# Patient Record
Sex: Male | Born: 1950 | Race: White | Hispanic: No | Marital: Married | Smoking: Former smoker
Health system: Southern US, Community
[De-identification: ages and names within clinical notes are randomized; demographics above are authoritative.]

## PROBLEM LIST (undated history)

## (undated) DIAGNOSIS — IMO0002 Reserved for concepts with insufficient information to code with codable children: Secondary | ICD-10-CM

## (undated) DIAGNOSIS — I2510 Atherosclerotic heart disease of native coronary artery without angina pectoris: Secondary | ICD-10-CM

## (undated) DIAGNOSIS — E785 Hyperlipidemia, unspecified: Secondary | ICD-10-CM

## (undated) DIAGNOSIS — F32A Depression, unspecified: Secondary | ICD-10-CM

## (undated) DIAGNOSIS — G4730 Sleep apnea, unspecified: Secondary | ICD-10-CM

## (undated) DIAGNOSIS — E119 Type 2 diabetes mellitus without complications: Secondary | ICD-10-CM

## (undated) DIAGNOSIS — G2581 Restless legs syndrome: Secondary | ICD-10-CM

## (undated) DIAGNOSIS — M8580 Other specified disorders of bone density and structure, unspecified site: Secondary | ICD-10-CM

## (undated) DIAGNOSIS — F329 Major depressive disorder, single episode, unspecified: Secondary | ICD-10-CM

## (undated) DIAGNOSIS — I714 Abdominal aortic aneurysm, without rupture, unspecified: Secondary | ICD-10-CM

## (undated) DIAGNOSIS — C4491 Basal cell carcinoma of skin, unspecified: Secondary | ICD-10-CM

## (undated) DIAGNOSIS — R1904 Left lower quadrant abdominal swelling, mass and lump: Secondary | ICD-10-CM

## (undated) DIAGNOSIS — I739 Peripheral vascular disease, unspecified: Secondary | ICD-10-CM

## (undated) DIAGNOSIS — K219 Gastro-esophageal reflux disease without esophagitis: Secondary | ICD-10-CM

## (undated) DIAGNOSIS — G709 Myoneural disorder, unspecified: Secondary | ICD-10-CM

## (undated) DIAGNOSIS — K635 Polyp of colon: Secondary | ICD-10-CM

## (undated) HISTORY — DX: Other specified disorders of bone density and structure, unspecified site: M85.80

## (undated) HISTORY — DX: Gastro-esophageal reflux disease without esophagitis: K21.9

## (undated) HISTORY — DX: Atherosclerotic heart disease of native coronary artery without angina pectoris: I25.10

## (undated) HISTORY — DX: Major depressive disorder, single episode, unspecified: F32.9

## (undated) HISTORY — PX: HEMORRHOID SURGERY: SHX153

## (undated) HISTORY — DX: Myoneural disorder, unspecified: G70.9

## (undated) HISTORY — DX: Peripheral vascular disease, unspecified: I73.9

## (undated) HISTORY — DX: Hyperlipidemia, unspecified: E78.5

## (undated) HISTORY — DX: Restless legs syndrome: G25.81

## (undated) HISTORY — PX: NEPHRECTOMY: SHX65

## (undated) HISTORY — DX: Depression, unspecified: F32.A

## (undated) HISTORY — DX: Abdominal aortic aneurysm, without rupture: I71.4

## (undated) HISTORY — DX: Reserved for concepts with insufficient information to code with codable children: IMO0002

## (undated) HISTORY — DX: Type 2 diabetes mellitus without complications: E11.9

## (undated) HISTORY — DX: Sleep apnea, unspecified: G47.30

## (undated) HISTORY — DX: Basal cell carcinoma of skin, unspecified: C44.91

## (undated) HISTORY — DX: Left lower quadrant abdominal swelling, mass and lump: R19.04

## (undated) HISTORY — DX: Polyp of colon: K63.5

## (undated) HISTORY — DX: Abdominal aortic aneurysm, without rupture, unspecified: I71.40

## (undated) HISTORY — PX: ABDOMINAL AORTIC ANEURYSM REPAIR: SUR1152

---

## 2002-01-15 ENCOUNTER — Inpatient Hospital Stay (HOSPITAL_COMMUNITY): Admission: EM | Admit: 2002-01-15 | Discharge: 2002-01-17 | Payer: Self-pay | Admitting: Emergency Medicine

## 2002-01-15 ENCOUNTER — Encounter: Payer: Self-pay | Admitting: Emergency Medicine

## 2002-01-15 ENCOUNTER — Encounter: Payer: Self-pay | Admitting: Family Medicine

## 2002-01-21 ENCOUNTER — Ambulatory Visit (HOSPITAL_COMMUNITY): Admission: RE | Admit: 2002-01-21 | Discharge: 2002-01-21 | Payer: Self-pay | Admitting: Orthopedic Surgery

## 2002-09-08 ENCOUNTER — Ambulatory Visit (HOSPITAL_BASED_OUTPATIENT_CLINIC_OR_DEPARTMENT_OTHER): Admission: RE | Admit: 2002-09-08 | Discharge: 2002-09-08 | Payer: Self-pay | Admitting: Family Medicine

## 2002-10-16 ENCOUNTER — Ambulatory Visit (HOSPITAL_BASED_OUTPATIENT_CLINIC_OR_DEPARTMENT_OTHER): Admission: RE | Admit: 2002-10-16 | Discharge: 2002-10-16 | Payer: Self-pay | Admitting: Family Medicine

## 2009-10-03 DIAGNOSIS — I2510 Atherosclerotic heart disease of native coronary artery without angina pectoris: Secondary | ICD-10-CM

## 2009-10-03 HISTORY — DX: Atherosclerotic heart disease of native coronary artery without angina pectoris: I25.10

## 2009-11-13 ENCOUNTER — Encounter: Admission: RE | Admit: 2009-11-13 | Discharge: 2009-11-13 | Payer: Self-pay | Admitting: Family Medicine

## 2009-11-17 ENCOUNTER — Ambulatory Visit: Payer: Self-pay | Admitting: Vascular Surgery

## 2009-12-01 ENCOUNTER — Encounter: Admission: RE | Admit: 2009-12-01 | Discharge: 2009-12-01 | Payer: Self-pay | Admitting: Vascular Surgery

## 2009-12-01 ENCOUNTER — Ambulatory Visit: Payer: Self-pay | Admitting: Vascular Surgery

## 2009-12-11 ENCOUNTER — Ambulatory Visit (HOSPITAL_COMMUNITY): Admission: RE | Admit: 2009-12-11 | Discharge: 2009-12-11 | Payer: Self-pay | Admitting: Interventional Cardiology

## 2009-12-25 ENCOUNTER — Ambulatory Visit: Payer: Self-pay | Admitting: Vascular Surgery

## 2009-12-25 ENCOUNTER — Encounter: Payer: Self-pay | Admitting: Vascular Surgery

## 2009-12-25 ENCOUNTER — Inpatient Hospital Stay (HOSPITAL_COMMUNITY): Admission: RE | Admit: 2009-12-25 | Discharge: 2009-12-31 | Payer: Self-pay | Admitting: Vascular Surgery

## 2010-01-07 ENCOUNTER — Ambulatory Visit: Payer: Self-pay | Admitting: Vascular Surgery

## 2010-01-18 ENCOUNTER — Ambulatory Visit: Payer: Self-pay | Admitting: Vascular Surgery

## 2010-01-19 ENCOUNTER — Ambulatory Visit: Payer: Self-pay | Admitting: Vascular Surgery

## 2010-03-23 ENCOUNTER — Ambulatory Visit: Payer: Self-pay | Admitting: Vascular Surgery

## 2010-08-19 ENCOUNTER — Ambulatory Visit: Payer: Self-pay | Admitting: Vascular Surgery

## 2010-08-24 ENCOUNTER — Encounter: Admission: RE | Admit: 2010-08-24 | Discharge: 2010-08-24 | Payer: Self-pay | Admitting: Vascular Surgery

## 2010-08-24 ENCOUNTER — Ambulatory Visit: Payer: Self-pay | Admitting: Vascular Surgery

## 2010-09-17 ENCOUNTER — Ambulatory Visit (HOSPITAL_COMMUNITY)
Admission: RE | Admit: 2010-09-17 | Discharge: 2010-09-17 | Payer: Self-pay | Source: Home / Self Care | Attending: Urology | Admitting: Urology

## 2010-10-18 LAB — CBC
HCT: 43.3 % (ref 39.0–52.0)
Hemoglobin: 14 g/dL (ref 13.0–17.0)
MCH: 30.8 pg (ref 26.0–34.0)
MCHC: 32.3 g/dL (ref 30.0–36.0)
MCV: 95.4 fL (ref 78.0–100.0)
Platelets: 200 10*3/uL (ref 150–400)
RBC: 4.54 MIL/uL (ref 4.22–5.81)
RDW: 13.1 % (ref 11.5–15.5)
WBC: 6.5 10*3/uL (ref 4.0–10.5)

## 2010-10-18 LAB — SURGICAL PCR SCREEN
MRSA, PCR: NEGATIVE
Staphylococcus aureus: NEGATIVE

## 2010-10-18 LAB — BASIC METABOLIC PANEL
BUN: 29 mg/dL — ABNORMAL HIGH (ref 6–23)
CO2: 27 mEq/L (ref 19–32)
Calcium: 9.8 mg/dL (ref 8.4–10.5)
Chloride: 105 mEq/L (ref 96–112)
Creatinine, Ser: 1.5 mg/dL (ref 0.4–1.5)
GFR calc Af Amer: 58 mL/min — ABNORMAL LOW (ref >60)
GFR calc non Af Amer: 48 mL/min — ABNORMAL LOW (ref >60)
Glucose, Bld: 103 mg/dL — ABNORMAL HIGH (ref 70–99)
Potassium: 4.8 mEq/L (ref 3.5–5.1)
Sodium: 141 mEq/L (ref 135–145)

## 2010-10-21 ENCOUNTER — Encounter (INDEPENDENT_AMBULATORY_CARE_PROVIDER_SITE_OTHER): Payer: Self-pay | Admitting: Urology

## 2010-10-21 ENCOUNTER — Inpatient Hospital Stay (HOSPITAL_COMMUNITY)
Admission: RE | Admit: 2010-10-21 | Discharge: 2010-10-25 | Payer: Self-pay | Source: Home / Self Care | Attending: Urology | Admitting: Urology

## 2010-10-24 NOTE — Op Note (Signed)
NAME:  Allen Cortez, Allen Cortez NO.:  [id number]  MEDICAL RECORD NO.:  [id number]          PATIENT TYPE:  INP  LOCATION:  [id number]                         FACILITY:  [facility]  PHYSICIAN:  Raynelle Bring, MD      DATE OF BIRTH:  1950-10-11  DATE OF PROCEDURE:  10/21/2010 DATE OF DISCHARGE:                              OPERATIVE REPORT   PREOPERATIVE DIAGNOSIS:  Left renal neoplasm.  POSTOPERATIVE DIAGNOSIS:  Left renal neoplasm.  PROCEDURE:  Left open partial nephrectomy.  SURGEON:  Raynelle Bring, MD  ASSISTANT:  Franco Collet, [facility]  ANESTHESIA:  General.  COMPLICATIONS:  None.  ESTIMATED BLOOD LOSS:  200 cc.  INTRAVENOUS FLUIDS:  3800 cc of crystalloid and 500 cc of colloid.  COMPLICATIONS:  None.  INTRAOPERATIVE FINDINGS: 1. Intraoperative renal tumor margins were negative for malignancy. 2. Cold ischemia time was 30 minutes.  DRAINS: 1. A #15 Blake perinephric drain. 2. A 14-French Foley catheter.  INDICATIONS:  Mr. Bech is a 60 year old gentleman who was incidentally found to have an enhancing left renal neoplasm measuring 1.8 cm concerning for renal malignancy.  He did undergo metastatic evaluation and was found to have multiple small less than 1 cm nodules bilaterally within the lungs.  These findings were discussed with Interventional Radiology who felt that a biopsy was not able to be safely performed. He did not have any evidence of another primary malignancy and therefore was felt that we would proceed to treat his primary renal tumor with plans to follow up his pulmonary nodules.  The potential risks, complications, alternative treatment options were discussed in detail and informed consent was obtained.  DESCRIPTION OF PROCEDURE:  The patient was taken to the operating room and a general anesthetic was administered.  In addition, an epidural was placed for postoperative pain control.  He was placed in the left flank position and prepped and  draped in the usual sterile fashion.  Care was taken to pad all potential pressure points.  He was administered preoperative antibiotics and a preoperative time-out was performed.  An incision was then made from the lateral aspect of the left rectus muscle extending over the eleventh rib to the posterior axillary line and carried down through the subcutaneous tissues.  The incision was carried down over the eleventh rib and the overlying muscle was cleared off the periosteum with the [facility].  A Doyen was then able to be placed underneath the rib and the underlying neurovascular bundle and tissue was removed from the rib.  A rib cutter was then used to remove the distal tip of the rib and the edge of the rib was filed down to a rounded edge.  The overlying abdominal muscular layers were then taken down with electrocautery and the perinephric fat was identified and incised sharply with a knife allowing entry into the retroperitoneum.  The posterior plane was developed off the psoas muscle with combination of blunt and sharp dissection with care to cauterize any small vessels.  The superior pole of the kidney was freed away from the spleen and the peritoneum was carefully pushed away from the anterior aspect of  the kidney, thereby allowing the kidney to be freed. A self-retaining Bookwalter retractor was then placed.  The ureter was identified inferiorly and a vessel loop was placed around for identification purposes.  There were noted to be multiple renal vessels including the small very lower pole renal vessel extending just below the renal pelvis.  This was preserved.  In addition, there were 2 anteriorly located renal arteries and posteriorly located renal artery which were able to all be isolated.  There did appear to be 2 renal veins which were also identified.  Based on the number of renal vessels, it was decided to perform en bloc renal hilar clamping during  the partial nephrectomy.  Gerota's fascia was incised and the perinephric fat was removed from the renal capsule.  The patient's renal tumor was easily identified and was exophytic extending off the posterolateral aspect of the interpolar region of the kidney.  A 12.5 mg of intravenous mannitol was administered and preparations were made for renal hilar clamping.  Once the mannitol had been in appropriate amount of time, a Satinsky clamp was placed over the renal hilum and the kidney was separated from the surrounding tissues with a bowel bag.  Ice slush was then placed around the kidney and the kidney was cooled for 10 minutes. At this point, the ice slush was removed from around the renal tumor and using Metzenbaum scissors, the renal tumor was excised along with a surrounding margin.  Once the tumor was removed, the underlying surface was examined and grossly the margin appeared to be negative.  The additional representative renal parenchymal margins were then sent for pathologic analysis and subsequently returned negative for malignancy. Multiple 4-0 Vicryl figure-of-eight sutures were then placed to control any small renal vessels.  A 2-0 Vicryl running suture was then used to begin to provide renal compression in for additional hemostatic purposes, taking care to ligate any arcuate arteries.  Surgiflo was then placed into the renal tumor defect and a Surgicel bolster was placed into the defect.  This was then able to be secured with 3 separate 2-0 Monocryl renal capsular sutures providing renal compression and securing the Surgicel bolster.  At this point, hemostasis appeared excellent. The renal hilar clamp was removed and hemostasis remained excellent. Total cold ischemia time was 30 minutes.  The renal fossa and the retroperitoneum were then carefully examined and hemostasis was ensured. The retroperitoneum was then carefully irrigated with saline.  A #15 Blake drain was then  brought through a separate stab incision in the left abdominal wall and positioned in the perinephric space.  It was secured to the skin with a nylon suture.  The abdominal wall was then closed in 2 layers with a running #1 PDS suture used to close the lower 2 layers and a separate #1 PDS suture placed to close the external oblique layer.  The superficial wound was then copiously irrigated and 1% lidocaine with epinephrine was used as a local anesthetic in the wound.  The skin was then reapproximated with staples and sterile dressings were applied.  He tolerated the procedure well and without complications.  He was able to be extubated and transferred to recovery unit in satisfactory condition.    Raynelle Bring, MD    LB/MEDQ  D:  10/21/2010  T:  10/21/2010  Job:  [id number]  Electronically Signed by Raynelle Bring MD on 10/24/2010 05:27:42 PM

## 2010-10-25 LAB — BASIC METABOLIC PANEL
BUN: 21 mg/dL (ref 6–23)
BUN: 19 mg/dL (ref 6–23)
CO2: 27 mEq/L (ref 19–32)
CO2: 30 mEq/L (ref 19–32)
Calcium: 8.2 mg/dL — ABNORMAL LOW (ref 8.4–10.5)
Calcium: 8.4 mg/dL (ref 8.4–10.5)
Chloride: 103 mEq/L (ref 96–112)
Chloride: 101 mEq/L (ref 96–112)
Creatinine, Ser: 1.5 mg/dL (ref 0.4–1.5)
Creatinine, Ser: 1.54 mg/dL — ABNORMAL HIGH (ref 0.4–1.5)
GFR calc Af Amer: 58 mL/min — ABNORMAL LOW (ref >60)
GFR calc Af Amer: 56 mL/min — ABNORMAL LOW (ref >60)
GFR calc non Af Amer: 48 mL/min — ABNORMAL LOW (ref >60)
GFR calc non Af Amer: 46 mL/min — ABNORMAL LOW (ref >60)
Glucose, Bld: 147 mg/dL — ABNORMAL HIGH (ref 70–99)
Glucose, Bld: 117 mg/dL — ABNORMAL HIGH (ref 70–99)
Potassium: 4.7 mEq/L (ref 3.5–5.1)
Potassium: 4.6 mEq/L (ref 3.5–5.1)
Sodium: 135 mEq/L (ref 135–145)
Sodium: 138 mEq/L (ref 135–145)

## 2010-10-25 LAB — HEMOGLOBIN AND HEMATOCRIT, BLOOD
HCT: 34.8 % — ABNORMAL LOW (ref 39.0–52.0)
HCT: 36.9 % — ABNORMAL LOW (ref 39.0–52.0)
Hemoglobin: 11.2 g/dL — ABNORMAL LOW (ref 13.0–17.0)
Hemoglobin: 11.9 g/dL — ABNORMAL LOW (ref 13.0–17.0)

## 2010-10-25 LAB — GLUCOSE, CAPILLARY
Glucose-Capillary: 120 mg/dL — ABNORMAL HIGH (ref 70–99)
Glucose-Capillary: 137 mg/dL — ABNORMAL HIGH (ref 70–99)
Glucose-Capillary: 96 mg/dL (ref 70–99)
Glucose-Capillary: 115 mg/dL — ABNORMAL HIGH (ref 70–99)
Glucose-Capillary: 122 mg/dL — ABNORMAL HIGH (ref 70–99)
Glucose-Capillary: 124 mg/dL — ABNORMAL HIGH (ref 70–99)
Glucose-Capillary: 125 mg/dL — ABNORMAL HIGH (ref 70–99)
Glucose-Capillary: 130 mg/dL — ABNORMAL HIGH (ref 70–99)

## 2010-10-25 LAB — CARDIAC PANEL(CRET KIN+CKTOT+MB+TROPI)
CK, MB: 2.5 ng/mL (ref 0.3–4.0)
Relative Index: 0.3 (ref 0.0–2.5)
Total CK: 812 U/L — ABNORMAL HIGH (ref 7–232)
Total CK: 1038 U/L — ABNORMAL HIGH (ref 7–232)
Troponin I: 0.01 ng/mL (ref 0.00–0.06)
Troponin I: 0.02 ng/mL (ref 0.00–0.06)

## 2010-10-25 LAB — TYPE AND SCREEN
ABO/RH(D): O POS
Antibody Screen: NEGATIVE

## 2010-10-25 LAB — ABO/RH: ABO/RH(D): O POS

## 2010-10-26 LAB — GLUCOSE, CAPILLARY
Glucose-Capillary: 85 mg/dL (ref 70–99)
Glucose-Capillary: 95 mg/dL (ref 70–99)
Glucose-Capillary: 96 mg/dL (ref 70–99)
Glucose-Capillary: 101 mg/dL — ABNORMAL HIGH (ref 70–99)
Glucose-Capillary: 115 mg/dL — ABNORMAL HIGH (ref 70–99)

## 2010-10-26 LAB — CARDIAC PANEL(CRET KIN+CKTOT+MB+TROPI)
CK, MB: 1.5 ng/mL (ref 0.3–4.0)
Relative Index: 0.1 (ref 0.0–2.5)
Total CK: 1177 U/L — ABNORMAL HIGH (ref 7–232)

## 2010-10-26 LAB — BASIC METABOLIC PANEL
BUN: 16 mg/dL (ref 6–23)
CO2: 26 mEq/L (ref 19–32)
CO2: 28 mEq/L (ref 19–32)
Calcium: 8.3 mg/dL — ABNORMAL LOW (ref 8.4–10.5)
Calcium: 8.6 mg/dL (ref 8.4–10.5)
Chloride: 105 mEq/L (ref 96–112)
Chloride: 107 mEq/L (ref 96–112)
Creatinine, Ser: 1.45 mg/dL (ref 0.4–1.5)
Creatinine, Ser: 1.44 mg/dL (ref 0.4–1.5)
GFR calc non Af Amer: 49 mL/min — ABNORMAL LOW (ref >60)
Glucose, Bld: 89 mg/dL (ref 70–99)
Glucose, Bld: 115 mg/dL — ABNORMAL HIGH (ref 70–99)
Potassium: 4.2 mEq/L (ref 3.5–5.1)
Sodium: 141 mEq/L (ref 135–145)

## 2010-10-26 LAB — CREATININE, FLUID (PLEURAL, PERITONEAL, JP DRAINAGE)

## 2010-11-27 NOTE — Discharge Summary (Signed)
NAME:  Allen Cortez, Allen Cortez NO.:  [id number]  MEDICAL RECORD NO.:  [id number]          PATIENT TYPE:  INP  LOCATION:  [id number]                         FACILITY:  [facility]  PHYSICIAN:  Raynelle Bring, MD      DATE OF BIRTH:  [birth year]  DATE OF ADMISSION:  10/21/2010 DATE OF DISCHARGE:  10/25/2010                              DISCHARGE SUMMARY   ADMISSION DIAGNOSIS:  Left renal neoplasm.  DISCHARGE DIAGNOSIS:  Left renal cell carcinoma, clear-cell type, furhman grade II/IV, pT1, pNX.  PROCEDURE:  Left open partial nephrectomy.  HISTORY AND PHYSICAL:  For full details, please see admission history and physical.  Briefly, Mr. Wing is a 60 year old gentleman who was incidentally found to have an enhancing left renal neoplasm which measured 1.8 cm, concerning for renal malignancy.  He did undergo metastatic evaluation and was found to have multiple small lesions, less than 1 cm nodules bilaterally within the lungs.  These findings were discussed with interventional radiology who fell that a biopsy was not able to be safely performed.  He did not have any evidence of another primary malignancy and, therefore, it was felt that we would proceed to treat his primary renal tumor with plans to follow up with his pulmonary nodules.  After discussing management options for treatment, he elected to proceed with surgical therapy and an open left partial nephrectomy.  HOSPITAL COURSE:  On October 21, 2010, he was taken to the operating room where he underwent the above named procedure which he tolerated well and without complications.  Postoperatively, he was able to be transferred to a regular hospital room following recovery from anesthesia.  He was found to be hemodynamically stable as noted by postoperative hemoglobin of 11.2.  He was also found to be hemodynamically stable on postoperative day #1 as noted by hemoglobin of 11.9.  His serum creatinine was found to be stable  postoperatively at 1.50.  It did slightly increase to 1.54 on postoperative day #1 but subsequently decreased to 1.44 by postoperative day #2.  On postoperative day #1, he was found to have several episodes of nausea and vomiting with complaints of heartburn.  He did have a history of peripheral vascular disease and an abdominal aortic aneurysm.  He denied any complaints of chest pain or shortness of breath but a set of cardiac enzymes and troponin as well as a 12-lead EKG were obtained.  His 12- lead EKG was normal as well as his cardiac enzymes.  His CK was elevated slightly but this was felt to be secondary to his procedure.  He had no further episodes of nausea and vomiting.  On postoperative day #1, he was able to be given clear liquids.  He was able to be transitioned to regular diet over the course of the next 24-48 hours.  Throughout his hospitalization, he was found to have excellent urine output.  On postoperative day #3, he was also found to have low output from his perinephric drain, therefore, fluid was sent from his perinephric drain to check for creatinine and found to be consistent with serum at 1.4. Therefore his perinephric drain was removed.  He had an epidural catheter placed for pain control postoperatively.  By postoperative day #3, his pain was well managed,  therefore, this after epidural catheter was removed per anesthesia.  He was found to be urinating without difficulty afterwards, therefore, his Foley catheter was removed.  On postoperative day #4, he was ambulating without difficulty.  His pain was well managed and he was tolerating a regular diet.  Therefore. He was felt stable for discharge home as he had met all discharge criteria.  DISPOSITION:  Home.  DISCHARGE INSTRUCTIONS:  He was instructed to be ambulatory but specifically told to refrain from any heavy lifting, strenuous activity or driving.  He was instructed to resume a regular diet.  DISCHARGE  MEDICATIONS:  He was instructed to resume all home medications consisting of Prilosec, Celexa, simvastatin, lisinopril and Klonopin. He was instructed to hold his multivitamins and aspirin for 1 week.  In addition, he was provided a prescription for Vicodin to take as needed for pain and told to use Colace as a stool softener.  FOLLOWUP:  He will follow up in 10-14 days for further postoperative evaluation.  PATHOLOGY:  His pathology returned as a renal cell carcinoma, clear-cell type, firm and grade II/IV, pT1, pNX.  Dictated For:  Raynelle Bring, MD     Franco Collet, NP   ______________________________ Raynelle Bring, MD    MA/MEDQ  D:  11/01/2010  T:  11/01/2010  Job:  [id number]  Electronically Signed by Franco Collet NP on 11/21/2010 08:18:32 PM Electronically Signed by Raynelle Bring MD on 11/27/2010 01:16:31 PM

## 2010-12-26 LAB — GLUCOSE, CAPILLARY
Glucose-Capillary: 106 mg/dL — ABNORMAL HIGH (ref 70–99)
Glucose-Capillary: 106 mg/dL — ABNORMAL HIGH (ref 70–99)
Glucose-Capillary: 108 mg/dL — ABNORMAL HIGH (ref 70–99)
Glucose-Capillary: 108 mg/dL — ABNORMAL HIGH (ref 70–99)
Glucose-Capillary: 109 mg/dL — ABNORMAL HIGH (ref 70–99)
Glucose-Capillary: 118 mg/dL — ABNORMAL HIGH (ref 70–99)
Glucose-Capillary: 127 mg/dL — ABNORMAL HIGH (ref 70–99)
Glucose-Capillary: 138 mg/dL — ABNORMAL HIGH (ref 70–99)
Glucose-Capillary: 142 mg/dL — ABNORMAL HIGH (ref 70–99)
Glucose-Capillary: 154 mg/dL — ABNORMAL HIGH (ref 70–99)
Glucose-Capillary: 158 mg/dL — ABNORMAL HIGH (ref 70–99)

## 2010-12-26 LAB — CBC
HCT: 30.3 % — ABNORMAL LOW (ref 39.0–52.0)
HCT: 32.7 % — ABNORMAL LOW (ref 39.0–52.0)
HCT: 33.9 % — ABNORMAL LOW (ref 39.0–52.0)
HCT: 41.8 % (ref 39.0–52.0)
Hemoglobin: 10.2 g/dL — ABNORMAL LOW (ref 13.0–17.0)
Hemoglobin: 11 g/dL — ABNORMAL LOW (ref 13.0–17.0)
MCHC: 33.2 g/dL (ref 30.0–36.0)
MCHC: 33.5 g/dL (ref 30.0–36.0)
MCHC: 34 g/dL (ref 30.0–36.0)
MCHC: 34 g/dL (ref 30.0–36.0)
MCV: 91.9 fL (ref 78.0–100.0)
MCV: 91.9 fL (ref 78.0–100.0)
MCV: 92.5 fL (ref 78.0–100.0)
MCV: 92.5 fL (ref 78.0–100.0)
Platelets: 133 10*3/uL — ABNORMAL LOW (ref 150–400)
Platelets: 145 10*3/uL — ABNORMAL LOW (ref 150–400)
RBC: 3.27 MIL/uL — ABNORMAL LOW (ref 4.22–5.81)
RBC: 3.69 MIL/uL — ABNORMAL LOW (ref 4.22–5.81)
RBC: 4.55 MIL/uL (ref 4.22–5.81)
RDW: 13.6 % (ref 11.5–15.5)
RDW: 13.6 % (ref 11.5–15.5)
WBC: 11.4 10*3/uL — ABNORMAL HIGH (ref 4.0–10.5)
WBC: 6.9 10*3/uL (ref 4.0–10.5)

## 2010-12-26 LAB — URINALYSIS, ROUTINE W REFLEX MICROSCOPIC
Bilirubin Urine: NEGATIVE
Glucose, UA: NEGATIVE mg/dL
Hgb urine dipstick: NEGATIVE
Specific Gravity, Urine: 1.013 (ref 1.005–1.030)
Urobilinogen, UA: 0.2 mg/dL (ref 0.0–1.0)

## 2010-12-26 LAB — BLOOD GAS, ARTERIAL
Acid-Base Excess: 1 mmol/L (ref 0.0–2.0)
Bicarbonate: 24.5 mEq/L — ABNORMAL HIGH (ref 20.0–24.0)
Bicarbonate: 27.1 mEq/L — ABNORMAL HIGH (ref 20.0–24.0)
Patient temperature: 98.6
Patient temperature: 98.6
TCO2: 25.8 mmol/L (ref 0–100)
TCO2: 28.9 mmol/L (ref 0–100)
pH, Arterial: 7.403 (ref 7.350–7.450)
pO2, Arterial: 86.5 mmHg (ref 80.0–100.0)

## 2010-12-26 LAB — AMYLASE: Amylase: 103 U/L (ref 0–105)

## 2010-12-26 LAB — COMPREHENSIVE METABOLIC PANEL
ALT: 10 U/L (ref 0–53)
AST: 19 U/L (ref 0–37)
AST: 19 U/L (ref 0–37)
Albumin: 2.5 g/dL — ABNORMAL LOW (ref 3.5–5.2)
BUN: 18 mg/dL (ref 6–23)
CO2: 23 mEq/L (ref 19–32)
Calcium: 7.8 mg/dL — ABNORMAL LOW (ref 8.4–10.5)
Calcium: 9.6 mg/dL (ref 8.4–10.5)
Chloride: 110 mEq/L (ref 96–112)
Creatinine, Ser: 1.28 mg/dL (ref 0.4–1.5)
Creatinine, Ser: 1.3 mg/dL (ref 0.4–1.5)
GFR calc Af Amer: >60 mL/min (ref >60)
GFR calc Af Amer: >60 mL/min (ref >60)
GFR calc non Af Amer: 56 mL/min — ABNORMAL LOW (ref >60)
GFR calc non Af Amer: 57 mL/min — ABNORMAL LOW (ref >60)
Glucose, Bld: 114 mg/dL — ABNORMAL HIGH (ref 70–99)
Sodium: 136 mEq/L (ref 135–145)
Total Bilirubin: 0.4 mg/dL (ref 0.3–1.2)
Total Protein: 4.5 g/dL — ABNORMAL LOW (ref 6.0–8.3)

## 2010-12-26 LAB — BASIC METABOLIC PANEL
BUN: 13 mg/dL (ref 6–23)
BUN: 16 mg/dL (ref 6–23)
CO2: 26 mEq/L (ref 19–32)
CO2: 28 mEq/L (ref 19–32)
Calcium: 8 mg/dL — ABNORMAL LOW (ref 8.4–10.5)
Chloride: 107 mEq/L (ref 96–112)
Chloride: 109 mEq/L (ref 96–112)
Creatinine, Ser: 1.13 mg/dL (ref 0.4–1.5)
GFR calc Af Amer: >60 mL/min (ref >60)
GFR calc Af Amer: >60 mL/min (ref >60)
Glucose, Bld: 145 mg/dL — ABNORMAL HIGH (ref 70–99)
Potassium: 3.9 mEq/L (ref 3.5–5.1)
Potassium: 4.2 mEq/L (ref 3.5–5.1)
Sodium: 137 mEq/L (ref 135–145)

## 2010-12-26 LAB — ABO/RH: ABO/RH(D): O POS

## 2010-12-26 LAB — POCT I-STAT 7, (LYTES, BLD GAS, ICA,H+H)
Calcium, Ion: 1.12 mmol/L (ref 1.12–1.32)
HCT: 30 % — ABNORMAL LOW (ref 39.0–52.0)
Patient temperature: 36.2
Potassium: 4.2 mEq/L (ref 3.5–5.1)
pCO2 arterial: 37.6 mmHg (ref 35.0–45.0)
pH, Arterial: 7.448 (ref 7.350–7.450)
pO2, Arterial: 359 mmHg — ABNORMAL HIGH (ref 80.0–100.0)

## 2010-12-26 LAB — PROTIME-INR
INR: 0.97 (ref 0.00–1.49)
Prothrombin Time: 15.3 seconds — ABNORMAL HIGH (ref 11.6–15.2)

## 2010-12-26 LAB — CROSSMATCH
ABO/RH(D): O POS
Antibody Screen: NEGATIVE

## 2010-12-26 LAB — MRSA PCR SCREENING: MRSA by PCR: NEGATIVE

## 2011-01-19 ENCOUNTER — Other Ambulatory Visit: Payer: Self-pay | Admitting: Family Medicine

## 2011-01-19 DIAGNOSIS — R51 Headache: Secondary | ICD-10-CM

## 2011-01-19 DIAGNOSIS — H538 Other visual disturbances: Secondary | ICD-10-CM

## 2011-01-20 ENCOUNTER — Ambulatory Visit
Admission: RE | Admit: 2011-01-20 | Discharge: 2011-01-20 | Disposition: A | Discharge status: Home / Self Care | Payer: PRIVATE HEALTH INSURANCE | Source: Ambulatory Visit | Attending: Family Medicine | Admitting: Family Medicine

## 2011-01-20 DIAGNOSIS — H538 Other visual disturbances: Secondary | ICD-10-CM

## 2011-01-20 DIAGNOSIS — R51 Headache: Secondary | ICD-10-CM

## 2011-02-15 NOTE — Assessment & Plan Note (Signed)
OFFICE VISIT   [name], [name]  DOB:  Sep 12, 1951                                       08/24/2010  [id number]   The patient returns today having had a CT angiogram performed today to  rule out a ventral hernia because of some lower abdominal discomfort.  I  performed an aortobi-iliac graft on him in March 2011 for an abdominal  aortic aneurysm.  He has also been having some lower back discomfort.  He states that since wearing the abdominal binder, his abdominal  symptoms have eased up while at work.   On exam today blood pressure 146/91, heart rate is 83, respirations 16.  Chest s clear to auscultation.  Cardiovascular:  Regular rhythm, no  murmurs.  Abdomen is soft, nontender.  There is no evidence of ventral  hernia on physical exam.  He does have some tenderness around the  periumbilical region.  He has 3+ femoral pulses bilaterally.   Today I reviewed the CT scan and discussed it with Dr. Chauncey Cruel by  phone.  The patient has a mass in his left kidney approximately 1.5 cm  in diameter, suspicious for malignancy.  He also has a low-attenuated  area in the right lobe of the liver measuring 1.2 cm in diameter, the  etiology of which is unknown.  He will need an MR scan with attenuation  most likely, and we are referring him to [facility] for further  evaluation of this left renal mass and possible liver mass.  There is no  evidence of ventral hernia and I discussed this with him, and he will  return to see [place] on a p.r.n. basis.     Nelda Severe Kellie Simmering, M.D.  Electronically Signed   JDL/MEDQ  D:  08/24/2010  T:  08/25/2010  Job:  [id number]

## 2011-02-15 NOTE — Assessment & Plan Note (Signed)
OFFICE VISIT   OCTAVE, WOODROME  DOB:  1951/05/03                                       12/01/2009  [id number]   The patient returns today to further discuss his infrarenal abdominal  aortic aneurysm which I an in the process of evaluating having seen him  on February 15.  The aneurysm is 5.5 x 5.6.  I ordered a CT angiogram  which I reviewed today.  This reveals the infrarenal aneurysm to extend  up to just below the left and right renal arteries.  There is a 90  degree angulation to the patient's left side and he is not a good  candidate for stent grafting.  The patient also does not request stent  graft and would like an open repair.  He has also had a Cardiolite  procedure performed.  This revealed some mild ischemic changes.  I  discussed this with Dr. Leonia Reeves today and the patient has an appointment  with Dr. Irish Lack on this coming Monday to discuss cardiac  catheterization preoperatively to rule out any significant lesions prior  to his open aneurysm resection.   Today on exam blood pressure is 153/93, heart rate is 85, respirations  14.  His abdomen is soft, nontender with a pulsatile mass approximating  5.5 cm.  He has 3+ femoral and posterior tibial pulses bilaterally.   The patient will be back in touch with me as soon as the cardiac cath  has been performed so that we can then make decision regarding open  resection and grafting of this aneurysm with aortoiliac bypass grafting.     Nelda Severe Kellie Simmering, M.D.  Electronically Signed   JDL/MEDQ  D:  12/01/2009  T:  12/02/2009  Job:  [id number]

## 2011-02-15 NOTE — Assessment & Plan Note (Signed)
OFFICE VISIT   Allen Cortez, Allen Cortez  DOB:  Dec 14, 1950                                       03/23/2010  [id number]   The patient returns for continued follow-up regarding abdominal aortic  aneurysm resection with insertion of aortobifemoral bypass graft  performed by me on March 25.  He has done very well over the last 2  months with improvement in his appetite and he is beginning to gain his  preoperative weight back.  He is ambulating without claudication having  good bowel movements and has no specific complaints.   On exam his blood pressure today is 166/89, heart rate 67, temperature  98.  Abdominal incision is well-healed.  No evidence of ventral hernia.  Inguinal incisions are healed with 3+ femoral, popliteal and posterior  tibial pulses bilaterally.  Both feet are well-perfused.   I think he is doing very well.  It is okay to return to work as of  Monday the 27th of June.  He will return to see [place] on a p.r.n. basis.     Nelda Severe Kellie Simmering, M.D.  Electronically Signed   JDL/MEDQ  D:  03/23/2010  T:  03/23/2010  Job:  [id number]

## 2011-02-15 NOTE — Assessment & Plan Note (Signed)
OFFICE VISIT   RAIMUNDO, SCHOMBERG  DOB:  04-23-1951                                       08/19/2010  [id number]   HISTORY OF PRESENT ILLNESS:  Patient is a 60 year old gentleman who had  an open repair of infrarenal abdominal aortic aneurysm in March 2011.  Since that time, he has noted some pain and burning around the umbilical  area near the incision of the abdominal wound.  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  He has been lifting at least 50-75 pounds several  times a day.  He notes sharp pain with turning.  He notes burning in the  area.  He did not note any increasing fullness to the area or constant  pain.  He states over the last several weeks with increasing his work  load, he has been having more constant pain but denies any enlargement  of the area of the wound.  He states he has most of his pain with  movement and coughing.  Otherwise he has no new medical issues and is on  no new medications.   PAST MEDICAL HISTORY:  Significant for GERD, coronary artery disease,  hypercholesterolemia, and depression.   PHYSICAL EXAMINATION:  This is a well-developed, well-nourished  gentleman in no acute distress.  His heart rate is 86.  His O2 sats were  99%.  His respiratory is 10.  Abdomen is soft.  He is tender around the  umbilical area to palpation.  No masses are felt.  There is nothing  reducible in that area.  At the base of the umbilicus, there is an area  the size of a nickel, which may be dehiscence of the fascial layer, but  it is small.  There is no nothing protruding through this area.  The  abdominal wound is well-healed.  There is no redness or other issues  with the wound.   ASSESSMENT:  1. Possible incisional hernia in the midline abdominal incision near      the umbilicus.  2. Muscle pain secondary to heavy lifting in the area around a midline      abdominal incision.   PLAN:  1. This was discussed with Dr. Oneida Alar, and  it was felt that a CT scan      to rule out hernia and then follow up with Dr. Kellie Simmering.  2. The patient is to wear an abdominal binder to assist with support      of the abdominal wall while he is working.   Wray Kearns, PA-C   Gianno E. Fields, MD  Electronically Signed   RR/MEDQ  D:  08/19/2010  T:  08/19/2010  Job:  [id number]

## 2011-02-15 NOTE — Consult Note (Signed)
NEW PATIENT CONSULTATION   Allen Cortez, Allen Cortez  DOB:  1951/02/06                                       11/17/2009  [id number]   The patient is a 60 year old male patient referred by Dr. Azalia Bilis  for an infrarenal abdominal aortic aneurysm which was discovered last  week on a lifeline screening exam.  He had a repeat formal ultrasound  study performed which revealed a 5.5 x 5.6 cm infrarenal aortic  aneurysm.  He has no history of abdominal or back symptoms other than  chronic mechanical back pain and had no previous knowledge of the  aneurysm.   CHRONIC STABLE MEDICAL PROBLEMS:  1. Hypertension.  2. Hyperlipidemia.  3. Pre diabetes.  4. Negative for coronary artery disease, CVA or COPD.   PAST SURGICAL HISTORY:  1. Hemorrhoidectomy.  2. Vasectomy.   FAMILY HISTORY:  Positive for diabetes and cerebral aneurysm in his  mother.  Negative for coronary artery disease.   SOCIAL HISTORY:  He is married, has one child, works as a Games developer.  Has smoked 1-2 packs cigarettes per day for 40+ years and currently  smokes 1/2 pack cigarettes per  day and is trying to quit.  Does not use  alcohol.   REVIEW OF SYSTEMS:  Negative for chest pain, dyspnea on exertion, has  reflux esophagitis on occasion.  No severe claudication.  Has had some  left frontal headaches with no neurologic symptoms.  Has arthritis,  muscle pain, depression.  All other systems in the 14 point review of  systems are negative.   PHYSICAL EXAMINATION:  Vital signs:  Blood pressure 130/88, heart rate  70, temperature 98.  General:  He is a well-developed, well-nourished  male who is in no apparent distress, alert and oriented x3.  HEENT:  EOMs intact, conjunctivae normal.  Chest:  Clear to auscultation.  No  wheezing.  Cardiovascular:  Regular rhythm.  No murmurs.  Cardiovascular  exam also includes 3+ carotid pulses with no audible bruits in the neck.  No palpable adenopathy in the  neck.  Abdomen:  Soft, nontender with a  pulsatile mass in mid epigastric approximating 5-6 cm.  This is  nontender.  He has 3+ femoral, popliteal and dorsalis pedis pulses  bilaterally.  Musculoskeletal:  No major deformities.  Skin:  Free of  rashes.   I reviewed the reports of the ultrasound study and also the clinical  information supplied by Dr. Kenton Kingfisher.  I also discussed with the patient  options including stent grafting and open repair.  He has researched  this and states that he would definitely like to have open repair.   I have ordered a Cardiolite exam to rule out silent coronary artery  disease and also a CT angiogram and he will return in 2 weeks to discuss  this.  I will then give him the specific options of stent grafting and  open repair if stent grafting is an option and he can make final  decision.     Nelda Severe Kellie Simmering, M.D.  Electronically Signed   JDL/MEDQ  D:  11/17/2009  T:  11/18/2009  Job:  [id number]   cc:   Azalia Bilis, M.D.

## 2011-02-15 NOTE — Assessment & Plan Note (Signed)
OFFICE VISIT   Allen Cortez, Allen Cortez  DOB:  01-15-1951                                       01/19/2010  [id number]   The patient had abdominal aortic aneurysm resection performed on March  25.  He did have some problems postoperatively with paranoia and  confusion which was clearing at the time of discharge.  Since his  discharge he has gradually increased his activity level but is not  ambulating much outside.  His appetite is improving and his weight seems  to have stabilized.  He is having no nausea and vomiting.  His abdominal  soreness is also improving.  His mental status has completely cleared.   On exam today blood pressure 153/92, heart rate 69, temperature 97.8.  Abdominal incision is well-healed.  No evidence of ventral hernia.  He  has 3+ femoral, popliteal and posterior tibial pulses, well-perfused  lower extremities with no edema.   I have reassured him regarding these findings.  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     Nelda Severe Kellie Simmering, M.D.  Electronically Signed   JDL/MEDQ  D:  01/19/2010  T:  01/20/2010  Job:  [id number]

## 2011-03-01 IMAGING — CT CT ANGIO PELVIS
2 of 4 series · 12 of 36 positions shown, 17 images · IV contrast ([ID] [id number])
Comparison: Ultrasound of the abdomen of 11/13/2009

CTA ABDOMEN

CLINICAL DATA: Abdominal aortic aneurysm, for pre stent graft
measurements

CT ANGIOGRAPHY OF ABDOMEN AND PELVIS - PRESTENT PROTOCOL
TECHNIQUE: Multidetector CT imaging of the abdomen and pelvis was
performed during bolus injection of intravenous contrast.
Multiplanar CT angiographic image reconstructions including MIPs
were also generated to evaluate the vascular anatomy.
Contrast:  100 ml 5mnipaque-RII

[Series 5: angio · axial · 0.70mm/px · z∈[-430,-40]mm · 11 of 180 slices shown, 15 images]
[im 12/180  soft-tissue]
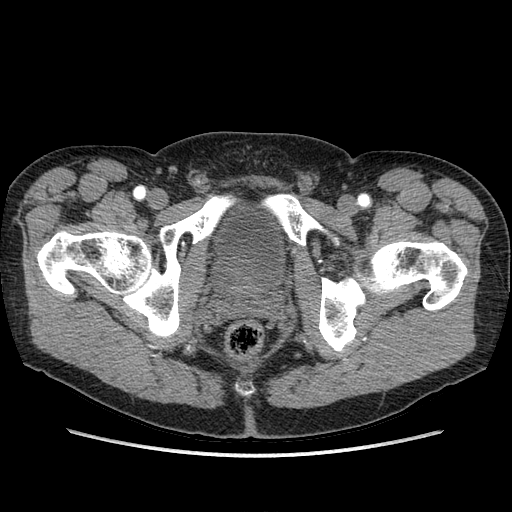
[im 12/180  bone]
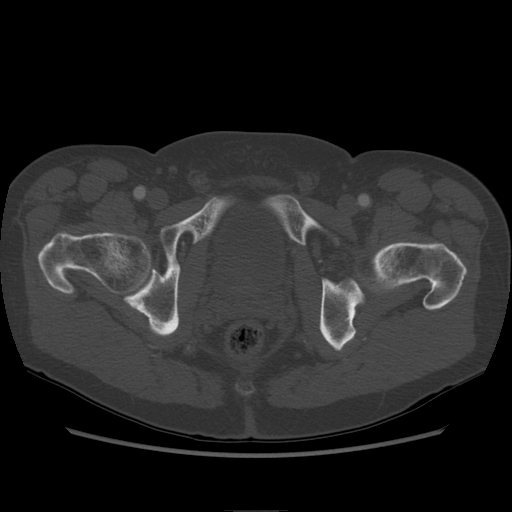
[im 34/180  soft-tissue]
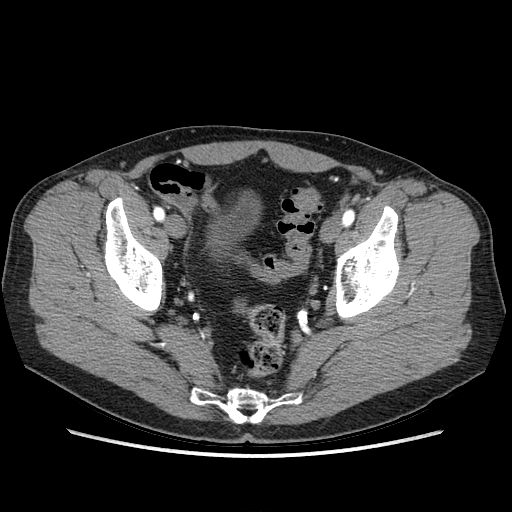
[im 56/180  soft-tissue]
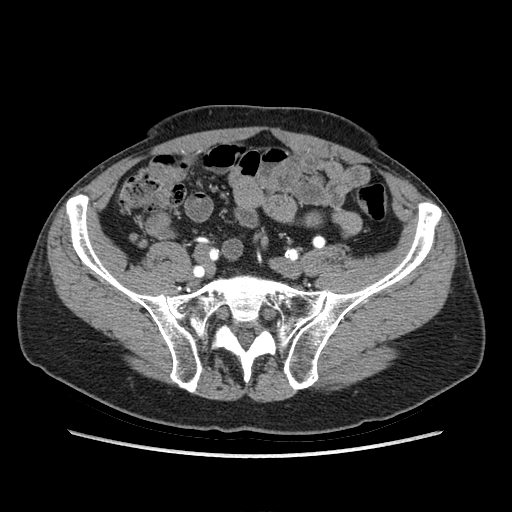
[im 68/180  soft-tissue]
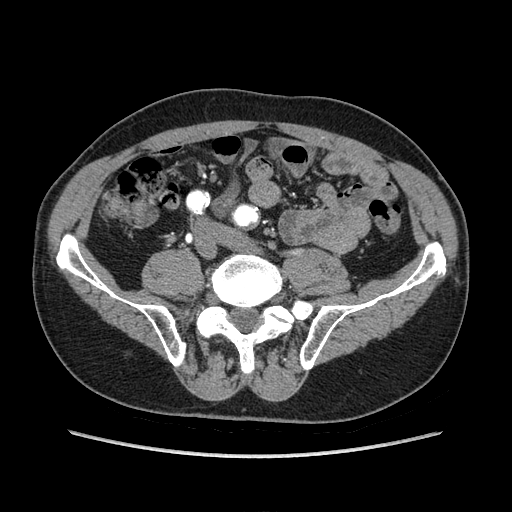
[im 90/180  soft-tissue]
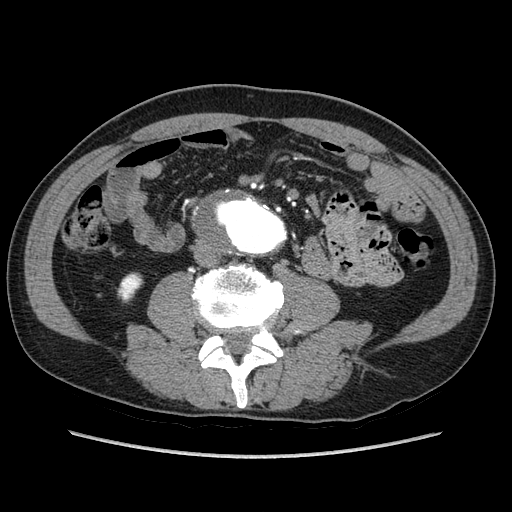
[im 112/180  soft-tissue]
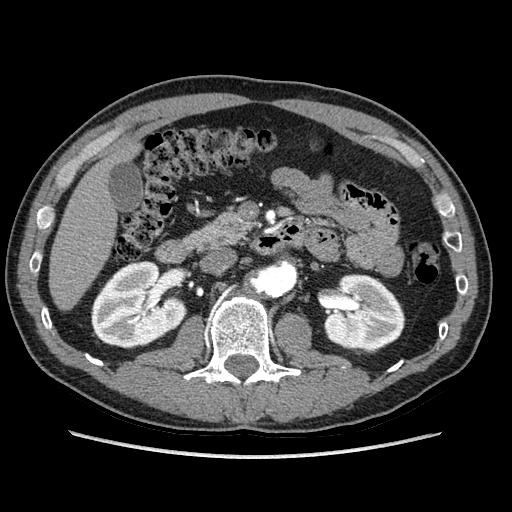
[im 124/180  soft-tissue]
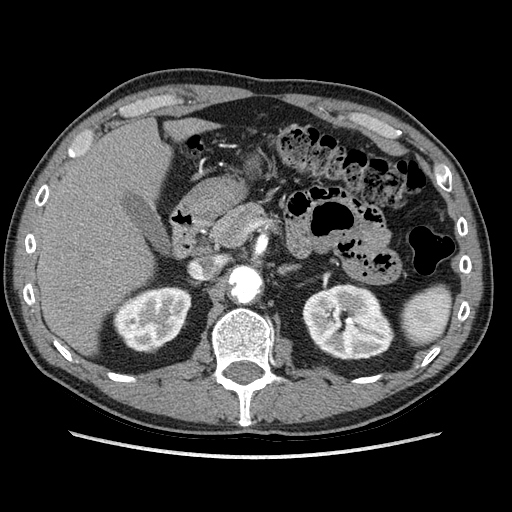
[im 135/180  lung]
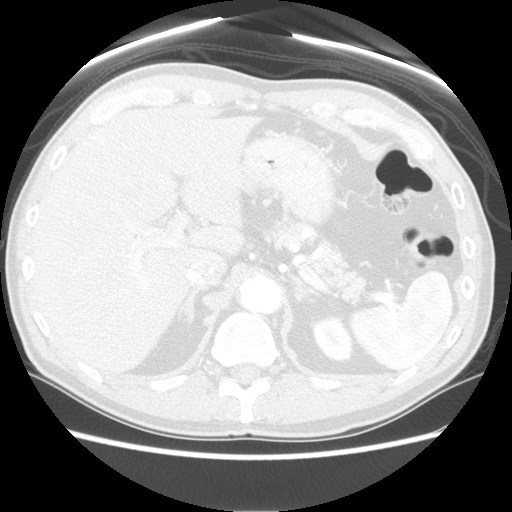
[im 146/180  soft-tissue]
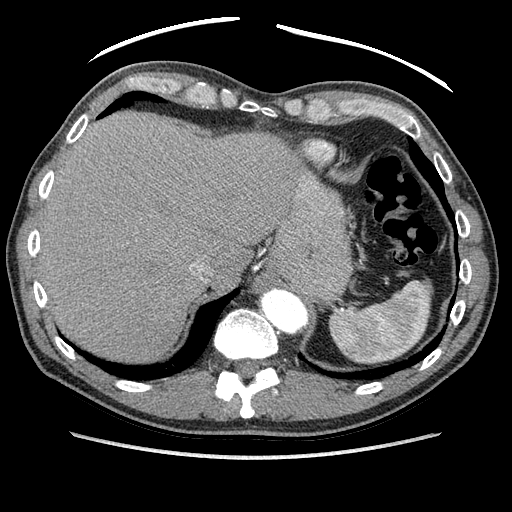
[im 146/180  lung]
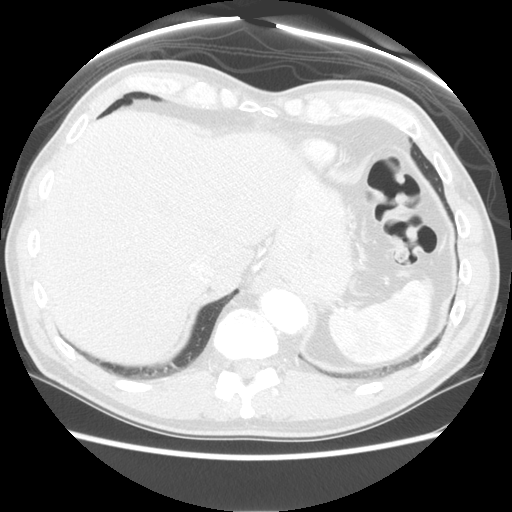
[im 157/180  lung]
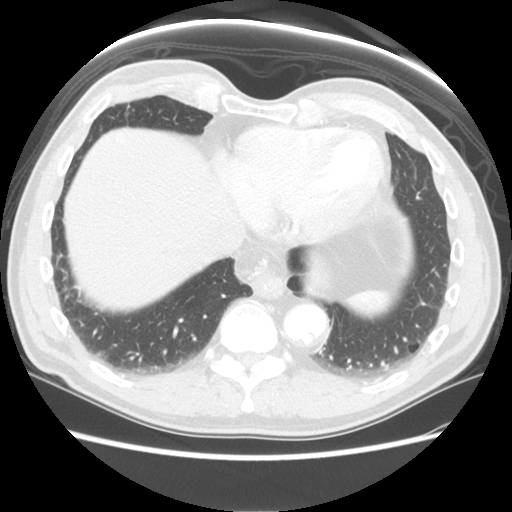
[im 168/180  soft-tissue]
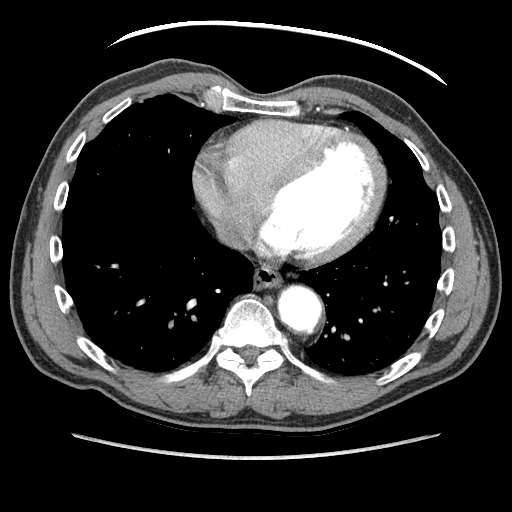
[im 168/180  lung]
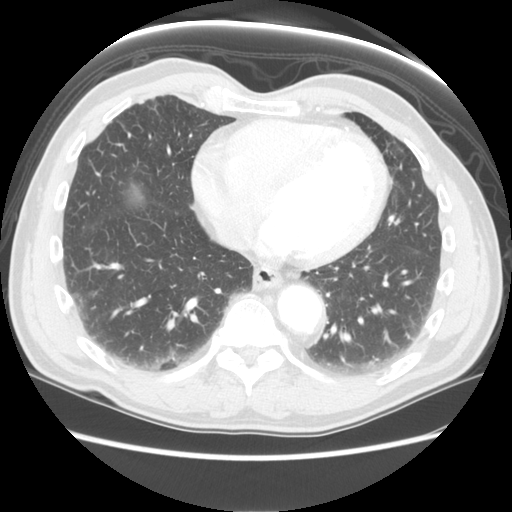
[im 168/180  bone]
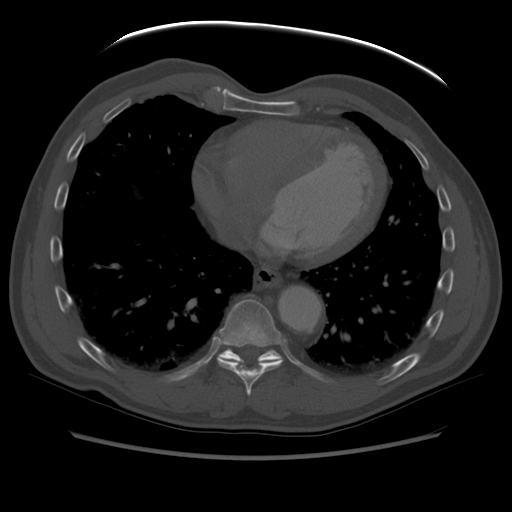

[Series 601: coronal body · coronal · 0.96mm/px · 1 of 111 slices shown, 2 images]
[im 37/111  soft-tissue]
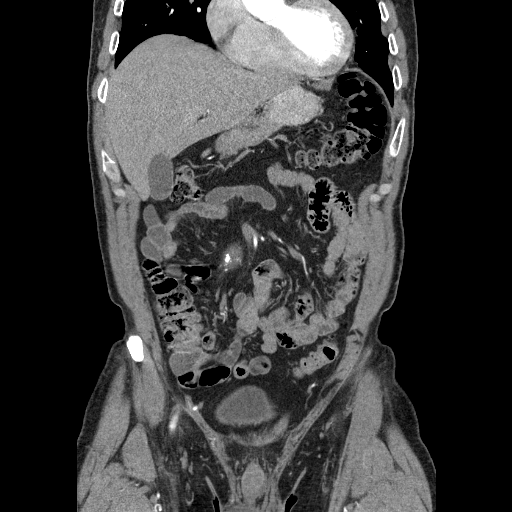
[im 37/111  bone]
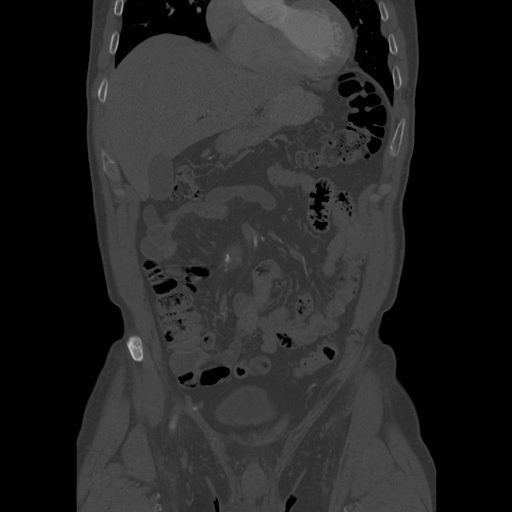

[12 of 36 positions shown; findings below may reference images not displayed]

FINDINGS: Chronic changes are noted at the lung bases  There is a
poorly defined low attenuation in the posterior right lobe of liver
which may represent hemangioma but is difficult to evaluate on this
arterial phase study.  Tiny enhancing focus in the periphery of the
left lobe may represent a small hemangioma as well.  No ductal
dilatation is seen.  No calcified gallstones are noted.  The
pancreas is normal in size and the pancreatic duct is not dilated.
The adrenal glands and spleen are unremarkable.  The gastric wall
is somewhat prominent and appears to enhance, but the stomach is
not distended and this finding is of questionable significance.  If
there are symptoms referable to the the stomach then further
assessment may be warranted.  The kidneys enhance with no
significant abnormality noted.  Significant atheromatous change is
noted throughout the entire abdominal aorta.  The origins of the
celiac axis and SMA are patent and there are single renal arteries
bilaterally.  The immediately infrarenal abdominal aorta is noted
to be very ectatic, with an angulation of at least 50-60 degrees.
The following pre stent graft measurements were obtained.

Length of infrarenal neck (from lowest renal artery): 37 mm
Number of renal arteries:  Right = 1; Left = 1
Diameter of infrarenal neck:  21 mm
Total length of aneurysm:  111 mm
Aneurysm ends at aortic bifurcation: Yes  If no, Distance from
aneurysm to bifurcation:
Greatest aneurysm diameter:  59 mmGreatest common iliac artery
diameters:  Right =16 mm; Left = 18 mm
Diameter of common iliac arteries just above iliac bifurcation:
Right = 13 mm; Left = 14 mm
Length of common iliac arteries:  Right = 21 mm; Left = 33 mm

 Review of the MIP images confirms the above findings.
IMPRESSION: 1.  Infrarenal abdominal aortic aneurysm with maximal diameter of
59 mm, terminating at the bifurcation.

2.  Very ectatic infrarenal abdominal aorta with angulation of at
least 50-60 degrees.  Atheromatous change also involves the
suprarenal and immediately infrarenal abdominal aorta above the
aneurysm.
3.  Somewhat prominent gastric wall which is of questionable
significance in view of the lack of distention.  Correlate
clinically.

CTA PELVIS
FINDINGS: There is fusiform dilatation of the common iliac
arteries, left slightly more prominent than right.  No focal
aneurysm is seen.  Both internal and external iliac arteries are
widely patent.  The urinary bladder is unremarkable.  There does
appear to be a posterior left urinary bladder diverticulum which
contains several small calculi.  The ureters are normal in caliber.
The prostate is normal in size.  No bony abnormality is seen.

 Review of the MIP images confirms the above findings.
IMPRESSION: 1.  Slight fusiform dilatation of the common iliac arteries.  No
focal aneurysm.
2.  Left posterior urinary bladder diverticulum which appears to
contain small calculi.

## 2011-03-25 IMAGING — CR DG CHEST 1V PORT
1 series · 1 of 1 positions shown · non-contrast
Comparison: None

CLINICAL DATA: Abdominal aortic aneurysm repair

PORTABLE CHEST - 1 VIEW

[view not recorded]
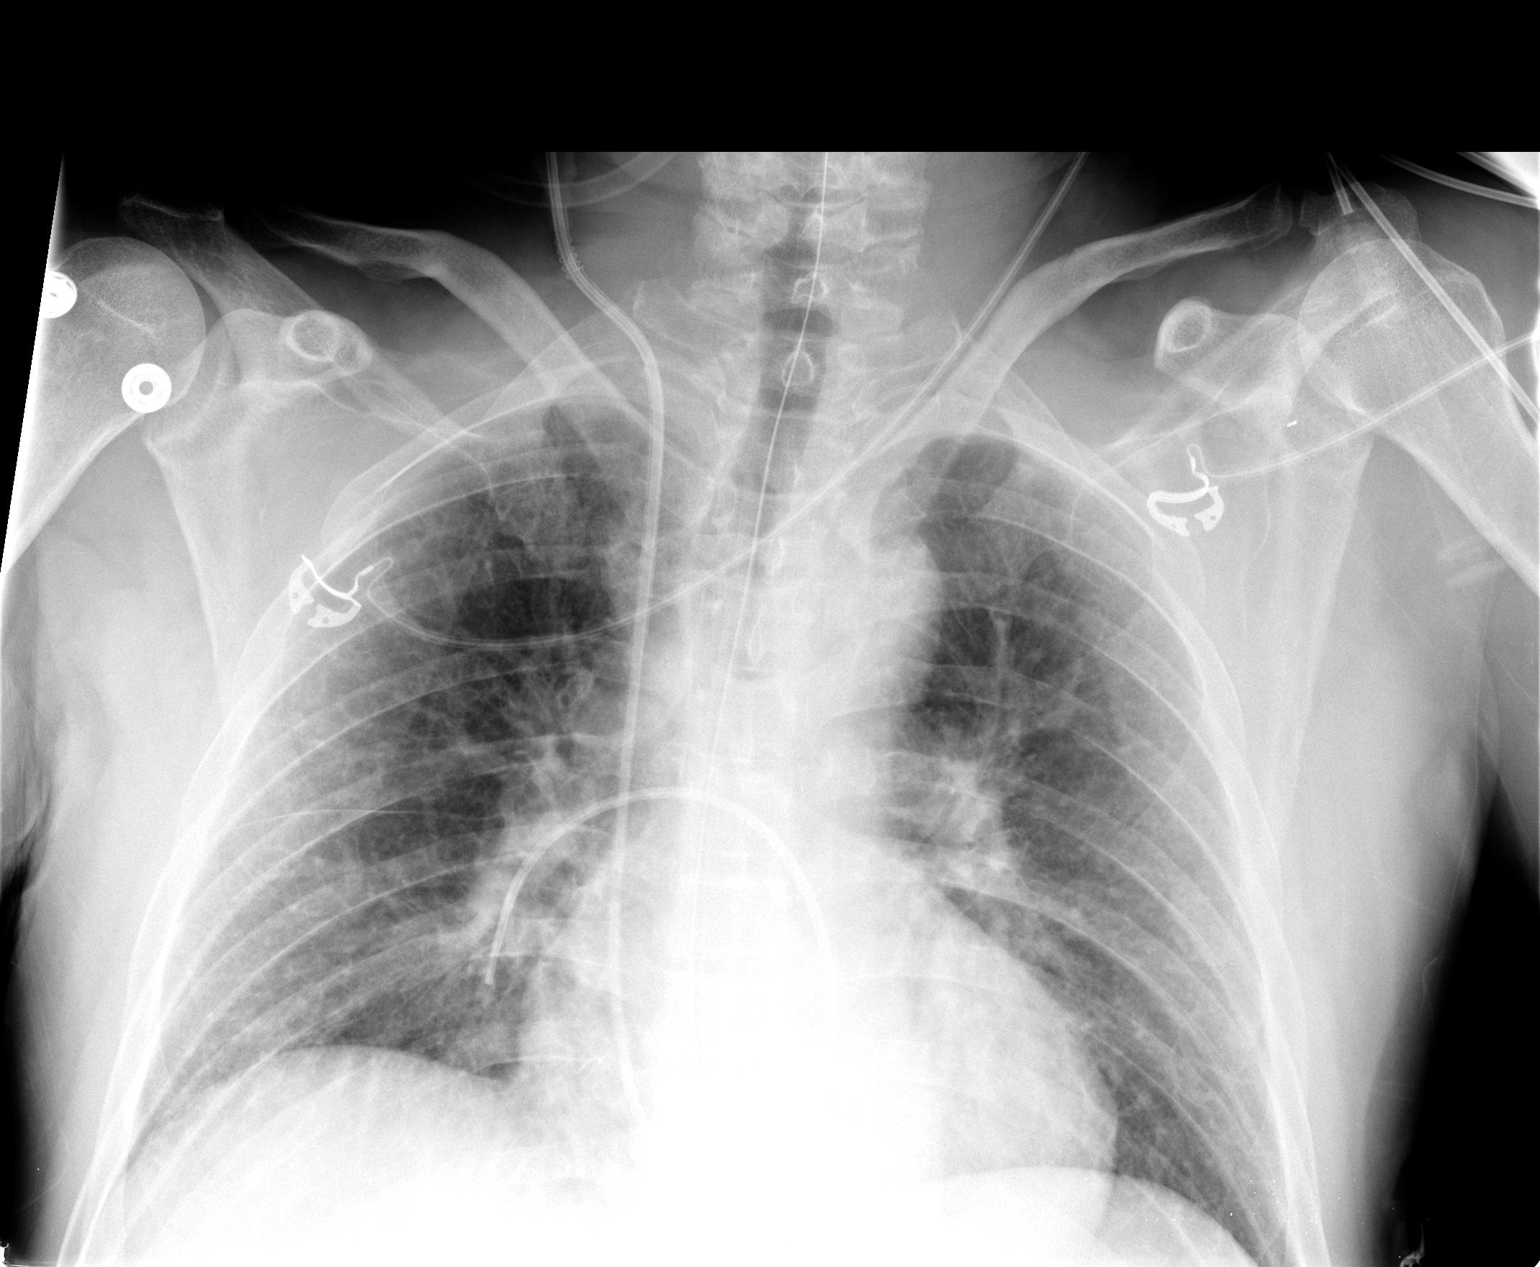

[1 of 1 positions shown; findings below may reference images not displayed]

FINDINGS: Nasogastric tube enters the abdomen.  Swan-Ganz catheter
inserted from a right jugular approach shows its tip in the right
middle or lower lobe pulmonary artery.  Heart size is normal.
There may be mild venous hypertension but there is no frank edema.
No collapse or effusions.
IMPRESSION: Nasogastric tube and Swan-Ganz catheter in place.  Possible venous
hypertension but no frank edema, collapse or effusion.

## 2011-03-26 IMAGING — CR DG CHEST 1V PORT
1 series · 1 of 1 positions shown · non-contrast
Comparison: 12/25/2009

CLINICAL DATA: Status post AAA repair

PORTABLE CHEST - 1 VIEW

[view not recorded]
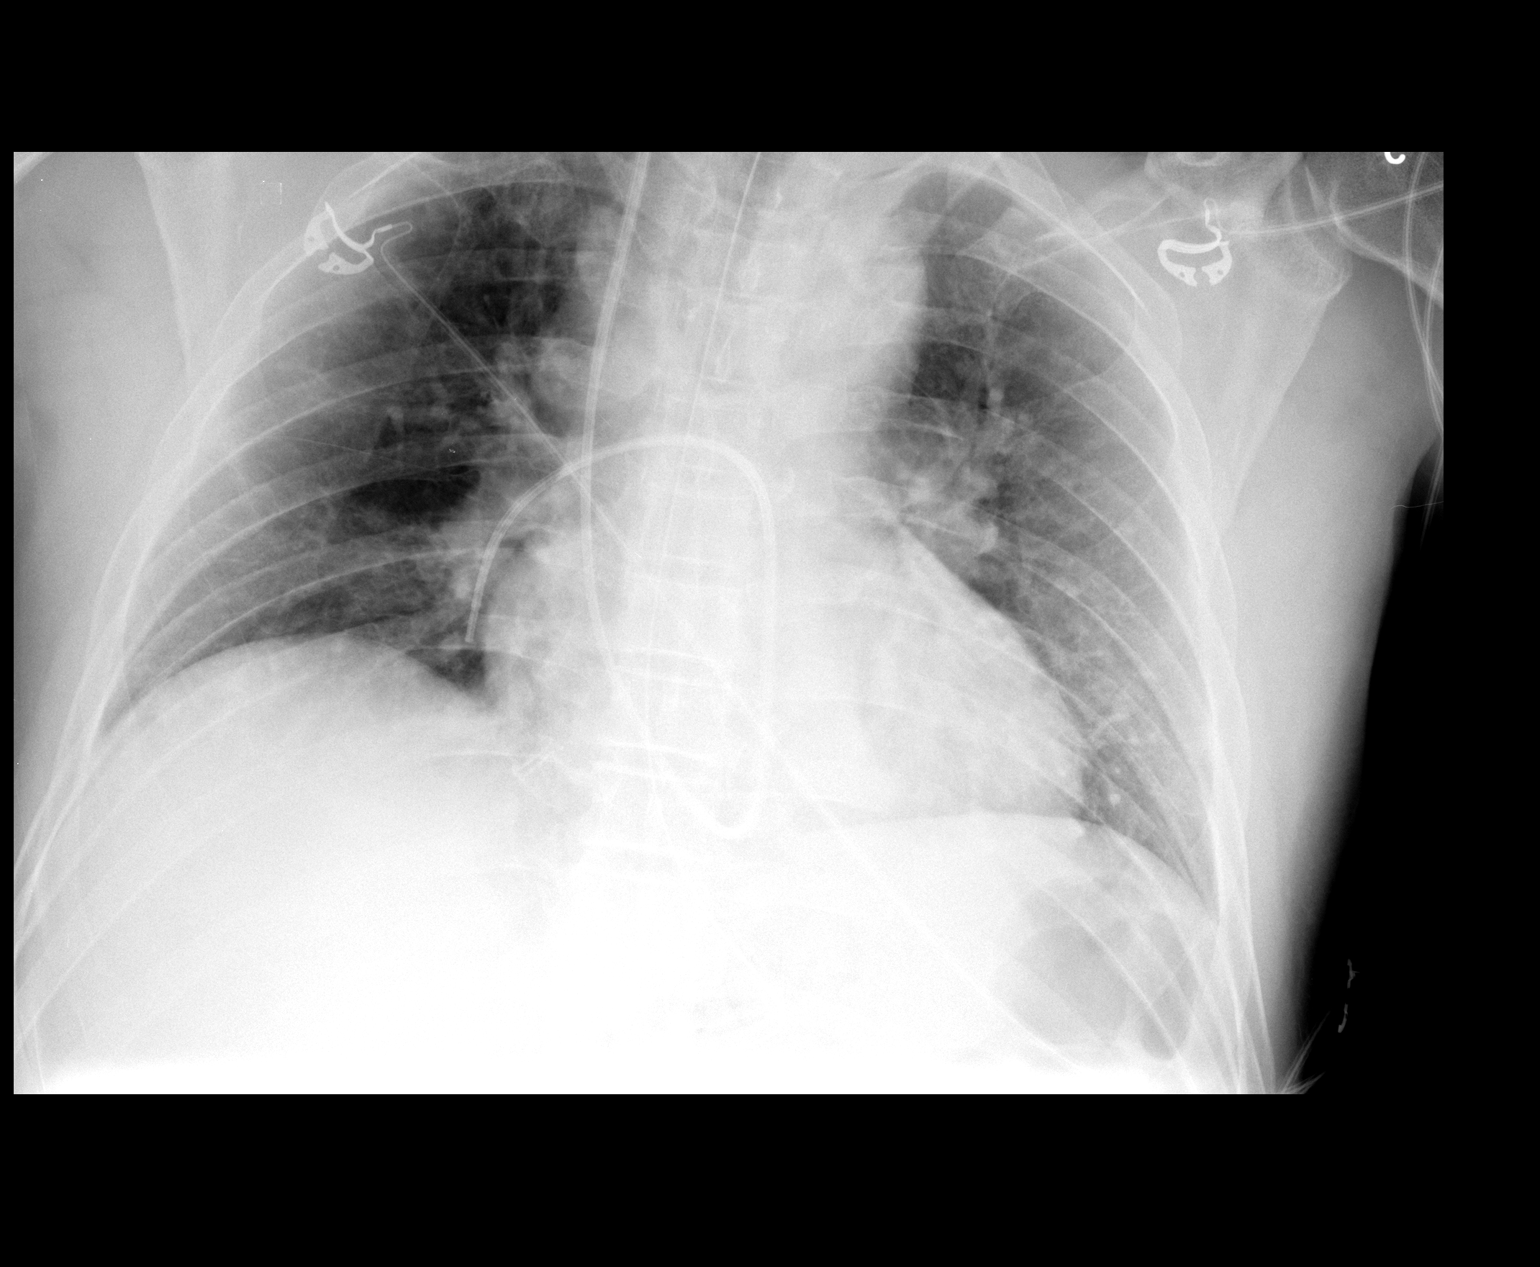

[1 of 1 positions shown; findings below may reference images not displayed]

FINDINGS: The Swan-Ganz catheter tip is in the right lower lobe
pulmonary artery.

There is a nasogastric tube with tip below the GE junction.

The heart size is normal.

No pleural effusions or pulmonary edema.
IMPRESSION: 1.  Stable aeration to the lungs.

2.  No change in position of support apparatus.

## 2012-01-06 ENCOUNTER — Encounter (INDEPENDENT_AMBULATORY_CARE_PROVIDER_SITE_OTHER): Payer: Self-pay

## 2012-01-19 IMAGING — CR DG CHEST 1V PORT
1 series · 1 of 1 positions shown · non-contrast
Comparison: 12/26/2009

CLINICAL DATA: Status post left nephrectomy and rib removal.  Left
renal neoplasm.

PORTABLE CHEST - 1 VIEW

[view not recorded]
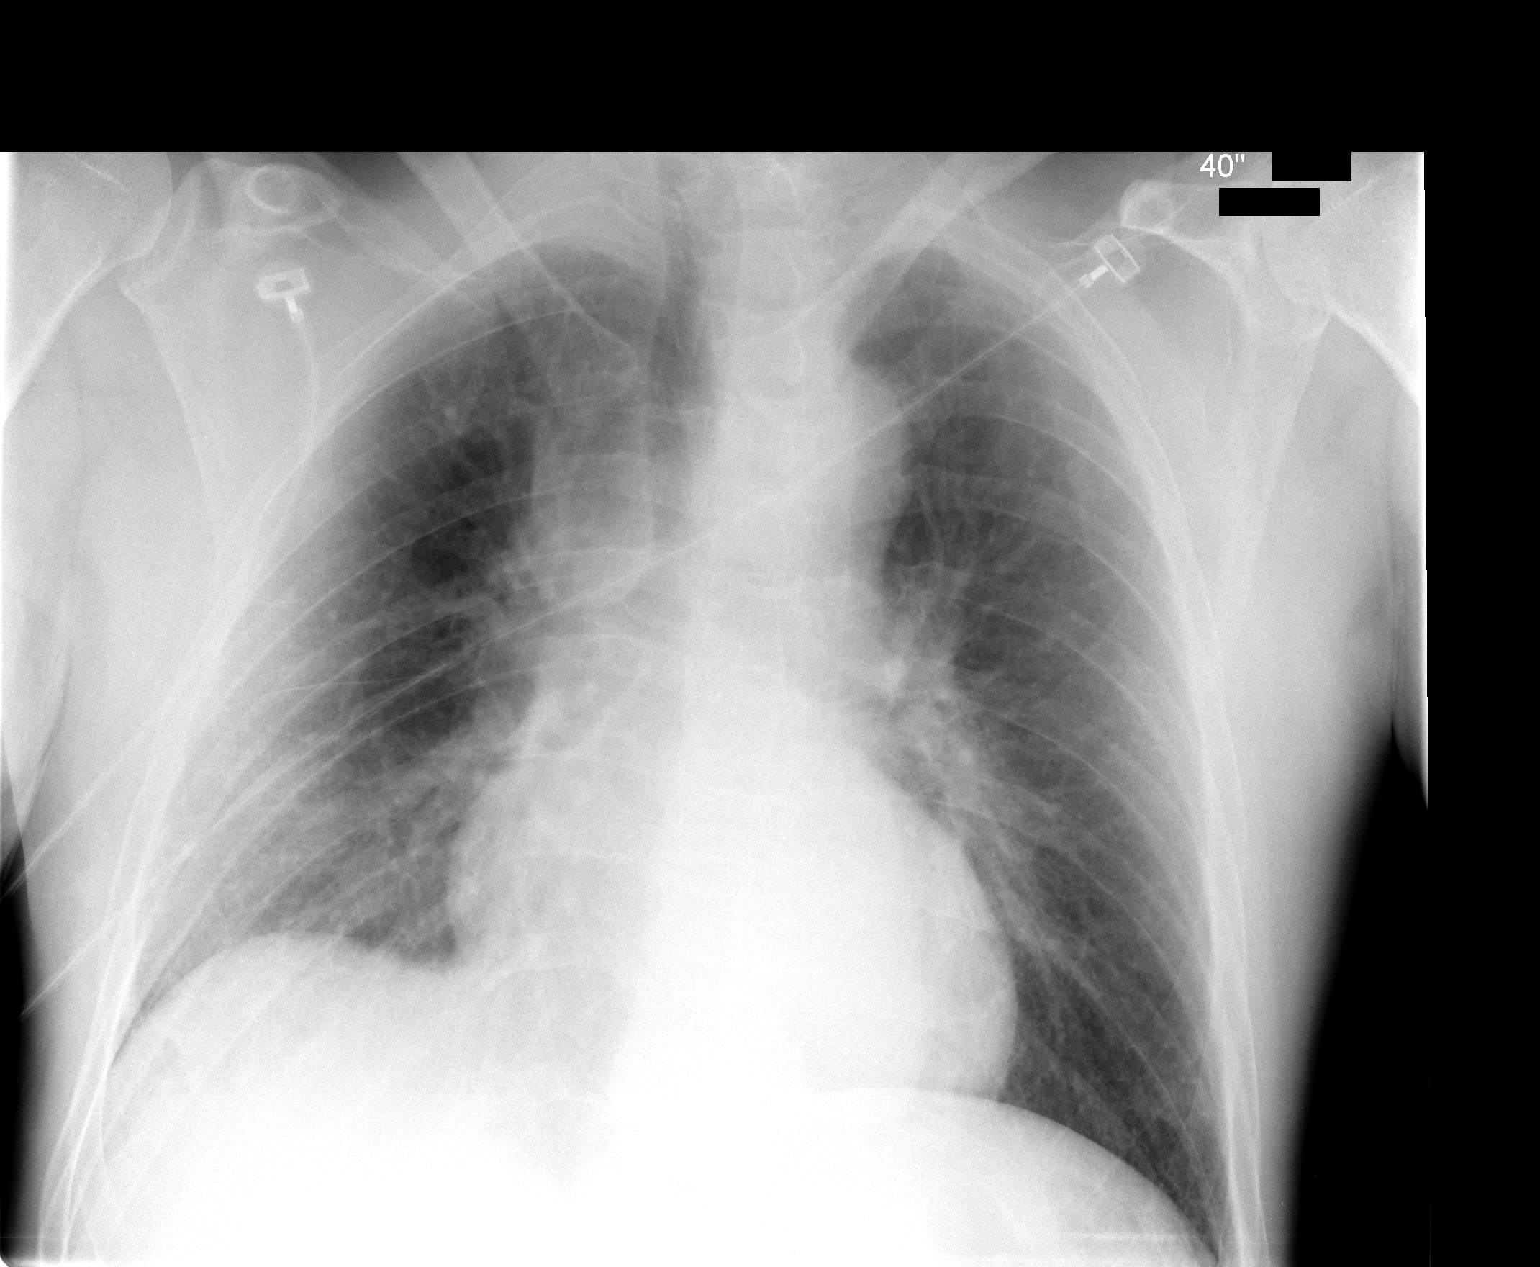

[1 of 1 positions shown; findings below may reference images not displayed]

FINDINGS: There is no pneumothorax.  Heart size and vascularity are
normal.  There is tortuosity of the thoracic aorta.  The lungs are
clear.
IMPRESSION: No acute disease in the chest.  Specifically, no pneumothorax.

## 2012-01-24 ENCOUNTER — Ambulatory Visit (INDEPENDENT_AMBULATORY_CARE_PROVIDER_SITE_OTHER): Payer: PRIVATE HEALTH INSURANCE | Admitting: Surgery

## 2012-01-24 ENCOUNTER — Encounter (INDEPENDENT_AMBULATORY_CARE_PROVIDER_SITE_OTHER): Payer: Self-pay | Admitting: Surgery

## 2012-01-24 VITALS — BP 160/100 | HR 80 | Temp 96.9°F | Resp 18 | Ht 70.0 in | Wt 152.2 lb

## 2012-01-24 DIAGNOSIS — R1904 Left lower quadrant abdominal swelling, mass and lump: Secondary | ICD-10-CM

## 2012-01-24 NOTE — Patient Instructions (Signed)
Return after the CT is done next week.

## 2012-01-24 NOTE — Progress Notes (Signed)
Patient ID: Allen Cortez, male   DOB: 1951/08/23, 61 y.o.   MRN: [id number]  Chief Complaint  Patient presents with  . Hernia    HPI Allen Cortez is a 61 y.o. male.   HPIPatient sent at the request of Dr. Kenton Kingfisher due to a mass in the patient's abdominal wall just left of the previous scar in the lower abdomen. And present for a number of months. Causing mild discomfort recently but was causing more pain earlier in the month. It is tender to touch. It is not red or draining. Currently, he is working and having no problems with the area. He does complain of some left flank pain along his previous nephrectomy incision from one year ago. Denies any nausea or vomiting. His bowel function is normal.  Past Medical History  Diagnosis Date  . GERD (gastroesophageal reflux disease)   . Depression   . Basal cell carcinoma   . DDD (degenerative disc disease)   . Hyperlipidemia   . Sleep apnea   . Colon polyp   . Restless leg syndrome   . Osteopenia   . PAD (peripheral artery disease)   . Diabetes mellitus type II   . Renal cell carcinoma   . Neuromuscular disorder     Past Surgical History  Procedure Date  . Hemorrhoid surgery   . Nephrectomy     left due to renal cell carcenoma  . Abdominal aortic aneurysm repair     Family History  Problem Relation Age of Onset  . Diabetes Mother   . Aneurysm Mother     in brain  . Cancer Father     lung  . Depression Father   . Cancer Sister     lung, breast  . Pancreatitis Brother   . Heart disease Sister     MI  . Depression Sister     Social History History  Substance Use Topics  . Smoking status: Current Everyday Smoker -- 1.0 packs/day    Types: Cigarettes  . Smokeless tobacco: Never Used  . Alcohol Use: Yes     glass of wine at night    Allergies  Allergen Reactions  . Dilaudid (Hydromorphone Hcl) Other (See Comments)    hallucinations  . Mirapex (Pramipexole Dihydrochloride) Other (See Comments)    Leg pain  .  Prednisone Other (See Comments)    irritability    Current Outpatient Prescriptions  Medication Sig Dispense Refill  . AMLODIPINE BESYLATE PO Take 5 mg by mouth daily.      Marland Kitchen aspirin 325 MG EC tablet Take 325 mg by mouth daily.      Marland Kitchen CITALOPRAM HYDROBROMIDE PO Take 40 mg by mouth.      . clonazePAM (KLONOPIN) 2 MG tablet Take 2 mg by mouth 2 (two) times daily as needed.      Marland Kitchen lisinopril (PRINIVIL,ZESTRIL) 20 MG tablet Take 20 mg by mouth daily.      . Multiple Vitamin (MULTI-VITAMIN PO) Take by mouth.      Marland Kitchen omeprazole (PRILOSEC) 20 MG capsule Take 20 mg by mouth daily.      . pregabalin (LYRICA) 75 MG capsule Take 75 mg by mouth 2 (two) times daily.      . simvastatin (ZOCOR) 20 MG tablet Take 20 mg by mouth every evening.        Review of Systems Review of Systems  Constitutional: Negative.   HENT: Negative.   Eyes: Negative.   Respiratory: Negative.   Cardiovascular: Negative.  Gastrointestinal: Negative.   Genitourinary: Negative.   Musculoskeletal: Negative.   Neurological: Negative.   Hematological: Negative.   Psychiatric/Behavioral: Negative.     Blood pressure 160/100, pulse 80, temperature 96.9 F (36.1 C), temperature source Temporal, resp. rate 18, height 5\' 10"  (1.778 m), weight 152 lb 4 oz (69.06 kg).  Physical Exam Physical Exam  Constitutional: He is oriented to person, place, and time. He appears well-developed and well-nourished.  HENT:  Head: Normocephalic and atraumatic.  Eyes: EOM are normal. Pupils are equal, round, and reactive to light.  Neck: Normal range of motion.  Cardiovascular: Normal rate and regular rhythm.   Pulmonary/Chest: Effort normal and breath sounds normal.  Abdominal:    Musculoskeletal: Normal range of motion.  Neurological: He is alert and oriented to person, place, and time.  Skin: Skin is warm and dry.  Psychiatric: He has a normal mood and affect. His behavior is normal. Judgment and thought content normal.    Data  Reviewed Dr Kenton Kingfisher NOTES  Assessment    Left lower quadrant abdominal wall mass    Plan    Await CT later this week.  This could be an small incarcerated hernia vs mass.  Return next week.       Ji Feldner A. 01/24/2012, 11:25 AM

## 2012-01-31 ENCOUNTER — Encounter (INDEPENDENT_AMBULATORY_CARE_PROVIDER_SITE_OTHER): Payer: Self-pay | Admitting: Surgery

## 2012-01-31 ENCOUNTER — Ambulatory Visit (INDEPENDENT_AMBULATORY_CARE_PROVIDER_SITE_OTHER): Payer: PRIVATE HEALTH INSURANCE | Admitting: Surgery

## 2012-01-31 VITALS — BP 130/80 | HR 80 | Temp 98.8°F | Resp 16 | Ht 70.0 in | Wt 156.0 lb

## 2012-01-31 DIAGNOSIS — K469 Unspecified abdominal hernia without obstruction or gangrene: Secondary | ICD-10-CM

## 2012-01-31 NOTE — Progress Notes (Signed)
Patient ID: Allen Cortez, male   DOB: Feb 26, 1951, 61 y.o.   MRN: [id number]  Chief Complaint  Patient presents with  . Follow-up    Abdominal wall mass - LLQ    HPI Allen Cortez is a 61 y.o. male.   HPIPatient sent at the request of Dr. Kenton Kingfisher due to a mass in the patient's abdominal wall just left of the previous scar in the lower abdomen. And present for a number of months. Causing mild discomfort recently but was causing more pain earlier in the month. It is tender to touch. It is not red or draining. Currently, he is working and having no problems with the area. He does complain of some left flank pain along his previous nephrectomy incision from one year ago. Denies any nausea or vomiting. His bowel function is normal.  Past Medical History  Diagnosis Date  . GERD (gastroesophageal reflux disease)   . Depression   . Basal cell carcinoma   . DDD (degenerative disc disease)   . Hyperlipidemia   . Sleep apnea   . Colon polyp   . Restless leg syndrome   . Osteopenia   . PAD (peripheral artery disease)   . Diabetes mellitus type II   . Renal cell carcinoma   . Neuromuscular disorder   . Abdominal wall mass of left lower quadrant     Past Surgical History  Procedure Date  . Hemorrhoid surgery   . Nephrectomy     left due to renal cell carcenoma  . Abdominal aortic aneurysm repair     Family History  Problem Relation Age of Onset  . Diabetes Mother   . Aneurysm Mother     in brain  . Cancer Father     lung  . Depression Father   . Cancer Sister     lung, breast  . Pancreatitis Brother   . Heart disease Sister     MI  . Depression Sister     Social History History  Substance Use Topics  . Smoking status: Current Everyday Smoker -- 1.0 packs/day    Types: Cigarettes  . Smokeless tobacco: Never Used  . Alcohol Use: Yes     glass of wine at night    Allergies  Allergen Reactions  . Dilaudid (Hydromorphone Hcl) Other (See Comments)    hallucinations    . Mirapex (Pramipexole Dihydrochloride) Other (See Comments)    Leg pain  . Prednisone Other (See Comments)    irritability    Current Outpatient Prescriptions  Medication Sig Dispense Refill  . AMLODIPINE BESYLATE PO Take 5 mg by mouth daily.      Marland Kitchen aspirin 325 MG EC tablet Take 325 mg by mouth daily.      Marland Kitchen CITALOPRAM HYDROBROMIDE PO Take 40 mg by mouth.      . clonazePAM (KLONOPIN) 2 MG tablet Take 2 mg by mouth 2 (two) times daily as needed.      Marland Kitchen lisinopril (PRINIVIL,ZESTRIL) 20 MG tablet Take 20 mg by mouth daily.      . Multiple Vitamin (MULTI-VITAMIN PO) Take by mouth.      Marland Kitchen omeprazole (PRILOSEC) 20 MG capsule Take 20 mg by mouth daily.      . pregabalin (LYRICA) 75 MG capsule Take 75 mg by mouth 2 (two) times daily.      . simvastatin (ZOCOR) 20 MG tablet Take 20 mg by mouth every evening.        Review of Systems Review of Systems  Constitutional: Negative.   HENT: Negative.   Eyes: Negative.   Respiratory: Negative.   Cardiovascular: Negative.   Gastrointestinal: Negative.   Genitourinary: Negative.   Musculoskeletal: Negative.   Neurological: Negative.   Hematological: Negative.   Psychiatric/Behavioral: Negative.     Blood pressure 130/80, pulse 80, temperature 98.8 F (37.1 C), temperature source Temporal, resp. rate 16, height 5\' 10"  (1.778 m), weight 156 lb (70.761 kg).  Physical Exam Physical Exam  Constitutional: He is oriented to person, place, and time. He appears well-developed and well-nourished.  HENT:  Head: Normocephalic and atraumatic.  Eyes: EOM are normal. Pupils are equal, round, and reactive to light.  Neck: Normal range of motion.  Cardiovascular: Normal rate and regular rhythm.   Pulmonary/Chest: Effort normal and breath sounds normal.  Abdominal:    Musculoskeletal: Normal range of motion.  Neurological: He is alert and oriented to person, place, and time.  Skin: Skin is warm and dry.  Psychiatric: He has a normal mood and  affect. His behavior is normal. Judgment and thought content normal.    Data Reviewed Dr Kenton Kingfisher NOTES  Assessment    Ventral hernia 2 cm     Plan    CT did not go low enough to evaluate.  I feel that exploration and repair of hernia or excision of mass necessary.  The patient would like to proceed.The risk of hernia repair include bleeding,  Infection,   Recurrence of the hernia,  Mesh use, chronic pain,  Organ injury,  Bowel injury,  Bladder injury,   nerve injury with numbness around the incision,  Death,  and worsening of preexisting  medical problems.  The alternatives to surgery have been discussed as well..  Long term expectations of both operative and non operative treatments have been discussed.   The patient agrees to proceed.   Shamari Trostel A. 01/31/2012, 3:31 PM

## 2012-01-31 NOTE — Patient Instructions (Signed)
Hernia  A hernia occurs when an internal organ pushes out through a weak spot in the abdominal wall. Hernias most commonly occur in the groin and around the navel. Hernias often can be pushed back into place (reduced). Most hernias tend to get worse over time. Some abdominal hernias can get stuck in the opening (irreducible or incarcerated hernia) and cannot be reduced. An irreducible abdominal hernia which is tightly squeezed into the opening is at risk for impaired blood supply (strangulated hernia). A strangulated hernia is a medical emergency. Because of the risk for an irreducible or strangulated hernia, surgery may be recommended to repair a hernia.  CAUSES    Heavy lifting.   Prolonged coughing.   Straining to have a bowel movement.   A cut (incision) made during an abdominal surgery.  HOME CARE INSTRUCTIONS    Bed rest is not required. You may continue your normal activities.   Avoid lifting more than 10 pounds (4.5 kg) or straining.   Cough gently. If you are a smoker it is best to stop. Even the best hernia repair can break down with the continual strain of coughing. Even if you do not have your hernia repaired, a cough will continue to aggravate the problem.   Do not wear anything tight over your hernia. Do not try to keep it in with an outside bandage or truss. These can damage abdominal contents if they are trapped within the hernia sac.   Eat a normal diet.   Avoid constipation. Straining over long periods of time will increase hernia size and encourage breakdown of repairs. If you cannot do this with diet alone, stool softeners may be used.  SEEK IMMEDIATE MEDICAL CARE IF:    You have a fever.   You develop increasing abdominal pain.   You feel nauseous or vomit.   Your hernia is stuck outside the abdomen, looks discolored, feels hard, or is tender.   You have any changes in your bowel habits or in the hernia that are unusual for you.   You have increased pain or swelling around the  hernia.   You cannot push the hernia back in place by applying gentle pressure while lying down.  MAKE SURE YOU:    Understand these instructions.   Will watch your condition.   Will get help right away if you are not doing well or get worse.  Document Released: 09/19/2005 Document Revised: 09/08/2011 Document Reviewed: 05/08/2008  [employer] Patient Information 2012 [employer]

## 2012-02-29 ENCOUNTER — Encounter (HOSPITAL_BASED_OUTPATIENT_CLINIC_OR_DEPARTMENT_OTHER): Payer: Self-pay | Admitting: *Deleted

## 2012-02-29 NOTE — Progress Notes (Signed)
Reviewed with dr crews-ok for surgery regarding sleep apnea-had study 03-cannot find-pt thinks mild-mod-denies snoring now-last 2 surgeries did well post op-

## 2012-02-29 NOTE — Progress Notes (Signed)
To come in for labs,[id number]-ekg Says he used cpap yrs ago-not now-will try to get study-had AAA-2011-and partial nephrectomy 1/12-no problems

## 2012-03-02 ENCOUNTER — Other Ambulatory Visit: Payer: Self-pay

## 2012-03-02 ENCOUNTER — Ambulatory Visit
Admission: RE | Admit: 2012-03-02 | Discharge: 2012-03-02 | Disposition: A | Discharge status: Home / Self Care | Payer: PRIVATE HEALTH INSURANCE | Source: Ambulatory Visit | Attending: Surgery | Admitting: Surgery

## 2012-03-02 ENCOUNTER — Encounter (HOSPITAL_BASED_OUTPATIENT_CLINIC_OR_DEPARTMENT_OTHER)
Admission: RE | Admit: 2012-03-02 | Discharge: 2012-03-02 | Disposition: A | Discharge status: Home / Self Care | Payer: PRIVATE HEALTH INSURANCE | Source: Ambulatory Visit | Attending: Surgery | Admitting: Surgery

## 2012-03-02 ENCOUNTER — Encounter (HOSPITAL_BASED_OUTPATIENT_CLINIC_OR_DEPARTMENT_OTHER): Payer: Self-pay | Admitting: *Deleted

## 2012-03-02 LAB — DIFFERENTIAL
Eosinophils Absolute: 0.2 10*3/uL (ref 0.0–0.7)
Eosinophils Relative: 2 % (ref 0–5)
Lymphocytes Relative: 25 % (ref 12–46)
Lymphs Abs: 1.8 10*3/uL (ref 0.7–4.0)
Monocytes Absolute: 0.5 10*3/uL (ref 0.1–1.0)
Monocytes Relative: 8 % (ref 3–12)

## 2012-03-02 LAB — COMPREHENSIVE METABOLIC PANEL
ALT: 8 U/L (ref 0–53)
AST: 12 U/L (ref 0–37)
Alkaline Phosphatase: 63 U/L (ref 39–117)
CO2: 28 mEq/L (ref 19–32)
Calcium: 9.7 mg/dL (ref 8.4–10.5)
Glucose, Bld: 115 mg/dL — ABNORMAL HIGH (ref 70–99)
Potassium: 4.5 mEq/L (ref 3.5–5.1)
Sodium: 137 mEq/L (ref 135–145)
Total Protein: 7.2 g/dL (ref 6.0–8.3)

## 2012-03-02 LAB — CBC
Hemoglobin: 14.6 g/dL (ref 13.0–17.0)
MCH: 31.2 pg (ref 26.0–34.0)
MCHC: 34 g/dL (ref 30.0–36.0)
Platelets: 195 10*3/uL (ref 150–400)
RBC: 4.68 MIL/uL (ref 4.22–5.81)

## 2012-03-02 NOTE — Progress Notes (Signed)
Dr Al Corpus shown CXR results-OK for surgery.

## 2012-03-06 ENCOUNTER — Encounter (HOSPITAL_BASED_OUTPATIENT_CLINIC_OR_DEPARTMENT_OTHER): Payer: Self-pay | Admitting: Anesthesiology

## 2012-03-06 ENCOUNTER — Encounter (HOSPITAL_BASED_OUTPATIENT_CLINIC_OR_DEPARTMENT_OTHER)
Admission: RE | Disposition: A | Discharge status: Home / Self Care | Payer: Self-pay | Source: Ambulatory Visit | Attending: Surgery

## 2012-03-06 ENCOUNTER — Ambulatory Visit (HOSPITAL_BASED_OUTPATIENT_CLINIC_OR_DEPARTMENT_OTHER): Payer: PRIVATE HEALTH INSURANCE | Admitting: Anesthesiology

## 2012-03-06 ENCOUNTER — Encounter (HOSPITAL_BASED_OUTPATIENT_CLINIC_OR_DEPARTMENT_OTHER): Payer: Self-pay | Admitting: *Deleted

## 2012-03-06 ENCOUNTER — Ambulatory Visit (HOSPITAL_BASED_OUTPATIENT_CLINIC_OR_DEPARTMENT_OTHER)
Admission: RE | Admit: 2012-03-06 | Discharge: 2012-03-06 | Disposition: A | Discharge status: Home / Self Care | Payer: PRIVATE HEALTH INSURANCE | Source: Ambulatory Visit | Attending: Surgery | Admitting: Surgery

## 2012-03-06 DIAGNOSIS — T81509A Unspecified complication of foreign body accidentally left in body following unspecified procedure, initial encounter: Secondary | ICD-10-CM | POA: Insufficient documentation

## 2012-03-06 DIAGNOSIS — Y838 Other surgical procedures as the cause of abnormal reaction of the patient, or of later complication, without mention of misadventure at the time of the procedure: Secondary | ICD-10-CM | POA: Insufficient documentation

## 2012-03-06 DIAGNOSIS — R7309 Other abnormal glucose: Secondary | ICD-10-CM | POA: Insufficient documentation

## 2012-03-06 DIAGNOSIS — R1904 Left lower quadrant abdominal swelling, mass and lump: Secondary | ICD-10-CM

## 2012-03-06 DIAGNOSIS — F17200 Nicotine dependence, unspecified, uncomplicated: Secondary | ICD-10-CM | POA: Insufficient documentation

## 2012-03-06 DIAGNOSIS — K219 Gastro-esophageal reflux disease without esophagitis: Secondary | ICD-10-CM | POA: Insufficient documentation

## 2012-03-06 DIAGNOSIS — Z01812 Encounter for preprocedural laboratory examination: Secondary | ICD-10-CM | POA: Insufficient documentation

## 2012-03-06 DIAGNOSIS — I739 Peripheral vascular disease, unspecified: Secondary | ICD-10-CM | POA: Insufficient documentation

## 2012-03-06 DIAGNOSIS — C001 Malignant neoplasm of external lower lip: Secondary | ICD-10-CM | POA: Insufficient documentation

## 2012-03-06 DIAGNOSIS — I10 Essential (primary) hypertension: Secondary | ICD-10-CM | POA: Insufficient documentation

## 2012-03-06 DIAGNOSIS — IMO0002 Reserved for concepts with insufficient information to code with codable children: Secondary | ICD-10-CM

## 2012-03-06 HISTORY — PX: VENTRAL HERNIA REPAIR: SHX424

## 2012-03-06 HISTORY — PX: HERNIA REPAIR: SHX51

## 2012-03-06 SURGERY — REPAIR, HERNIA, VENTRAL
Anesthesia: General | Site: Abdomen | Wound class: Clean

## 2012-03-06 MED ORDER — CEFAZOLIN SODIUM-DEXTROSE 2-3 GM-% IV SOLR
2.0000 g | INTRAVENOUS | Status: AC
  Administered 2012-03-06: 2 g via INTRAVENOUS

## 2012-03-06 MED ORDER — LACTATED RINGERS IV SOLN
INTRAVENOUS | Status: DC
  Administered 2012-03-06 (×2): via INTRAVENOUS

## 2012-03-06 MED ORDER — HYDROCODONE-ACETAMINOPHEN 5-325 MG PO TABS: 1.0000 | ORAL_TABLET | Freq: Four times a day (QID) | ORAL | Status: AC | PRN

## 2012-03-06 MED ORDER — MIDAZOLAM HCL 5 MG/5ML IJ SOLN
INTRAMUSCULAR | Status: DC | PRN
  Administered 2012-03-06: 2 mg via INTRAVENOUS

## 2012-03-06 MED ORDER — LIDOCAINE HCL (CARDIAC) 20 MG/ML IV SOLN
INTRAVENOUS | Status: DC | PRN
  Administered 2012-03-06: 50 mg via INTRAVENOUS

## 2012-03-06 MED ORDER — FENTANYL CITRATE 0.05 MG/ML IJ SOLN
INTRAMUSCULAR | Status: DC | PRN
  Administered 2012-03-06 (×2): 50 ug via INTRAVENOUS

## 2012-03-06 MED ORDER — PROPOFOL 10 MG/ML IV EMUL
INTRAVENOUS | Status: DC | PRN
  Administered 2012-03-06: 150 mg via INTRAVENOUS

## 2012-03-06 MED ORDER — EPHEDRINE SULFATE 50 MG/ML IJ SOLN
INTRAMUSCULAR | Status: DC | PRN
  Administered 2012-03-06 (×2): 10 mg via INTRAVENOUS

## 2012-03-06 MED ORDER — CHLORHEXIDINE GLUCONATE 4 % EX LIQD: 1.0000 "application " | Freq: Once | CUTANEOUS | Status: DC

## 2012-03-06 MED ORDER — ONDANSETRON HCL 4 MG/2ML IJ SOLN
INTRAMUSCULAR | Status: DC | PRN
  Administered 2012-03-06: 4 mg via INTRAVENOUS

## 2012-03-06 MED ORDER — BUPIVACAINE-EPINEPHRINE PF 0.25-1:200000 % IJ SOLN
INTRAMUSCULAR | Status: DC | PRN
  Administered 2012-03-06: 10 mL

## 2012-03-06 MED ORDER — OXYCODONE HCL 5 MG PO TABS
5.0000 mg | ORAL_TABLET | Freq: Once | ORAL | Status: AC
  Administered 2012-03-06: 5 mg via ORAL

## 2012-03-06 SURGICAL SUPPLY — 48 items
BENZOIN TINCTURE PRP APPL 2/3 (GAUZE/BANDAGES/DRESSINGS) IMPLANT
BLADE SURG 10 STRL SS (BLADE) ×3 IMPLANT
BLADE SURG 15 STRL LF DISP TIS (BLADE) ×2 IMPLANT
BLADE SURG 15 STRL SS (BLADE) ×1
BLADE SURG ROTATE 9660 (MISCELLANEOUS) IMPLANT
CANISTER SUCTION 1200CC (MISCELLANEOUS) ×3 IMPLANT
CHLORAPREP W/TINT 26ML (MISCELLANEOUS) ×3 IMPLANT
CLEANER CAUTERY TIP 5X5 PAD (MISCELLANEOUS) ×2 IMPLANT
CLOTH BEACON ORANGE TIMEOUT ST (SAFETY) ×3 IMPLANT
COVER MAYO STAND STRL (DRAPES) ×3 IMPLANT
COVER TABLE BACK 60X90 (DRAPES) ×3 IMPLANT
DECANTER SPIKE VIAL GLASS SM (MISCELLANEOUS) IMPLANT
DERMABOND ADVANCED (GAUZE/BANDAGES/DRESSINGS) ×1
DERMABOND ADVANCED .7 DNX12 (GAUZE/BANDAGES/DRESSINGS) ×2 IMPLANT
DRAPE LAPAROTOMY TRNSV 102X78 (DRAPE) ×3 IMPLANT
DRAPE UTILITY XL STRL (DRAPES) ×3 IMPLANT
DRSG TEGADERM 4X4.75 (GAUZE/BANDAGES/DRESSINGS) IMPLANT
ELECT REM PT RETURN 9FT ADLT (ELECTROSURGICAL) ×3
ELECTRODE REM PT RTRN 9FT ADLT (ELECTROSURGICAL) ×2 IMPLANT
GLOVE BIO SURGEON STRL SZ 6.5 (GLOVE) ×3 IMPLANT
GLOVE BIOGEL PI IND STRL 6.5 (GLOVE) ×2 IMPLANT
GLOVE BIOGEL PI IND STRL 8 (GLOVE) ×2 IMPLANT
GLOVE BIOGEL PI INDICATOR 6.5 (GLOVE) ×1
GLOVE BIOGEL PI INDICATOR 8 (GLOVE) ×1
GLOVE ECLIPSE 8.0 STRL XLNG CF (GLOVE) ×3 IMPLANT
GOWN PREVENTION PLUS XLARGE (GOWN DISPOSABLE) ×6 IMPLANT
NEEDLE HYPO 25X1 1.5 SAFETY (NEEDLE) ×3 IMPLANT
NS IRRIG 1000ML POUR BTL (IV SOLUTION) ×3 IMPLANT
PACK BASIN DAY SURGERY FS (CUSTOM PROCEDURE TRAY) ×3 IMPLANT
PAD CLEANER CAUTERY TIP 5X5 (MISCELLANEOUS) ×1
PENCIL BUTTON HOLSTER BLD 10FT (ELECTRODE) ×3 IMPLANT
SLEEVE SCD COMPRESS KNEE MED (MISCELLANEOUS) ×3 IMPLANT
STAPLER VISISTAT 35W (STAPLE) IMPLANT
STRIP CLOSURE SKIN 1/2X4 (GAUZE/BANDAGES/DRESSINGS) IMPLANT
SUT MON AB 4-0 PC3 18 (SUTURE) ×3 IMPLANT
SUT NOVA NAB DX-16 0-1 5-0 T12 (SUTURE) IMPLANT
SUT NOVA NAB GS-22 2 0 T19 (SUTURE) IMPLANT
SUT PROLENE 0 CT 1 30 (SUTURE) IMPLANT
SUT SILK 3 0 SH 30 (SUTURE) IMPLANT
SUT VIC AB 2-0 SH 27 (SUTURE) ×1
SUT VIC AB 2-0 SH 27XBRD (SUTURE) ×2 IMPLANT
SUT VIC AB 3-0 SH 27 (SUTURE)
SUT VIC AB 3-0 SH 27X BRD (SUTURE) IMPLANT
SYR CONTROL 10ML LL (SYRINGE) ×3 IMPLANT
TOWEL OR 17X24 6PK STRL BLUE (TOWEL DISPOSABLE) ×3 IMPLANT
TOWEL OR NON WOVEN STRL DISP B (DISPOSABLE) ×3 IMPLANT
TUBE CONNECTING 20X1/4 (TUBING) ×3 IMPLANT
YANKAUER SUCT BULB TIP NO VENT (SUCTIONS) ×3 IMPLANT

## 2012-03-06 NOTE — Discharge Instructions (Signed)

## 2012-03-06 NOTE — H&P (Signed)
Allen Cortez    MRN: [id number]   Description: 61 year old male  Provider: Turner Daniels., MD  Department: [facility]        Diagnoses     Abdominal wall mass of left lower quadrant   - Primary    789.34      Reason for Visit     Hernia        Vitals - Last Recorded       BP Pulse Temp(Src) Resp Ht Wt    160/100  80  96.9 F (36.1 C) (Temporal)  18  5\' 10"  (1.778 m)  152 lb 4 oz (69.06 kg)          BMI              21.85 kg/m2                 Progress Notes     Saleemah Mollenhauer A., MD  Patient ID: Allen Cortez, male   DOB: 08/20/1951, 61 y.o.   MRN: [id number]    Chief Complaint   Patient presents with   .  Hernia      HPI Allen Cortez is a 61 y.o. male.   HPIPatient sent at the request of Dr. Kenton Kingfisher due to a mass in the patient's abdominal wall just left of the previous scar in the lower abdomen. And present for a number of months. Causing mild discomfort recently but was causing more pain earlier in the month. It is tender to touch. It is not red or draining. Currently, he is working and having no problems with the area. He does complain of some left flank pain along his previous nephrectomy incision from one year ago. Denies any nausea or vomiting. His bowel function is normal.    Past Medical History   Diagnosis  Date   .  GERD (gastroesophageal reflux disease)     .  Depression     .  Basal cell carcinoma     .  DDD (degenerative disc disease)     .  Hyperlipidemia     .  Sleep apnea     .  Colon polyp     .  Restless leg syndrome     .  Osteopenia     .  PAD (peripheral artery disease)     .  Diabetes mellitus type II     .  Renal cell carcinoma     .  Neuromuscular disorder         Past Surgical History   Procedure  Date   .  Hemorrhoid surgery     .  Nephrectomy         left due to renal cell carcenoma   .  Abdominal aortic aneurysm repair         Family History   Problem  Relation  Age of Onset   .  Diabetes   Mother     .  Aneurysm  Mother         in brain   .  Cancer  Father         lung   .  Depression  Father     .  Cancer  Sister         lung, breast   .  Pancreatitis  Brother     .  Heart disease  Sister         MI   .  Depression  Sister        Social History History   Substance Use Topics   .  Smoking status:  Current Everyday Smoker -- 1.0 packs/day       Types:  Cigarettes   .  Smokeless tobacco:  Never Used   .  Alcohol Use:  Yes         glass of wine at night       Allergies   Allergen  Reactions   .  Dilaudid (Hydromorphone Hcl)  Other (See Comments)       hallucinations   .  Mirapex (Pramipexole Dihydrochloride)  Other (See Comments)       Leg pain   .  Prednisone  Other (See Comments)       irritability       Current Outpatient Prescriptions   Medication  Sig  Dispense  Refill   .  AMLODIPINE BESYLATE PO  Take 5 mg by mouth daily.         Marland Kitchen  aspirin 325 MG EC tablet  Take 325 mg by mouth daily.         Marland Kitchen  CITALOPRAM HYDROBROMIDE PO  Take 40 mg by mouth.         .  clonazePAM (KLONOPIN) 2 MG tablet  Take 2 mg by mouth 2 (two) times daily as needed.         Marland Kitchen  lisinopril (PRINIVIL,ZESTRIL) 20 MG tablet  Take 20 mg by mouth daily.         .  Multiple Vitamin (MULTI-VITAMIN PO)  Take by mouth.         Marland Kitchen  omeprazole (PRILOSEC) 20 MG capsule  Take 20 mg by mouth daily.         .  pregabalin (LYRICA) 75 MG capsule  Take 75 mg by mouth 2 (two) times daily.         .  simvastatin (ZOCOR) 20 MG tablet  Take 20 mg by mouth every evening.            Review of Systems Review of Systems  Constitutional: Negative.   HENT: Negative.   Eyes: Negative.   Respiratory: Negative.   Cardiovascular: Negative.   Gastrointestinal: Negative.   Genitourinary: Negative.   Musculoskeletal: Negative.   Neurological: Negative.   Hematological: Negative.   Psychiatric/Behavioral: Negative.     Blood pressure 160/100, pulse 80, temperature 96.9 F (36.1 C), temperature source  Temporal, resp. rate 18, height 5\' 10"  (1.778 m), weight 152 lb 4 oz (69.06 kg).   Physical Exam Physical Exam  Constitutional: He is oriented to person, place, and time. He appears well-developed and well-nourished.  HENT:   Head: Normocephalic and atraumatic.  Eyes: EOM are normal. Pupils are equal, round, and reactive to light.  Neck: Normal range of motion.  Cardiovascular: Normal rate and regular rhythm.   Pulmonary/Chest: Effort normal and breath sounds normal.  Abdominal:    Musculoskeletal: Normal range of motion.  Neurological: He is alert and oriented to person, place, and time.  Skin: Skin is warm and dry.  Psychiatric: He has a normal mood and affect. His behavior is normal. Judgment and thought content normal.    Data Reviewed Dr Kenton Kingfisher NOTES   Assessment Left lower quadrant abdominal wall mass vs hernia  Plan Pt would like to proceed with surgery to explore area to the left of the incision to exclude small hernia since scans not helpful.  Risks discussed with the patient.The risk of  hernia repair include bleeding,  Infection,   Recurrence of the hernia,  Mesh use, chronic pain,  Organ injury,  Bowel injury,  Bladder injury,   nerve injury with numbness around the incision,  Death,  and worsening of preexisting  medical problems.  The alternatives to surgery have been discussed as well..  Long term expectations of both operative and non operative treatments have been discussed.   The patient agrees to proceed.       Dawood Spitler A. 03/06/2012

## 2012-03-06 NOTE — Op Note (Signed)
Preoperative diagnosis: Left lower quadrant abdominal mass possible hernia  Postoperative diagnosis: Stitch granuloma left lower quadrant  Procedure: Wound exploration with excision of stitch granuloma left lower quadrant abdominal wall  Surgeon: Erroll Luna M.D.  Anesthesia: LMA with 0.25 % bupivicaine with epi  EBL: Minimal  Indications for procedure: The patient presents to to a bulge or mass in his left lower quadrant adjacent to a previous laparotomy incision. On exam his may have Been a hernia versus a lipoma. It was difficult to tell on examination and x-ray evaluation was not helpful. He wished at the area DL with and I recommended expiration the operating room with repair if this turned out to be a hernia or excision if this was a mass. Risks, benefits and alternative therapies were discussed with the patient he agreed to proceed.  Description of procedure: The patient was met in the holding area and the area was marked on his abdominal wall. He is taken back to the operating room and placed supine on the operating room table. After induction of general anesthesia, the abdomen was prepped and draped in a sterile fashion. Timeout was done he received appropriate preoperative antibiotics. I opened the lower portion of laparotomy scar. I dissected toward the patient's left lower quadrant encountered a stitch from his previous laparotomy and knot. There was no evidence of hernia in this area but the skin was redundant over the stitch. This corresponded to what was felt on physical examination. I excised the old stitch . The wound was irrigated and was hemostatic. There was no evidence of hernia. The fascia was thin and intact. The balloon was closed with 3-0 Vicryl and 4-0 Monocryl. All final counts are found to be correct. Dermabond was applied. The patient was awoke taken to recovery in satisfactory condition.

## 2012-03-06 NOTE — Anesthesia Postprocedure Evaluation (Signed)
  Anesthesia Post-op Note  Patient: Allen Cortez  Procedure(s) Performed: Procedure(s) (LRB): HERNIA REPAIR VENTRAL ADULT (N/A) INSERTION OF MESH (N/A)  Patient Location: PACU  Anesthesia Type: General  Level of Consciousness: awake, alert  and oriented  Airway and Oxygen Therapy: Patient Spontanous Breathing  Post-op Pain: none  Post-op Assessment: Post-op Vital signs reviewed, Patient's Cardiovascular Status Stable, Respiratory Function Stable, Patent Airway, No signs of Nausea or vomiting and Pain level controlled  Post-op Vital Signs: Reviewed and stable  Complications: No apparent anesthesia complications

## 2012-03-06 NOTE — Transfer of Care (Signed)
Immediate Anesthesia Transfer of Care Note  Patient: Allen Cortez  Procedure(s) Performed: Procedure(s) (LRB): HERNIA REPAIR VENTRAL ADULT (N/A) INSERTION OF MESH (N/A)  Patient Location: PACU  Anesthesia Type: General  Level of Consciousness: sedated  Airway & Oxygen Therapy: Patient Spontanous Breathing and Patient connected to face mask oxygen  Post-op Assessment: Report given to PACU RN and Post -op Vital signs reviewed and stable  Post vital signs: Reviewed and stable  Complications: No apparent anesthesia complications

## 2012-03-06 NOTE — Anesthesia Preprocedure Evaluation (Signed)
Anesthesia Evaluation  Patient identified by MRN, date of birth, ID band Patient awake    Reviewed: Allergy & Precautions, H&P , NPO status , Patient's Chart, lab work & pertinent test results  History of Anesthesia Complications Negative for: history of anesthetic complications  Airway Mallampati: II TM Distance: >3 FB Neck ROM: Full    Dental No notable dental hx. (+) Edentulous Upper and Dental Advisory Given   Pulmonary Current Smoker,  breath sounds clear to auscultation  Pulmonary exam normal       Cardiovascular hypertension, + CAD (cath '11: mild-mod ASCADz, normal LVF) and + Peripheral Vascular Disease (s/p AAA repair) Rhythm:Regular Rate:Normal     Neuro/Psych negative neurological ROS     GI/Hepatic Neg liver ROS, GERD-  Medicated and Controlled,  Endo/Other  Diabetes mellitus- (borderline, diet management)  Renal/GU negative Renal ROS     Musculoskeletal   Abdominal   Peds  Hematology negative hematology ROS (+)   Anesthesia Other Findings   Reproductive/Obstetrics                           Anesthesia Physical Anesthesia Plan  ASA: III  Anesthesia Plan: General   Post-op Pain Management:    Induction: Intravenous  Airway Management Planned: LMA  Additional Equipment:   Intra-op Plan:   Post-operative Plan:   Informed Consent: I have reviewed the patients History and Physical, chart, labs and discussed the procedure including the risks, benefits and alternatives for the proposed anesthesia with the patient or authorized representative who has indicated his/her understanding and acceptance.   Dental advisory given  Plan Discussed with: CRNA and Surgeon  Anesthesia Plan Comments: (Plan routine monitors, GA- LMA OK)        Anesthesia Quick Evaluation

## 2012-03-06 NOTE — Interval H&P Note (Signed)
History and Physical Interval Note:  03/06/2012 8:40 AM  Allen Cortez  has presented today for surgery, with the diagnosis of ventral hernia  The various methods of treatment have been discussed with the patient and family. After consideration of risks, benefits and other options for treatment, the patient has consented to  Procedure(s) (LRB): HERNIA REPAIR VENTRAL ADULT (N/A) INSERTION OF MESH (N/A) as a surgical intervention .  The patients' history has been reviewed, patient examined, no change in status, stable for surgery.  I have reviewed the patients' chart and labs.  Questions were answered to the patient's satisfaction.     [name]

## 2012-03-08 ENCOUNTER — Encounter (HOSPITAL_BASED_OUTPATIENT_CLINIC_OR_DEPARTMENT_OTHER): Payer: Self-pay | Admitting: Surgery

## 2012-03-12 ENCOUNTER — Encounter (HOSPITAL_BASED_OUTPATIENT_CLINIC_OR_DEPARTMENT_OTHER): Payer: Self-pay

## 2012-03-16 ENCOUNTER — Telehealth (INDEPENDENT_AMBULATORY_CARE_PROVIDER_SITE_OTHER): Payer: Self-pay

## 2012-03-16 NOTE — Telephone Encounter (Signed)
Left message for patient to call back so I can let him know I had to change his appointment time on 6/25 from 9:00 to 2:30pm.

## 2012-03-27 ENCOUNTER — Encounter (INDEPENDENT_AMBULATORY_CARE_PROVIDER_SITE_OTHER): Payer: PRIVATE HEALTH INSURANCE | Admitting: Surgery

## 2012-04-03 ENCOUNTER — Encounter (INDEPENDENT_AMBULATORY_CARE_PROVIDER_SITE_OTHER): Payer: Self-pay

## 2012-04-06 ENCOUNTER — Ambulatory Visit (INDEPENDENT_AMBULATORY_CARE_PROVIDER_SITE_OTHER): Payer: PRIVATE HEALTH INSURANCE | Admitting: Surgery

## 2012-04-06 ENCOUNTER — Encounter (INDEPENDENT_AMBULATORY_CARE_PROVIDER_SITE_OTHER): Payer: Self-pay | Admitting: Surgery

## 2012-04-06 VITALS — BP 131/83 | HR 80 | Temp 98.5°F | Ht 70.0 in | Wt 152.2 lb

## 2012-04-06 DIAGNOSIS — Z9889 Other specified postprocedural states: Secondary | ICD-10-CM

## 2012-04-06 NOTE — Progress Notes (Signed)
Pt returns after excision of stitch granuloma from left lower quadrant.  He is doing well.  No complaints.  Exam:  Lower abdominal incision clean ,  Dry,  Intact.  Impression:   S/p excision of lower abdominal wall stitch granuloma  Plan:   RTC  PRN

## 2012-04-06 NOTE — Patient Instructions (Signed)
Return as needed

## 2012-09-21 ENCOUNTER — Other Ambulatory Visit: Payer: Self-pay | Admitting: Gastroenterology

## 2013-07-07 ENCOUNTER — Encounter: Payer: Self-pay | Admitting: *Deleted

## 2013-07-07 DIAGNOSIS — G4730 Sleep apnea, unspecified: Secondary | ICD-10-CM | POA: Insufficient documentation

## 2013-07-07 DIAGNOSIS — IMO0002 Reserved for concepts with insufficient information to code with codable children: Secondary | ICD-10-CM | POA: Insufficient documentation

## 2013-07-07 DIAGNOSIS — F329 Major depressive disorder, single episode, unspecified: Secondary | ICD-10-CM | POA: Insufficient documentation

## 2013-07-07 DIAGNOSIS — F32A Depression, unspecified: Secondary | ICD-10-CM | POA: Insufficient documentation

## 2013-07-07 DIAGNOSIS — I714 Abdominal aortic aneurysm, without rupture, unspecified: Secondary | ICD-10-CM | POA: Insufficient documentation

## 2013-07-07 DIAGNOSIS — G709 Myoneural disorder, unspecified: Secondary | ICD-10-CM | POA: Insufficient documentation

## 2013-07-07 DIAGNOSIS — K635 Polyp of colon: Secondary | ICD-10-CM | POA: Insufficient documentation

## 2013-07-07 DIAGNOSIS — E785 Hyperlipidemia, unspecified: Secondary | ICD-10-CM | POA: Insufficient documentation

## 2013-07-07 DIAGNOSIS — M8580 Other specified disorders of bone density and structure, unspecified site: Secondary | ICD-10-CM | POA: Insufficient documentation

## 2013-07-07 DIAGNOSIS — I739 Peripheral vascular disease, unspecified: Secondary | ICD-10-CM | POA: Insufficient documentation

## 2013-07-07 DIAGNOSIS — K219 Gastro-esophageal reflux disease without esophagitis: Secondary | ICD-10-CM | POA: Insufficient documentation

## 2013-07-07 DIAGNOSIS — C4491 Basal cell carcinoma of skin, unspecified: Secondary | ICD-10-CM | POA: Insufficient documentation

## 2013-07-07 DIAGNOSIS — G2581 Restless legs syndrome: Secondary | ICD-10-CM | POA: Insufficient documentation

## 2013-07-15 ENCOUNTER — Ambulatory Visit: Payer: PRIVATE HEALTH INSURANCE | Admitting: Interventional Cardiology

## 2013-07-15 ENCOUNTER — Encounter: Payer: Self-pay | Admitting: Interventional Cardiology

## 2013-07-15 DIAGNOSIS — F17200 Nicotine dependence, unspecified, uncomplicated: Secondary | ICD-10-CM | POA: Insufficient documentation

## 2013-07-15 DIAGNOSIS — I2510 Atherosclerotic heart disease of native coronary artery without angina pectoris: Secondary | ICD-10-CM | POA: Insufficient documentation

## 2014-01-21 ENCOUNTER — Encounter: Payer: Self-pay | Admitting: Interventional Cardiology

## 2014-04-09 ENCOUNTER — Observation Stay (HOSPITAL_COMMUNITY)
Admission: EM | Admit: 2014-04-09 | Discharge: 2014-04-09 | Disposition: A | Discharge status: Home / Self Care | Payer: Non-veteran care | Attending: Psychiatry | Admitting: Psychiatry

## 2014-04-09 ENCOUNTER — Encounter (HOSPITAL_COMMUNITY): Payer: Self-pay | Admitting: *Deleted

## 2014-04-09 ENCOUNTER — Encounter (HOSPITAL_COMMUNITY): Payer: Self-pay | Admitting: Emergency Medicine

## 2014-04-09 ENCOUNTER — Observation Stay (HOSPITAL_COMMUNITY)
Admission: AD | Admit: 2014-04-09 | Discharge: 2014-04-10 | Disposition: A | Discharge status: Home / Self Care | Payer: Non-veteran care | Source: Intra-hospital | Attending: Psychiatry | Admitting: Psychiatry

## 2014-04-09 DIAGNOSIS — R45851 Suicidal ideations: Secondary | ICD-10-CM | POA: Diagnosis not present

## 2014-04-09 DIAGNOSIS — I739 Peripheral vascular disease, unspecified: Secondary | ICD-10-CM | POA: Insufficient documentation

## 2014-04-09 DIAGNOSIS — F332 Major depressive disorder, recurrent severe without psychotic features: Secondary | ICD-10-CM | POA: Diagnosis not present

## 2014-04-09 DIAGNOSIS — F17200 Nicotine dependence, unspecified, uncomplicated: Secondary | ICD-10-CM | POA: Diagnosis not present

## 2014-04-09 DIAGNOSIS — E119 Type 2 diabetes mellitus without complications: Secondary | ICD-10-CM | POA: Diagnosis not present

## 2014-04-09 DIAGNOSIS — Z79899 Other long term (current) drug therapy: Secondary | ICD-10-CM | POA: Insufficient documentation

## 2014-04-09 DIAGNOSIS — E785 Hyperlipidemia, unspecified: Secondary | ICD-10-CM | POA: Diagnosis not present

## 2014-04-09 DIAGNOSIS — F329 Major depressive disorder, single episode, unspecified: Secondary | ICD-10-CM | POA: Diagnosis present

## 2014-04-09 DIAGNOSIS — Z85528 Personal history of other malignant neoplasm of kidney: Secondary | ICD-10-CM | POA: Diagnosis not present

## 2014-04-09 DIAGNOSIS — G2581 Restless legs syndrome: Secondary | ICD-10-CM | POA: Diagnosis not present

## 2014-04-09 DIAGNOSIS — K219 Gastro-esophageal reflux disease without esophagitis: Secondary | ICD-10-CM | POA: Insufficient documentation

## 2014-04-09 DIAGNOSIS — I2510 Atherosclerotic heart disease of native coronary artery without angina pectoris: Secondary | ICD-10-CM | POA: Insufficient documentation

## 2014-04-09 DIAGNOSIS — G4730 Sleep apnea, unspecified: Secondary | ICD-10-CM | POA: Insufficient documentation

## 2014-04-09 DIAGNOSIS — F333 Major depressive disorder, recurrent, severe with psychotic symptoms: Principal | ICD-10-CM | POA: Insufficient documentation

## 2014-04-09 LAB — COMPREHENSIVE METABOLIC PANEL
ALK PHOS: 80 U/L (ref 39–117)
ALT: 10 U/L (ref 0–53)
ANION GAP: 16 — AB (ref 5–15)
AST: 16 U/L (ref 0–37)
Albumin: 3.9 g/dL (ref 3.5–5.2)
BILIRUBIN TOTAL: 0.3 mg/dL (ref 0.3–1.2)
BUN: 13 mg/dL (ref 6–23)
CHLORIDE: 95 meq/L — AB (ref 96–112)
CO2: 21 meq/L (ref 19–32)
Calcium: 9 mg/dL (ref 8.4–10.5)
Creatinine, Ser: 1.15 mg/dL (ref 0.50–1.35)
GFR, EST AFRICAN AMERICAN: 76 mL/min — AB (ref >90)
GFR, EST NON AFRICAN AMERICAN: 65 mL/min — AB (ref >90)
GLUCOSE: 93 mg/dL (ref 70–99)
POTASSIUM: 4 meq/L (ref 3.7–5.3)
Sodium: 132 mEq/L — ABNORMAL LOW (ref 137–147)
TOTAL PROTEIN: 7.3 g/dL (ref 6.0–8.3)

## 2014-04-09 LAB — CBC
HEMATOCRIT: 43.8 % (ref 39.0–52.0)
HEMOGLOBIN: 15.1 g/dL (ref 13.0–17.0)
MCH: 31.2 pg (ref 26.0–34.0)
MCHC: 34.5 g/dL (ref 30.0–36.0)
MCV: 90.5 fL (ref 78.0–100.0)
Platelets: 206 10*3/uL (ref 150–400)
RBC: 4.84 MIL/uL (ref 4.22–5.81)
RDW: 12.8 % (ref 11.5–15.5)
WBC: 6.9 10*3/uL (ref 4.0–10.5)

## 2014-04-09 LAB — RAPID URINE DRUG SCREEN, HOSP PERFORMED
Amphetamines: NOT DETECTED
BARBITURATES: NOT DETECTED
Benzodiazepines: NOT DETECTED
Cocaine: NOT DETECTED
Opiates: NOT DETECTED
TETRAHYDROCANNABINOL: NOT DETECTED

## 2014-04-09 LAB — ETHANOL: Alcohol, Ethyl (B): 134 mg/dL — ABNORMAL HIGH (ref 0–11)

## 2014-04-09 MED ORDER — AMLODIPINE BESYLATE 10 MG PO TABS
10.0000 mg | ORAL_TABLET | Freq: Every evening | ORAL | Status: DC
  Administered 2014-04-10: 10 mg via ORAL
  Filled 2014-04-09: qty 2
  Filled 2014-04-09: qty 1

## 2014-04-09 MED ORDER — ALUM & MAG HYDROXIDE-SIMETH 200-200-20 MG/5ML PO SUSP: 30.0000 mL | ORAL | Status: DC | PRN

## 2014-04-09 MED ORDER — ADULT MULTIVITAMIN W/MINERALS CH
1.0000 | ORAL_TABLET | Freq: Every evening | ORAL | Status: DC
  Administered 2014-04-10: 1 via ORAL
  Filled 2014-04-09 (×2): qty 1

## 2014-04-09 MED ORDER — CLONAZEPAM 0.5 MG PO TABS
2.0000 mg | ORAL_TABLET | Freq: Every day | ORAL | Status: DC
  Administered 2014-04-09: 2 mg via ORAL
  Filled 2014-04-09: qty 4

## 2014-04-09 MED ORDER — TRAMADOL HCL 50 MG PO TABS: 50.0000 mg | ORAL_TABLET | Freq: Four times a day (QID) | ORAL | Status: DC | PRN

## 2014-04-09 MED ORDER — SIMVASTATIN 20 MG PO TABS
20.0000 mg | ORAL_TABLET | Freq: Every day | ORAL | Status: DC
  Administered 2014-04-09: 20 mg via ORAL
  Filled 2014-04-09 (×3): qty 1

## 2014-04-09 MED ORDER — AMLODIPINE BESYLATE 10 MG PO TABS
10.0000 mg | ORAL_TABLET | Freq: Every evening | ORAL | Status: DC
  Administered 2014-04-09: 10 mg via ORAL
  Filled 2014-04-09: qty 1

## 2014-04-09 MED ORDER — ACETAMINOPHEN 325 MG PO TABS: 650.0000 mg | ORAL_TABLET | Freq: Four times a day (QID) | ORAL | Status: DC | PRN

## 2014-04-09 MED ORDER — SIMVASTATIN 20 MG PO TABS
20.0000 mg | ORAL_TABLET | Freq: Every day | ORAL | Status: DC
  Filled 2014-04-09: qty 1

## 2014-04-09 MED ORDER — ADULT MULTIVITAMIN W/MINERALS CH
1.0000 | ORAL_TABLET | Freq: Every evening | ORAL | Status: DC
  Administered 2014-04-09: 1 via ORAL
  Filled 2014-04-09: qty 1

## 2014-04-09 MED ORDER — PANTOPRAZOLE SODIUM 40 MG PO TBEC
40.0000 mg | DELAYED_RELEASE_TABLET | Freq: Every day | ORAL | Status: DC
  Administered 2014-04-10: 40 mg via ORAL
  Filled 2014-04-09 (×3): qty 1

## 2014-04-09 MED ORDER — CLONAZEPAM 0.5 MG PO TABS: 2.0000 mg | ORAL_TABLET | Freq: Every day | ORAL | Status: DC

## 2014-04-09 MED ORDER — CITALOPRAM HYDROBROMIDE 40 MG PO TABS
40.0000 mg | ORAL_TABLET | Freq: Every evening | ORAL | Status: DC
  Administered 2014-04-10: 40 mg via ORAL
  Filled 2014-04-09: qty 2
  Filled 2014-04-09: qty 1

## 2014-04-09 MED ORDER — PANTOPRAZOLE SODIUM 40 MG PO TBEC
40.0000 mg | DELAYED_RELEASE_TABLET | Freq: Every day | ORAL | Status: DC
  Administered 2014-04-09: 40 mg via ORAL
  Filled 2014-04-09: qty 1

## 2014-04-09 MED ORDER — CITALOPRAM HYDROBROMIDE 40 MG PO TABS
40.0000 mg | ORAL_TABLET | Freq: Every evening | ORAL | Status: DC
  Administered 2014-04-09: 40 mg via ORAL
  Filled 2014-04-09: qty 1

## 2014-04-09 MED ORDER — MAGNESIUM HYDROXIDE 400 MG/5ML PO SUSP: 30.0000 mL | Freq: Every day | ORAL | Status: DC | PRN
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
## 2014-04-09 NOTE — Progress Notes (Signed)
Allen Cortez INPATIENT:  Family/Significant Other Suicide Prevention Education  Suicide Prevention Education:  Education Completed; Allen Cortez (son) and Allen Cortez (wife) has been identified by the patient as the family member/significant other with whom the patient will be residing, and identified as the person(s) who will aid the patient in the event of a mental health crisis (suicidal ideations/suicide attempt).  With written consent from the patient, the family member/significant other has been provided the following suicide prevention education, prior to the and/or following the discharge of the patient.  The suicide prevention education provided includes the following:  Suicide risk factors  Suicide prevention and interventions  National Suicide Hotline telephone number  [facility] assessment telephone number  [facility] Emergency Assistance [place] and/or Residential Mobile Crisis Unit telephone number  Request made of family/significant other to:  Remove weapons (e.g., guns, rifles, knives), all items previously/currently identified as safety concern.    Remove drugs/medications (over-the-counter, prescriptions, illicit drugs), all items previously/currently identified as a safety concern.  The family member/significant other verbalizes understanding of the suicide prevention education information provided.  The family member/significant other agrees to remove the items of safety concern listed above.  Apolinar Junes 04/09/2014, 11:18 PM

## 2014-04-09 NOTE — ED Notes (Addendum)
Pt belongings: black hat, brown flip flops, green shorts,cigarettes, blue shirt, medications. Pt wife is taking the belongings home.

## 2014-04-09 NOTE — ED Notes (Signed)
Allen Cortez [phone]  Wife: [name] [phone]
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
## 2014-04-09 NOTE — ED Notes (Signed)
[facility] unable to take patient at this time due to other admissions coming to there unit.

## 2014-04-09 NOTE — Plan of Care (Signed)
[place] Observation Crisis Plan  Reason for Crisis Plan:  Crisis Stabilization   Plan of Care:  Referral for Inpatient Hospitalization  Family Support: yes wife and son     Current Living Environment:  Living Arrangements: Spouse/significant other  Insurance:   Hospital Account   Name Acct ID Class Status Primary Coverage   Pietro, Moffet [id number] [place]        Guarantor Account (for Hospital Account [id number])   Name Relation to Pt Service Area Active? Acct Type   [name] Self [facility] Yes Behavioral Health   Address Phone       [address] [place], [place] [address] [phone]          Coverage Information (for Hospital Account [id number])   F/O Payor/Plan Precert #   [id number]    Subscriber Subscriber #   Rajkumar, Duca [id number]   Address [place]   [address] [place] [address] [phone]      Legal Guardian:   none  Primary Care Provider:  Shirline Frees, MD  Current Out[facility] Providers:  [facility]  Psychiatrist:   none  Counselor/Therapist:   none  Compliant with Medications:  Yes  Additional Information:   Apolinar Junes 7/8/20158:46 PM

## 2014-04-09 NOTE — Progress Notes (Signed)
Patient ID: Allen Cortez, male   DOB: 08-Oct-1950, 63 y.o.   MRN: [id number] Pt admitted to Observation Unit. Pt Denies SI/HI -A/V hall at present to Probation officer and verbally contracts for safety. Pt wife/ son present and supportive. Pt states been off klonopin for 2 or 3days and started feeling  "nervous and mind racing". Will monitor closely and evaluate for stabilization.

## 2014-04-10 DIAGNOSIS — F332 Major depressive disorder, recurrent severe without psychotic features: Secondary | ICD-10-CM

## 2014-04-10 NOTE — Consult Note (Signed)
Face to face evaluation and I agree with this note 

## 2014-04-10 NOTE — BH Assessment (Signed)
[place] Assessment Progress Note  Pt has signed Consent to Release Information to his with, Gould Snide, his son, Asah Ramsier, and to the [facility].  I then spoke to [employer] on the phone.  She reports that Patience Musca at the [facility]  [phone]) has been involved with the pt during his current episode.  Ms Kerney provided Angie's phone number.  At 14:25 I called Angie, who is the facility's suicide Merchandiser, retail.  I informed her of the pt's current status, and she took my name, contact information and office hours.  She reports that an intake coordinator from their facility will be in touch with me.  I also provided Therapeutic Triage contact information for after hours needs.  As of this writing I am awaiting a call from them.  Jalene Mullet, MA Triage Specialist 04/10/2014 @ 14:34

## 2014-04-10 NOTE — Plan of Care (Addendum)
[facility] Observation Crisis Plan  Reason for Crisis Plan:  Crisis Stabilization   Plan of Care:  Refer to [facility] providers in [place] and/or [place] Support:    Dashaun Daskal (wife) and Dijon Mcmeans (son)  Current Living Environment:  Living Arrangements: Spouse/significant other; pt may return to the household  Insurance:  Pt reports that he has VA benefits and no other third party payor at this time. Hospital Account   Name Acct ID Class Status Primary Coverage   Jarryd, Cieslinski [id number] [facility]        Guarantor Account (for Hospital Account [id number])   Name Relation to Pt Service Area Active? Acct Type   [name] Self [facility] Yes Behavioral Health   Address Phone       [address] [place], [place] [address] [phone]          Coverage Information (for Hospital Account [id number])   F/O Payor/Plan Precert #   [id number]    Subscriber Subscriber #   Zahier, Lojewski [id number]   Address Phone   [place] [address] [phone]      Legal Guardian:   Self  Primary Care Provider:  Dr Greer Pickerel at the [facility]  Current Outpatient Providers:  None  Psychiatrist:   None  Counselor/Therapist:   None  Compliant with Medications:  Yes  Additional Information: After consulting with Llana Aliment, NP it has been determined that pt does not pose a life threatening danger to himself or others, and that psychiatric hospitalization is not indicated for him at this time.  She spoke to the wife by telephone; the wife agrees to remove firearms from the household.  I then called Patience Musca, suicide prevention coordinator at the [facility] [phone]), with whom the family has been in contact.  She reports that she will call the family and contact an appropriate [facility] outpatient clinic to establish outpatient follow-up, possibly as  soon as tomorrow.  This phone number will be included in pt's discharge instructions for follow up.  Pt signed a Theatre manager.  Jalene Mullet, [place] Triage Specialist  Ysidro Evert E[place] 7/9/20154:34 PM
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
## 2014-04-10 NOTE — Discharge Instructions (Addendum)
The Observation Unit staff has been in contact with Patience Musca, the Suicide Prevention Coordinator for the [facility] system.  She will arrange for you to receive outpatient behavioral health treatment at a nearby [place] clinic as soon as possible.  She will be in touch with you and your family in the very near future.  The telephone number below will provide access to the [facility], available 24 hours a day, 7 days a week, as well as to Patience Musca:  Drew [phone]  If you have a behavioral health crisis you have several other options, all of which are available 24 hours a day, 7 days a week:  *Call the phone numbers on your No-Harm Contract: [phone] or toll free [phone] *Call Mobile Crisis and a clinician will come to you: [phone] *Call 911 *Go to the [facility] [place]. [place], [place] *Go to your local hospital emergency department

## 2014-04-12 NOTE — Discharge Summary (Signed)
Case discussed, firearms removed from home and patient's wife plans to monitor patient. Patient can be discharged in wife's care.

## 2014-05-01 ENCOUNTER — Telehealth: Payer: Self-pay | Admitting: Interventional Cardiology

## 2014-05-01 NOTE — Telephone Encounter (Signed)
Walk in pt Form " Dept Of vet affairs" Form Dropped Off gave to amy V 7.30.15/km

## 2014-05-15 ENCOUNTER — Telehealth: Payer: Self-pay | Admitting: Interventional Cardiology

## 2014-05-15 NOTE — Telephone Encounter (Signed)
Pt aware Dept Of Vet papers ready For Pick up 8.13.15/km
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
## 2022-04-20 ENCOUNTER — Other Ambulatory Visit: Payer: Self-pay | Admitting: *Deleted

## 2022-04-21 ENCOUNTER — Ambulatory Visit (INDEPENDENT_AMBULATORY_CARE_PROVIDER_SITE_OTHER): Payer: No Typology Code available for payment source | Admitting: Pulmonary Disease

## 2022-04-21 ENCOUNTER — Encounter: Payer: Self-pay | Admitting: Pulmonary Disease

## 2022-04-21 VITALS — BP 132/76 | HR 65 | Temp 97.5°F | Ht 70.0 in | Wt 167.2 lb

## 2022-04-21 DIAGNOSIS — J849 Interstitial pulmonary disease, unspecified: Secondary | ICD-10-CM

## 2022-04-21 DIAGNOSIS — F17210 Nicotine dependence, cigarettes, uncomplicated: Secondary | ICD-10-CM | POA: Diagnosis not present

## 2022-04-21 LAB — CBC WITH DIFFERENTIAL/PLATELET
Basophils Absolute: 0.1 10*3/uL (ref 0.0–0.1)
Basophils Relative: 1.4 % (ref 0.0–3.0)
Eosinophils Absolute: 0.3 10*3/uL (ref 0.0–0.7)
Eosinophils Relative: 5.2 % — ABNORMAL HIGH (ref 0.0–5.0)
HCT: 40.3 % (ref 39.0–52.0)
Hemoglobin: 13.2 g/dL (ref 13.0–17.0)
Lymphocytes Relative: 24.9 % (ref 12.0–46.0)
Lymphs Abs: 1.4 10*3/uL (ref 0.7–4.0)
MCHC: 32.8 g/dL (ref 30.0–36.0)
MCV: 88.3 fl (ref 78.0–100.0)
Monocytes Absolute: 0.6 10*3/uL (ref 0.1–1.0)
Monocytes Relative: 11.2 % (ref 3.0–12.0)
Neutro Abs: 3.1 10*3/uL (ref 1.4–7.7)
Neutrophils Relative %: 57.3 % (ref 43.0–77.0)
Platelets: 172 10*3/uL (ref 150.0–400.0)
RBC: 4.57 Mil/uL (ref 4.22–5.81)
RDW: 17.3 % — ABNORMAL HIGH (ref 11.5–15.5)
WBC: 5.5 10*3/uL (ref 4.0–10.5)

## 2022-04-21 NOTE — Progress Notes (Signed)
Allen Cortez    [id number]    1950-10-05  Primary Care Physician:Harris, Gwyndolyn Saxon, MD  Referring Physician: Gerome Sam, MD [address] [place],  [place] [address]  Chief complaint: Consult for abnormal CT, evaluation for interstitial lung  HPI: 71 y.o. who  has a past medical history of AAA (abdominal aortic aneurysm) ([place]), Abdominal wall mass of left lower quadrant, Basal cell carcinoma, CAD (coronary artery disease) (2011), Colon polyp, DDD (degenerative disc disease), Depression, Diabetes mellitus type II, GERD (gastroesophageal reflux disease), Hyperlipidemia, Neuromuscular disorder ([place]), Osteopenia, PAD (peripheral artery disease) ([place]), Renal cell carcinoma, Restless leg syndrome, and Sleep apnea.   He is a patient at the [facility].  He has been referred here for abnormal CT.  As per the patient he had been getting screening CTs of the chest and was told that he may have interstitial lung disease, emphysema and has been sent here for evaluation.  He is an active smoker, not on any inhalers.  Has mild dyspnea on exertion and occasional cough.  Pets: Dog Occupation: Retired.  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 Exposures: Exposure to agent orange while in [place].  No mold, hot tub, Jacuzzi.  No feather pillows or comforters Smoking history: 90-pack-year smoker.  Continues to smoke half pack per day Travel history: No significant travel history Relevant family history: No family history of lung disease   Outpatient Encounter Medications as of 04/21/2022  Medication Sig   amLODipine (NORVASC) 10 MG tablet Take 10 mg by mouth every evening.   aspirin EC (ASPIR-LOW) 81 MG tablet    citalopram (CELEXA) 40 MG tablet Take 40 mg by mouth every evening.   clonazePAM (KLONOPIN) 0.5 MG tablet    gabapentin (NEURONTIN) 300 MG capsule    lansoprazole (PREVACID) 15 MG capsule     pravastatin (PRAVACHOL) 20 MG tablet    QUEtiapine (SEROQUEL) 300 MG tablet    tretinoin (RETIN-A) 0.1 % cream    [DISCONTINUED] clonazePAM (KLONOPIN) 2 MG tablet Take 2 mg by mouth at bedtime.    [DISCONTINUED] Multiple Vitamin (MULTIVITAMIN WITH MINERALS) TABS tablet Take 1 tablet by mouth every evening.   [DISCONTINUED] omeprazole (PRILOSEC) 20 MG capsule Take 20 mg by mouth daily as needed (reflux, heartburn).    [DISCONTINUED] simvastatin (ZOCOR) 40 MG tablet Take 20 mg by mouth at bedtime.   [DISCONTINUED] traMADol (ULTRAM) 50 MG tablet Take 50-100 mg by mouth every 6 (six) hours as needed for moderate pain.   No facility-administered encounter medications on file as of 04/21/2022.    Allergies as of 04/21/2022 - Review Complete 04/21/2022  Allergen Reaction Noted   Dilaudid [hydromorphone hcl] Other (See Comments) 01/06/2012   Mirapex [pramipexole dihydrochloride] Other (See Comments) 01/06/2012   Prednisone Other (See Comments) 01/06/2012    Past Medical History:  Diagnosis Date   AAA (abdominal aortic aneurysm) ([facility])    Abdominal wall mass of left lower quadrant    Basal cell carcinoma    CAD (coronary artery disease) 2011   moderate   Colon polyp    DDD (degenerative disc disease)    Depression    Diabetes mellitus type II    no meds   GERD (gastroesophageal reflux disease)    Hyperlipidemia    Neuromuscular disorder ([facility])    Osteopenia    PAD (peripheral artery disease) ([facility])    Renal cell carcinoma    Restless leg syndrome  Sleep apnea    used a cpap yr ago-does not snore or use it now.    Past Surgical History:  Procedure Laterality Date   ABDOMINAL AORTIC ANEURYSM REPAIR     HEMORRHOID SURGERY     HERNIA REPAIR  03/06/12   ventral hernia repair   NEPHRECTOMY     left due to renal cell carcenoma   VENTRAL HERNIA REPAIR  03/06/2012   Procedure: HERNIA REPAIR VENTRAL ADULT;  Surgeon: Joyice Faster. Cornett, MD;  Location: [facility];  Service:  General;  Laterality: N/A;  excision of stitch granuloma    Family History  Problem Relation Age of Onset   Diabetes Mother    Aneurysm Mother        in brain   Cancer Father        lung   Depression Father    Cancer Sister        lung, breast   Pancreatitis Brother    Heart disease Sister        MI   Depression Sister     Social History   Socioeconomic History   Marital status: Married    Spouse name: Not on file   Number of children: Not on file   Years of education: Not on file   Highest education level: Not on file  Occupational History   Not on file  Tobacco Use   Smoking status: Every Day    Packs/day: 1.00    Types: Cigarettes   Smokeless tobacco: Never  Substance and Sexual Activity   Alcohol use: Yes    Comment: glass of wine at night   Drug use: No   Sexual activity: Not on file  Other Topics Concern   Not on file  Social History Narrative   Not on file   Social Determinants of Health   Financial Resource Strain: Not on file  Food Insecurity: Not on file  Transportation Needs: Not on file  Physical Activity: Not on file  Stress: Not on file  Social Connections: Not on file  Intimate Partner Violence: Not on file    Review of systems: Review of Systems  Constitutional: Negative for fever and chills.  HENT: Negative.   Eyes: Negative for blurred vision.  Respiratory: as per HPI  Cardiovascular: Negative for chest pain and palpitations.  Gastrointestinal: Negative for vomiting, diarrhea, blood per rectum. Genitourinary: Negative for dysuria, urgency, frequency and hematuria.  Musculoskeletal: Negative for myalgias, back pain and joint pain.  Skin: Negative for itching and rash.  Neurological: Negative for dizziness, tremors, focal weakness, seizures and loss of consciousness.  Endo/Heme/Allergies: Negative for environmental allergies.  Psychiatric/Behavioral: Negative for depression, suicidal ideas and hallucinations.  All other systems  reviewed and are negative.  Physical Exam: Blood pressure 132/76, pulse 65, temperature (!) 97.5 F (36.4 C), temperature source Oral, height '5\' 10"'$  (1.778 m), weight 167 lb 3.2 oz (75.8 kg), SpO2 97 %. Gen:      No acute distress HEENT:  EOMI, sclera anicteric Neck:     No masses; no thyromegaly Lungs:    Clear to auscultation bilaterally; normal respiratory effort CV:         Regular rate and rhythm; no murmurs Abd:      + bowel sounds; soft, non-tender; no palpable masses, no distension Ext:    No edema; adequate peripheral perfusion Skin:      Warm and dry; no rash Neuro: alert and oriented x 3 Psych: normal mood and affect  Data Reviewed: Imaging: CT abdomen pelvis 03/22/2022- visualized lung bases show emphysema, groundglass opacities with reticulation.  I have reviewed the images personally.  PFTs:  Labs:  Assessment:  Assessment for abnormal CT, interstitial lung disease He tells me that he is getting screening CTs of the chest but I have access to only the recent CT abdomen pelvis from June 2023 which does show some emphysematous changes and interstitial reticulation, scarring at the base.  We will get a dedicated high-resolution CT for further evaluation of the lung, schedule PFTs Check baseline labs including CBC differential, IgE, alpha-1 antitrypsin, ANA, rheumatoid factor and CCP  Active smoker Discussed smoking cessation.  He is not ready to quit yet.  Reassess at return visit.  Time spent counseling-5 minutes  OSA Intolerant of CPAP.  Plan/Recommendations: High-res CT, PFTs Labs Smoking cessation  Marshell Garfinkel MD [facility] 04/21/2022, 9:55 AM  CC: Gerome Sam*

## 2022-04-21 NOTE — Patient Instructions (Signed)
We will get some labs today including CBC differential, IgE, alpha-1 antitrypsin levels and phenotype We will check basic panel for autoimmune process Schedule high-res CT and PFTs and follow-up in clinic in 1 to 2 months for review and plan for next steps

## 2022-04-26 LAB — CYCLIC CITRUL PEPTIDE ANTIBODY, IGG: Cyclic Citrullin Peptide Ab: <16 UNITS

## 2022-04-26 LAB — ANTI-NUCLEAR AB-TITER (ANA TITER)
ANA TITER: 1:40 {titer} — ABNORMAL HIGH
ANA Titer 1: 1:40 {titer} — ABNORMAL HIGH

## 2022-04-26 LAB — IGE: IgE (Immunoglobulin E), Serum: 167 kU/L — ABNORMAL HIGH (ref <=114)

## 2022-04-26 LAB — ANA: Anti Nuclear Antibody (ANA): POSITIVE — AB

## 2022-04-26 LAB — ANTI-DNA ANTIBODY, DOUBLE-STRANDED: ds DNA Ab: <1 IU/mL

## 2022-04-26 LAB — RHEUMATOID FACTOR: Rheumatoid fact SerPl-aCnc: <14 IU/mL (ref <14)

## 2022-04-29 ENCOUNTER — Ambulatory Visit
Admission: RE | Admit: 2022-04-29 | Discharge: 2022-04-29 | Disposition: A | Discharge status: Home / Self Care | Payer: Medicare Other | Source: Ambulatory Visit | Attending: Pulmonary Disease | Admitting: Pulmonary Disease

## 2022-04-29 DIAGNOSIS — J849 Interstitial pulmonary disease, unspecified: Secondary | ICD-10-CM

## 2022-04-29 LAB — ALPHA-1 ANTITRYPSIN PHENOTYPE: A-1 Antitrypsin, Ser: 185 mg/dL (ref 83–199)

## 2022-05-31 ENCOUNTER — Encounter: Payer: Self-pay | Admitting: Pulmonary Disease

## 2022-05-31 DIAGNOSIS — I25119 Atherosclerotic heart disease of native coronary artery with unspecified angina pectoris: Secondary | ICD-10-CM | POA: Insufficient documentation

## 2022-05-31 NOTE — Progress Notes (Signed)
done

## 2022-06-07 ENCOUNTER — Emergency Department (HOSPITAL_COMMUNITY)
Admission: EM | Admit: 2022-06-07 | Discharge: 2022-06-07 | Disposition: A | Discharge status: Home / Self Care | Payer: No Typology Code available for payment source | Attending: Emergency Medicine | Admitting: Emergency Medicine

## 2022-06-07 ENCOUNTER — Other Ambulatory Visit: Payer: Self-pay

## 2022-06-07 ENCOUNTER — Encounter (HOSPITAL_COMMUNITY): Payer: Self-pay

## 2022-06-07 DIAGNOSIS — M79606 Pain in leg, unspecified: Secondary | ICD-10-CM | POA: Diagnosis not present

## 2022-06-07 DIAGNOSIS — R079 Chest pain, unspecified: Secondary | ICD-10-CM | POA: Diagnosis present

## 2022-06-07 DIAGNOSIS — Z5321 Procedure and treatment not carried out due to patient leaving prior to being seen by health care provider: Secondary | ICD-10-CM | POA: Insufficient documentation

## 2022-06-07 LAB — BASIC METABOLIC PANEL
Anion gap: 14 (ref 5–15)
BUN: 40 mg/dL — ABNORMAL HIGH (ref 8–23)
CO2: 22 mmol/L (ref 22–32)
Calcium: 9 mg/dL (ref 8.9–10.3)
Chloride: 101 mmol/L (ref 98–111)
Creatinine, Ser: 3.34 mg/dL — ABNORMAL HIGH (ref 0.61–1.24)
GFR, Estimated: 19 mL/min — ABNORMAL LOW (ref >60)
Glucose, Bld: 139 mg/dL — ABNORMAL HIGH (ref 70–99)
Potassium: 3.7 mmol/L (ref 3.5–5.1)
Sodium: 137 mmol/L (ref 135–145)

## 2022-06-07 LAB — MAGNESIUM: Magnesium: 1.9 mg/dL (ref 1.7–2.4)

## 2022-06-07 LAB — CBC WITH DIFFERENTIAL/PLATELET
Abs Immature Granulocytes: 0.03 10*3/uL (ref 0.00–0.07)
Basophils Absolute: 0.1 10*3/uL (ref 0.0–0.1)
Basophils Relative: 1 %
Eosinophils Absolute: 1.1 10*3/uL — ABNORMAL HIGH (ref 0.0–0.5)
Eosinophils Relative: 12 %
HCT: 35.3 % — ABNORMAL LOW (ref 39.0–52.0)
Hemoglobin: 11.9 g/dL — ABNORMAL LOW (ref 13.0–17.0)
Immature Granulocytes: 0 %
Lymphocytes Relative: 19 %
Lymphs Abs: 1.8 10*3/uL (ref 0.7–4.0)
MCH: 29.6 pg (ref 26.0–34.0)
MCHC: 33.7 g/dL (ref 30.0–36.0)
MCV: 87.8 fL (ref 80.0–100.0)
Monocytes Absolute: 0.6 10*3/uL (ref 0.1–1.0)
Monocytes Relative: 6 %
Neutro Abs: 6 10*3/uL (ref 1.7–7.7)
Neutrophils Relative %: 62 %
Platelets: 279 10*3/uL (ref 150–400)
RBC: 4.02 MIL/uL — ABNORMAL LOW (ref 4.22–5.81)
RDW: 14.7 % (ref 11.5–15.5)
WBC: 9.7 10*3/uL (ref 4.0–10.5)
nRBC: 0 % (ref 0.0–0.2)

## 2022-06-07 LAB — CK: Total CK: 155 U/L (ref 49–397)

## 2022-06-07 MED ORDER — ACETAMINOPHEN 325 MG PO TABS
650.0000 mg | ORAL_TABLET | Freq: Once | ORAL | Status: AC
  Administered 2022-06-07: 650 mg via ORAL
  Filled 2022-06-07: qty 2

## 2022-06-07 NOTE — ED Notes (Signed)
The BP taken at this time was completed in the right arm

## 2022-06-07 NOTE — ED Notes (Signed)
Registration handed this writer this patients labels and states they left

## 2022-06-07 NOTE — ED Triage Notes (Signed)
Patient reports he was seen at the [facility] and had a heart cath that showed 100% blockage in the front an 80% in the back but they dont do stent placement.  Patient complains of chest pain and leg pain. Patient has 4cm dilation of aortic arcch.

## 2022-06-07 NOTE — ED Provider Triage Note (Signed)
Emergency Medicine Provider Triage Evaluation Note  Allen Cortez , a 71 y.o. male  was evaluated in triage.  Pt complains of bilateral leg pain.  Progressively worsening over the past week.  Patient with history of neuropathy.  Has been on tramadol in the past.  Patient with recent heart cath about 2 weeks ago at the [place] that showed several blockages in his heart that is requiring likely open heart surgery.  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  Patient denies chest pain or shortness of breath.  Review of Systems  Positive: Bilateral leg pain Negative: Chest pain, shortness of breath, fevers, cough, paresthesias  Physical Exam  BP (!) 154/94 (BP Location: Left Arm)   Pulse 93   Temp 98 F (36.7 C) (Oral)   Resp 20   Ht '5\' 10"'$  (1.778 m)   Wt 75.8 kg   SpO2 98%   BMI 23.96 kg/m  Gen:   Awake, no distress   Resp:  Normal effort  MSK:   Moves extremities without difficulty  Other:  Patient with 2+ PT pulses bilaterally.  Does have brawny appearance to the bilateral feet.  Cap refill is normal.  Compartments are soft.  No palpable cord.  Full range of motion of all joints.  I do not feel any palpable mass to the right groin.  Medical Decision Making  Medically screening exam initiated at 5:36 PM.  Appropriate orders placed.  [name] was informed that the remainder of the evaluation will be completed by another provider, this initial triage assessment does not replace that evaluation, and the importance of remaining in the ED until their evaluation is complete.  Patient presents for bilateral leg pain.  History of neuropathy.  Patient is neurovascularly intact.  Recent heart catheterization 2 weeks ago that showed several severe blockages of his heart requiring open heart surgery where they went into his right groin.  There is no palpable pulsating mass at this time.  Could be electrolyte derangement could also  consider peripheral vascular disease or neuropathy.  Patient has no abdominal pain or chest pain to suspect dissection at this time.   Doristine Devoid, PA-C 06/07/22 1750

## 2022-06-12 ENCOUNTER — Inpatient Hospital Stay (HOSPITAL_COMMUNITY)
Admission: EM | Admit: 2022-06-12 | Discharge: 2022-06-27 | DRG: 673 | Disposition: A | Discharge status: Home Health | Payer: No Typology Code available for payment source | Attending: Internal Medicine | Admitting: Internal Medicine

## 2022-06-12 ENCOUNTER — Other Ambulatory Visit: Payer: Self-pay

## 2022-06-12 ENCOUNTER — Inpatient Hospital Stay (HOSPITAL_COMMUNITY): Payer: No Typology Code available for payment source

## 2022-06-12 ENCOUNTER — Emergency Department (HOSPITAL_COMMUNITY): Payer: No Typology Code available for payment source

## 2022-06-12 ENCOUNTER — Encounter (HOSPITAL_COMMUNITY): Payer: Self-pay | Admitting: Emergency Medicine

## 2022-06-12 DIAGNOSIS — I10 Essential (primary) hypertension: Secondary | ICD-10-CM

## 2022-06-12 DIAGNOSIS — J9621 Acute and chronic respiratory failure with hypoxia: Secondary | ICD-10-CM | POA: Diagnosis present

## 2022-06-12 DIAGNOSIS — G2581 Restless legs syndrome: Secondary | ICD-10-CM | POA: Diagnosis present

## 2022-06-12 DIAGNOSIS — I5043 Acute on chronic combined systolic (congestive) and diastolic (congestive) heart failure: Secondary | ICD-10-CM | POA: Diagnosis present

## 2022-06-12 DIAGNOSIS — E785 Hyperlipidemia, unspecified: Secondary | ICD-10-CM | POA: Diagnosis present

## 2022-06-12 DIAGNOSIS — R0609 Other forms of dyspnea: Secondary | ICD-10-CM

## 2022-06-12 DIAGNOSIS — Z905 Acquired absence of kidney: Secondary | ICD-10-CM

## 2022-06-12 DIAGNOSIS — E118 Type 2 diabetes mellitus with unspecified complications: Secondary | ICD-10-CM | POA: Diagnosis not present

## 2022-06-12 DIAGNOSIS — D631 Anemia in chronic kidney disease: Secondary | ICD-10-CM | POA: Diagnosis present

## 2022-06-12 DIAGNOSIS — G253 Myoclonus: Secondary | ICD-10-CM | POA: Diagnosis present

## 2022-06-12 DIAGNOSIS — G4733 Obstructive sleep apnea (adult) (pediatric): Secondary | ICD-10-CM | POA: Diagnosis present

## 2022-06-12 DIAGNOSIS — I25118 Atherosclerotic heart disease of native coronary artery with other forms of angina pectoris: Secondary | ICD-10-CM | POA: Diagnosis not present

## 2022-06-12 DIAGNOSIS — F32A Depression, unspecified: Secondary | ICD-10-CM | POA: Diagnosis present

## 2022-06-12 DIAGNOSIS — I513 Intracardiac thrombosis, not elsewhere classified: Secondary | ICD-10-CM | POA: Diagnosis present

## 2022-06-12 DIAGNOSIS — Z85528 Personal history of other malignant neoplasm of kidney: Secondary | ICD-10-CM

## 2022-06-12 DIAGNOSIS — N170 Acute kidney failure with tubular necrosis: Principal | ICD-10-CM | POA: Diagnosis present

## 2022-06-12 DIAGNOSIS — G4730 Sleep apnea, unspecified: Secondary | ICD-10-CM | POA: Diagnosis present

## 2022-06-12 DIAGNOSIS — K219 Gastro-esophageal reflux disease without esophagitis: Secondary | ICD-10-CM | POA: Diagnosis present

## 2022-06-12 DIAGNOSIS — Z0189 Encounter for other specified special examinations: Secondary | ICD-10-CM

## 2022-06-12 DIAGNOSIS — I132 Hypertensive heart and chronic kidney disease with heart failure and with stage 5 chronic kidney disease, or end stage renal disease: Secondary | ICD-10-CM | POA: Diagnosis present

## 2022-06-12 DIAGNOSIS — N186 End stage renal disease: Secondary | ICD-10-CM | POA: Diagnosis present

## 2022-06-12 DIAGNOSIS — I2510 Atherosclerotic heart disease of native coronary artery without angina pectoris: Secondary | ICD-10-CM | POA: Diagnosis not present

## 2022-06-12 DIAGNOSIS — F419 Anxiety disorder, unspecified: Secondary | ICD-10-CM | POA: Diagnosis present

## 2022-06-12 DIAGNOSIS — T508X5A Adverse effect of diagnostic agents, initial encounter: Secondary | ICD-10-CM | POA: Diagnosis present

## 2022-06-12 DIAGNOSIS — R471 Dysarthria and anarthria: Secondary | ICD-10-CM | POA: Diagnosis not present

## 2022-06-12 DIAGNOSIS — I255 Ischemic cardiomyopathy: Secondary | ICD-10-CM | POA: Diagnosis present

## 2022-06-12 DIAGNOSIS — Z801 Family history of malignant neoplasm of trachea, bronchus and lung: Secondary | ICD-10-CM

## 2022-06-12 DIAGNOSIS — I214 Non-ST elevation (NSTEMI) myocardial infarction: Secondary | ICD-10-CM | POA: Diagnosis not present

## 2022-06-12 DIAGNOSIS — Z803 Family history of malignant neoplasm of breast: Secondary | ICD-10-CM

## 2022-06-12 DIAGNOSIS — I444 Left anterior fascicular block: Secondary | ICD-10-CM | POA: Diagnosis present

## 2022-06-12 DIAGNOSIS — J439 Emphysema, unspecified: Secondary | ICD-10-CM | POA: Diagnosis present

## 2022-06-12 DIAGNOSIS — E440 Moderate protein-calorie malnutrition: Secondary | ICD-10-CM | POA: Diagnosis present

## 2022-06-12 DIAGNOSIS — Z85828 Personal history of other malignant neoplasm of skin: Secondary | ICD-10-CM | POA: Diagnosis not present

## 2022-06-12 DIAGNOSIS — I714 Abdominal aortic aneurysm, without rupture, unspecified: Secondary | ICD-10-CM

## 2022-06-12 DIAGNOSIS — Z8249 Family history of ischemic heart disease and other diseases of the circulatory system: Secondary | ICD-10-CM

## 2022-06-12 DIAGNOSIS — Z79899 Other long term (current) drug therapy: Secondary | ICD-10-CM | POA: Diagnosis not present

## 2022-06-12 DIAGNOSIS — F17210 Nicotine dependence, cigarettes, uncomplicated: Secondary | ICD-10-CM | POA: Diagnosis present

## 2022-06-12 DIAGNOSIS — R4701 Aphasia: Secondary | ICD-10-CM | POA: Diagnosis not present

## 2022-06-12 DIAGNOSIS — J9601 Acute respiratory failure with hypoxia: Secondary | ICD-10-CM | POA: Diagnosis not present

## 2022-06-12 DIAGNOSIS — I5021 Acute systolic (congestive) heart failure: Secondary | ICD-10-CM

## 2022-06-12 DIAGNOSIS — I5023 Acute on chronic systolic (congestive) heart failure: Secondary | ICD-10-CM | POA: Diagnosis not present

## 2022-06-12 DIAGNOSIS — I5031 Acute diastolic (congestive) heart failure: Secondary | ICD-10-CM | POA: Diagnosis present

## 2022-06-12 DIAGNOSIS — E1122 Type 2 diabetes mellitus with diabetic chronic kidney disease: Secondary | ICD-10-CM | POA: Diagnosis present

## 2022-06-12 DIAGNOSIS — Z20822 Contact with and (suspected) exposure to covid-19: Secondary | ICD-10-CM | POA: Diagnosis present

## 2022-06-12 DIAGNOSIS — N179 Acute kidney failure, unspecified: Secondary | ICD-10-CM | POA: Diagnosis not present

## 2022-06-12 DIAGNOSIS — E1169 Type 2 diabetes mellitus with other specified complication: Secondary | ICD-10-CM | POA: Diagnosis present

## 2022-06-12 DIAGNOSIS — Z833 Family history of diabetes mellitus: Secondary | ICD-10-CM

## 2022-06-12 DIAGNOSIS — E119 Type 2 diabetes mellitus without complications: Secondary | ICD-10-CM

## 2022-06-12 DIAGNOSIS — E7800 Pure hypercholesterolemia, unspecified: Secondary | ICD-10-CM

## 2022-06-12 DIAGNOSIS — N1411 Contrast-induced nephropathy: Secondary | ICD-10-CM | POA: Diagnosis present

## 2022-06-12 DIAGNOSIS — E1151 Type 2 diabetes mellitus with diabetic peripheral angiopathy without gangrene: Secondary | ICD-10-CM | POA: Diagnosis present

## 2022-06-12 DIAGNOSIS — Z7982 Long term (current) use of aspirin: Secondary | ICD-10-CM

## 2022-06-12 DIAGNOSIS — I25119 Atherosclerotic heart disease of native coronary artery with unspecified angina pectoris: Secondary | ICD-10-CM | POA: Diagnosis present

## 2022-06-12 DIAGNOSIS — Z818 Family history of other mental and behavioral disorders: Secondary | ICD-10-CM

## 2022-06-12 DIAGNOSIS — I5022 Chronic systolic (congestive) heart failure: Secondary | ICD-10-CM | POA: Diagnosis not present

## 2022-06-12 LAB — TROPONIN I (HIGH SENSITIVITY)
Troponin I (High Sensitivity): 232 ng/L (ref <18)
Troponin I (High Sensitivity): 219 ng/L (ref <18)

## 2022-06-12 LAB — PROTIME-INR
INR: 1.3 — ABNORMAL HIGH (ref 0.8–1.2)
Prothrombin Time: 15.6 seconds — ABNORMAL HIGH (ref 11.4–15.2)

## 2022-06-12 LAB — URINALYSIS, ROUTINE W REFLEX MICROSCOPIC
Bacteria, UA: NONE SEEN
Bilirubin Urine: NEGATIVE
Glucose, UA: NEGATIVE mg/dL
Ketones, ur: NEGATIVE mg/dL
Leukocytes,Ua: NEGATIVE
Nitrite: NEGATIVE
Protein, ur: 30 mg/dL — AB
Specific Gravity, Urine: 1.01 (ref 1.005–1.030)
pH: 5 (ref 5.0–8.0)

## 2022-06-12 LAB — CBC
HCT: 32.3 % — ABNORMAL LOW (ref 39.0–52.0)
Hemoglobin: 10.7 g/dL — ABNORMAL LOW (ref 13.0–17.0)
MCH: 29.7 pg (ref 26.0–34.0)
MCHC: 33.1 g/dL (ref 30.0–36.0)
MCV: 89.7 fL (ref 80.0–100.0)
Platelets: 255 10*3/uL (ref 150–400)
RBC: 3.6 MIL/uL — ABNORMAL LOW (ref 4.22–5.81)
RDW: 15.5 % (ref 11.5–15.5)
WBC: 10.7 10*3/uL — ABNORMAL HIGH (ref 4.0–10.5)
nRBC: 0 % (ref 0.0–0.2)

## 2022-06-12 LAB — BASIC METABOLIC PANEL
Anion gap: 16 — ABNORMAL HIGH (ref 5–15)
BUN: 56 mg/dL — ABNORMAL HIGH (ref 8–23)
CO2: 22 mmol/L (ref 22–32)
Calcium: 8.7 mg/dL — ABNORMAL LOW (ref 8.9–10.3)
Chloride: 101 mmol/L (ref 98–111)
Creatinine, Ser: 5.07 mg/dL — ABNORMAL HIGH (ref 0.61–1.24)
GFR, Estimated: 11 mL/min — ABNORMAL LOW (ref >60)
Glucose, Bld: 106 mg/dL — ABNORMAL HIGH (ref 70–99)
Potassium: 4.1 mmol/L (ref 3.5–5.1)
Sodium: 139 mmol/L (ref 135–145)

## 2022-06-12 LAB — I-STAT ARTERIAL BLOOD GAS, ED
Acid-base deficit: 3 mmol/L — ABNORMAL HIGH (ref 0.0–2.0)
Bicarbonate: 22 mmol/L (ref 20.0–28.0)
Calcium, Ion: 1.16 mmol/L (ref 1.15–1.40)
HCT: 31 % — ABNORMAL LOW (ref 39.0–52.0)
Hemoglobin: 10.5 g/dL — ABNORMAL LOW (ref 13.0–17.0)
O2 Saturation: 93 %
Patient temperature: 97.6
Potassium: 4.1 mmol/L (ref 3.5–5.1)
Sodium: 138 mmol/L (ref 135–145)
TCO2: 23 mmol/L (ref 22–32)
pCO2 arterial: 35.8 mmHg (ref 32–48)
pH, Arterial: 7.394 (ref 7.35–7.45)
pO2, Arterial: 65 mmHg — ABNORMAL LOW (ref 83–108)

## 2022-06-12 LAB — CBG MONITORING, ED
Glucose-Capillary: 194 mg/dL — ABNORMAL HIGH (ref 70–99)
Glucose-Capillary: 120 mg/dL — ABNORMAL HIGH (ref 70–99)

## 2022-06-12 LAB — SARS CORONAVIRUS 2 BY RT PCR: SARS Coronavirus 2 by RT PCR: NEGATIVE

## 2022-06-12 LAB — HEPARIN LEVEL (UNFRACTIONATED): Heparin Unfractionated: >1.1 IU/mL — ABNORMAL HIGH (ref 0.30–0.70)

## 2022-06-12 LAB — APTT: aPTT: 44 seconds — ABNORMAL HIGH (ref 24–36)

## 2022-06-12 LAB — SODIUM, URINE, RANDOM: Sodium, Ur: 43 mmol/L

## 2022-06-12 LAB — CREATININE, URINE, RANDOM: Creatinine, Urine: 67 mg/dL

## 2022-06-12 LAB — ECHOCARDIOGRAM COMPLETE
Area-P 1/2: 5.88 cm2
S' Lateral: 4.8 cm

## 2022-06-12 MED ORDER — SODIUM CHLORIDE 0.9 % IV SOLN: 250.0000 mL | INTRAVENOUS | Status: DC | PRN

## 2022-06-12 MED ORDER — QUETIAPINE FUMARATE 100 MG PO TABS
300.0000 mg | ORAL_TABLET | Freq: Every day | ORAL | Status: DC
  Administered 2022-06-12 – 2022-06-26 (×15): 300 mg via ORAL
  Filled 2022-06-12 (×15): qty 3

## 2022-06-12 MED ORDER — SODIUM CHLORIDE 0.9% FLUSH: 3.0000 mL | INTRAVENOUS | Status: DC | PRN

## 2022-06-12 MED ORDER — INSULIN ASPART 100 UNIT/ML IJ SOLN
0.0000 [IU] | Freq: Three times a day (TID) | INTRAMUSCULAR | Status: DC
  Administered 2022-06-12: 3 [IU] via SUBCUTANEOUS
  Administered 2022-06-15 – 2022-06-18 (×4): 2 [IU] via SUBCUTANEOUS
  Administered 2022-06-19 – 2022-06-20 (×2): 3 [IU] via SUBCUTANEOUS

## 2022-06-12 MED ORDER — GABAPENTIN 300 MG PO CAPS
300.0000 mg | ORAL_CAPSULE | Freq: Every day | ORAL | Status: DC
  Administered 2022-06-12: 300 mg via ORAL
  Filled 2022-06-12: qty 1

## 2022-06-12 MED ORDER — HEPARIN BOLUS VIA INFUSION
2000.0000 [IU] | Freq: Once | INTRAVENOUS | Status: AC
  Administered 2022-06-12: 2000 [IU] via INTRAVENOUS
  Filled 2022-06-12: qty 2000

## 2022-06-12 MED ORDER — PANTOPRAZOLE SODIUM 40 MG PO TBEC
40.0000 mg | DELAYED_RELEASE_TABLET | Freq: Every day | ORAL | Status: DC
  Administered 2022-06-12 – 2022-06-27 (×16): 40 mg via ORAL
  Filled 2022-06-12 (×16): qty 1

## 2022-06-12 MED ORDER — ONDANSETRON HCL 4 MG/2ML IJ SOLN: 4.0000 mg | Freq: Four times a day (QID) | INTRAMUSCULAR | Status: DC | PRN

## 2022-06-12 MED ORDER — ASPIRIN 81 MG PO TBEC
81.0000 mg | DELAYED_RELEASE_TABLET | Freq: Every day | ORAL | Status: DC
  Administered 2022-06-12 – 2022-06-27 (×15): 81 mg via ORAL
  Filled 2022-06-12 (×16): qty 1

## 2022-06-12 MED ORDER — PERFLUTREN LIPID MICROSPHERE
1.0000 mL | INTRAVENOUS | Status: AC | PRN
  Administered 2022-06-12: 4 mL via INTRAVENOUS

## 2022-06-12 MED ORDER — PRAVASTATIN SODIUM 40 MG PO TABS
40.0000 mg | ORAL_TABLET | Freq: Every day | ORAL | Status: DC
  Administered 2022-06-12 – 2022-06-13 (×2): 40 mg via ORAL
  Filled 2022-06-12 (×2): qty 1

## 2022-06-12 MED ORDER — HEPARIN (PORCINE) 25000 UT/250ML-% IV SOLN: 12.0000 [IU]/kg/h | INTRAVENOUS | Status: DC

## 2022-06-12 MED ORDER — DEXTROSE 5 % IV SOLN
100.0000 mg | Freq: Three times a day (TID) | INTRAVENOUS | Status: AC
Start: 2022-06-12 — End: 2022-06-12
  Administered 2022-06-12 (×2): 100 mg via INTRAVENOUS
  Filled 2022-06-12 (×2): qty 10

## 2022-06-12 MED ORDER — CITALOPRAM HYDROBROMIDE 20 MG PO TABS
40.0000 mg | ORAL_TABLET | Freq: Every evening | ORAL | Status: DC
  Administered 2022-06-12 – 2022-06-13 (×2): 40 mg via ORAL
  Filled 2022-06-12: qty 2
  Filled 2022-06-12: qty 4

## 2022-06-12 MED ORDER — HYDRALAZINE HCL 10 MG PO TABS
10.0000 mg | ORAL_TABLET | Freq: Three times a day (TID) | ORAL | Status: DC
  Administered 2022-06-12 – 2022-06-14 (×6): 10 mg via ORAL
  Filled 2022-06-12 (×6): qty 1

## 2022-06-12 MED ORDER — HEPARIN (PORCINE) 25000 UT/250ML-% IV SOLN
1600.0000 [IU]/h | INTRAVENOUS | Status: DC
  Administered 2022-06-12: 900 [IU]/h via INTRAVENOUS
  Administered 2022-06-13: 1150 [IU]/h via INTRAVENOUS
  Administered 2022-06-13 – 2022-06-21 (×9): 1350 [IU]/h via INTRAVENOUS
  Administered 2022-06-21: 1600 [IU]/h via INTRAVENOUS
  Administered 2022-06-22: 1400 [IU]/h via INTRAVENOUS
  Administered 2022-06-23 (×2): 1500 [IU]/h via INTRAVENOUS
  Administered 2022-06-24 – 2022-06-27 (×4): 1600 [IU]/h via INTRAVENOUS
  Filled 2022-06-12 (×21): qty 250

## 2022-06-12 MED ORDER — ACETAMINOPHEN 325 MG PO TABS
650.0000 mg | ORAL_TABLET | ORAL | Status: DC | PRN
  Administered 2022-06-15 – 2022-06-26 (×7): 650 mg via ORAL
  Filled 2022-06-12 (×7): qty 2

## 2022-06-12 MED ORDER — AMLODIPINE BESYLATE 10 MG PO TABS
10.0000 mg | ORAL_TABLET | Freq: Every day | ORAL | Status: DC
  Administered 2022-06-12 – 2022-06-24 (×13): 10 mg via ORAL
  Filled 2022-06-12: qty 2
  Filled 2022-06-12 (×5): qty 1
  Filled 2022-06-12: qty 2
  Filled 2022-06-12 (×6): qty 1

## 2022-06-12 MED ORDER — ISOSORBIDE MONONITRATE ER 30 MG PO TB24
15.0000 mg | ORAL_TABLET | Freq: Every day | ORAL | Status: DC
  Administered 2022-06-12 – 2022-06-27 (×15): 15 mg via ORAL
  Filled 2022-06-12 (×16): qty 1

## 2022-06-12 MED ORDER — CLONAZEPAM 0.5 MG PO TABS
0.5000 mg | ORAL_TABLET | Freq: Every day | ORAL | Status: DC
  Administered 2022-06-12 – 2022-06-27 (×16): 0.5 mg via ORAL
  Filled 2022-06-12 (×16): qty 1

## 2022-06-12 MED ORDER — SODIUM CHLORIDE 0.9% FLUSH
3.0000 mL | Freq: Two times a day (BID) | INTRAVENOUS | Status: DC
  Administered 2022-06-12 – 2022-06-27 (×24): 3 mL via INTRAVENOUS

## 2022-06-12 NOTE — Assessment & Plan Note (Deleted)
Patient had cardiac cath at [facility] 05/31/22 which revealed multi-vessel disease: 100% front of heart (LAD?) and 80% back of heart (Circ?). He was to see CV surgeon for possible CABG. Patient with mild elevation in Troponins likely related to strain from HFrEF. Cardiology has seen patient - started Imdur and hydralazine.  Plan Continue IV heparin  Continue meds as ordered by cardiology  Based on nephrology input may benefit from additional medication: BB, ARB, SGLT2

## 2022-06-12 NOTE — Assessment & Plan Note (Addendum)
Continue with once daily pantoprazole.

## 2022-06-12 NOTE — Progress Notes (Signed)
  Echocardiogram 2D Echocardiogram has been performed.  Allen Cortez 06/12/2022, 5:24 PM

## 2022-06-12 NOTE — ED Notes (Signed)
ED TO INPATIENT HANDOFF REPORT    S Name/Age/Gender Jolene Schimke Hodges 71 y.o. male Room/Bed: 028C/028C  Code Status : Full Code   Came from home: Alert and oriented x4   Chief Complaint Acute kidney injury (AKI) with acute tubular necrosis (ATN) ([place]) [F75.1] Acute diastolic heart failure ([place]) [I50.31]  Triage Note Patient arrived with EMS from home reports SOB with chest tightness , rales/crackles this evening . Denies fever or chills .    Allergies Allergies  Allergen Reactions   Dilaudid [Hydromorphone Hcl] Other (See Comments)    hallucinations   Mirapex [Pramipexole Dihydrochloride] Other (See Comments)    Leg pain   Prednisone Other (See Comments)    irritability    Level of Care/Admitting Diagnosis ED Disposition     ED Disposition  Admit   Condition  --   Lares: [place] [[id number]]  Level of Care: Telemetry Cardiac [[id number]]  May admit patient to [facility] or [facility] if equivalent level of care is available:: No  Covid Evaluation: Confirmed COVID Negative  Diagnosis: Acute diastolic heart failure ([place]) [428.31.ICD-9-CM]  Admitting Physician: Neena Rhymes [[id number]]  Attending Physician: Neena Rhymes [[id number]]  Certification:: I certify this patient will need inpatient services for at least 2 midnights          B Medical/Surgery History Past Medical History:  Diagnosis Date   AAA (abdominal aortic aneurysm) ([place])    Abdominal wall mass of left lower quadrant    Basal cell carcinoma    CAD (coronary artery disease) 2011   moderate   Colon polyp    DDD (degenerative disc disease)    Depression    Diabetes mellitus type II    no meds   GERD (gastroesophageal reflux disease)    Hyperlipidemia    Neuromuscular disorder (HCC)    Osteopenia    PAD (peripheral artery disease) (H[facility])    Renal cell carcinoma    Restless leg syndrome    Sleep apnea    used a cpap yr ago-does not snore or use it now.    Past Surgical History:  Procedure Laterality Date   ABDOMINAL AORTIC ANEURYSM REPAIR     HEMORRHOID SURGERY     HERNIA REPAIR  03/06/12   ventral hernia repair   NEPHRECTOMY     left due to renal cell carcenoma   VENTRAL HERNIA REPAIR  03/06/2012   Procedure: HERNIA REPAIR VENTRAL ADULT;  Surgeon: Joyice Faster. Cornett, MD;  Location: [facility];  Service: General;  Laterality: N/A;  excision of stitch granuloma     A IV Location/Drains/Wounds Patient Lines/Drains/Airways Status     Active Line/Drains/Airways     Name Placement date Placement time Site Days   Peripheral IV 06/12/22 18 G 1" Left Antecubital 06/12/22  0531  Antecubital  less than 1   Peripheral IV 06/12/22 18 G Right Antecubital 06/12/22  --  Antecubital  less than 1   Incision 03/06/12 Abdomen Other (Comment) 03/06/12  0941  -- 3750            Intake/Output Last 24 hours  Intake/Output Summary (Last 24 hours) at 06/12/2022 1950 Last data filed at 06/12/2022 1746 Gross per 24 hour  Intake 94.86 ml  Output --  Net 94.86 ml    Labs/Imaging Results for orders placed or performed during the hospital encounter of 06/12/22 (from the past 48 hour(s))  Basic metabolic panel     Status: Abnormal  Collection Time: 06/12/22  3:26 AM  Result Value Ref Range   Sodium 139 135 - 145 mmol/L   Potassium 4.1 3.5 - 5.1 mmol/L   Chloride 101 98 - 111 mmol/L   CO2 22 22 - 32 mmol/L   Glucose, Bld 106 (H) 70 - 99 mg/dL    Comment: Glucose reference range applies only to samples taken after fasting for at least 8 hours.   BUN 56 (H) 8 - 23 mg/dL   Creatinine, Ser 5.07 (H) 0.61 - 1.24 mg/dL   Calcium 8.7 (L) 8.9 - 10.3 mg/dL   GFR, Estimated 11 (L) >60 mL/min    Comment: (NOTE) Calculated using the CKD-EPI Creatinine Equation (2021)    Anion gap 16 (H) 5 - 15    Comment: Performed at [facility] [address] [place], [place] [address]  CBC     Status: Abnormal   Collection Time: 06/12/22  3:26  AM  Result Value Ref Range   WBC 10.7 (H) 4.0 - 10.5 K/uL   RBC 3.60 (L) 4.22 - 5.81 MIL/uL   Hemoglobin 10.7 (L) 13.0 - 17.0 g/dL   HCT 32.3 (L) 39.0 - 52.0 %   MCV 89.7 80.0 - 100.0 fL   MCH 29.7 26.0 - 34.0 pg   MCHC 33.1 30.0 - 36.0 g/dL   RDW 15.5 11.5 - 15.5 %   Platelets 255 150 - 400 K/uL   nRBC 0.0 0.0 - 0.2 %    Comment: Performed at [facility], [place] [address], [place], [place] [address]  Troponin I (High Sensitivity)     Status: Abnormal   Collection Time: 06/12/22  3:26 AM  Result Value Ref Range   Troponin I (High Sensitivity) 232 (HH) <18 ng/L    Comment: CRITICAL RESULT CALLED TO, READ BACK BY AND VERIFIED WITH R. Makailyn Mccormick, RN, [phone] 06/12/22, A. RAMSEY (NOTE) Elevated high sensitivity troponin I (hsTnI) values and significant  changes across serial measurements may suggest ACS but many other  chronic and acute conditions are known to elevate hsTnI results.  Refer to the "Links" section for chest pain algorithms and additional  guidance. Performed at [facility], [place] [address] [place], [place] [address]   Protime-INR (order if Patient is taking Coumadin / Warfarin)     Status: Abnormal   Collection Time: 06/12/22  3:26 AM  Result Value Ref Range   Prothrombin Time 15.6 (H) 11.4 - 15.2 seconds   INR 1.3 (H) 0.8 - 1.2    Comment: (NOTE) INR goal varies based on device and disease states. Performed at [facility], [place] [address], [place], [place] [address]   Troponin I (High Sensitivity)     Status: Abnormal   Collection Time: 06/12/22  5:30 AM  Result Value Ref Range   Troponin I (High Sensitivity) 219 (HH) <18 ng/L    Comment: CRITICAL VALUE NOTED.  VALUE IS CONSISTENT WITH PREVIOUSLY REPORTED AND CALLED VALUE. (NOTE) Elevated high sensitivity troponin I (hsTnI) values and significant  changes across serial measurements may suggest ACS but many other  chronic and acute conditions are known to elevate hsTnI results.  Refer to the  "Links" section for chest pain algorithms and additional  guidance. Performed at [facility], [place] [address], [place], [place] [address]   SARS Coronavirus 2 by RT PCR (hospital order, performed in [facility] hospital lab) *cepheid single result test* Anterior Nasal Swab     Status: None  Collection Time: 06/12/22  5:58 AM   Specimen: Anterior Nasal Swab  Result Value Ref Range   SARS Coronavirus 2 by RT PCR NEGATIVE NEGATIVE    Comment: (NOTE) SARS-CoV-2 target nucleic acids are NOT DETECTED.  The SARS-CoV-2 RNA is generally detectable in upper and lower respiratory specimens during the acute phase of infection. The lowest concentration of SARS-CoV-2 viral copies this assay can detect is 250 copies / mL. A negative result does not preclude SARS-CoV-2 infection and should not be used as the sole basis for treatment or other patient management decisions.  A negative result may occur with improper specimen collection / handling, submission of specimen other than nasopharyngeal swab, presence of viral mutation(s) within the areas targeted by this assay, and inadequate number of viral copies (<250 copies / mL). A negative result must be combined with clinical observations, patient history, and epidemiological information.  Fact Sheet for Patients:   https://www.patel.info/  Fact Sheet for Healthcare Providers: https://hall.com/  This test is not yet approved or  cleared by the [place] FDA and has been authorized for detection and/or diagnosis of SARS-CoV-2 by FDA under an Emergency Use Authorization (EUA).  This EUA will remain in effect (meaning this test can be used) for the duration of the COVID-19 declaration under Section 564(b)(1) of the Act, 21 U.S.C. section 360bbb-3(b)(1), unless the authorization is terminated or revoked sooner.  Performed at [facility], [place] [address], [place], [place] [address]   I-Stat  arterial blood gas, ED [facility] ED only)     Status: Abnormal   Collection Time: 06/12/22  6:24 AM  Result Value Ref Range   pH, Arterial 7.394 7.35 - 7.45   pCO2 arterial 35.8 32 - 48 mmHg   pO2, Arterial 65 (L) 83 - 108 mmHg   Bicarbonate 22.0 20.0 - 28.0 mmol/L   TCO2 23 22 - 32 mmol/L   O2 Saturation 93 %   Acid-base deficit 3.0 (H) 0.0 - 2.0 mmol/L   Sodium 138 135 - 145 mmol/L   Potassium 4.1 3.5 - 5.1 mmol/L   Calcium, Ion 1.16 1.15 - 1.40 mmol/L   HCT 31.0 (L) 39.0 - 52.0 %   Hemoglobin 10.5 (L) 13.0 - 17.0 g/dL   Patient temperature 97.6 F    Collection site RADIAL, ALLEN'S TEST ACCEPTABLE    Drawn by RT    Sample type ARTERIAL   CBG monitoring, ED     Status: Abnormal   Collection Time: 06/12/22  4:23 PM  Result Value Ref Range   Glucose-Capillary 194 (H) 70 - 99 mg/dL    Comment: Glucose reference range applies only to samples taken after fasting for at least 8 hours.  APTT     Status: Abnormal   Collection Time: 06/12/22  4:30 PM  Result Value Ref Range   aPTT 44 (H) 24 - 36 seconds    Comment:        IF BASELINE aPTT IS ELEVATED, SUGGEST PATIENT RISK ASSESSMENT BE USED TO DETERMINE APPROPRIATE ANTICOAGULANT THERAPY. Performed at [facility], [place] [address], [place], [place] [address]   Heparin level (unfractionated)     Status: Abnormal   Collection Time: 06/12/22  4:30 PM  Result Value Ref Range   Heparin Unfractionated >1.10 (H) 0.30 - 0.70 IU/mL    Comment: (NOTE) The clinical reportable range upper limit is being lowered to >1.10 to align with the FDA approved guidance for the current laboratory assay.  If heparin results are  below expected values, and patient dosage has  been confirmed, suggest follow up testing of antithrombin III levels. Performed at [facility], [place] [address], [place], [place] [address]   Urinalysis, Routine w reflex microscopic     Status: Abnormal   Collection Time: 06/12/22  5:20 PM  Result Value Ref Range   Color,  Urine YELLOW YELLOW   APPearance CLEAR CLEAR   Specific Gravity, Urine 1.010 1.005 - 1.030   pH 5.0 5.0 - 8.0   Glucose, UA NEGATIVE NEGATIVE mg/dL   Hgb urine dipstick SMALL (A) NEGATIVE   Bilirubin Urine NEGATIVE NEGATIVE   Ketones, ur NEGATIVE NEGATIVE mg/dL   Protein, ur 30 (A) NEGATIVE mg/dL   Nitrite NEGATIVE NEGATIVE   Leukocytes,Ua NEGATIVE NEGATIVE   RBC / HPF 0-5 0 - 5 RBC/hpf   WBC, UA 0-5 0 - 5 WBC/hpf   Bacteria, UA NONE SEEN NONE SEEN    Comment: Performed at [facility], [place] [address] [place], [place] [address]  Creatinine, urine, random     Status: None   Collection Time: 06/12/22  5:20 PM  Result Value Ref Range   Creatinine, Urine 67 mg/dL    Comment: Performed at [facility] [address] [place], [place] [address]  Sodium, urine, random     Status: None   Collection Time: 06/12/22  5:20 PM  Result Value Ref Range   Sodium, Ur 43 mmol/L    Comment: Performed at [facility] [address] [place], [place] [address]   ECHOCARDIOGRAM COMPLETE  Result Date: 06/12/2022    ECHOCARDIOGRAM REPORT   Patient Name:   JOSEARMANDO KUHNERT Date of Exam: 06/12/2022 Medical Rec #:  [id number]        Height:       70.0 in Accession #:    [id number]       Weight:       167.0 lb Date of Birth:  [birth year]       BSA:          1.933 m Patient Age:    13 years         BP:           163/104 mmHg Patient Gender: M                HR:           96 bpm. Exam Location:  Inpatient Procedure: 2D Echo Indications:    dyspnea. Left ventricular clot.  History:        Patient has no prior history of Echocardiogram examinations.                 Risk Factors:Dyslipidemia, Hypertension, Diabetes, Current                 Smoker and Sleep Apnea.  Sonographer:    Johny Chess RDCS Referring Phys: [id number] [place]  1. 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 Left  ventricular ejection fraction, by estimation, is 30-35%. The left ventricle is abnormal. The left ventricle has focal regional wall motion abnormalities. The left ventricular internal cavity size was mildly dilated. There is akinesis of the left ventricular, apical septal wall, inferior wall, anterior wall and lateral wall. There is akinesis of the left ventricular, entire apical segment. There is severe hypokinesis of the left ventricular, basal-mid anterior  wall. A falst tendon is present in the mid LV cavity.  2. Right ventricular systolic function is normal. The right ventricular size is normal. There is moderately elevated pulmonary artery systolic pressure. The estimated right ventricular systolic pressure is 16.6 mmHg.  3. The mitral valve is normal in structure. Mild mitral valve regurgitation. No evidence of mitral stenosis.  4. The aortic valve is tricuspid. There is moderate calcification of the aortic valve. Aortic valve regurgitation is not visualized. Aortic valve sclerosis/calcification is present, without any evidence of aortic stenosis.  5. The inferior vena cava is dilated in size with >50% respiratory variability, suggesting right atrial pressure of 8 mmHg. 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 Left ventricular ejection fraction, by estimation, is 30 to 35%. The left ventricle has moderately decreased function. The left ventricle demonstrates regional wall motion abnormalities. Definity contrast agent was given IV to delineate the left ventricular endocardial borders. The left ventricular internal cavity size was mildly dilated. There is no left ventricular hypertrophy. Left ventricular diastolic parameters are indeterminate. Elevated left ventricular end-diastolic pressure. Right Ventricle: The right ventricular size is normal. No increase in right ventricular wall  thickness. Right ventricular systolic function is normal. There is moderately elevated pulmonary artery systolic pressure. The tricuspid regurgitant velocity is 3.06 m/s, and with an assumed right atrial pressure of 8 mmHg, the estimated right ventricular systolic pressure is 06.3 mmHg. Left Atrium: Left atrial size was normal in size. Right Atrium: Right atrial size was normal in size. Pericardium: There is no evidence of pericardial effusion. Mitral Valve: The mitral valve is normal in structure. Mild mitral annular calcification. Mild mitral valve regurgitation. No evidence of mitral valve stenosis. Tricuspid Valve: The tricuspid valve is normal in structure. Tricuspid valve regurgitation is mild . No evidence of tricuspid stenosis. Aortic Valve: The aortic valve is tricuspid. There is moderate calcification of the aortic valve. Aortic valve regurgitation is not visualized. Aortic valve sclerosis/calcification is present, without any evidence of aortic stenosis. Pulmonic Valve: The pulmonic valve was normal in structure. Pulmonic valve regurgitation is not visualized. No evidence of pulmonic stenosis. Aorta: The aortic root is normal in size and structure. Venous: The inferior vena cava is dilated in size with greater than 50% respiratory variability, suggesting right atrial pressure of 8 mmHg. IAS/Shunts: No atrial level shunt detected by color flow Doppler.  LEFT VENTRICLE PLAX 2D LVIDd:         5.90 cm   Diastology LVIDs:         4.80 cm   LV e' medial:    4.35 cm/s LV PW:         0.90 cm   LV E/e' medial:  21.0 LV IVS:        0.90 cm   LV e' lateral:   6.20 cm/s LVOT diam:     2.30 cm   LV E/e' lateral: 14.8 LV SV:         59 LV SV Index:   31 LVOT Area:     4.15 cm  RIGHT VENTRICLE             IVC RV S prime:     16.10 cm/s  IVC diam: 2.20 cm TAPSE (M-mode): 1.8 cm LEFT ATRIUM             Index        RIGHT ATRIUM  Index LA diam:        4.20 cm 2.17 cm/m   RA Area:     11.40 cm LA Vol (A2C):    40.5 ml 20.95 ml/m  RA Volume:   24.30 ml  12.57 ml/m LA Vol (A4C):   80.0 ml 41.38 ml/m LA Biplane Vol: 62.6 ml 32.38 ml/m  AORTIC VALVE LVOT Vmax:   84.80 cm/s LVOT Vmean:  56.100 cm/s LVOT VTI:    0.142 m  AORTA Ao Root diam: 3.20 cm Ao Asc diam:  3.20 cm MITRAL VALVE               TRICUSPID VALVE MV Area (PHT): 5.88 cm    TR Peak grad:   37.5 mmHg MV Decel Time: 129 msec    TR Vmax:        306.00 cm/s MV E velocity: 91.50 cm/s MV A velocity: 96.40 cm/s  SHUNTS MV E/A ratio:  0.95        Systemic VTI:  0.14 m                            Systemic Diam: 2.30 cm Fransico Him MD Electronically signed by Fransico Him MD Signature Date/Time: 06/12/2022/5:48:41 PM    Final    US RENAL  Result Date: 06/12/2022 CLINICAL DATA:  Acute renal failure. Chronic kidney disease, stage IV. Partial left nephrectomy. EXAM: RENAL / URINARY TRACT ULTRASOUND COMPLETE COMPARISON:  MRI of the abdomen without and with contrast 09/12/2010. FINDINGS: Right Kidney: Renal measurements: 11.0 x 5.0 x 5.6 cm = volume: 162 mL. Renal parenchyma is isoechoic to the index organ, the liver. 2 simple cysts are present near the upper pole measuring 2.4 and 2.0 cm respectively. Left Kidney: Renal measurements: 9.5 x 5.5 x 4.7 cm = volume: 129 mL. Renal parenchyma is isoechoic to the index organ, the spleen. A simple cyst is present at the upper pole measuring up 2.2 cm and at the lower pole measuring up 1.8 cm. No stone or solid lesion is present. No obstruction is present. Bladder: Appears normal for degree of bladder distention. Other: None. IMPRESSION: 1. No acute abnormality. 2. Increased echogenicity of the renal parenchyma bilaterally. This is nonspecific, but commonly seen in the setting of medical renal disease. 3. Simple cysts of both kidneys.  Recommend no follow-up. Electronically Signed   By: San Morelle M.D.   On: 06/12/2022 14:32   DG Chest 2 View  Result Date: 06/12/2022 CLINICAL DATA:  Shortness of breath EXAM: CHEST  - 2 VIEW COMPARISON:  CT chest dated 04/29/2022 FINDINGS: Subpleural reticulation/fibrosis in the lungs bilaterally, similar to prior CT chest, favoring chronic interstitial lung disease. No superimposed opacity suspicious for pneumonia. No pleural effusion or pneumothorax. The heart top-normal in size. Visualized osseous structures are within normal limits. IMPRESSION: Stable chronic interstitial lung disease. No evidence of acute cardiopulmonary disease. Electronically Signed   By: Julian Hy M.D.   On: 06/12/2022 04:00    Pending Labs Unresulted Labs (From admission, onward)     Start     Ordered   06/13/22 0500  Heparin level (unfractionated)  Daily at 5am,   R      06/12/22 0606   06/13/22 0500  APTT  Daily at 5am,   R      06/12/22 0606   06/13/22 0500  CBC  Daily at 5am,   R      06/12/22 0606   06/13/22  4920  Basic metabolic panel  Daily at 5am,   R     Comments: As Scheduled for 5 days    06/12/22 1200   06/13/22 0200  Heparin level (unfractionated)  Once-Timed,   TIMED        06/12/22 1730   06/12/22 1328  Hemoglobin A1c  Add-on,   AD       Comments: To assess prior glycemic control    06/12/22 1327   06/12/22 0555  Brain natriuretic peptide  Once,   URGENT        06/12/22 0554   06/12/22 0549  CK  Once,   URGENT        06/12/22 0548            Vitals/Pain Today's Vitals   06/12/22 1625 06/12/22 1800 06/12/22 1824 06/12/22 1913  BP:   (!) 148/100   Pulse:   100   Resp:  20 18   Temp: 98.2 F (36.8 C)  98.2 F (36.8 C)   TempSrc: Oral  Oral   SpO2:   100%   PainSc:    0-No pain    Isolation Precautions Airborne and Contact precautions  Medications Medications  heparin ADULT infusion 100 units/mL (25000 units/238m) (1,150 Units/hr Intravenous Infusion Verify 06/12/22 1746)  hydrALAZINE (APRESOLINE) tablet 10 mg (10 mg Oral Given 06/12/22 1624)  isosorbide mononitrate (IMDUR) 24 hr tablet 15 mg (15 mg Oral Given 06/12/22 1150)  amLODipine (NORVASC)  tablet 10 mg (10 mg Oral Given 06/12/22 1150)  aspirin EC tablet 81 mg (81 mg Oral Given 06/12/22 1150)  sodium chloride flush (NS) 0.9 % injection 3 mL (3 mLs Intravenous Given 06/12/22 1203)  sodium chloride flush (NS) 0.9 % injection 3 mL (has no administration in time range)  0.9 %  sodium chloride infusion (has no administration in time range)  acetaminophen (TYLENOL) tablet 650 mg (has no administration in time range)  ondansetron (ZOFRAN) injection 4 mg (has no administration in time range)  pravastatin (PRAVACHOL) tablet 40 mg (40 mg Oral Given 06/12/22 1930)  citalopram (CELEXA) tablet 40 mg (40 mg Oral Given 06/12/22 1929)  QUEtiapine (SEROQUEL) tablet 300 mg (has no administration in time range)  pantoprazole (PROTONIX) EC tablet 40 mg (40 mg Oral Given 06/12/22 1930)  clonazePAM (KLONOPIN) tablet 0.5 mg (0.5 mg Oral Given 06/12/22 1930)  gabapentin (NEURONTIN) capsule 300 mg (has no administration in time range)  insulin aspart (novoLOG) injection 0-15 Units (3 Units Subcutaneous Given 06/12/22 1629)  furosemide (LASIX) 100 mg in dextrose 5 % 50 mL IVPB (0 mg Intravenous Stopped 06/12/22 1903)  perflutren lipid microspheres (DEFINITY) IV suspension (4 mLs Intravenous Given 06/12/22 1705)  heparin bolus via infusion 2,000 Units (2,000 Units Intravenous Bolus from Bag 06/12/22 1804)    Mobility : Ambulatory        R Recommendations: See Admitting Provider Note

## 2022-06-12 NOTE — H&P (Signed)
History and Physical    JOVON WINTERHALTER UVO:[id number] DOB: 09-11-1951 DOA: 06/12/2022  DOS: the patient was seen and examined on 06/12/2022  PCP: Gerome Sam, MD   Patient coming from: Home  I have personally briefly reviewed patient's old medical records in [place] is a 71 y.o. male with a history of mild to moderate nonobstructive CAD noted on cardiac catheterization in 2011, AAA s/p repair around 2014-2015, hypertension, hyperlipidemia, type 2 diabetes, obstructive sleep apnea, GERD renal cell carcinoma s/p partial left nephrectomy around 2015-2016 now with CKD stage IIIa, abdominal wall mass of left lower quadrant, depression, and restless leg syndrome.  He recently ECHO revealing cardiomegaly and LV thrombus for which he was started on Eliquis. ON August 29th he had cardiac cath at the [facility] which revealed 100% blockage "front of heart," and 80% blockage "back of heart" and was referred to CVTS for consideration of CABG. Since the cath he has had leg pain.  Patient has had episodes of chest pain that he thought were related to GERD and relieved by PPI. He has noted DOE and now increasing SOB. Due to progressive SOB he presented to [facility] for evaluation. He had labs revealing AKI with Cr 3.34 up from baseline 1.6. He left w/o being seen. His SOB continues and he returns to [facility].   ED Course: Afebrile. Patient was neither SOB or hypoxemic on 5 L [place] oxygen. EDP -exam revealed decreased breath sounds. Lab: glucose 106, BUN 56, Cr 5.07, Troponins 232 to 219. CBC with Hgb 10.7 WBC 10.7. CXR with chronic interstial disease. Cardiology consult requested- see notes. Dx of HFrEF made with CAD but with Non-ACS troponin levels, AKI progressive. IN the ED, per Cardiology, patient started on hydralazine TID and imdur 15 mg. Cardiology recommended nephrology consult to assist with diuresis. TRH called to admit for continued management.   Review of Systems:  Review of  Systems  Constitutional:  Negative for chills, fever and weight loss.  HENT: Negative.    Eyes: Negative.   Respiratory:  Positive for shortness of breath.   Cardiovascular:  Positive for chest pain. Negative for palpitations, claudication and leg swelling.  Gastrointestinal:  Positive for heartburn.  Genitourinary:  Negative for dysuria and urgency.  Musculoskeletal:        Left leg pain, proximally  Skin: Negative.   Neurological: Negative.   Endo/Heme/Allergies: Negative.   Psychiatric/Behavioral: Negative.      Past Medical History:  Diagnosis Date   AAA (abdominal aortic aneurysm) (HCC)    Abdominal wall mass of left lower quadrant    Basal cell carcinoma    CAD (coronary artery disease) 2011   moderate   Colon polyp    DDD (degenerative disc disease)    Depression    Diabetes mellitus type II    no meds   GERD (gastroesophageal reflux disease)    Hyperlipidemia    Neuromuscular disorder (HCC)    Osteopenia    PAD (peripheral artery disease) (HCC)    Renal cell carcinoma    Restless leg syndrome    Sleep apnea    used a cpap yr ago-does not snore or use it now.    Past Surgical History:  Procedure Laterality Date   ABDOMINAL AORTIC ANEURYSM REPAIR     HEMORRHOID SURGERY     HERNIA REPAIR  03/06/12   ventral hernia repair   NEPHRECTOMY     left due to renal cell carcenoma   VENTRAL HERNIA REPAIR  03/06/2012   Procedure: HERNIA REPAIR VENTRAL ADULT;  Surgeon: Joyice Faster. Cornett, MD;  Location: [facility];  Service: General;  Laterality: N/A;  excision of stitch granuloma   Soc hx - married 62 years. He has a recently found daughter approx 41, product of a brief affair - who has to sons. He served [employer] 3 years seeing one [place]. He has worked for [employer] and other places. He has had a very tough several years but is holding up well.    reports that he has been smoking cigarettes. He has been smoking an average of 1 pack per day. He has never used  smokeless tobacco. He reports current alcohol use. He reports that he does not use drugs.  Allergies  Allergen Reactions   Dilaudid [Hydromorphone Hcl] Other (See Comments)    hallucinations   Mirapex [Pramipexole Dihydrochloride] Other (See Comments)    Leg pain   Prednisone Other (See Comments)    irritability    Family History  Problem Relation Age of Onset   Diabetes Mother    Aneurysm Mother        in brain   Cancer Father        lung   Depression Father    Cancer Sister        lung, breast   Pancreatitis Brother    Heart disease Sister        MI   Depression Sister     Prior to Admission medications   Medication Sig Start Date End Date Taking? Authorizing Provider  amLODipine (NORVASC) 10 MG tablet Take 10 mg by mouth every evening.    [provider]  aspirin EC (ASPIR-LOW) 81 MG tablet  01/24/22   [provider]  citalopram (CELEXA) 40 MG tablet Take 40 mg by mouth every evening.    [provider]  clonazePAM Bobbye [place]) 0.5 MG tablet  10/03/21   [provider]  gabapentin (NEURONTIN) 300 MG capsule  10/04/19   [provider]  lansoprazole (PREVACID) 15 MG capsule  01/24/22   [provider]  pravastatin (PRAVACHOL) 20 MG tablet  01/24/22   [provider]  QUEtiapine (SEROQUEL) 300 MG tablet  10/03/17   [provider]  tretinoin (RETIN-A) 0.1 % cream  02/11/22   [provider]    Physical Exam: Vitals:   06/12/22 1052 06/12/22 1100 06/12/22 1113 06/12/22 1205  BP:  (!) 164/104    Pulse: 100 94 94   Resp: 17 (!) 35 (!) 22   Temp:    97.8 F (36.6 C)  TempSrc:    Oral  SpO2: 95% 95% 96%     Physical Exam Vitals and nursing note reviewed.  Constitutional:      Appearance: He is well-developed and normal weight.  HENT:     Head: Normocephalic and atraumatic.     Mouth/Throat:     Mouth: Mucous membranes are moist.     Comments: Native dentition but missing several teeth Eyes:      Extraocular Movements: Extraocular movements intact.     Pupils: Pupils are equal, round, and reactive to light.  Neck:     Vascular: JVD present. No hepatojugular reflux.     Comments: 4-5 cm JVD at 30 degrees Cardiovascular:     Rate and Rhythm: Normal rate and regular rhythm.     Pulses: Normal pulses.     Heart sounds: Normal heart sounds.  Pulmonary:     Effort:  Pulmonary effort is normal.     Breath sounds: No wheezing or rhonchi.  Abdominal:     General: Bowel sounds are normal.     Palpations: Abdomen is soft.  Musculoskeletal:        General: Normal range of motion.     Cervical back: Normal range of motion and neck supple.     Left lower leg: Tenderness present.  Skin:    General: Skin is warm and dry.  Neurological:     General: No focal deficit present.     Mental Status: He is alert and oriented to person, place, and time.  Psychiatric:        Mood and Affect: Mood normal.        Behavior: Behavior normal.      Labs on Admission: I have personally reviewed following labs and imaging studies  CBC: Recent Labs  Lab 06/07/22 1733 06/12/22 0326 06/12/22 0624  WBC 9.7 10.7*  --   NEUTROABS 6.0  --   --   HGB 11.9* 10.7* 10.5*  HCT 35.3* 32.3* 31.0*  MCV 87.8 89.7  --   PLT 279 255  --    Basic Metabolic Panel: Recent Labs  Lab 06/07/22 1733 06/12/22 0326 06/12/22 0624  NA 137 139 138  K 3.7 4.1 4.1  CL 101 101  --   CO2 22 22  --   GLUCOSE 139* 106*  --   BUN 40* 56*  --   CREATININE 3.34* 5.07*  --   CALCIUM 9.0 8.7*  --   MG 1.9  --   --    GFR: Estimated Creatinine Clearance: 13.6 mL/min (A) (by C-G formula based on SCr of 5.07 mg/dL (H)). Liver Function Tests: No results for input(s): "AST", "ALT", "ALKPHOS", "BILITOT", "PROT", "ALBUMIN" in the last 168 hours. No results for input(s): "LIPASE", "AMYLASE" in the last 168 hours. No results for input(s): "AMMONIA" in the last 168 hours. Coagulation Profile: Recent Labs  Lab  06/12/22 0326  INR 1.3*   Cardiac Enzymes: Recent Labs  Lab 06/07/22 1733  CKTOTAL 155   BNP (last 3 results) No results for input(s): "PROBNP" in the last 8760 hours. HbA1C: No results for input(s): "HGBA1C" in the last 72 hours. CBG: No results for input(s): "GLUCAP" in the last 168 hours. Lipid Profile: No results for input(s): "CHOL", "HDL", "LDLCALC", "TRIG", "CHOLHDL", "LDLDIRECT" in the last 72 hours. Thyroid Function Tests: No results for input(s): "TSH", "T4TOTAL", "FREET4", "T3FREE", "THYROIDAB" in the last 72 hours. Anemia Panel: No results for input(s): "VITAMINB12", "FOLATE", "FERRITIN", "TIBC", "IRON", "RETICCTPCT" in the last 72 hours. Urine analysis:    Component Value Date/Time   COLORURINE YELLOW 12/23/2009 [place] 12/23/2009 0857   LABSPEC 1.013 12/23/2009 0857   PHURINE 6.0 12/23/2009 0857   GLUCOSEU NEGATIVE 12/23/2009 0857   HGBUR NEGATIVE 12/23/2009 0857   BILIRUBINUR NEGATIVE 12/23/2009 0857   KETONESUR NEGATIVE 12/23/2009 0857   PROTEINUR NEGATIVE 12/23/2009 0857   UROBILINOGEN 0.2 12/23/2009 0857   NITRITE NEGATIVE 12/23/2009 0857   LEUKOCYTESUR  12/23/2009 0857    NEGATIVE MICROSCOPIC NOT DONE ON URINES WITH NEGATIVE PROTEIN, BLOOD, LEUKOCYTES, NITRITE, OR GLUCOSE <1000 mg/dL.    Radiological Exams on Admission: I have personally reviewed images DG Chest 2 View  Result Date: 06/12/2022 CLINICAL DATA:  Shortness of breath EXAM: CHEST - 2 VIEW COMPARISON:  CT chest dated 04/29/2022 FINDINGS: Subpleural reticulation/fibrosis in the lungs bilaterally, similar to prior CT chest, favoring chronic interstitial lung  disease. No superimposed opacity suspicious for pneumonia. No pleural effusion or pneumothorax. The heart top-normal in size. Visualized osseous structures are within normal limits. IMPRESSION: Stable chronic interstitial lung disease. No evidence of acute cardiopulmonary disease. Electronically Signed   By: Julian Hy  M.D.   On: 06/12/2022 04:00    EKG: I have personally reviewed EKG: LAE, LVH, left anterior fasicular block, old anteroseptal injury.  Assessment/Plan Principal Problem:   Acute kidney injury (AKI) with acute tubular necrosis (ATN) (HCC) Active Problems:   Coronary atherosclerosis of native coronary artery   Acute on chronic systolic CHF (congestive heart failure) (HCC)   DM2 (diabetes mellitus, type 2) (HCC)   GERD (gastroesophageal reflux disease)   Hyperlipidemia   Sleep apnea    Assessment and Plan: * Acute kidney injury (AKI) with acute tubular necrosis (ATN) ([place]) Patient receives care at [facility]. Baseline Cr 1.6. Since cardiac cath Cr has risen to 5.07, suggestive of contrast related nephropathy. AKI a barrier to aggressive diuresis, CABG. Patient with solitary kidney after nephrectomy for Renal cell carcinoma  Plan Nephrology consult to help guide diuresis  Hold nephrotoxic drugs.  Acute on chronic systolic CHF (congestive heart failure) ([place]) Patient followed at [facility] - had ECHO 8/9 23 with EF 25-30%, hypokinesis inferior wall and a LV mural thrombus.  Since his cardiac cath he has develop contrast nephropathy and increased SOB. Suspect worsening of HFrEF and fluid overload.  Patient presenting fluid overloaded with heart strain. . Cardiology has seen patient - BNP ordered (pending at this writing).   Plan Awaiting nephrology guidance on diuresis.  Coronary atherosclerosis of native coronary artery Patient had cardiac cath at [facility] 05/31/22 which revealed multi-vessel disease: 100% front of heart (LAD?) and 80% back of heart (Circ?). He was to see CV surgeon for possible CABG. Patient with mild elevation in Troponins likely related to strain from HFrEF. Cardiology has seen patient - started Imdur and hydralazine.  Plan Continue IV heparin  Continue meds as ordered by cardiology  Based on nephrology input may benefit from additional medication: BB, ARB, SGLT2  DM2 (diabetes mellitus,  type 2) ([place]) Diagnosis per history. On no anti-glycemic agents. No A1C available  Plan A1C  Carb modified diet  SS AC/HS    Sleep apnea Patient reports he was unable to tolerate CPAP previously   Hyperlipidemia Continue treatment with statins - increased dose  GERD (gastroesophageal reflux disease) Problem by history. Has had discomfort relieved with PPI.  Plan Continue PPI at higher dose       DVT prophylaxis:  on Heparin infusion Code Status: Full Code Family Communication: Spoke with wife Derell Bruun  Disposition Plan: TBD  Consults called: cardiology - Dr. Radford Pax; nephrology - Dr. Jonnie Finner  Admission status: Inpatient, Telemetry bed   Adella Hare, MD [facility] 06/12/2022, 1:13 PM

## 2022-06-12 NOTE — Progress Notes (Signed)
ANTICOAGULATION CONSULT NOTE - Initial Consult  Pharmacy Consult for heparin infusion Indication: chest pain/ACS  Allergies  Allergen Reactions   Dilaudid [Hydromorphone Hcl] Other (See Comments)    hallucinations   Mirapex [Pramipexole Dihydrochloride] Other (See Comments)    Leg pain   Prednisone Other (See Comments)    irritability    Patient Measurements:   Heparin Dosing Weight: 75.8 kg  Vital Signs: Temp: 97.6 F (36.4 C) (09/10 0334) Temp Source: Oral (09/10 0334) BP: 162/101 (09/10 0545) Pulse Rate: 97 (09/10 0545)  Labs: Recent Labs    06/12/22 0326  HGB 10.7*  HCT 32.3*  PLT 255  LABPROT 15.6*  INR 1.3*  CREATININE 5.07*  TROPONINIHS 232*    Estimated Creatinine Clearance: 13.6 mL/min (A) (by C-G formula based on SCr of 5.07 mg/dL (H)).   Medical History: Past Medical History:  Diagnosis Date   AAA (abdominal aortic aneurysm) (HCC)    Abdominal wall mass of left lower quadrant    Basal cell carcinoma    CAD (coronary artery disease) 2011   moderate   Colon polyp    DDD (degenerative disc disease)    Depression    Diabetes mellitus type II    no meds   GERD (gastroesophageal reflux disease)    Hyperlipidemia    Neuromuscular disorder (HCC)    Osteopenia    PAD (peripheral artery disease) (HCC)    Renal cell carcinoma    Restless leg syndrome    Sleep apnea    used a cpap yr ago-does not snore or use it now.    Medications:  (Not in a hospital admission)    Assessment: 60 YOM presented to the ED with a cc of sob. After work-up, impression from the ED provider is NSTEMI requiring heparin infusion. This patient is on apixaban 5 mg po bid at home with his last dose being consumed on 06/11/2022 at 20:00 per spouse and patient.   Goal of Therapy:  Heparin level 0.3-0.7 units/ml aPTT 66-102 seconds Monitor platelets by anticoagulation protocol: Yes   Plan:  Start heparin infusion at 900 units/hr Check heparin and aPTT in 8 hours and  daily while on heparin Continue aPTT checks and dose by aPTT until such time as the heparin level and aPTT correlate Continue to monitor H&H and platelets  Mia Winthrop BS, PharmD, BCPS Clinical Pharmacist 06/12/2022 6:13 AM  Contact: [phone] after 3 PM  "Be curious, not judgmental..." -Jamal Maes

## 2022-06-12 NOTE — Consult Note (Addendum)
Cardiology Consultation   Patient ID: Allen Cortez MRN: [id number]; DOB: [birth year]  Admit date: 06/12/2022 Date of Consult: 06/12/2022  PCP:  Gerome Sam, MD   [place] Providers Cardiologist:  None   Patient Profile:   Allen Cortez is a 71 y.o. male with a history of mild to moderate nonobstructive CAD noted on cardiac catheterization in 2011, AAA s/p repair around 2014-2015, hypertension, hyperlipidemia, type 2 diabetes, obstructive sleep apnea, GERD renal cell carcinoma s/p partial left nephrectomy around 2015-2016 now with CKD stage IIIa, abdominal wall mass of left lower quadrant, depression, and restless leg syndrome who is being seen 06/12/2022 for the evaluation of chest pain and shortness of breath with elevated troponin at the request of Dr. Randal Buba.  History of Present Illness:   Allen Cortez is a 71 year old male with the above history. He previously had a cardiac catheterization in with Dr. Irish Lack in 2011 which showed mild to moderate nonobstructive disease but it does not look like he has been seen by our group since that time.  He also has not followed with cardiology since then.  However, his PCP recently ordered an echo as part of work-up for AAA and this reportedly showed cardiomegaly and an LV thrombus. He was started on Eliquis. He was subsequently referred for cardiac catheterization at the [place].  He had this done on 05/31/2022 and it reportedly showed a 100% occlusion in the "front of his heart" and an 80% occlusion in the "back of his heart."  No stents were placed.  He was referred to Dr. Kipp Brood for consideration of CABG has not been seen yet.  Following his cath, he has had significant neural lower extremity pain and has barely been able to walk.  Seen in the ED for this on 06/07/2022 but left before being seen.  Of note, labs from that visit are significant for AKI with a creatinine of 3.34.  Baseline is around 1.5-1.6.  Family states they  were told that a lot of contrast dye was used during recent cardiac catheterization. He returned to the ED earlier this morning via EMS for further evaluation of shortness of breath.  Upon arrival to the ED, patient hypertensive and O2 sat 95% on 5 L of nasal cannula. EKG showed normal sinus rhythm, rate 90 bpm, with LVH and poor R wave progression across precordial leads and no acute ST/T changes.  High-sensitivity troponin mildly elevated at 232 >> 219. BNP pending. Chest x-ray showed stable chronic interstitial lung disease with no acute findings. WBC 10.7, Hgb 10.7, Plts 255. Na 139, K 4.1, Glucose 106, BUN 56, Cr 5.07.  Cardiology consulted for further evaluation.  At the time of this evaluation, patient is resting comfortably on 5 L of O2 via nasal cannula.  Patient reports he has had some dyspnea on exertion for the past several months but shortness of breath worsened last night which is why EMS was called.  Upon their arrival, O2 sats reportedly in the 63s.  He denied any orthopnea or PND prior to this event.  His wife does report some mild lower extremity edema recently but no evidence of this on exam.  Patient denied any chest pain; however, wife does state that he has intermittent chest discomfort which patient thinks is due to his reflux.  This occurs randomly and not necessarily with exertion.  Patient was recently started on Protonix and this has helped.  He denies any palpitations.  He has some mild dizziness with standing/ambulating  but no syncope.  He denies any recent fevers or illnesses.  He has had significant bilateral leg pain since his recent cardiac catheterization as described above.  Lower extremity venous dopplers on 06/08/2022 at the [facility] were negative for DVT.  He has good bilateral posterior tibial pulses.  He has a significant history of tobacco abuse.  He smoked 2 packs a day for about 50 years.  However, since his cardiac catheterization he has been trying to quit and is down to 1  cigarette a day.  Has a significant family history of vascular disease.  His mother had a ischemic heart disease and his sister had a heart attack in her 2s to 25s.  Past Medical History:  Diagnosis Date   AAA (abdominal aortic aneurysm) (HCC)    Abdominal wall mass of left lower quadrant    Basal cell carcinoma    CAD (coronary artery disease) 2011   moderate   Colon polyp    DDD (degenerative disc disease)    Depression    Diabetes mellitus type II    no meds   GERD (gastroesophageal reflux disease)    Hyperlipidemia    Neuromuscular disorder (HCC)    Osteopenia    PAD (peripheral artery disease) (HCC)    Renal cell carcinoma    Restless leg syndrome    Sleep apnea    used a cpap yr ago-does not snore or use it now.    Past Surgical History:  Procedure Laterality Date   ABDOMINAL AORTIC ANEURYSM REPAIR     HEMORRHOID SURGERY     HERNIA REPAIR  03/06/12   ventral hernia repair   NEPHRECTOMY     left due to renal cell carcenoma   VENTRAL HERNIA REPAIR  03/06/2012   Procedure: HERNIA REPAIR VENTRAL ADULT;  Surgeon: Joyice Faster. Cornett, MD;  Location: [place];  Service: General;  Laterality: N/A;  excision of stitch granuloma     Home Medications:  Prior to Admission medications   Medication Sig Start Date End Date Taking? Authorizing Provider  amLODipine (NORVASC) 10 MG tablet Take 10 mg by mouth every evening.    [provider]  aspirin EC (ASPIR-LOW) 81 MG tablet  01/24/22   [provider]  citalopram (CELEXA) 40 MG tablet Take 40 mg by mouth every evening.    [provider]  clonazePAM Bobbye Charleston) 0.5 MG tablet  10/03/21   [provider]  gabapentin (NEURONTIN) 300 MG capsule  10/04/19   [provider]  lansoprazole (PREVACID) 15 MG capsule  01/24/22   [provider]  pravastatin (PRAVACHOL) 20 MG tablet  01/24/22   [provider]  QUEtiapine (SEROQUEL) 300 MG tablet  10/03/17   [provider]  tretinoin (RETIN-A) 0.1 % cream  02/11/22   [provider]    Inpatient Medications: Scheduled Meds:  Continuous Infusions:  heparin 900 Units/hr (06/12/22 0706)   PRN Meds:   Allergies:    Allergies  Allergen Reactions   Dilaudid [Hydromorphone Hcl] Other (See Comments)    hallucinations   Mirapex [Pramipexole Dihydrochloride] Other (See Comments)    Leg pain   Prednisone Other (See Comments)    irritability    Social History:   Social History   Socioeconomic History   Marital status: Married    Spouse name: Not on file   Number of children: Not on file   Years of education: Not on file   Highest education level: Not on file  Occupational History   Not on file  Tobacco Use   Smoking status: Every Day    Packs/day: 1.00    Types: Cigarettes   Smokeless tobacco: Never  Substance and Sexual Activity   Alcohol use: Yes    Comment: glass of wine at night   Drug use: No   Sexual activity: Not on file  Other Topics Concern   Not on file  Social History Narrative   Not on file   Social Determinants of Health   Financial Resource Strain: Not on file  Food Insecurity: Not on file  Transportation Needs: Not on file  Physical Activity: Not on file  Stress: Not on file  Social Connections: Not on file  Intimate Partner Violence: Not on file    Family History:   Family History  Problem Relation Age of Onset   Diabetes Mother    Aneurysm Mother        in brain   Cancer Father        lung   Depression Father    Cancer Sister        lung, breast   Pancreatitis Brother    Heart disease Sister        MI   Depression Sister      ROS:  Please see the history of present illness.  All other ROS reviewed and negative.     Physical Exam/Data:   Vitals:   06/12/22 0334 06/12/22 0335 06/12/22 0545  BP: (!) 152/96  (!) 162/101  Pulse: 92  97  Resp: 20  (!) 21  Temp: 97.6 F (36.4 C)    TempSrc: Oral    SpO2: 95% 95% 97%   No  intake or output data in the 24 hours ending 06/12/22 0734    06/07/2022    5:21 PM 04/21/2022    9:36 AM 04/09/2014    8:44 PM  Last 3 Weights  Weight (lbs) 167 lb 167 lb 3.2 oz   Weight (kg) 75.751 kg 75.841 kg      Information is confidential and restricted. Go to Review Flowsheets to unlock data.     There is no height or weight on file to calculate BMI.   General: 71 y.o. Caucasian male resting comfortably in no acute distress. HEENT: Normocephalic and atraumatic. Sclera clear.  Neck: Supple. No JVD. Heart: RRR. Distinct S1 and S2. No murmurs, gallops, or rubs. Posterior tibial pulses 2+ and equal bilaterally. Lungs: No increased work of breathing. Crackles noted in bilateral bases. Abdomen: Soft, non-distended, and non-tender to palpation. Bowel sounds present.  Extremities: No lower extremity edema.    Skin: Warm and dry. Neuro: Alert and oriented x3. No focal deficits. Psych: Normal affect. Responds appropriately.   EKG:  The EKG was personally reviewed and demonstrates:  Normal sinus rhythm, rate 90 bpm, with LVH and poor R wave progression across precordial leads and no acute ST/T changes.  Telemetry:  Telemetry was personally reviewed and demonstrates: Sinus rhythm with a rate in the 90s.  PVCs/vented particular couplets noted.  One 3 beat run of NSVT noted.  Relevant CV Studies:  Cardiac Catheterization 12/11/2009: Impression: Mild to moderate CAD. No revascularization needed. Normal LV function. Normal hemodynamics. Large infrarenal abdominal aortic aneurysm.  Laboratory Data:  High Sensitivity Troponin:   Recent Labs  Lab 06/12/22 0326 06/12/22 0530  TROPONINIHS 232* 219*     Chemistry Recent Labs  Lab 06/07/22 1733 06/12/22 0326 06/12/22 0624  NA 137 139 138  K 3.7  4.1 4.1  CL 101 101  --   CO2 22 22  --   GLUCOSE 139* 106*  --   BUN 40* 56*  --   CREATININE 3.34* 5.07*  --   CALCIUM 9.0 8.7*  --   MG 1.9  --   --   GFRNONAA 19* 11*  --    ANIONGAP 14 16*  --     No results for input(s): "PROT", "ALBUMIN", "AST", "ALT", "ALKPHOS", "BILITOT" in the last 168 hours. Lipids No results for input(s): "CHOL", "TRIG", "HDL", "LABVLDL", "LDLCALC", "CHOLHDL" in the last 168 hours.  Hematology Recent Labs  Lab 06/07/22 1733 06/12/22 0326 06/12/22 0624  WBC 9.7 10.7*  --   RBC 4.02* 3.60*  --   HGB 11.9* 10.7* 10.5*  HCT 35.3* 32.3* 31.0*  MCV 87.8 89.7  --   MCH 29.6 29.7  --   MCHC 33.7 33.1  --   RDW 14.7 15.5  --   PLT 279 255  --    Thyroid No results for input(s): "TSH", "FREET4" in the last 168 hours.  BNPNo results for input(s): "BNP", "PROBNP" in the last 168 hours.  DDimer No results for input(s): "DDIMER" in the last 168 hours.   Radiology/Studies:  DG Chest 2 View  Result Date: 06/12/2022 CLINICAL DATA:  Shortness of breath EXAM: CHEST - 2 VIEW COMPARISON:  CT chest dated 04/29/2022 FINDINGS: Subpleural reticulation/fibrosis in the lungs bilaterally, similar to prior CT chest, favoring chronic interstitial lung disease. No superimposed opacity suspicious for pneumonia. No pleural effusion or pneumothorax. The heart top-normal in size. Visualized osseous structures are within normal limits. IMPRESSION: Stable chronic interstitial lung disease. No evidence of acute cardiopulmonary disease. Electronically Signed   By: Julian Hy M.D.   On: 06/12/2022 04:00     Assessment and Plan:   Shortness of Breath Acute Hypoxic Respiratory Failure Suspect CHF Patient reports dyspnea on exertion for the past several months but presented due to acute shortness of breath last night with O2 sats in the 80s.  Chest x-ray shows stable chronic interstitial lung disease with no acute findings. He recently had an echo at the [place] which showed cardiomegaly and an LV thrombus (so suspect EF was down as well). - He has crackles noted in bilateral bases but otherwise does not appear significantly volume overloaded. - BNP pending -  Will check Echo here so we can reassess LV function and LV thrombus. - Will discuss adding Coreg 3.'125mg'$  twice daily with MD. - No ACEi/ARB/ARNI, MRA, or SGLT2 inhibitor given renal failure. - I suspect his shortness of breath is due to pulm use and overload in the setting of acute renal failure and what sounds like CHF.  Likely benefit from diuresis but given his current renal function, I would defer this to Nephrology.  Recommend consulting them.  Of note, patient brought CD of angiography. Will see if we can get it scanned into our system.  Chest Pain  Demand Ischemia CAD History of mild to moderate CAD noted on remote cardiac catheterization in 2011.  He did recently have a diagnostic cath at the [facility] on 05/31/2022 which reportedly showed 100% occlusion in "the front of his heart" and a 80% occlusion in the "back of his heart." No records available but patient did bring CD with coronary angiography images.  He was referred to Dr. Kipp Brood to discuss CABG.  Presents with shortness of breath but does report some intermittent chest discomfort that he attributes to a  GERD and improved with Protonix.  No clear exertional symptoms.  EKG shows poor R wave progression in precordial leads but no acute ST/T changes.  High-sensitivity troponin mildly elevated and flat at 232 >> 219 not consistent with ACS.   - Currently chest pain-free - No longer on aspirin due to need for DOAC for LV thrombus. - Will discussing adding Coreg with MD in setting of acute CHF. - Continue home Crestor 40 mg daily. -Troponin elevation is not consistent with ACS.  More consistent with demand in setting of acute renal failure and underlying CAD.  He has been referred to Dr. Kipp Brood for consideration of CABG but suspect this will not be able to be done for at least a few months to allow for treatment of LV thrombus and improvement of renal function.  LV Thrombus Diagnosed with LV thrombus at the [place] in 05/2022 and was  started on Eliquis. - Will repeat Echo here. - He has already been started on IV Heparin given concern for ACS. This is not ACS but will continue IV Heparin for LV thrombus. We may actually need to switch him to Coumadin.  Hypertension BP elevated. - Continue Amlodipine '10mg'$  daily for now. - Will discuss adding Coreg with MD. - If Echo shows reduced EF as suspected, would stop Amlodipine and transition to Hydralazine/Imdur in favor of GDMT.  Hyperlipidemia - Continue home Crestor '40mg'$  daily.  Type 2 Diabetes Mellitus History of diabetes mellitus listed in chart but not on any medications at home. - Management per primary team.  AAA s/p Repair around 2014 to 2015 Recently found to have a new AAA proximal to previous repair. - Follow[facility] by [facility] as an outpatient.   AKI History of Renal Cell Carcinoma s/p Left Partial Nephrectomy around 2015 to 2016 Creatinine 5.34 on admission, up from 3.34 on 06/07/2022. Per son, baseline around 1.5 to 1.6. Suspect due to contrast-induced nephropathy given recent cardiac catheterization. - Avoid Nephrotoxic agents. - Recommend Nephrology consult.  Leg Pain Patient has had significant bilateral lower extremity pain since recent cath on 05/31/2022. Venous dopplers on 06/08/2022 at the [facility] were reportedly negative for PE. - He has good distal pedal pulses so doubt this is significant PAD. - Management per primary team.  Risk Assessment/Risk Scores:    New York Heart Association (NYHA) Functional Class NYHA Class III-IV   For questions or updates, please contact [facility] Please consult [email or web address] for contact info under    Signed, Darreld Mclean, PA-C  06/12/2022 7:34 AM

## 2022-06-12 NOTE — Assessment & Plan Note (Addendum)
Patient reports he was unable to tolerate CPAP previously

## 2022-06-12 NOTE — Progress Notes (Signed)
ANTICOAGULATION CONSULT NOTE - Follow-up Note  Pharmacy Consult for Heparin Indication: chest pain/ACS & LV thrombus  Allergies  Allergen Reactions   Dilaudid [Hydromorphone Hcl] Other (See Comments)    hallucinations   Mirapex [Pramipexole Dihydrochloride] Other (See Comments)    Leg pain   Prednisone Other (See Comments)    irritability    Patient Measurements:   Heparin Dosing Weight: 75.8kg  Vital Signs: Temp: 98.2 F (36.8 C) (09/10 1625) Temp Source: Oral (09/10 1625) BP: 163/104 (09/10 1515) Pulse Rate: 91 (09/10 1515)  Labs: Recent Labs    06/12/22 0326 06/12/22 0530 06/12/22 0624 06/12/22 1630  HGB 10.7*  --  10.5*  --   HCT 32.3*  --  31.0*  --   PLT 255  --   --   --   APTT  --   --   --  44*  LABPROT 15.6*  --   --   --   INR 1.3*  --   --   --   HEPARINUNFRC  --   --   --  >1.10*  CREATININE 5.07*  --   --   --   TROPONINIHS 232* 219*  --   --     Estimated Creatinine Clearance: 13.6 mL/min (A) (by C-G formula based on SCr of 5.07 mg/dL (H)).   Medical History: Past Medical History:  Diagnosis Date   AAA (abdominal aortic aneurysm) (HCC)    Abdominal wall mass of left lower quadrant    Basal cell carcinoma    CAD (coronary artery disease) 2011   moderate   Colon polyp    DDD (degenerative disc disease)    Depression    Diabetes mellitus type II    no meds   GERD (gastroesophageal reflux disease)    Hyperlipidemia    Neuromuscular disorder (HCC)    Osteopenia    PAD (peripheral artery disease) (HCC)    Renal cell carcinoma    Restless leg syndrome    Sleep apnea    used a cpap yr ago-does not snore or use it now.    Medications:  (Not in a hospital admission)  Scheduled:   amLODipine  10 mg Oral Daily   aspirin EC  81 mg Oral Daily   citalopram  40 mg Oral QPM   clonazePAM  0.5 mg Oral Daily   gabapentin  300 mg Oral QHS   hydrALAZINE  10 mg Oral Q8H   insulin aspart  0-15 Units Subcutaneous TID WC   isosorbide mononitrate   15 mg Oral Daily   pantoprazole  40 mg Oral Daily   pravastatin  40 mg Oral q1800   QUEtiapine  300 mg Oral QHS   sodium chloride flush  3 mL Intravenous Q12H   Infusions:   sodium chloride     furosemide     heparin 900 Units/hr (06/12/22 0706)   PRN: sodium chloride, acetaminophen, ondansetron (ZOFRAN) IV, perflutren lipid microspheres (DEFINITY) IV suspension, sodium chloride flush  Assessment: 24 yom with a 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 Patient is presenting with SOB. Heparin per pharmacy consult placed for chest pain/ACS and LV thrombus .  Patient was on apixaban prior to arrival. Last dose 9/9 at 2000. Utilizing aPTT monitoring due to likely falsely high anti-Xa level secondary to DOAC use.  BL aPTT/HL collected but never ran.  Started on 900  units/hr IV heparin. Resulting aPTT level is 44 which is sub-therapeutic. HL this afternoon is > 1.1 which is likely secondary to apixaban.  No issues with infusion or with bleeding per RN.  Hgb 10.5; plt 255  Goal of Therapy:  Heparin level 0.3-0.7 units/ml aPTT 66-102 seconds Monitor platelets by anticoagulation protocol: Yes   Plan:  Bolus 2000 units IV heparin Increase heparin infusion to 1150 units/hr Check aPTT & anti-Xa level at 0200 and daily while on heparin Continue to monitor via aPTT until levels are correlated Continue to monitor H&H and platelets  Lorelei Pont, PharmD, BCPS 06/12/2022 5:24 PM ED Clinical Pharmacist -  [phone]

## 2022-06-12 NOTE — ED Triage Notes (Signed)
Patient arrived with EMS from home reports SOB with chest tightness , rales/crackles this evening . Denies fever or chills .

## 2022-06-12 NOTE — Assessment & Plan Note (Addendum)
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 Patient has been placed on renal replacement therapy.   Plan to continue with outpatient renal replacement therapy.  Continue with furosemide po.   Acute on chronic respiratory failure due to acute pulmonary edema. Continue oxymetry monitoring and supplemental 02 per [place] Oxymetry today on room air is 96%  Anemia of chronic renal disease, continue with EPO and Iron supplementation. (may need to hold further doses, patient pending for cardiac MRI).

## 2022-06-12 NOTE — ED Provider Notes (Addendum)
[facility] EMERGENCY DEPARTMENT Provider Note   CSN: [id number] Arrival date & time: 06/12/22  6295     History  Chief Complaint  Patient presents with   Shortness of Breath    Emphysema / CAD    Allen Cortez is a 71 y.o. male.  The history is provided by the patient.  Shortness of Breath Severity:  Severe Onset quality:  Gradual Duration:  1 day Timing:  Constant Progression:  Worsening Chronicity:  New (has had DOE x 4 weeks) Context: not weather changes   Relieved by:  Nothing Worsened by:  Nothing Ineffective treatments:  None tried Associated symptoms: no abdominal pain, no fever and no wheezing   Risk factors: no family hx of DVT and no obesity   Patient with hypertension and cholesterol and a h/o AAA who presents with 1 month of DOE.  Markedly worse SOB at rest x 24 hours.  Had a creatinine of 1.6 at the [place] at the beginning of August.  He had a diagnostic only catheterization at the [place] on 8/29 that showed a "blockage in the front of the heart and narrowing in the back".   Came to the ED for BLE pain on 9/5 but did not stay to be seen.  Is reportedly urinating a normal volume and numbner of times per day.  He does not drink much urine      Home Medications Prior to Admission medications   Medication Sig Start Date End Date Taking? Authorizing Provider  amLODipine (NORVASC) 10 MG tablet Take 10 mg by mouth every evening.    [provider]  aspirin EC (ASPIR-LOW) 81 MG tablet  01/24/22   [provider]  citalopram (CELEXA) 40 MG tablet Take 40 mg by mouth every evening.    [provider]  clonazePAM Bobbye Charleston) 0.5 MG tablet  10/03/21   [provider]  gabapentin (NEURONTIN) 300 MG capsule  10/04/19   [provider]  lansoprazole (PREVACID) 15 MG capsule  01/24/22   [provider]  pravastatin (PRAVACHOL) 20 MG tablet  01/24/22   [provider]  QUEtiapine (SEROQUEL) 300 MG tablet   10/03/17   [provider]  tretinoin (RETIN-A) 0.1 % cream  02/11/22   [provider]      Allergies    Dilaudid [hydromorphone hcl], Mirapex [pramipexole dihydrochloride], and Prednisone    Review of Systems   Review of Systems  Constitutional:  Negative for fever.  HENT:  Negative for facial swelling.   Eyes:  Negative for redness.  Respiratory:  Positive for shortness of breath. Negative for wheezing and stridor.   Cardiovascular:  Negative for palpitations.  Gastrointestinal:  Negative for abdominal pain.  Musculoskeletal:  Positive for arthralgias.  Neurological:  Negative for facial asymmetry.    Physical Exam Updated Vital Signs BP (!) 152/96   Pulse 92   Temp 97.6 F (36.4 C) (Oral)   Resp 20   SpO2 95%  Physical Exam Constitutional:      General: He is not in acute distress.    Appearance: He is well-developed. He is not diaphoretic.  HENT:     Head: Normocephalic and atraumatic.     Nose: Nose normal.  Eyes:     Conjunctiva/sclera: Conjunctivae normal.     Pupils: Pupils are equal, round, and reactive to light.  Cardiovascular:     Rate and Rhythm: Normal rate and regular rhythm.     Pulses: Normal pulses.  Heart sounds: Normal heart sounds.  Pulmonary:     Effort: Pulmonary effort is normal.     Breath sounds: Decreased breath sounds present. No wheezing or rales.  Abdominal:     General: Bowel sounds are normal.     Palpations: Abdomen is soft.     Tenderness: There is no abdominal tenderness. There is no guarding or rebound.  Musculoskeletal:        General: Normal range of motion.     Cervical back: Normal range of motion and neck supple.     Comments: Pedal edema B  Skin:    General: Skin is warm and dry.     Capillary Refill: Capillary refill takes less than 2 seconds.  Neurological:     General: No focal deficit present.     Mental Status: He is alert and oriented to person, place, and time.  Psychiatric:        Mood and  Affect: Mood normal.        Behavior: Behavior normal.     ED Results / Procedures / Treatments   Labs (all labs ordered are listed, but only abnormal results are displayed) Results for orders placed or performed during the hospital encounter of 39/03/00  Basic metabolic panel  Result Value Ref Range   Sodium 139 135 - 145 mmol/L   Potassium 4.1 3.5 - 5.1 mmol/L   Chloride 101 98 - 111 mmol/L   CO2 22 22 - 32 mmol/L   Glucose, Bld 106 (H) 70 - 99 mg/dL   BUN 56 (H) 8 - 23 mg/dL   Creatinine, Ser 5.07 (H) 0.61 - 1.24 mg/dL   Calcium 8.7 (L) 8.9 - 10.3 mg/dL   GFR, Estimated 11 (L) >60 mL/min   Anion gap 16 (H) 5 - 15  CBC  Result Value Ref Range   WBC 10.7 (H) 4.0 - 10.5 K/uL   RBC 3.60 (L) 4.22 - 5.81 MIL/uL   Hemoglobin 10.7 (L) 13.0 - 17.0 g/dL   HCT 32.3 (L) 39.0 - 52.0 %   MCV 89.7 80.0 - 100.0 fL   MCH 29.7 26.0 - 34.0 pg   MCHC 33.1 30.0 - 36.0 g/dL   RDW 15.5 11.5 - 15.5 %   Platelets 255 150 - 400 K/uL   nRBC 0.0 0.0 - 0.2 %  Protime-INR (order if Patient is taking Coumadin / Warfarin)  Result Value Ref Range   Prothrombin Time 15.6 (H) 11.4 - 15.2 seconds   INR 1.3 (H) 0.8 - 1.2  Troponin I (High Sensitivity)  Result Value Ref Range   Troponin I (High Sensitivity) 232 (HH) <18 ng/L   DG Chest 2 View  Result Date: 06/12/2022 CLINICAL DATA:  Shortness of breath EXAM: CHEST - 2 VIEW COMPARISON:  CT chest dated 04/29/2022 FINDINGS: Subpleural reticulation/fibrosis in the lungs bilaterally, similar to prior CT chest, favoring chronic interstitial lung disease. No superimposed opacity suspicious for pneumonia. No pleural effusion or pneumothorax. The heart top-normal in size. Visualized osseous structures are within normal limits. IMPRESSION: Stable chronic interstitial lung disease. No evidence of acute cardiopulmonary disease. Electronically Signed   By: Julian Hy M.D.   On: 06/12/2022 04:00      EKG EKG Interpretation  Date/Time:  Sunday June 12 2022 03:34:23 EDT Ventricular Rate:  90 PR Interval:  146 QRS Duration: 94 QT Interval:  406 QTC Calculation: 496 R Axis:   -54 Text Interpretation: Normal sinus rhythm Possible Left atrial enlargement Left anterior fascicular block Left  ventricular hypertrophy ( R in aVL , Cornell product ) Anteroseptal infarct , age undetermined PREVIOUS ECG IS PRESENT Confirmed by Dory Horn) on 06/12/2022 5:11:51 AM  Radiology DG Chest 2 View  Result Date: 06/12/2022 CLINICAL DATA:  Shortness of breath EXAM: CHEST - 2 VIEW COMPARISON:  CT chest dated 04/29/2022 FINDINGS: Subpleural reticulation/fibrosis in the lungs bilaterally, similar to prior CT chest, favoring chronic interstitial lung disease. No superimposed opacity suspicious for pneumonia. No pleural effusion or pneumothorax. The heart top-normal in size. Visualized osseous structures are within normal limits. IMPRESSION: Stable chronic interstitial lung disease. No evidence of acute cardiopulmonary disease. Electronically Signed   By: Julian Hy M.D.   On: 06/12/2022 04:00    Procedures Procedures    Medications Ordered in ED Medications  heparin ADULT infusion 100 units/mL (25000 units/247m) (has no administration in time range)     ED Course/ Medical Decision Making/ A&P                           Medical Decision Making Patient with one day of SOB.  Had a diagnostic catheterization on 8/29 showing blockages referred to thoracic surgery. Presented for BLE pain on 9/5, seen for MSE but did not stay to be seen   Amount and/or Complexity of Data Reviewed Independent Historian: spouse    Details: See above  External Data Reviewed: notes.    Details: Previous MSE notes and outpatient psychiatry notes  Labs: ordered.    Details: All labs reviewed: elevated troponin 232.  Normal sodium and potassium elevated BUN 56 and creatinine 5.07. white count mildly elevated 10.7, hemoglobin low 10.7 normal platelets.  Mildly elevated  INR 1/3.  BNP and covid ordered by me  Radiology: ordered and independent interpretation performed.    Details: No CHF by me on cxr  Discussion of management or test interpretation with external provider(s): Case d/w Dr. KHumphrey Rolls cardiology to consult Case d/w Dr. SCyd Silencewill admit   Risk Prescription drug management. Drug therapy requiring intensive monitoring for toxicity. Decision regarding hospitalization. Risk Details: The patient appears reasonably stabilized for admission considering the current resources, flow, and capabilities available in the ED at this time, and I doubt any other [facility] further screening and/or treatment in the ED prior to admission.   Critical Care Total time providing critical care: 30 minutes (Consults heparin therapy )    Final Clinical Impression(s) / ED Diagnoses Final diagnoses:  NSTEMI (non-ST elevated myocardial infarction) ([facility]  Acute respiratory failure with hypoxia ([facility]  AKI (acute kidney injury) ([facility]    The patient appears reasonably stabilized for admission considering the current resources, flow, and capabilities available in the ED at this time, and I doubt any other [facility] further screening and/or treatment in the ED prior to admission.    Ayoub Arey, MD 06/12/22 [phone]

## 2022-06-12 NOTE — Subjective & Objective (Addendum)
AZAEL RAGAIN is a 71 y.o. male with a history of mild to moderate nonobstructive CAD noted on cardiac catheterization in 2011, AAA s/p repair around 2014-2015, hypertension, hyperlipidemia, type 2 diabetes, obstructive sleep apnea, GERD renal cell carcinoma s/p partial left nephrectomy around 2015-2016 now with CKD stage IIIa, abdominal wall mass of left lower quadrant, depression, and restless leg syndrome.  He recently ECHO revealing cardiomegaly and LV thrombus for which he was started on Eliquis. ON August 29th he had cardiac cath at the [facility] which revealed 100% blockage "front of heart," and 80% blockage "back of heart" and was referred to CVTS for consideration of CABG. Since the cath he has had leg pain.  Patient has had episodes of chest pain that he thought were related to GERD and relieved by PPI. He has noted DOE and now increasing SOB. Due to progressive SOB he presented to [facility] for evaluation. He had labs revealing AKI with Cr 3.34 up from baseline 1.6. He left w/o being seen. His SOB continues and he returns to [facility].

## 2022-06-12 NOTE — Assessment & Plan Note (Deleted)
Patient presenting fluid overloaded with heart strain. Had Echo at [facility] results not available but note indicates cardiomyopathy and LV thrombus for which eliquis was started. Cardiology has seen patient - BNP ordered (pending at this writing).   Plan Awaiting nephrology guidance on diuresis.

## 2022-06-12 NOTE — Consult Note (Addendum)
Renal Service Consult Note Allen Cortez  Allen Cortez 06/12/2022 Sol Blazing, MD Requesting Physician: Dr. Linda Hedges  Reason for Consult: Renal failure HPI: The patient is a 71 y.o. year-old w/ hx of AAA sp repair (2014), HTN, HL, OSA, DM2, RCC sp partial L nephrectomy (2015), depression who presented to ED w/ c/o chest pain and SOB. In ED pt was afeb, creat 5.9, trop 232, Hb 10.7, CXR showed IS changes. Cardiology consulted  w/ dx of HFrEF. They started po hydralazine and imdur and requested nephrology input for help w/ renal failure/ diuresis.   Per cardiology notes pt recently was dx'd w/ recurrent AAA and preop clearance echo showed LV thrombus and pt was started on eliquis and referred for heart cath due to new wall motion changes. Heart cath was done at the [facility] on 05/24/22. He had some CAD and was referred to heart surgeon but hasn't seen them yet. Since the heart cath he has been having SOB and some LE pain so bad he can hardly walk. Seen in ED on 9/5 w/ creat 3.3 (b/l 1.5- 1.6).    Per pt's son he showed me that in [place] from the [place] on 05/11/22 (pre heart cath) was 1.6, and on 8/24, 2 days after cath, the creat was up to 2.2.   Pt is not on home O2. Getting 5 L [place] here, SOB better.   ROS - denies CP, no joint pain, no HA, no blurry vision, no rash, no diarrhea   Past Medical History  Past Medical History:  Diagnosis Date   AAA (abdominal aortic aneurysm) (HCC)    Abdominal wall mass of left lower quadrant    Basal cell carcinoma    CAD (coronary artery disease) 2011   moderate   Colon polyp    DDD (degenerative disc disease)    Depression    Diabetes mellitus type II    no meds   GERD (gastroesophageal reflux disease)    Hyperlipidemia    Neuromuscular disorder (HCC)    Osteopenia    PAD (peripheral artery disease) (HCC)    Renal cell carcinoma    Restless leg syndrome    Sleep apnea    used a cpap yr ago-does not snore or use it now.    Past Surgical History  Past Surgical History:  Procedure Laterality Date   ABDOMINAL AORTIC ANEURYSM REPAIR     HEMORRHOID SURGERY     HERNIA REPAIR  03/06/12   ventral hernia repair   NEPHRECTOMY     left due to renal cell carcenoma   VENTRAL HERNIA REPAIR  03/06/2012   Procedure: HERNIA REPAIR VENTRAL ADULT;  Surgeon: Joyice Faster. Cornett, MD;  Location: [place];  Service: General;  Laterality: N/A;  excision of stitch granuloma   Family History  Family History  Problem Relation Age of Onset   Diabetes Mother    Aneurysm Mother        in brain   Cancer Father        lung   Depression Father    Cancer Sister        lung, breast   Pancreatitis Brother    Heart disease Sister        MI   Depression Sister    Social History  reports that he has been smoking cigarettes. He has been smoking an average of 1 pack per day. He has never used smokeless tobacco. He reports current alcohol use. He reports  that he does not use drugs. Allergies  Allergies  Allergen Reactions   Dilaudid [Hydromorphone Hcl] Other (See Comments)    hallucinations   Mirapex [Pramipexole Dihydrochloride] Other (See Comments)    Leg pain   Prednisone Other (See Comments)    irritability   Home medications Prior to Admission medications   Medication Sig Start Date End Date Taking? Authorizing Provider  amLODipine (NORVASC) 10 MG tablet Take 10 mg by mouth every evening.    [provider]  aspirin EC (ASPIR-LOW) 81 MG tablet  01/24/22   [provider]  citalopram (CELEXA) 40 MG tablet Take 40 mg by mouth every evening.    [provider]  clonazePAM Bobbye Charleston) 0.5 MG tablet  10/03/21   [provider]  gabapentin (NEURONTIN) 300 MG capsule  10/04/19   [provider]  lansoprazole (PREVACID) 15 MG capsule  01/24/22   [provider]  pravastatin (PRAVACHOL) 20 MG tablet  01/24/22   [provider]  QUEtiapine (SEROQUEL) 300 MG tablet   10/03/17   [provider]  tretinoin (RETIN-A) 0.1 % cream  02/11/22   [provider]     Vitals:   06/12/22 1052 06/12/22 1100 06/12/22 1113 06/12/22 1205  BP:  (!) 164/104    Pulse: 100 94 94   Resp: 17 (!) 35 (!) 22   Temp:    97.8 F (36.6 C)  TempSrc:    Oral  SpO2: 95% 95% 96%    Exam Gen alert, no distress, 5 L [place] O2 No rash, cyanosis or gangrene Sclera anicteric, throat clear  No jvd or bruits Chest bilat basilar rales 1/3 up RRR no MRG Abd soft ntnd no mass or ascites +bs GU normal male MS no joint effusions or deformity Ext 1+ bilat pedal edema, no pretib edema Neuro is alert, Ox 3 , nf   Home meds include - aspirin, citalopram, clonazepam, gabapentin, lansoprazole, pravastatin, quetiapine, prns/ vits/ supps     Date   Creat  eGFR   2011- 2012  1.12- 1.19 AKI 1.54 > 1.44   2013   1.19   2015   1.15   05/11/22  1.62  eGFR 42 ml/min, 3b, [facility]    05/24/22    [facility] heart [facility]   05/26/22  2.21   [facility] labs   06/07/22  3.34  19 ml/min   06/12/22  5.07  11 ml/min    BP's here high 160/ 100 range, HR 94, RR 18-25, temp 97.8, [place] O2 5 L   Na 139 K 4.1 CO2 22 BUN 56  Cr 5.07 (last creat 3.3 on 06/07/22 last week)  Ca 8.7   Hb 10  WBC  10K   Plt 255    Trop 232, 219    CXR 9/10 - IMPRESSION: + interstitial lung disease. No evidence of acute cardiopulmonary disease.      CR chest 05/31/22 - showed emphysema and some interstitial fibrosis, done in OP setting I believe        UA, lytes, renal US, bladder scan - ordered    Assessment/ Plan: AKI on CKD 3b - b/l creatinine 1.6 prior to recent heart cath. Creat here 5.0 now 3 wks after heart cath. The labs from 2 days post cath (creat 2.2) and from 9/5 in ED here (creat 3.3) are consistent w/ a typical post-contrast ATN / AKI pattern. Pt here is SOB but not in distress. CXR w/ mild IS edema, +rales  and mild LE edema on exam. Will plan lasix IV 100 mg bid today. Get UA , urine lytes, renal US and bladder  scan. No signs of uremia, pt is stable clinically. It's possible will need HD in the next few days, also just as likely that he will recover function w/o needing dialysis. Will follow.  Uncont HTN - getting amlodipine, low dose hydralazine and imdur ordered.  Cont for now.  CAD - sp LHC on 8/22 at [facility] as above A/C systolic CHF - echo pending, IV lasix as above has been ordered DM2 OSA Acute hypoxic resp failure - as above      Rob Darryle Dennie  MD 06/12/2022, 1:11 PM Recent Labs  Lab 06/07/22 1733 06/12/22 0326 06/12/22 0624  HGB 11.9* 10.7* 10.5*  CALCIUM 9.0 8.7*  --   CREATININE 3.34* 5.07*  --   K 3.7 4.1 4.1   Inpatient medications:  amLODipine  10 mg Oral Daily   aspirin EC  81 mg Oral Daily   citalopram  40 mg Oral QPM   clonazePAM  0.5 mg Oral Daily   gabapentin  300 mg Oral QHS   hydrALAZINE  10 mg Oral Q8H   insulin aspart  0-15 Units Subcutaneous TID WC   isosorbide mononitrate  15 mg Oral Daily   pantoprazole  40 mg Oral Daily   pravastatin  40 mg Oral q1800   QUEtiapine  300 mg Oral QHS   sodium chloride flush  3 mL Intravenous Q12H    sodium chloride     furosemide     heparin 900 Units/hr (06/12/22 0706)   sodium chloride, acetaminophen, ondansetron (ZOFRAN) IV, sodium chloride flush

## 2022-06-12 NOTE — Assessment & Plan Note (Addendum)
Echocardiogram with reduced LV systolic function with EF 30 to 35%, akinesis of the left ventricular, apical septal wall, inferior wall, anterior wall and lateral wall, akinesis of the left ventricular, entire apical segment. Severe hypokinesis of the left ventricular basal mid anterior wall. RV systolic function preserved. RVSP 45.5   Patient will need to continue with renal replacement therapy for fluid management.  Systolic blood pressure 081 to 140 mmHg. Patient is 3 Kg down since admission.    Continue carvedilol, isosorbide and blood pressure control with amlodipine and hydralazine   LV thrombus, start transition to warfarin, continue with heparin IV bridging.  INR today is 1,1 target 2 to 3.

## 2022-06-12 NOTE — Assessment & Plan Note (Addendum)
Continue glucose cover and monitoring with insulin sliding scale.  Patient with poor appetite Fasting glucose 99 this am.  Continue with nutritional supplements.   Continue with statin therapy.

## 2022-06-12 NOTE — Assessment & Plan Note (Signed)
Continue treatment with statins - increased dose

## 2022-06-13 ENCOUNTER — Inpatient Hospital Stay: Payer: Self-pay

## 2022-06-13 DIAGNOSIS — I255 Ischemic cardiomyopathy: Secondary | ICD-10-CM | POA: Diagnosis not present

## 2022-06-13 DIAGNOSIS — I25118 Atherosclerotic heart disease of native coronary artery with other forms of angina pectoris: Secondary | ICD-10-CM | POA: Diagnosis not present

## 2022-06-13 DIAGNOSIS — N170 Acute kidney failure with tubular necrosis: Secondary | ICD-10-CM | POA: Diagnosis not present

## 2022-06-13 DIAGNOSIS — I513 Intracardiac thrombosis, not elsewhere classified: Secondary | ICD-10-CM | POA: Insufficient documentation

## 2022-06-13 DIAGNOSIS — I5023 Acute on chronic systolic (congestive) heart failure: Secondary | ICD-10-CM | POA: Diagnosis not present

## 2022-06-13 LAB — HEMOGLOBIN A1C
Hgb A1c MFr Bld: 6.1 % — ABNORMAL HIGH (ref 4.8–5.6)
Mean Plasma Glucose: 128.37 mg/dL

## 2022-06-13 LAB — BASIC METABOLIC PANEL
Anion gap: 12 (ref 5–15)
BUN: 65 mg/dL — ABNORMAL HIGH (ref 8–23)
CO2: 22 mmol/L (ref 22–32)
Calcium: 8.6 mg/dL — ABNORMAL LOW (ref 8.9–10.3)
Chloride: 105 mmol/L (ref 98–111)
Creatinine, Ser: 5.6 mg/dL — ABNORMAL HIGH (ref 0.61–1.24)
GFR, Estimated: 10 mL/min — ABNORMAL LOW (ref >60)
Glucose, Bld: 104 mg/dL — ABNORMAL HIGH (ref 70–99)
Potassium: 3.8 mmol/L (ref 3.5–5.1)
Sodium: 139 mmol/L (ref 135–145)

## 2022-06-13 LAB — APTT
aPTT: 57 seconds — ABNORMAL HIGH (ref 24–36)
aPTT: 74 seconds — ABNORMAL HIGH (ref 24–36)
aPTT: 71 seconds — ABNORMAL HIGH (ref 24–36)

## 2022-06-13 LAB — CBC
HCT: 30.3 % — ABNORMAL LOW (ref 39.0–52.0)
Hemoglobin: 10.1 g/dL — ABNORMAL LOW (ref 13.0–17.0)
MCH: 29.5 pg (ref 26.0–34.0)
MCHC: 33.3 g/dL (ref 30.0–36.0)
MCV: 88.6 fL (ref 80.0–100.0)
Platelets: 249 10*3/uL (ref 150–400)
RBC: 3.42 MIL/uL — ABNORMAL LOW (ref 4.22–5.81)
RDW: 15.5 % (ref 11.5–15.5)
WBC: 9.6 10*3/uL (ref 4.0–10.5)
nRBC: 0 % (ref 0.0–0.2)

## 2022-06-13 LAB — GLUCOSE, CAPILLARY
Glucose-Capillary: 114 mg/dL — ABNORMAL HIGH (ref 70–99)
Glucose-Capillary: 114 mg/dL — ABNORMAL HIGH (ref 70–99)

## 2022-06-13 LAB — CBG MONITORING, ED
Glucose-Capillary: 109 mg/dL — ABNORMAL HIGH (ref 70–99)
Glucose-Capillary: 101 mg/dL — ABNORMAL HIGH (ref 70–99)

## 2022-06-13 LAB — HEPARIN LEVEL (UNFRACTIONATED)
Heparin Unfractionated: >1.1 IU/mL — ABNORMAL HIGH (ref 0.30–0.70)
Heparin Unfractionated: >1.1 IU/mL — ABNORMAL HIGH (ref 0.30–0.70)

## 2022-06-13 LAB — URIC ACID: Uric Acid, Serum: 9.6 mg/dL — ABNORMAL HIGH (ref 3.7–8.6)

## 2022-06-13 MED ORDER — CARVEDILOL 3.125 MG PO TABS
3.1250 mg | ORAL_TABLET | Freq: Two times a day (BID) | ORAL | Status: DC
  Administered 2022-06-13 – 2022-06-14 (×3): 3.125 mg via ORAL
  Filled 2022-06-13 (×3): qty 1

## 2022-06-13 MED ORDER — FUROSEMIDE 10 MG/ML IJ SOLN
80.0000 mg | Freq: Once | INTRAMUSCULAR | Status: AC
Start: 2022-06-13 — End: 2022-06-13
  Administered 2022-06-13: 80 mg via INTRAVENOUS
  Filled 2022-06-13: qty 8

## 2022-06-13 MED ORDER — ALLOPURINOL 100 MG PO TABS
100.0000 mg | ORAL_TABLET | Freq: Every day | ORAL | Status: DC
  Administered 2022-06-13 – 2022-06-15 (×3): 100 mg via ORAL
  Filled 2022-06-13 (×3): qty 1

## 2022-06-13 NOTE — Progress Notes (Addendum)
[facility] Progress Note   Subjective:   Seen in ED room, family bedside and son on face time.  He is feeling ok.  Denies dysgeusia.  Having some myoclonic jerking and tremor.  Hallucinated overnight someone was squeezing his arm.  UOP yesterday 1.38L.  Dyspnea improving.    Objective Vitals:   06/13/22 0645 06/13/22 0800 06/13/22 1000 06/13/22 1009  BP:  (!) 143/86 (!) 157/97 (!) 157/97  Pulse:  (!) 105 100   Resp:  (!) 26 (!) 25   Temp: 98.4 F (36.9 C)     TempSrc: Oral     SpO2:  91% 100%    Physical Exam General: chronically ill man on stretcher who looks comfortable Heart: Reg S1S2, no rub Lungs: clear Abdomen: soft Extremities: no edema Neuro:  tremor while holding cup, no frank myoclonic jerks, conversant and oriented  Additional Objective Labs: Basic Metabolic Panel: Recent Labs  Lab 06/07/22 1733 06/12/22 0326 06/12/22 0624 06/13/22 0200  NA 137 139 138 139  K 3.7 4.1 4.1 3.8  CL 101 101  --  105  CO2 22 22  --  22  GLUCOSE 139* 106*  --  104*  BUN 40* 56*  --  65*  CREATININE 3.34* 5.07*  --  5.60*  CALCIUM 9.0 8.7*  --  8.6*   Liver Function Tests: No results for input(s): "AST", "ALT", "ALKPHOS", "BILITOT", "PROT", "ALBUMIN" in the last 168 hours. No results for input(s): "LIPASE", "AMYLASE" in the last 168 hours. CBC: Recent Labs  Lab 06/07/22 1733 06/12/22 0326 06/12/22 0624 06/13/22 0200  WBC 9.7 10.7*  --  9.6  NEUTROABS 6.0  --   --   --   HGB 11.9* 10.7* 10.5* 10.1*  HCT 35.3* 32.3* 31.0* 30.3*  MCV 87.8 89.7  --  88.6  PLT 279 255  --  249   Blood Culture No results found for: "SDES", "SPECREQUEST", "CULT", "REPTSTATUS"  Cardiac Enzymes: Recent Labs  Lab 06/07/22 1733  CKTOTAL 155   CBG: Recent Labs  Lab 06/12/22 1623 06/12/22 2147 06/13/22 0755  GLUCAP 194* 120* 109*   Iron Studies: No results for input(s): "IRON", "TIBC", "TRANSFERRIN", "FERRITIN" in the last 72  hours. '@lablastinr3'$ @ Studies/Results: ECHOCARDIOGRAM COMPLETE  Result Date: 06/12/2022    ECHOCARDIOGRAM REPORT   Patient Name:   Allen Cortez Date of Exam: 06/12/2022 Medical Rec #:  [id number]        Height:       70.0 in Accession #:    [id number]       Weight:       167.0 lb Date of Birth:  Nov 14, 1950       BSA:          1.933 m Patient Age:    71 years         BP:           163/104 mmHg Patient Gender: M                HR:           96 bpm. Exam Location:  Inpatient Procedure: 2D Echo Indications:    dyspnea. Left ventricular clot.  History:        Patient has no prior history of Echocardiogram examinations.                 Risk Factors:Dyslipidemia, Hypertension, Diabetes, Current                 Smoker  and Sleep Apnea.  Sonographer:    Johny Chess RDCS Referring Phys: [id number] [place]  1. 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 Left ventricular ejection fraction, by estimation, is 30-35%. The left ventricle is abnormal. The left ventricle has focal regional wall motion abnormalities. The left ventricular internal cavity size was mildly dilated. There is akinesis of the left ventricular, apical septal wall, inferior wall, anterior wall and lateral wall. There is akinesis of the left ventricular, entire apical segment. There is severe hypokinesis of the left ventricular, basal-mid anterior wall. A falst tendon is present in the mid LV cavity.  2. Right ventricular systolic function is normal. The right ventricular size is normal. There is moderately elevated pulmonary artery systolic pressure. The estimated right ventricular systolic pressure is 16.0 mmHg.  3. The mitral valve is normal in structure. Mild mitral valve regurgitation. No evidence of mitral stenosis.  4. The aortic valve is tricuspid. There is moderate calcification of the aortic valve. Aortic valve regurgitation  is not visualized. Aortic valve sclerosis/calcification is present, without any evidence of aortic stenosis.  5. The inferior vena cava is dilated in size with >50% respiratory variability, suggesting right atrial pressure of 8 mmHg. 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 Left ventricular ejection fraction, by estimation, is 30 to 35%. The left ventricle has moderately decreased function. The left ventricle demonstrates regional wall motion abnormalities. Definity contrast agent was given IV to delineate the left ventricular endocardial borders. The left ventricular internal cavity size was mildly dilated. There is no left ventricular hypertrophy. Left ventricular diastolic parameters are indeterminate. Elevated left ventricular end-diastolic pressure. Right Ventricle: The right ventricular size is normal. No increase in right ventricular wall thickness. Right ventricular systolic function is normal. There is moderately elevated pulmonary artery systolic pressure. The tricuspid regurgitant velocity is 3.06 m/s, and with an assumed right atrial pressure of 8 mmHg, the estimated right ventricular systolic pressure is 10.9 mmHg. Left Atrium: Left atrial size was normal in size. Right Atrium: Right atrial size was normal in size. Pericardium: There is no evidence of pericardial effusion. Mitral Valve: The mitral valve is normal in structure. Mild mitral annular calcification. Mild mitral valve regurgitation. No evidence of mitral valve stenosis. Tricuspid Valve: The tricuspid valve is normal in structure. Tricuspid valve regurgitation is mild . No evidence of tricuspid stenosis. Aortic Valve: The aortic valve is tricuspid. There is moderate calcification of the aortic valve. Aortic valve regurgitation is not visualized. Aortic valve sclerosis/calcification is present, without any evidence of  aortic stenosis. Pulmonic Valve: The pulmonic valve was normal in structure. Pulmonic valve regurgitation is not visualized. No evidence of pulmonic stenosis. Aorta: The aortic root is normal in size and structure. Venous: The inferior vena cava is dilated in size with greater than 50% respiratory variability, suggesting right atrial pressure of 8 mmHg. IAS/Shunts: No atrial level shunt detected by color flow Doppler.  LEFT VENTRICLE PLAX 2D LVIDd:         5.90 cm   Diastology LVIDs:         4.80 cm   LV e' medial:    4.35 cm/s LV PW:         0.90 cm   LV E/e' medial:  21.0 LV IVS:  0.90 cm   LV e' lateral:   6.20 cm/s LVOT diam:     2.30 cm   LV E/e' lateral: 14.8 LV SV:         59 LV SV Index:   31 LVOT Area:     4.15 cm  RIGHT VENTRICLE             IVC RV S prime:     16.10 cm/s  IVC diam: 2.20 cm TAPSE (M-mode): 1.8 cm LEFT ATRIUM             Index        RIGHT ATRIUM           Index LA diam:        4.20 cm 2.17 cm/m   RA Area:     11.40 cm LA Vol (A2C):   40.5 ml 20.95 ml/m  RA Volume:   24.30 ml  12.57 ml/m LA Vol (A4C):   80.0 ml 41.38 ml/m LA Biplane Vol: 62.6 ml 32.38 ml/m  AORTIC VALVE LVOT Vmax:   84.80 cm/s LVOT Vmean:  56.100 cm/s LVOT VTI:    0.142 m  AORTA Ao Root diam: 3.20 cm Ao Asc diam:  3.20 cm MITRAL VALVE               TRICUSPID VALVE MV Area (PHT): 5.88 cm    TR Peak grad:   37.5 mmHg MV Decel Time: 129 msec    TR Vmax:        306.00 cm/s MV E velocity: 91.50 cm/s MV A velocity: 96.40 cm/s  SHUNTS MV E/A ratio:  0.95        Systemic VTI:  0.14 m                            Systemic Diam: 2.30 cm Fransico Him MD Electronically signed by Fransico Him MD Signature Date/Time: 06/12/2022/5:48:41 PM    Final    US RENAL  Result Date: 06/12/2022 CLINICAL DATA:  Acute renal failure. Chronic kidney disease, stage IV. Partial left nephrectomy. EXAM: RENAL / URINARY TRACT ULTRASOUND COMPLETE COMPARISON:  MRI of the abdomen without and with contrast 09/12/2010. FINDINGS: Right Kidney:  Renal measurements: 11.0 x 5.0 x 5.6 cm = volume: 162 mL. Renal parenchyma is isoechoic to the index organ, the liver. 2 simple cysts are present near the upper pole measuring 2.4 and 2.0 cm respectively. Left Kidney: Renal measurements: 9.5 x 5.5 x 4.7 cm = volume: 129 mL. Renal parenchyma is isoechoic to the index organ, the spleen. A simple cyst is present at the upper pole measuring up 2.2 cm and at the lower pole measuring up 1.8 cm. No stone or solid lesion is present. No obstruction is present. Bladder: Appears normal for degree of bladder distention. Other: None. IMPRESSION: 1. No acute abnormality. 2. Increased echogenicity of the renal parenchyma bilaterally. This is nonspecific, but commonly seen in the setting of medical renal disease. 3. Simple cysts of both kidneys.  Recommend no follow-up. Electronically Signed   By: San Morelle M.D.   On: 06/12/2022 14:32   DG Chest 2 View  Result Date: 06/12/2022 CLINICAL DATA:  Shortness of breath EXAM: CHEST - 2 VIEW COMPARISON:  CT chest dated 04/29/2022 FINDINGS: Subpleural reticulation/fibrosis in the lungs bilaterally, similar to prior CT chest, favoring chronic interstitial lung disease. No superimposed opacity suspicious for pneumonia. No pleural effusion or pneumothorax. The heart top-normal in size. Visualized  osseous structures are within normal limits. IMPRESSION: Stable chronic interstitial lung disease. No evidence of acute cardiopulmonary disease. Electronically Signed   By: Julian Hy M.D.   On: 06/12/2022 04:00   Medications:  sodium chloride     heparin 1,350 Units/hr (06/13/22 0342)    amLODipine  10 mg Oral Daily   aspirin EC  81 mg Oral Daily   citalopram  40 mg Oral QPM   clonazePAM  0.5 mg Oral Daily   gabapentin  300 mg Oral QHS   hydrALAZINE  10 mg Oral Q8H   insulin aspart  0-15 Units Subcutaneous TID WC   isosorbide mononitrate  15 mg Oral Daily   pantoprazole  40 mg Oral Daily   pravastatin  40 mg Oral  q1800   QUEtiapine  300 mg Oral QHS   sodium chloride flush  3 mL Intravenous Q12H   Assessment/ Plan: AKI on CKD 3b - b/l creatinine 1.6 prior to recent heart cath. Creat here 5.0 now 3 wks after heart cath. The labs from 2 days post cath (creat 2.2) and from 9/5 in ED here (creat 3.3) are consistent w/ a typical post-contrast ATN / AKI pattern. Pt here is SOB but improving with diuresis - will give 1 additional dose lasix 80 IV now. Get UA +small blood (none on microscopy) and 30 protein; FeNa c/w ATN.  Do not suspect GN. Renal US c/w CKD, simple cysts. Having some myoclonic jerking but no clear uremia, pt is stable clinically. It's possible will need HD in the next few days, also just as likely that he will recover function w/o needing dialysis. Will follow.  Uncont HTN - getting amlodipine, low dose hydralazine and imdur ordered.  Cont for now.  CAD - sp LHC on 8/22 at [facility] as above; Cardiology following - think he'll need CABG ultimately.  Pre op testing hindered by AKI for now.   A/C systolic CHF - echo pending, IV lasix as above has been ordered.   DM2 OSA Acute hypoxic resp failure - as above, improving Tremor/myoclonic jerks - probably multifactorial with AKI + gabapentin 300 qhs.  D/c gabapentin - says neuropathy not bad and to just hold med instead of reduce.  Jannifer Hick MD 06/13/2022, 11:26 AM  [facility] Pager: [phone]

## 2022-06-13 NOTE — ED Notes (Signed)
Cardiology at bedside.

## 2022-06-13 NOTE — Progress Notes (Signed)
Rounding Note    Patient Name: Allen Cortez Date of Encounter: 06/13/2022  [facility] Cardiologist: None Turner (new to [facility]. Pt followed at [facility])  Subjective   Breathing improved. No pain.   Inpatient Medications    Scheduled Meds:  amLODipine  10 mg Oral Daily   aspirin EC  81 mg Oral Daily   citalopram  40 mg Oral QPM   clonazePAM  0.5 mg Oral Daily   gabapentin  300 mg Oral QHS   hydrALAZINE  10 mg Oral Q8H   insulin aspart  0-15 Units Subcutaneous TID WC   isosorbide mononitrate  15 mg Oral Daily   pantoprazole  40 mg Oral Daily   pravastatin  40 mg Oral q1800   QUEtiapine  300 mg Oral QHS   sodium chloride flush  3 mL Intravenous Q12H   Continuous Infusions:  sodium chloride     heparin 1,350 Units/hr (06/13/22 0342)   PRN Meds: sodium chloride, acetaminophen, ondansetron (ZOFRAN) IV, sodium chloride flush   Vital Signs    Vitals:   06/13/22 0645 06/13/22 0800 06/13/22 1000 06/13/22 1009  BP:  (!) 143/86 (!) 157/97 (!) 157/97  Pulse:  (!) 105 100   Resp:  (!) 26 (!) 25   Temp: 98.4 F (36.9 C)     TempSrc: Oral     SpO2:  91% 100%     Intake/Output Summary (Last 24 hours) at 06/13/2022 1109 Last data filed at 06/13/2022 1017 Gross per 24 hour  Intake 94.86 ml  Output 1375 ml  Net -1280.14 ml      06/07/2022    5:21 PM 04/21/2022    9:36 AM 04/09/2014    8:44 PM  Last 3 Weights  Weight (lbs) 167 lb 167 lb 3.2 oz   Weight (kg) 75.751 kg 75.841 kg      Information is confidential and restricted. Go to Review Flowsheets to unlock data.      Telemetry    Sinus with PACs - Personally Reviewed  ECG    No am tracing - Personally Reviewed  Physical Exam   GEN: No acute distress.   Neck: No JVD Cardiac: RRR, no murmurs, rubs, or gallops.  Respiratory: Clear to auscultation bilaterally. GI: Soft, nontender, non-distended  MS: No edema; No deformity. Neuro:  Nonfocal  Psych: Normal affect   Labs    High Sensitivity Troponin:    Recent Labs  Lab 06/12/22 0326 06/12/22 0530  TROPONINIHS 232* 219*     Chemistry Recent Labs  Lab 06/07/22 1733 06/12/22 0326 06/12/22 0624 06/13/22 0200  NA 137 139 138 139  K 3.7 4.1 4.1 3.8  CL 101 101  --  105  CO2 22 22  --  22  GLUCOSE 139* 106*  --  104*  BUN 40* 56*  --  65*  CREATININE 3.34* 5.07*  --  5.60*  CALCIUM 9.0 8.7*  --  8.6*  MG 1.9  --   --   --   GFRNONAA 19* 11*  --  10*  ANIONGAP 14 16*  --  12    Lipids No results for input(s): "CHOL", "TRIG", "HDL", "LABVLDL", "LDLCALC", "CHOLHDL" in the last 168 hours.  Hematology Recent Labs  Lab 06/07/22 1733 06/12/22 0326 06/12/22 0624 06/13/22 0200  WBC 9.7 10.7*  --  9.6  RBC 4.02* 3.60*  --  3.42*  HGB 11.9* 10.7* 10.5* 10.1*  HCT 35.3* 32.3* 31.0* 30.3*  MCV 87.8 89.7  --  88.6  MCH 29.6 29.7  --  29.5  MCHC 33.7 33.1  --  33.3  RDW 14.7 15.5  --  15.5  PLT 279 255  --  249   Thyroid No results for input(s): "TSH", "FREET4" in the last 168 hours.  BNPNo results for input(s): "BNP", "PROBNP" in the last 168 hours.  DDimer No results for input(s): "DDIMER" in the last 168 hours.   Radiology    ECHOCARDIOGRAM COMPLETE  Result Date: 06/12/2022    ECHOCARDIOGRAM REPORT   Patient Name:   Allen Cortez Date of Exam: 06/12/2022 Medical Rec #:  [id number]        Height:       70.0 in Accession #:    [id number]       Weight:       167.0 lb Date of Birth:  04-14-1951       BSA:          1.933 m Patient Age:    14 years         BP:           163/104 mmHg Patient Gender: M                HR:           96 bpm. Exam Location:  Inpatient Procedure: 2D Echo Indications:    dyspnea. Left ventricular clot.  History:        Patient has no prior history of Echocardiogram examinations.                 Risk Factors:Dyslipidemia, Hypertension, Diabetes, Current                 Smoker and Sleep Apnea.  Sonographer:    Johny Chess RDCS Referring Phys: [id number] Ruby  1. 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 Left ventricular ejection fraction, by estimation, is 30-35%. The left ventricle is abnormal. The left ventricle has focal regional wall motion abnormalities. The left ventricular internal cavity size was mildly dilated. There is akinesis of the left ventricular, apical septal wall, inferior wall, anterior wall and lateral wall. There is akinesis of the left ventricular, entire apical segment. There is severe hypokinesis of the left ventricular, basal-mid anterior wall. A falst tendon is present in the mid LV cavity.  2. Right ventricular systolic function is normal. The right ventricular size is normal. There is moderately elevated pulmonary artery systolic pressure. The estimated right ventricular systolic pressure is 76.5 mmHg.  3. The mitral valve is normal in structure. Mild mitral valve regurgitation. No evidence of mitral stenosis.  4. The aortic valve is tricuspid. There is moderate calcification of the aortic valve. Aortic valve regurgitation is not visualized. Aortic valve sclerosis/calcification is present, without any evidence of aortic stenosis.  5. The inferior vena cava is dilated in size with >50% respiratory variability, suggesting right atrial pressure of 8 mmHg. 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 Left ventricular ejection fraction, by estimation, is 30 to 35%. The left ventricle has moderately decreased function. The left ventricle demonstrates regional wall motion abnormalities. Definity contrast agent was given IV to delineate the left ventricular endocardial borders. The left ventricular internal cavity size was mildly dilated. There is no left  ventricular hypertrophy. Left ventricular diastolic parameters are indeterminate. Elevated left  ventricular end-diastolic pressure. Right Ventricle: The right ventricular size is normal. No increase in right ventricular wall thickness. Right ventricular systolic function is normal. There is moderately elevated pulmonary artery systolic pressure. The tricuspid regurgitant velocity is 3.06 m/s, and with an assumed right atrial pressure of 8 mmHg, the estimated right ventricular systolic pressure is 65.6 mmHg. Left Atrium: Left atrial size was normal in size. Right Atrium: Right atrial size was normal in size. Pericardium: There is no evidence of pericardial effusion. Mitral Valve: The mitral valve is normal in structure. Mild mitral annular calcification. Mild mitral valve regurgitation. No evidence of mitral valve stenosis. Tricuspid Valve: The tricuspid valve is normal in structure. Tricuspid valve regurgitation is mild . No evidence of tricuspid stenosis. Aortic Valve: The aortic valve is tricuspid. There is moderate calcification of the aortic valve. Aortic valve regurgitation is not visualized. Aortic valve sclerosis/calcification is present, without any evidence of aortic stenosis. Pulmonic Valve: The pulmonic valve was normal in structure. Pulmonic valve regurgitation is not visualized. No evidence of pulmonic stenosis. Aorta: The aortic root is normal in size and structure. Venous: The inferior vena cava is dilated in size with greater than 50% respiratory variability, suggesting right atrial pressure of 8 mmHg. IAS/Shunts: No atrial level shunt detected by color flow Doppler.  LEFT VENTRICLE PLAX 2D LVIDd:         5.90 cm   Diastology LVIDs:         4.80 cm   LV e' medial:    4.35 cm/s LV PW:         0.90 cm   LV E/e' medial:  21.0 LV IVS:        0.90 cm   LV e' lateral:   6.20 cm/s LVOT diam:     2.30 cm   LV E/e' lateral: 14.8 LV SV:         59 LV SV Index:   31 LVOT Area:     4.15 cm  RIGHT VENTRICLE             IVC RV S prime:     16.10 cm/s  IVC diam: 2.20 cm TAPSE (M-mode): 1.8 cm LEFT ATRIUM              Index        RIGHT ATRIUM           Index LA diam:        4.20 cm 2.17 cm/m   RA Area:     11.40 cm LA Vol (A2C):   40.5 ml 20.95 ml/m  RA Volume:   24.30 ml  12.57 ml/m LA Vol (A4C):   80.0 ml 41.38 ml/m LA Biplane Vol: 62.6 ml 32.38 ml/m  AORTIC VALVE LVOT Vmax:   84.80 cm/s LVOT Vmean:  56.100 cm/s LVOT VTI:    0.142 m  AORTA Ao Root diam: 3.20 cm Ao Asc diam:  3.20 cm MITRAL VALVE               TRICUSPID VALVE MV Area (PHT): 5.88 cm    TR Peak grad:   37.5 mmHg MV Decel Time: 129 msec    TR Vmax:        306.00 cm/s MV E velocity: 91.50 cm/s MV A velocity: 96.40 cm/s  SHUNTS MV E/A ratio:  0.95        Systemic VTI:  0.14 m  Systemic Diam: 2.30 cm Fransico Him MD Electronically signed by Fransico Him MD Signature Date/Time: 06/12/2022/5:48:41 PM    Final    US RENAL  Result Date: 06/12/2022 CLINICAL DATA:  Acute renal failure. Chronic kidney disease, stage IV. Partial left nephrectomy. EXAM: RENAL / URINARY TRACT ULTRASOUND COMPLETE COMPARISON:  MRI of the abdomen without and with contrast 09/12/2010. FINDINGS: Right Kidney: Renal measurements: 11.0 x 5.0 x 5.6 cm = volume: 162 mL. Renal parenchyma is isoechoic to the index organ, the liver. 2 simple cysts are present near the upper pole measuring 2.4 and 2.0 cm respectively. Left Kidney: Renal measurements: 9.5 x 5.5 x 4.7 cm = volume: 129 mL. Renal parenchyma is isoechoic to the index organ, the spleen. A simple cyst is present at the upper pole measuring up 2.2 cm and at the lower pole measuring up 1.8 cm. No stone or solid lesion is present. No obstruction is present. Bladder: Appears normal for degree of bladder distention. Other: None. IMPRESSION: 1. No acute abnormality. 2. Increased echogenicity of the renal parenchyma bilaterally. This is nonspecific, but commonly seen in the setting of medical renal disease. 3. Simple cysts of both kidneys.  Recommend no follow-up. Electronically Signed   By: San Morelle M.D.   On: 06/12/2022 14:32   DG Chest 2 View  Result Date: 06/12/2022 CLINICAL DATA:  Shortness of breath EXAM: CHEST - 2 VIEW COMPARISON:  CT chest dated 04/29/2022 FINDINGS: Subpleural reticulation/fibrosis in the lungs bilaterally, similar to prior CT chest, favoring chronic interstitial lung disease. No superimposed opacity suspicious for pneumonia. No pleural effusion or pneumothorax. The heart top-normal in size. Visualized osseous structures are within normal limits. IMPRESSION: Stable chronic interstitial lung disease. No evidence of acute cardiopulmonary disease. Electronically Signed   By: Julian Hy M.D.   On: 06/12/2022 04:00    Cardiac Studies   Echo 06/12/22:  1. 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  Left ventricular ejection fraction, by estimation, is 30-35%. The left  ventricle is abnormal. The left  ventricle has focal regional wall motion abnormalities. The left  ventricular internal cavity size was mildly dilated. There is akinesis of  the left ventricular, apical septal wall, inferior wall, anterior wall and  lateral wall. There is akinesis of the  left ventricular, entire apical segment. There is severe hypokinesis of  the left ventricular, basal-mid anterior wall. A falst tendon is present  in the mid LV cavity.   2. Right ventricular systolic function is normal. The right ventricular  size is normal. There is moderately elevated pulmonary artery systolic  pressure. The estimated right ventricular systolic pressure is 50.9 mmHg.   3. The mitral valve is normal in structure. Mild mitral valve  regurgitation. No evidence of mitral stenosis.   4. The aortic valve is tricuspid. There is moderate calcification of the  aortic valve. Aortic valve regurgitation is not visualized. Aortic valve  sclerosis/calcification is present, without any evidence of  aortic  stenosis.   5. The inferior vena cava is dilated in size with >50% respiratory  variability, suggesting right atrial pressure of 8 mmHg.    Patient Profile     71 y.o. male with a history of CAD, AAA status post repair 7 years ago, hypertension, hyperlipidemia, type 2 diabetes mellitus, OSA, GERD, renal cell CA status post partial left nephrectomy in 2016 with CKD stage IIIa, ischemic cardiomyopathy who  is admitted to [facility] with CHF. Pt had undergone workup for AAA and during that workup his echo showed severe LV dysfunction with LV apical thrombus. Cardiac cath at the [facility] on 05/31/22 with CTO of the LAD and severe mid Circumflex stenosis. He had plans to see CT surgery as an outpatient for CABG. Since his heart cath 3 weeks ago he has been having increasing shortness of breath along with lower extremity pain to the point he can barely walk.  He was seen in the ER on 9 5 and left AMA.  At that point he was in AKA with a creatinine of 3.34 with baseline of 1.5-1.6. He has continued severe renal insufficiency.   Assessment & Plan    CAD with angina: He has severe two vessel CAD (CTO of the mid LAD, severe mid Circumflex stenosis) by cath at the [facility] on 05/31/22. Cath films on CD in the back of our cath lab-attempting to load into our system.  -No chest pain today. Minimal troponin elevation.  -Continue ASA, statin, Imdur -Add Coreg 3.125 mg po BID -I have reviewed his cath films from the [place]. His best option for revascularization will be CABG but this will have to be planned once his renal function improves. Will ask CT surgery to see him. Will review need for cardiac MRI to assess viability of the anterior wall but this would have to also be planned when his renal insufficiency resolves.   Ischemic cardiomyopathy/Acute systolic CHF: Echo here with LVEF=30-35% with clear evidence of LV thrombus. Akinesis of the anterior wall. No significant valve disease. He is not volume overloaded on exam today.  Lasix yesterday IV per Nephrology team. Renal insufficiency felt by Nephrology team to be contrast related post cath.Unable to push GDMT at this time with renal insufficiency. Continue hydralazine and Imdur.  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 Continue IV heparin while undergoing inpatient evaluation. Consider change to coumadin when discharged.   HTN: BP slightly elevated. Will add Coreg.   CKD/Acute renal failure: Nephrology following. Suspect contrast induced nephropathy post cath.   AAA: Workup at [facility]. Will need repeat u/s here  For questions or updates, please contact [place] Please consult [email or web address] for contact info under        Signed, Lauree Chandler, MD  06/13/2022, 11:09 AM

## 2022-06-13 NOTE — ED Notes (Signed)
ED TO INPATIENT HANDOFF REPORT  ED Nurse Name and Phone #: [phone]  S Name/Age/Gender Allen Cortez 71 y.o. male Room/Bed: 043C/043C  Code Status   Code Status: Full Code  Home/SNF/Other Home Patient oriented to: self, place, time, and situation Is this baseline? Yes   Triage Complete: Triage complete  Chief Complaint Acute kidney injury (AKI) with acute tubular necrosis (ATN) ([place]) [G86.7] Acute diastolic heart failure ([place]) [I50.31]  Triage Note Patient arrived with EMS from home reports SOB with chest tightness , rales/crackles this evening . Denies fever or chills .    Allergies Allergies  Allergen Reactions   Dilaudid [Hydromorphone Hcl] Other (See Comments)    hallucinations   Mirapex [Pramipexole Dihydrochloride] Other (See Comments)    Leg pain   Prednisone Other (See Comments)    irritability    Level of Care/Admitting Diagnosis ED Disposition     ED Disposition  Admit   Condition  --   [place]: [place] [[id number]]  Level of Care: Telemetry Cardiac [[id number]]  May admit patient to [facility] or [facility] if equivalent level of care is available:: No  Covid Evaluation: Confirmed COVID Negative  Diagnosis: Acute diastolic heart failure ([place]) [428.31.ICD-9-CM]  Admitting Physician: Neena Rhymes [[id number]]  Attending Physician: Neena Rhymes [[id number]]  Certification:: I certify this patient will need inpatient services for at least 2 midnights          B Medical/Surgery History Past Medical History:  Diagnosis Date   AAA (abdominal aortic aneurysm) ([place])    Abdominal wall mass of left lower quadrant    Basal cell carcinoma    CAD (coronary artery disease) 2011   moderate   Colon polyp    DDD (degenerative disc disease)    Depression    Diabetes mellitus type II    no meds   GERD (gastroesophageal reflux disease)    Hyperlipidemia    Neuromuscular disorder (HCC)    Osteopenia    PAD (peripheral artery  disease) (HCC)    Renal cell carcinoma    Restless leg syndrome    Sleep apnea    used a cpap yr ago-does not snore or use it now.   Past Surgical History:  Procedure Laterality Date   ABDOMINAL AORTIC ANEURYSM REPAIR     HEMORRHOID SURGERY     HERNIA REPAIR  03/06/12   ventral hernia repair   NEPHRECTOMY     left due to renal cell carcenoma   VENTRAL HERNIA REPAIR  03/06/2012   Procedure: HERNIA REPAIR VENTRAL ADULT;  Surgeon: Joyice Faster. Cornett, MD;  Location: [facility];  Service: General;  Laterality: N/A;  excision of stitch granuloma     A IV Location/Drains/Wounds Patient Lines/Drains/Airways Status     Active Line/Drains/Airways     Name Placement date Placement time Site Days   Peripheral IV 06/12/22 18 G 1" Left Antecubital 06/12/22  0531  Antecubital  1   Peripheral IV 06/12/22 18 G Right Antecubital 06/12/22  --  Antecubital  1   Incision 03/06/12 Abdomen Other (Comment) 03/06/12  0941  -- 3751            Intake/Output Last 24 hours  Intake/Output Summary (Last 24 hours) at 06/13/2022 1246 Last data filed at 06/13/2022 0212 Gross per 24 hour  Intake 94.86 ml  Output 1375 ml  Net -1280.14 ml    Labs/Imaging Results for orders placed or performed during the hospital encounter of 06/12/22 (  from the past 48 hour(s))  Basic metabolic panel     Status: Abnormal   Collection Time: 06/12/22  3:26 AM  Result Value Ref Range   Sodium 139 135 - 145 mmol/L   Potassium 4.1 3.5 - 5.1 mmol/L   Chloride 101 98 - 111 mmol/L   CO2 22 22 - 32 mmol/L   Glucose, Bld 106 (H) 70 - 99 mg/dL    Comment: Glucose reference range applies only to samples taken after fasting for at least 8 hours.   BUN 56 (H) 8 - 23 mg/dL   Creatinine, Ser 5.07 (H) 0.61 - 1.24 mg/dL   Calcium 8.7 (L) 8.9 - 10.3 mg/dL   GFR, Estimated 11 (L) >60 mL/min    Comment: (NOTE) Calculated using the CKD-EPI Creatinine Equation (2021)    Anion gap 16 (H) 5 - 15    Comment: Performed at  [facility] [address], [place], [place] [address]  CBC     Status: Abnormal   Collection Time: 06/12/22  3:26 AM  Result Value Ref Range   WBC 10.7 (H) 4.0 - 10.5 K/uL   RBC 3.60 (L) 4.22 - 5.81 MIL/uL   Hemoglobin 10.7 (L) 13.0 - 17.0 g/dL   HCT 32.3 (L) 39.0 - 52.0 %   MCV 89.7 80.0 - 100.0 fL   MCH 29.7 26.0 - 34.0 pg   MCHC 33.1 30.0 - 36.0 g/dL   RDW 15.5 11.5 - 15.5 %   Platelets 255 150 - 400 K/uL   nRBC 0.0 0.0 - 0.2 %    Comment: Performed at [facility], [place] [address] [place], [place] [address]  Troponin I (High Sensitivity)     Status: Abnormal   Collection Time: 06/12/22  3:26 AM  Result Value Ref Range   Troponin I (High Sensitivity) 232 (HH) <18 ng/L    Comment: CRITICAL RESULT CALLED TO, READ BACK BY AND VERIFIED WITH R. SANGALANG, RN, [phone] 06/12/22, A. RAMSEY (NOTE) Elevated high sensitivity troponin I (hsTnI) values and significant  changes across serial measurements may suggest ACS but many other  chronic and acute conditions are known to elevate hsTnI results.  Refer to the "Links" section for chest pain algorithms and additional  guidance. Performed at [facility], [place] [address], [place], [place] [address]   Protime-INR (order if Patient is taking Coumadin / Warfarin)     Status: Abnormal   Collection Time: 06/12/22  3:26 AM  Result Value Ref Range   Prothrombin Time 15.6 (H) 11.4 - 15.2 seconds   INR 1.3 (H) 0.8 - 1.2    Comment: (NOTE) INR goal varies based on device and disease states. Performed at [facility], [place] [address], [place], [place] [address]   Troponin I (High Sensitivity)     Status: Abnormal   Collection Time: 06/12/22  5:30 AM  Result Value Ref Range   Troponin I (High Sensitivity) 219 (HH) <18 ng/L    Comment: CRITICAL VALUE NOTED.  VALUE IS CONSISTENT WITH PREVIOUSLY REPORTED AND CALLED VALUE. (NOTE) Elevated high sensitivity troponin I (hsTnI) values and significant  changes across serial  measurements may suggest ACS but many other  chronic and acute conditions are known to elevate hsTnI results.  Refer to the "Links" section for chest pain algorithms and additional  guidance. Performed at [facility], [place] [address], [place], [place] [address]   SARS Coronavirus 2 by RT PCR (hospital order, performed in  [facility] hospital lab) *cepheid single result test* Anterior Nasal Swab     Status: None   Collection Time: 06/12/22  5:58 AM   Specimen: Anterior Nasal Swab  Result Value Ref Range   SARS Coronavirus 2 by RT PCR NEGATIVE NEGATIVE    Comment: (NOTE) SARS-CoV-2 target nucleic acids are NOT DETECTED.  The SARS-CoV-2 RNA is generally detectable in upper and lower respiratory specimens during the acute phase of infection. The lowest concentration of SARS-CoV-2 viral copies this assay can detect is 250 copies / mL. A negative result does not preclude SARS-CoV-2 infection and should not be used as the sole basis for treatment or other patient management decisions.  A negative result may occur with improper specimen collection / handling, submission of specimen other than nasopharyngeal swab, presence of viral mutation(s) within the areas targeted by this assay, and inadequate number of viral copies (<250 copies / mL). A negative result must be combined with clinical observations, patient history, and epidemiological information.  Fact Sheet for Patients:   https://www.patel.info/  Fact Sheet for Healthcare Providers: https://hall.com/  This test is not yet approved or  cleared by the [place] FDA and has been authorized for detection and/or diagnosis of SARS-CoV-2 by FDA under an Emergency Use Authorization (EUA).  This EUA will remain in effect (meaning this test can be used) for the duration of the COVID-19 declaration under Section 564(b)(1) of the Act, 21 U.S.C. section 360bbb-3(b)(1), unless the  authorization is terminated or revoked sooner.  Performed at [facility], [place] [address], [place], [place] [address]   I-Stat arterial blood gas, ED [facility] only)     Status: Abnormal   Collection Time: 06/12/22  6:24 AM  Result Value Ref Range   pH, Arterial 7.394 7.35 - 7.45   pCO2 arterial 35.8 32 - 48 mmHg   pO2, Arterial 65 (L) 83 - 108 mmHg   Bicarbonate 22.0 20.0 - 28.0 mmol/L   TCO2 23 22 - 32 mmol/L   O2 Saturation 93 %   Acid-base deficit 3.0 (H) 0.0 - 2.0 mmol/L   Sodium 138 135 - 145 mmol/L   Potassium 4.1 3.5 - 5.1 mmol/L   Calcium, Ion 1.16 1.15 - 1.40 mmol/L   HCT 31.0 (L) 39.0 - 52.0 %   Hemoglobin 10.5 (L) 13.0 - 17.0 g/dL   Patient temperature 97.6 F    Collection site RADIAL, ALLEN'S TEST ACCEPTABLE    Drawn by RT    Sample type ARTERIAL   CBG monitoring, ED     Status: Abnormal   Collection Time: 06/12/22  4:23 PM  Result Value Ref Range   Glucose-Capillary 194 (H) 70 - 99 mg/dL    Comment: Glucose reference range applies only to samples taken after fasting for at least 8 hours.  APTT     Status: Abnormal   Collection Time: 06/12/22  4:30 PM  Result Value Ref Range   aPTT 44 (H) 24 - 36 seconds    Comment:        IF BASELINE aPTT IS ELEVATED, SUGGEST PATIENT RISK ASSESSMENT BE USED TO DETERMINE APPROPRIATE ANTICOAGULANT THERAPY. Performed at [facility], [place] [address] [place], [place] [address]   Heparin level (unfractionated)     Status: Abnormal   Collection Time: 06/12/22  4:30 PM  Result Value Ref Range   Heparin Unfractionated >1.10 (H) 0.30 - 0.70 IU/mL    Comment: (NOTE) The clinical reportable range upper limit is being lowered  to >1.10 to align with the FDA approved guidance for the current laboratory assay.  If heparin results are below expected values, and patient dosage has  been confirmed, suggest follow up testing of antithrombin III levels. Performed at [facility], [place] [address] [place],  [place] [address]   Urinalysis, Routine w reflex microscopic     Status: Abnormal   Collection Time: 06/12/22  5:20 PM  Result Value Ref Range   Color, Urine YELLOW YELLOW   APPearance CLEAR CLEAR   Specific Gravity, Urine 1.010 1.005 - 1.030   pH 5.0 5.0 - 8.0   Glucose, UA NEGATIVE NEGATIVE mg/dL   Hgb urine dipstick SMALL (A) NEGATIVE   Bilirubin Urine NEGATIVE NEGATIVE   Ketones, ur NEGATIVE NEGATIVE mg/dL   Protein, ur 30 (A) NEGATIVE mg/dL   Nitrite NEGATIVE NEGATIVE   Leukocytes,Ua NEGATIVE NEGATIVE   RBC / HPF 0-5 0 - 5 RBC/hpf   WBC, UA 0-5 0 - 5 WBC/hpf   Bacteria, UA NONE SEEN NONE SEEN    Comment: Performed at [facility], [place] [address], [place], [place] [address]  Creatinine, urine, random     Status: None   Collection Time: 06/12/22  5:20 PM  Result Value Ref Range   Creatinine, Urine 67 mg/dL    Comment: Performed at [facility] [address] [place], [place] [address]  Sodium, urine, random     Status: None   Collection Time: 06/12/22  5:20 PM  Result Value Ref Range   Sodium, Ur 43 mmol/L    Comment: Performed at [facility] [address], [place], [place] [address]  CBG monitoring, ED     Status: Abnormal   Collection Time: 06/12/22  9:47 PM  Result Value Ref Range   Glucose-Capillary 120 (H) 70 - 99 mg/dL    Comment: Glucose reference range applies only to samples taken after fasting for at least 8 hours.  APTT     Status: Abnormal   Collection Time: 06/13/22  2:00 AM  Result Value Ref Range   aPTT 57 (H) 24 - 36 seconds    Comment:        IF BASELINE aPTT IS ELEVATED, SUGGEST PATIENT RISK ASSESSMENT BE USED TO DETERMINE APPROPRIATE ANTICOAGULANT THERAPY. Performed at [facility], [place] [address] [place], [place] [address]   CBC     Status: Abnormal   Collection Time: 06/13/22  2:00 AM  Result Value Ref Range   WBC 9.6 4.0 - 10.5 K/uL   RBC 3.42 (L) 4.22 - 5.81 MIL/uL   Hemoglobin 10.1 (L) 13.0 - 17.0 g/dL   HCT 30.3 (L)  39.0 - 52.0 %   MCV 88.6 80.0 - 100.0 fL   MCH 29.5 26.0 - 34.0 pg   MCHC 33.3 30.0 - 36.0 g/dL   RDW 15.5 11.5 - 15.5 %   Platelets 249 150 - 400 K/uL   nRBC 0.0 0.0 - 0.2 %    Comment: Performed at [facility], [place] [address], [place],  [address]  Basic metabolic panel     Status: Abnormal   Collection Time: 06/13/22  2:00 AM  Result Value Ref Range   Sodium 139 135 - 145 mmol/L   Potassium 3.8 3.5 - 5.1 mmol/L   Chloride 105 98 - 111 mmol/L   CO2 22 22 - 32 mmol/L   Glucose, Bld 104 (H) 70 - 99 mg/dL    Comment: Glucose  reference range applies only to samples taken after fasting for at least 8 hours.   BUN 65 (H) 8 - 23 mg/dL   Creatinine, Ser 5.60 (H) 0.61 - 1.24 mg/dL   Calcium 8.6 (L) 8.9 - 10.3 mg/dL   GFR, Estimated 10 (L) >60 mL/min    Comment: (NOTE) Calculated using the CKD-EPI Creatinine Equation (2021)    Anion gap 12 5 - 15    Comment: Performed at [facility] [address] [place], [place] [address]  Heparin level (unfractionated)     Status: Abnormal   Collection Time: 06/13/22  2:00 AM  Result Value Ref Range   Heparin Unfractionated >1.10 (H) 0.30 - 0.70 IU/mL    Comment: (NOTE) The clinical reportable range upper limit is being lowered to >1.10 to align with the FDA approved guidance for the current laboratory assay.  If heparin results are below expected values, and patient dosage has  been confirmed, suggest follow up testing of antithrombin III levels. Performed at [facility], [place] [address] [place], [place] [address]   Hemoglobin A1c     Status: Abnormal   Collection Time: 06/13/22  4:08 AM  Result Value Ref Range   Hgb A1c MFr Bld 6.1 (H) 4.8 - 5.6 %    Comment: (NOTE) Pre diabetes:          5.7%-6.4%  Diabetes:              >6.4%  Glycemic control for   <7.0% adults with diabetes    Mean Plasma Glucose 128.37 mg/dL    Comment: Performed at [facility] [address] [place], [place] [address]   Heparin level (unfractionated)     Status: Abnormal   Collection Time: 06/13/22  4:08 AM  Result Value Ref Range   Heparin Unfractionated >1.10 (H) 0.30 - 0.70 IU/mL    Comment: (NOTE) The clinical reportable range upper limit is being lowered to >1.10 to align with the FDA approved guidance for the current laboratory assay.  If heparin results are below expected values, and patient dosage has  been confirmed, suggest follow up testing of antithrombin III levels. Performed at [facility], [place] [address], [place], [place] [address]   CBG monitoring, ED     Status: Abnormal   Collection Time: 06/13/22  7:55 AM  Result Value Ref Range   Glucose-Capillary 109 (H) 70 - 99 mg/dL    Comment: Glucose reference range applies only to samples taken after fasting for at least 8 hours.  Uric acid     Status: Abnormal   Collection Time: 06/13/22 10:15 AM  Result Value Ref Range   Uric Acid, Serum 9.6 (H) 3.7 - 8.6 mg/dL    Comment: Performed at [facility] [address] [place], [place] [address]  CBG monitoring, ED     Status: Abnormal   Collection Time: 06/13/22 11:35 AM  Result Value Ref Range   Glucose-Capillary 101 (H) 70 - 99 mg/dL    Comment: Glucose reference range applies only to samples taken after fasting for at least 8 hours.   ECHOCARDIOGRAM COMPLETE  Result Date: 06/12/2022    ECHOCARDIOGRAM REPORT   Patient Name:   Allen Cortez Date of Exam: 06/12/2022 Medical Rec #:  [id number]        Height:       70.0 in Accession #:    [id number]       Weight:  167.0 lb Date of Birth:  [birth year]       BSA:          1.933 m Patient Age:    84 years         BP:           163/104 mmHg Patient Gender: M                HR:           96 bpm. Exam Location:  Inpatient Procedure: 2D Echo Indications:    dyspnea. Left ventricular clot.  History:        Patient has no prior history of Echocardiogram examinations.                 Risk Factors:Dyslipidemia, Hypertension, Diabetes,  Current                 Smoker and Sleep Apnea.  Sonographer:    Johny Chess RDCS Referring Phys: [id number] [place]  1. 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 Left ventricular ejection fraction, by estimation, is 30-35%. The left ventricle is abnormal. The left ventricle has focal regional wall motion abnormalities. The left ventricular internal cavity size was mildly dilated. There is akinesis of the left ventricular, apical septal wall, inferior wall, anterior wall and lateral wall. There is akinesis of the left ventricular, entire apical segment. There is severe hypokinesis of the left ventricular, basal-mid anterior wall. A falst tendon is present in the mid LV cavity.  2. Right ventricular systolic function is normal. The right ventricular size is normal. There is moderately elevated pulmonary artery systolic pressure. The estimated right ventricular systolic pressure is 21.3 mmHg.  3. The mitral valve is normal in structure. Mild mitral valve regurgitation. No evidence of mitral stenosis.  4. The aortic valve is tricuspid. There is moderate calcification of the aortic valve. Aortic valve regurgitation is not visualized. Aortic valve sclerosis/calcification is present, without any evidence of aortic stenosis.  5. The inferior vena cava is dilated in size with >50% respiratory variability, suggesting right atrial pressure of 8 mmHg. 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 Left ventricular ejection fraction, by estimation, is 30 to 35%. The left ventricle has moderately decreased function. The left ventricle demonstrates regional wall motion abnormalities. Definity contrast agent was given IV to delineate the left ventricular endocardial borders. The left  ventricular internal cavity size was mildly dilated. There is no left ventricular hypertrophy. Left ventricular diastolic parameters are indeterminate. Elevated left ventricular end-diastolic pressure. Right Ventricle: The right ventricular size is normal. No increase in right ventricular wall thickness. Right ventricular systolic function is normal. There is moderately elevated pulmonary artery systolic pressure. The tricuspid regurgitant velocity is 3.06 m/s, and with an assumed right atrial pressure of 8 mmHg, the estimated right ventricular systolic pressure is 08.6 mmHg. Left Atrium: Left atrial size was normal in size. Right Atrium: Right atrial size was normal in size. Pericardium: There is no evidence of pericardial effusion. Mitral Valve: The mitral valve is normal in structure. Mild mitral annular calcification. Mild mitral valve regurgitation. No evidence of mitral valve stenosis. Tricuspid Valve: The tricuspid valve is normal in structure. Tricuspid valve regurgitation is mild . No evidence of tricuspid stenosis. Aortic Valve: The  aortic valve is tricuspid. There is moderate calcification of the aortic valve. Aortic valve regurgitation is not visualized. Aortic valve sclerosis/calcification is present, without any evidence of aortic stenosis. Pulmonic Valve: The pulmonic valve was normal in structure. Pulmonic valve regurgitation is not visualized. No evidence of pulmonic stenosis. Aorta: The aortic root is normal in size and structure. Venous: The inferior vena cava is dilated in size with greater than 50% respiratory variability, suggesting right atrial pressure of 8 mmHg. IAS/Shunts: No atrial level shunt detected by color flow Doppler.  LEFT VENTRICLE PLAX 2D LVIDd:         5.90 cm   Diastology LVIDs:         4.80 cm   LV e' medial:    4.35 cm/s LV PW:         0.90 cm   LV E/e' medial:  21.0 LV IVS:        0.90 cm   LV e' lateral:   6.20 cm/s LVOT diam:     2.30 cm   LV E/e' lateral: 14.8 LV SV:          59 LV SV Index:   31 LVOT Area:     4.15 cm  RIGHT VENTRICLE             IVC RV S prime:     16.10 cm/s  IVC diam: 2.20 cm TAPSE (M-mode): 1.8 cm LEFT ATRIUM             Index        RIGHT ATRIUM           Index LA diam:        4.20 cm 2.17 cm/m   RA Area:     11.40 cm LA Vol (A2C):   40.5 ml 20.95 ml/m  RA Volume:   24.30 ml  12.57 ml/m LA Vol (A4C):   80.0 ml 41.38 ml/m LA Biplane Vol: 62.6 ml 32.38 ml/m  AORTIC VALVE LVOT Vmax:   84.80 cm/s LVOT Vmean:  56.100 cm/s LVOT VTI:    0.142 m  AORTA Ao Root diam: 3.20 cm Ao Asc diam:  3.20 cm MITRAL VALVE               TRICUSPID VALVE MV Area (PHT): 5.88 cm    TR Peak grad:   37.5 mmHg MV Decel Time: 129 msec    TR Vmax:        306.00 cm/s MV E velocity: 91.50 cm/s MV A velocity: 96.40 cm/s  SHUNTS MV E/A ratio:  0.95        Systemic VTI:  0.14 m                            Systemic Diam: 2.30 cm Fransico Him MD Electronically signed by Fransico Him MD Signature Date/Time: 06/12/2022/5:48:41 PM    Final    US RENAL  Result Date: 06/12/2022 CLINICAL DATA:  Acute renal failure. Chronic kidney disease, stage IV. Partial left nephrectomy. EXAM: RENAL / URINARY TRACT ULTRASOUND COMPLETE COMPARISON:  MRI of the abdomen without and with contrast 09/12/2010. FINDINGS: Right Kidney: Renal measurements: 11.0 x 5.0 x 5.6 cm = volume: 162 mL. Renal parenchyma is isoechoic to the index organ, the liver. 2 simple cysts are present near the upper pole measuring 2.4 and 2.0 cm respectively. Left Kidney: Renal measurements: 9.5 x 5.5 x 4.7 cm = volume: 129 mL. Renal parenchyma is isoechoic to  the index organ, the spleen. A simple cyst is present at the upper pole measuring up 2.2 cm and at the lower pole measuring up 1.8 cm. No stone or solid lesion is present. No obstruction is present. Bladder: Appears normal for degree of bladder distention. Other: None. IMPRESSION: 1. No acute abnormality. 2. Increased echogenicity of the renal parenchyma bilaterally. This is  nonspecific, but commonly seen in the setting of medical renal disease. 3. Simple cysts of both kidneys.  Recommend no follow-up. Electronically Signed   By: San Morelle M.D.   On: 06/12/2022 14:32   DG Chest 2 View  Result Date: 06/12/2022 CLINICAL DATA:  Shortness of breath EXAM: CHEST - 2 VIEW COMPARISON:  CT chest dated 04/29/2022 FINDINGS: Subpleural reticulation/fibrosis in the lungs bilaterally, similar to prior CT chest, favoring chronic interstitial lung disease. No superimposed opacity suspicious for pneumonia. No pleural effusion or pneumothorax. The heart top-normal in size. Visualized osseous structures are within normal limits. IMPRESSION: Stable chronic interstitial lung disease. No evidence of acute cardiopulmonary disease. Electronically Signed   By: Julian Hy M.D.   On: 06/12/2022 04:00    Pending Labs Unresulted Labs (From admission, onward)     Start     Ordered   06/13/22 1230  APTT  Once,   TIMED        06/13/22 1230   06/13/22 0500  Heparin level (unfractionated)  Daily at 5am,   R      06/12/22 0606   06/13/22 0500  APTT  Daily at 5am,   R      06/12/22 0606   06/13/22 0500  CBC  Daily at 5am,   R      06/12/22 0606   06/13/22 2725  Basic metabolic panel  Daily at 5am,   R     Comments: As Scheduled for 5 days    06/12/22 1200   06/12/22 0555  Brain natriuretic peptide  Once,   URGENT        06/12/22 0554   06/12/22 0549  CK  Once,   URGENT        06/12/22 0548            Vitals/Pain Today's Vitals   06/13/22 1150 06/13/22 1200 06/13/22 1205 06/13/22 1230  BP:    (!) 146/97  Pulse: (!) 103 (!) 115 100 99  Resp: 20 (!) 23 (!) 21 20  Temp:      TempSrc:      SpO2: 94% 91% 92% 94%  PainSc:        Isolation Precautions Airborne and Contact precautions  Medications Medications  heparin ADULT infusion 100 units/mL (25000 units/28m) (1,350 Units/hr Intravenous Rate/Dose Change 06/13/22 0342)  hydrALAZINE (APRESOLINE) tablet 10 mg  (10 mg Oral Given 06/13/22 0526)  isosorbide mononitrate (IMDUR) 24 hr tablet 15 mg (15 mg Oral Given 06/13/22 1010)  amLODipine (NORVASC) tablet 10 mg (10 mg Oral Given 06/13/22 1009)  aspirin EC tablet 81 mg (81 mg Oral Given 06/13/22 1010)  sodium chloride flush (NS) 0.9 % injection 3 mL (3 mLs Intravenous Given 06/13/22 1017)  sodium chloride flush (NS) 0.9 % injection 3 mL (has no administration in time range)  0.9 %  sodium chloride infusion (has no administration in time range)  acetaminophen (TYLENOL) tablet 650 mg (has no administration in time range)  ondansetron (ZOFRAN) injection 4 mg (has no administration in time range)  pravastatin (PRAVACHOL) tablet 40 mg (40 mg Oral Given 06/12/22 1930)  citalopram (CELEXA)  tablet 40 mg (40 mg Oral Given 06/12/22 1929)  QUEtiapine (SEROQUEL) tablet 300 mg (300 mg Oral Given 06/12/22 2118)  pantoprazole (PROTONIX) EC tablet 40 mg (40 mg Oral Given 06/13/22 1010)  clonazePAM (KLONOPIN) tablet 0.5 mg (0.5 mg Oral Given 06/13/22 1010)  insulin aspart (novoLOG) injection 0-15 Units ( Subcutaneous Not Given 06/13/22 1137)  perflutren lipid microspheres (DEFINITY) IV suspension (4 mLs Intravenous Given 06/12/22 1705)  carvedilol (COREG) tablet 3.125 mg (3.125 mg Oral Given 06/13/22 1204)  furosemide (LASIX) 100 mg in dextrose 5 % 50 mL IVPB (0 mg Intravenous Stopped 06/12/22 2241)  heparin bolus via infusion 2,000 Units (2,000 Units Intravenous Bolus from Bag 06/12/22 1804)    Mobility walks with device Low fall risk   Focused Assessments    R Recommendations: See Admitting Provider Note  Report given to:   Additional Notes:

## 2022-06-13 NOTE — Discharge Planning (Signed)
RNCM met with pt and wife [name]) at bedside as requested.  Pt son called TOC office and relayed a message that pt did not have a blanket or pillow.  RNCM took warm blankets but was un able to locate a pillow.  RNCM let pt and wife know that we were out of pillows at this moment by that pt will be transferring to his hospital room shortly after lunch and that he will be provided with a pillow.  Pt and wife very appreciative of information.  Pt sitting up on bedside eating lunch ([place] with gravy, mashed potato and mixed vegetables when RNCM exited the room.

## 2022-06-13 NOTE — Progress Notes (Signed)
ANTICOAGULATION CONSULT NOTE - Follow-up Note  Pharmacy Consult for Heparin Indication: chest pain/ACS & LV thrombus  Allergies  Allergen Reactions   Dilaudid [Hydromorphone Hcl] Other (See Comments)    hallucinations   Mirapex [Pramipexole Dihydrochloride] Other (See Comments)    Leg pain   Prednisone Other (See Comments)    irritability    Patient Measurements: Height: '5\' 10"'$  (177.8 cm) Weight: 74.6 kg (164 lb 7.4 oz) IBW/kg (Calculated) : 73 Heparin Dosing Weight: 75.8kg  Vital Signs: Temp: 98.4 F (36.9 C) (09/11 2028) Temp Source: Oral (09/11 2028) BP: 139/98 (09/11 2028) Pulse Rate: 91 (09/11 1447)  Labs: Recent Labs    06/12/22 0326 06/12/22 0530 06/12/22 0624 06/12/22 1630 06/12/22 1630 06/13/22 0200 06/13/22 0408 06/13/22 1446 06/13/22 2209  HGB 10.7*  --  10.5*  --   --  10.1*  --   --   --   HCT 32.3*  --  31.0*  --   --  30.3*  --   --   --   PLT 255  --   --   --   --  249  --   --   --   APTT  --   --   --  44*   < > 57*  --  74* 71*  LABPROT 15.6*  --   --   --   --   --   --   --   --   INR 1.3*  --   --   --   --   --   --   --   --   HEPARINUNFRC  --   --   --  >1.10*  --  >1.10* >1.10*  --   --   CREATININE 5.07*  --   --   --   --  5.60*  --   --   --   TROPONINIHS 232* 219*  --   --   --   --   --   --   --    < > = values in this interval not displayed.     Estimated Creatinine Clearance: 12.3 mL/min (A) (by C-G formula based on SCr of 5.6 mg/dL (H)).   Medical History: Past Medical History:  Diagnosis Date   AAA (abdominal aortic aneurysm) (HCC)    Abdominal wall mass of left lower quadrant    Basal cell carcinoma    CAD (coronary artery disease) 2011   moderate   Colon polyp    DDD (degenerative disc disease)    Depression    Diabetes mellitus type II    no meds   GERD (gastroesophageal reflux disease)    Hyperlipidemia    Neuromuscular disorder (HCC)    Osteopenia    PAD (peripheral artery disease) (HCC)    Renal cell  carcinoma    Restless leg syndrome    Sleep apnea    used a cpap yr ago-does not snore or use it now.    Medications:  Medications Prior to Admission  Medication Sig Dispense Refill Last Dose   amLODipine (NORVASC) 10 MG tablet Take 10 mg by mouth every evening.   06/11/2022   apixaban (ELIQUIS) 5 MG TABS tablet Take 5 mg by mouth 2 (two) times daily.   06/11/2022 at 2000   atorvastatin (LIPITOR) 40 MG tablet Take 40 mg by mouth at bedtime.   06/11/2022   citalopram (CELEXA) 40 MG tablet Take 40 mg by mouth every evening.  06/11/2022   clonazePAM (KLONOPIN) 0.5 MG tablet Take 0.5 mg by mouth at bedtime.   06/11/2022   gabapentin (NEURONTIN) 300 MG capsule Take 300 mg by mouth 3 (three) times daily.   06/11/2022   lansoprazole (PREVACID) 15 MG capsule Take 15 mg by mouth every morning.   06/11/2022   nicotine (NICODERM CQ - DOSED IN MG/24 HOURS) 21 mg/24hr patch Place 21 mg onto the skin daily.   06/12/2022   QUEtiapine (SEROQUEL) 300 MG tablet Take 300 mg by mouth at bedtime.   06/11/2022    Scheduled:   allopurinol  100 mg Oral Daily   amLODipine  10 mg Oral Daily   aspirin EC  81 mg Oral Daily   carvedilol  3.125 mg Oral BID WC   citalopram  40 mg Oral QPM   clonazePAM  0.5 mg Oral Daily   hydrALAZINE  10 mg Oral Q8H   insulin aspart  0-15 Units Subcutaneous TID WC   isosorbide mononitrate  15 mg Oral Daily   pantoprazole  40 mg Oral Daily   pravastatin  40 mg Oral q1800   QUEtiapine  300 mg Oral QHS   sodium chloride flush  3 mL Intravenous Q12H   Infusions:   sodium chloride     heparin 1,350 Units/hr (06/13/22 2107)   PRN: sodium chloride, acetaminophen, ondansetron (ZOFRAN) IV, sodium chloride flush  Assessment: 46 yom with a 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 Patient presented on 06/12/22 with SOB. Heparin per pharmacy consult placed for chest pain/ACS and LV  thrombus .  Patient was on apixaban prior to arrival. Last dose 9/9 at 2000. Utilizing aPTT monitoring due to likely falsely high anti-Xa level secondary to DOAC use.  Repeat aPTT remains therapeutic.  Goal of Therapy:  Heparin level 0.3-0.7 units/ml aPTT 66-102 seconds Monitor platelets by anticoagulation protocol: Yes   Plan:  Continue IV heparin 1350 units/h Daily aPTT, heparin level, CBC   Arrie Senate, PharmD, BCPS, [facility] Clinical Pharmacist Please check AMION for all [facility] Pharmacy numbers 06/13/2022

## 2022-06-13 NOTE — Progress Notes (Signed)
ANTICOAGULATION CONSULT NOTE - Follow-up Note  Pharmacy Consult for Heparin Indication: chest pain/ACS & LV thrombus  Allergies  Allergen Reactions   Dilaudid [Hydromorphone Hcl] Other (See Comments)    hallucinations   Mirapex [Pramipexole Dihydrochloride] Other (See Comments)    Leg pain   Prednisone Other (See Comments)    irritability    Patient Measurements:   Heparin Dosing Weight: 75.8kg  Vital Signs: Temp: 97.8 F (36.6 C) (09/11 0243) Temp Source: Oral (09/11 0243) BP: 146/88 (09/11 0239) Pulse Rate: 105 (09/11 0239)  Labs: Recent Labs    06/12/22 0326 06/12/22 0530 06/12/22 0624 06/12/22 1630 06/13/22 0200  HGB 10.7*  --  10.5*  --  10.1*  HCT 32.3*  --  31.0*  --  30.3*  PLT 255  --   --   --  249  APTT  --   --   --  44* 57*  LABPROT 15.6*  --   --   --   --   INR 1.3*  --   --   --   --   HEPARINUNFRC  --   --   --  >1.10* >1.10*  CREATININE 5.07*  --   --   --  5.60*  TROPONINIHS 232* 219*  --   --   --      Estimated Creatinine Clearance: 12.3 mL/min (A) (by C-G formula based on SCr of 5.6 mg/dL (H)).   Medical History: Past Medical History:  Diagnosis Date   AAA (abdominal aortic aneurysm) (HCC)    Abdominal wall mass of left lower quadrant    Basal cell carcinoma    CAD (coronary artery disease) 2011   moderate   Colon polyp    DDD (degenerative disc disease)    Depression    Diabetes mellitus type II    no meds   GERD (gastroesophageal reflux disease)    Hyperlipidemia    Neuromuscular disorder (HCC)    Osteopenia    PAD (peripheral artery disease) (HCC)    Renal cell carcinoma    Restless leg syndrome    Sleep apnea    used a cpap yr ago-does not snore or use it now.    Medications:  (Not in a hospital admission)  Scheduled:   amLODipine  10 mg Oral Daily   aspirin EC  81 mg Oral Daily   citalopram  40 mg Oral QPM   clonazePAM  0.5 mg Oral Daily   gabapentin  300 mg Oral QHS   hydrALAZINE  10 mg Oral Q8H   insulin  aspart  0-15 Units Subcutaneous TID WC   isosorbide mononitrate  15 mg Oral Daily   pantoprazole  40 mg Oral Daily   pravastatin  40 mg Oral q1800   QUEtiapine  300 mg Oral QHS   sodium chloride flush  3 mL Intravenous Q12H   Infusions:   sodium chloride     heparin 1,150 Units/hr (06/13/22 0211)   PRN: sodium chloride, acetaminophen, ondansetron (ZOFRAN) IV, sodium chloride flush  Assessment: 62 yom with a 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 Patient is presenting with SOB. Heparin per pharmacy consult placed for chest pain/ACS and LV thrombus .  Patient was on apixaban prior to arrival. Last dose 9/9 at 2000. Utilizing aPTT monitoring due to likely falsely high anti-Xa level secondary to DOAC use.  Heparin level remains  falsely elevated from recent DOAC use. aPTT is slightly below goal at 57 seconds.   Goal of Therapy:  Heparin level 0.3-0.7 units/ml aPTT 66-102 seconds Monitor platelets by anticoagulation protocol: Yes   Plan:  Increase heparin to 1350 units/h Recheck aPTT in 8h  Allen Cortez, PharmD, [place], [facility] Clinical Pharmacist Please check AMION for all [facility] Pharmacy numbers 06/13/2022

## 2022-06-13 NOTE — ED Notes (Signed)
Lunch tray at bedside. ?

## 2022-06-13 NOTE — Discharge Planning (Signed)
Pt active at [facility] Provider: Collins Scotland, MD SW: [place] phone: [phone] Please call [place] transfer coordinator for additional information [phone]

## 2022-06-13 NOTE — Progress Notes (Signed)
PROGRESS NOTE    Allen Cortez  KJZ:[id number] DOB: 08-Jan-1951 DOA: 06/12/2022 PCP: Gerome Sam, MD   Brief Narrative: 71 year old with past medical history significant for mild to moderate nonobstructive CAD on cath 2011, AAA s/p repair 2014 2015, hypertension, hyperlipidemia, diabetes type 2, obstructive sleep apnea, GERD, renal cell carcinoma status post partial left nephrectomy around 2015--2016, CKD stage IIIa, abdominal wall mass left lower quadrant, depression, had recent echo showed cardiomegaly and left ventricular thrombus for which he was a started on Eliquis, he had a cardiac cath 05/31/2022 at the [facility], with recommendation to follow-up with CVTS for consideration of CABG, he had labs with creatinine increased to 3.3 from baseline 1.6 after heart cath.  He presented to the ED now with complaining of shortness of breath.  Evaluation in the ED he was found to have worsening renal function with a creatinine peaked to 5, elevation of troponin to 32, chest x-ray with chronic interstitial disease.  Cardiology and nephrology has been consulted.     Assessment & Plan:   Principal Problem:   Acute kidney injury (AKI) with acute tubular necrosis (ATN) (HCC) Active Problems:   Coronary atherosclerosis of native coronary artery   Acute on chronic systolic CHF (congestive heart failure) (HCC)   DM2 (diabetes mellitus, type 2) (HCC)   GERD (gastroesophageal reflux disease)   Hyperlipidemia   Sleep apnea  1-AKI on CKD stage IIIa:  Creatinine baseline 1.6. He was seen in the ED 9/5 and left AMA he was noted to have a creatinine of 3.3 Presented with a creatinine of 5.5 Contrast nephropathy Nephrology following.  Received IV lasix 100 mg every 8 hours for 2 doses.  Further diuretics per nephrology  1.2 L urine out put.   Acute hypoxic respiratory failure: -insetting of HF and renal failure.  -Received IV lasix.  -Currently on 5 L Oxygen.   Acute on chronic systolic  heart failure exacerbation ECHO 05/11/2022 ejection fraction 25 to 30% hypokinesis inferior wall left ventricular moderate thrombus Diuretics Per nephrology  Left ventricle mural thrombus On heparin Gtt.  He will need coumadin per cardiology   CAD of native coronary artery "Patient had cardiac cath at [facility] 05/31/22 which revealed multi-vessel disease: 100% front of heart (LAD?) and 80% back of heart (Circ?). He was to see CV surgeon for possible CABG". Cardiology following CVTS has been consulted.   AAA;  Repair 7 years ago Recurrence, pre op evaluation ECHO: Left ventricular thrombus.  He was then referred to heart cath. CVTS has been consulte.   Diabetes type 2:  A1c 6.1 SSI.   Sleep apnea: Continue with CPAP/   Hyperlipidemia Continue with Pravachol/   GERD Continue with PPI  Right arm pain, had IV in that site. Warm compress.  Elevated Uric acid.  Check uric acid was elevated at 9. Started allopurinol.    Estimated body mass index is 23.96 kg/m as calculated from the following:   Height as of 06/07/22: '5\' 10"'$  (1.778 m).   Weight as of 06/07/22: 75.8 kg.   DVT prophylaxis: Heparin gtt Code Status: Full code Family Communication: Care discussed with patient.  Disposition Plan:  Status is: Inpatient Remains inpatient appropriate because: management of HF, renal failure    Consultants:  Nephrology Cardiology   Procedures:  ECHO  Antimicrobials:    Subjective: He is breathing better. He report right arm pain, had IV on that arm.    Objective: Vitals:   06/13/22 0545 06/13/22 0600 06/13/22 0630 06/13/22 0645  BP: 139/76 (!) 157/99 133/81   Pulse: 96 (!) 103 97   Resp: (!) 25 (!) 24 (!) 25   Temp:    98.4 F (36.9 C)  TempSrc:    Oral  SpO2: 96% 92% 97%     Intake/Output Summary (Last 24 hours) at 06/13/2022 0800 Last data filed at 06/13/2022 5956 Gross per 24 hour  Intake 94.86 ml  Output 1375 ml  Net -1280.14 ml   There were no vitals filed for  this visit.  Examination:  General exam: Appears calm and comfortable  Respiratory system: BL crackles.  Cardiovascular system: S1 & S2 heard, RRR.  Gastrointestinal system: Abdomen is nondistended, soft and nontender. No organomegaly or masses felt. Normal bowel sounds heard. Central nervous system: Alert and oriented. . Extremities: Symmetric 5 x 5 power.    Data Reviewed: I have personally reviewed following labs and imaging studies  CBC: Recent Labs  Lab 06/07/22 1733 06/12/22 0326 06/12/22 0624 06/13/22 0200  WBC 9.7 10.7*  --  9.6  NEUTROABS 6.0  --   --   --   HGB 11.9* 10.7* 10.5* 10.1*  HCT 35.3* 32.3* 31.0* 30.3*  MCV 87.8 89.7  --  88.6  PLT 279 255  --  387   Basic Metabolic Panel: Recent Labs  Lab 06/07/22 1733 06/12/22 0326 06/12/22 0624 06/13/22 0200  NA 137 139 138 139  K 3.7 4.1 4.1 3.8  CL 101 101  --  105  CO2 22 22  --  22  GLUCOSE 139* 106*  --  104*  BUN 40* 56*  --  65*  CREATININE 3.34* 5.07*  --  5.60*  CALCIUM 9.0 8.7*  --  8.6*  MG 1.9  --   --   --    GFR: Estimated Creatinine Clearance: 12.3 mL/min (A) (by C-G formula based on SCr of 5.6 mg/dL (H)). Liver Function Tests: No results for input(s): "AST", "ALT", "ALKPHOS", "BILITOT", "PROT", "ALBUMIN" in the last 168 hours. No results for input(s): "LIPASE", "AMYLASE" in the last 168 hours. No results for input(s): "AMMONIA" in the last 168 hours. Coagulation Profile: Recent Labs  Lab 06/12/22 0326  INR 1.3*   Cardiac Enzymes: Recent Labs  Lab 06/07/22 1733  CKTOTAL 155   BNP (last 3 results) No results for input(s): "PROBNP" in the last 8760 hours. HbA1C: Recent Labs    06/13/22 0408  HGBA1C 6.1*   CBG: Recent Labs  Lab 06/12/22 1623 06/12/22 2147  GLUCAP 194* 120*   Lipid Profile: No results for input(s): "CHOL", "HDL", "LDLCALC", "TRIG", "CHOLHDL", "LDLDIRECT" in the last 72 hours. Thyroid Function Tests: No results for input(s): "TSH", "T4TOTAL", "FREET4",  "T3FREE", "THYROIDAB" in the last 72 hours. Anemia Panel: No results for input(s): "VITAMINB12", "FOLATE", "FERRITIN", "TIBC", "IRON", "RETICCTPCT" in the last 72 hours. Sepsis Labs: No results for input(s): "PROCALCITON", "LATICACIDVEN" in the last 168 hours.  Recent Results (from the past 240 hour(s))  SARS Coronavirus 2 by RT PCR (hospital order, performed in [facility] hospital lab) *cepheid single result test* Anterior Nasal Swab     Status: None   Collection Time: 06/12/22  5:58 AM   Specimen: Anterior Nasal Swab  Result Value Ref Range Status   SARS Coronavirus 2 by RT PCR NEGATIVE NEGATIVE Final    Comment: (NOTE) SARS-CoV-2 target nucleic acids are NOT DETECTED.  The SARS-CoV-2 RNA is generally detectable in upper and lower respiratory specimens during the acute phase of infection. The lowest concentration of SARS-CoV-2 viral  copies this assay can detect is 250 copies / mL. A negative result does not preclude SARS-CoV-2 infection and should not be used as the sole basis for treatment or other patient management decisions.  A negative result may occur with improper specimen collection / handling, submission of specimen other than nasopharyngeal swab, presence of viral mutation(s) within the areas targeted by this assay, and inadequate number of viral copies (<250 copies / mL). A negative result must be combined with clinical observations, patient history, and epidemiological information.  Fact Sheet for Patients:   https://www.patel.info/  Fact Sheet for Healthcare Providers: https://hall.com/  This test is not yet approved or  cleared by the [place] FDA and has been authorized for detection and/or diagnosis of SARS-CoV-2 by FDA under an Emergency Use Authorization (EUA).  This EUA will remain in effect (meaning this test can be used) for the duration of the COVID-19 declaration under Section 564(b)(1) of the Act, 21  U.S.C. section 360bbb-3(b)(1), unless the authorization is terminated or revoked sooner.  Performed at [facility], [place] [address], [place], [place] [address]          Radiology Studies: ECHOCARDIOGRAM COMPLETE  Result Date: 06/12/2022    ECHOCARDIOGRAM REPORT   Patient Name:   Allen Cortez Date of Exam: 06/12/2022 Medical Rec #:  [id number]        Height:       70.0 in Accession #:    [id number]       Weight:       167.0 lb Date of Birth:  08/09/1951       BSA:          1.933 m Patient Age:    50 years         BP:           163/104 mmHg Patient Gender: M                HR:           96 bpm. Exam Location:  Inpatient Procedure: 2D Echo Indications:    dyspnea. Left ventricular clot.  History:        Patient has no prior history of Echocardiogram examinations.                 Risk Factors:Dyslipidemia, Hypertension, Diabetes, Current                 Smoker and Sleep Apnea.  Sonographer:    Johny Chess RDCS Referring Phys: [id number] Phil Campbell  1. 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 Left ventricular ejection fraction, by estimation, is 30-35%. The left ventricle is abnormal. The left ventricle has focal regional wall motion abnormalities. The left ventricular internal cavity size was mildly dilated. There is akinesis of the left ventricular, apical septal wall, inferior wall, anterior wall and lateral wall. There is akinesis of the left ventricular, entire apical segment. There is severe hypokinesis of the left ventricular, basal-mid anterior wall. A falst tendon is present in the mid LV cavity.  2. Right ventricular systolic function is normal. The right ventricular size is normal. There is moderately elevated pulmonary artery systolic pressure. The estimated right ventricular systolic pressure is 79.0 mmHg.  3. The mitral valve is normal in structure. Mild mitral  valve regurgitation. No evidence of mitral stenosis.  4. The aortic valve is tricuspid. There is  moderate calcification of the aortic valve. Aortic valve regurgitation is not visualized. Aortic valve sclerosis/calcification is present, without any evidence of aortic stenosis.  5. The inferior vena cava is dilated in size with >50% respiratory variability, suggesting right atrial pressure of 8 mmHg. 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 Left ventricular ejection fraction, by estimation, is 30 to 35%. The left ventricle has moderately decreased function. The left ventricle demonstrates regional wall motion abnormalities. Definity contrast agent was given IV to delineate the left ventricular endocardial borders. The left ventricular internal cavity size was mildly dilated. There is no left ventricular hypertrophy. Left ventricular diastolic parameters are indeterminate. Elevated left ventricular end-diastolic pressure. Right Ventricle: The right ventricular size is normal. No increase in right ventricular wall thickness. Right ventricular systolic function is normal. There is moderately elevated pulmonary artery systolic pressure. The tricuspid regurgitant velocity is 3.06 m/s, and with an assumed right atrial pressure of 8 mmHg, the estimated right ventricular systolic pressure is 76.1 mmHg. Left Atrium: Left atrial size was normal in size. Right Atrium: Right atrial size was normal in size. Pericardium: There is no evidence of pericardial effusion. Mitral Valve: The mitral valve is normal in structure. Mild mitral annular calcification. Mild mitral valve regurgitation. No evidence of mitral valve stenosis. Tricuspid Valve: The tricuspid valve is normal in structure. Tricuspid valve regurgitation is mild . No evidence of tricuspid stenosis. Aortic Valve: The aortic valve is tricuspid. There  is moderate calcification of the aortic valve. Aortic valve regurgitation is not visualized. Aortic valve sclerosis/calcification is present, without any evidence of aortic stenosis. Pulmonic Valve: The pulmonic valve was normal in structure. Pulmonic valve regurgitation is not visualized. No evidence of pulmonic stenosis. Aorta: The aortic root is normal in size and structure. Venous: The inferior vena cava is dilated in size with greater than 50% respiratory variability, suggesting right atrial pressure of 8 mmHg. IAS/Shunts: No atrial level shunt detected by color flow Doppler.  LEFT VENTRICLE PLAX 2D LVIDd:         5.90 cm   Diastology LVIDs:         4.80 cm   LV e' medial:    4.35 cm/s LV PW:         0.90 cm   LV E/e' medial:  21.0 LV IVS:        0.90 cm   LV e' lateral:   6.20 cm/s LVOT diam:     2.30 cm   LV E/e' lateral: 14.8 LV SV:         59 LV SV Index:   31 LVOT Area:     4.15 cm  RIGHT VENTRICLE             IVC RV S prime:     16.10 cm/s  IVC diam: 2.20 cm TAPSE (M-mode): 1.8 cm LEFT ATRIUM             Index        RIGHT ATRIUM           Index LA diam:        4.20 cm 2.17 cm/m   RA Area:     11.40 cm LA Vol (A2C):   40.5 ml 20.95 ml/m  RA Volume:   24.30 ml  12.57 ml/m LA Vol (A4C):   80.0 ml 41.38 ml/m LA Biplane Vol: 62.6 ml 32.38 ml/m  AORTIC VALVE LVOT Vmax:   84.80 cm/s LVOT Vmean:  56.100 cm/s LVOT VTI:    0.142 m  AORTA Ao Root diam: 3.20 cm Ao Asc diam:  3.20 cm MITRAL VALVE               TRICUSPID VALVE MV Area (PHT): 5.88 cm    TR Peak grad:   37.5 mmHg MV Decel Time: 129 msec    TR Vmax:        306.00 cm/s MV E velocity: 91.50 cm/s MV A velocity: 96.40 cm/s  SHUNTS MV E/A ratio:  0.95        Systemic VTI:  0.14 m                            Systemic Diam: 2.30 cm Fransico Him MD Electronically signed by Fransico Him MD Signature Date/Time: 06/12/2022/5:48:41 PM    Final    US RENAL  Result Date: 06/12/2022 CLINICAL DATA:  Acute renal failure. Chronic kidney disease, stage IV.  Partial left nephrectomy. EXAM: RENAL / URINARY TRACT ULTRASOUND COMPLETE COMPARISON:  MRI of the abdomen without and with contrast 09/12/2010. FINDINGS: Right Kidney: Renal measurements: 11.0 x 5.0 x 5.6 cm = volume: 162 mL. Renal parenchyma is isoechoic to the index organ, the liver. 2 simple cysts are present near the upper pole measuring 2.4 and 2.0 cm respectively. Left Kidney: Renal measurements: 9.5 x 5.5 x 4.7 cm = volume: 129 mL. Renal parenchyma is isoechoic to the index organ, the spleen. A simple cyst is present at the upper pole measuring up 2.2 cm and at the lower pole measuring up 1.8 cm. No stone or solid lesion is present. No obstruction is present. Bladder: Appears normal for degree of bladder distention. Other: None. IMPRESSION: 1. No acute abnormality. 2. Increased echogenicity of the renal parenchyma bilaterally. This is nonspecific, but commonly seen in the setting of medical renal disease. 3. Simple cysts of both kidneys.  Recommend no follow-up. Electronically Signed   By: San Morelle M.D.   On: 06/12/2022 14:32   DG Chest 2 View  Result Date: 06/12/2022 CLINICAL DATA:  Shortness of breath EXAM: CHEST - 2 VIEW COMPARISON:  CT chest dated 04/29/2022 FINDINGS: Subpleural reticulation/fibrosis in the lungs bilaterally, similar to prior CT chest, favoring chronic interstitial lung disease. No superimposed opacity suspicious for pneumonia. No pleural effusion or pneumothorax. The heart top-normal in size. Visualized osseous structures are within normal limits. IMPRESSION: Stable chronic interstitial lung disease. No evidence of acute cardiopulmonary disease. Electronically Signed   By: Julian Hy M.D.   On: 06/12/2022 04:00        Scheduled Meds:  amLODipine  10 mg Oral Daily   aspirin EC  81 mg Oral Daily   citalopram  40 mg Oral QPM   clonazePAM  0.5 mg Oral Daily   gabapentin  300 mg Oral QHS   hydrALAZINE  10 mg Oral Q8H   insulin aspart  0-15 Units  Subcutaneous TID WC   isosorbide mononitrate  15 mg Oral Daily   pantoprazole  40 mg Oral Daily   pravastatin  40 mg Oral q1800   QUEtiapine  300 mg Oral QHS   sodium chloride flush  3 mL Intravenous Q12H   Continuous Infusions:  sodium chloride     heparin 1,350 Units/hr (06/13/22 0342)     LOS: 1 day    Time spent: 35 Minutes.     Tiffany Talarico  Desiree Lucy, MD [facility]   If 7PM-7AM, please contact night-coverage [email or web address]  06/13/2022, 8:00 AM

## 2022-06-13 NOTE — ED Notes (Signed)
Breakfast order placed ?

## 2022-06-13 NOTE — Progress Notes (Signed)
ANTICOAGULATION CONSULT NOTE - Follow-up Note  Pharmacy Consult for Heparin Indication: chest pain/ACS & LV thrombus  Allergies  Allergen Reactions   Dilaudid [Hydromorphone Hcl] Other (See Comments)    hallucinations   Mirapex [Pramipexole Dihydrochloride] Other (See Comments)    Leg pain   Prednisone Other (See Comments)    irritability    Patient Measurements: Height: '5\' 10"'$  (177.8 cm) Weight: 74.6 kg (164 lb 7.4 oz) IBW/kg (Calculated) : 73 Heparin Dosing Weight: 75.8kg  Vital Signs: Temp: 98.1 F (36.7 C) (09/11 1442) Temp Source: Oral (09/11 1442) BP: 138/98 (09/11 1315) Pulse Rate: 95 (09/11 1315)  Labs: Recent Labs    06/12/22 0326 06/12/22 0530 06/12/22 0624 06/12/22 1630 06/13/22 0200 06/13/22 0408 06/13/22 1446  HGB 10.7*  --  10.5*  --  10.1*  --   --   HCT 32.3*  --  31.0*  --  30.3*  --   --   PLT 255  --   --   --  249  --   --   APTT  --   --   --  44* 57*  --  74*  LABPROT 15.6*  --   --   --   --   --   --   INR 1.3*  --   --   --   --   --   --   HEPARINUNFRC  --   --   --  >1.10* >1.10* >1.10*  --   CREATININE 5.07*  --   --   --  5.60*  --   --   TROPONINIHS 232* 219*  --   --   --   --   --      Estimated Creatinine Clearance: 12.3 mL/min (A) (by C-G formula based on SCr of 5.6 mg/dL (H)).   Medical History: Past Medical History:  Diagnosis Date   AAA (abdominal aortic aneurysm) (HCC)    Abdominal wall mass of left lower quadrant    Basal cell carcinoma    CAD (coronary artery disease) 2011   moderate   Colon polyp    DDD (degenerative disc disease)    Depression    Diabetes mellitus type II    no meds   GERD (gastroesophageal reflux disease)    Hyperlipidemia    Neuromuscular disorder (HCC)    Osteopenia    PAD (peripheral artery disease) (HCC)    Renal cell carcinoma    Restless leg syndrome    Sleep apnea    used a cpap yr ago-does not snore or use it now.    Medications:  Medications Prior to Admission   Medication Sig Dispense Refill Last Dose   amLODipine (NORVASC) 10 MG tablet Take 10 mg by mouth every evening.   06/11/2022   apixaban (ELIQUIS) 5 MG TABS tablet Take 5 mg by mouth 2 (two) times daily.   06/11/2022 at 2000   atorvastatin (LIPITOR) 40 MG tablet Take 40 mg by mouth at bedtime.   06/11/2022   citalopram (CELEXA) 40 MG tablet Take 40 mg by mouth every evening.   06/11/2022   clonazePAM (KLONOPIN) 0.5 MG tablet Take 0.5 mg by mouth at bedtime.   06/11/2022   gabapentin (NEURONTIN) 300 MG capsule Take 300 mg by mouth 3 (three) times daily.   06/11/2022   lansoprazole (PREVACID) 15 MG capsule Take 15 mg by mouth every morning.   06/11/2022   nicotine (NICODERM CQ - DOSED IN MG/24 HOURS) 21 mg/24hr patch  Place 21 mg onto the skin daily.   06/12/2022   QUEtiapine (SEROQUEL) 300 MG tablet Take 300 mg by mouth at bedtime.   06/11/2022    Scheduled:   allopurinol  100 mg Oral Daily   amLODipine  10 mg Oral Daily   aspirin EC  81 mg Oral Daily   carvedilol  3.125 mg Oral BID WC   citalopram  40 mg Oral QPM   clonazePAM  0.5 mg Oral Daily   hydrALAZINE  10 mg Oral Q8H   insulin aspart  0-15 Units Subcutaneous TID WC   isosorbide mononitrate  15 mg Oral Daily   pantoprazole  40 mg Oral Daily   pravastatin  40 mg Oral q1800   QUEtiapine  300 mg Oral QHS   sodium chloride flush  3 mL Intravenous Q12H   Infusions:   sodium chloride     heparin 1,350 Units/hr (06/13/22 0342)   PRN: sodium chloride, acetaminophen, ondansetron (ZOFRAN) IV, sodium chloride flush  Assessment: 56 yom with a 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 Patient presented on 06/12/22 with SOB. Heparin per pharmacy consult placed for chest pain/ACS and LV thrombus .  Patient was on apixaban prior to arrival. Last dose 9/9 at 2000. Utilizing aPTT monitoring due to likely falsely high anti-Xa level secondary to DOAC  use.  Heparin level 9/11 AM  falsely elevated from recent DOAC use.   Update:   8h aPTT = 74 sec, therapeutic after heparin increased to 1350 units/hr.   No bleeding reported.   Hgb 10.1 stable, pltc wnl /stable.  Goal of Therapy:  Heparin level 0.3-0.7 units/ml aPTT 66-102 seconds Monitor platelets by anticoagulation protocol: Yes   Plan:  Continue IV heparin 1350 units/h Recheck aPTT at 22:30 tonight to confirm remains in therapeutic range. Daily PTT, HL and CBC Monitor for s/sx of bleeding  Nicole Cella, RPh Clinical Pharmacist [phone] Please check AMION for all [place] numbers 06/13/2022 4:08 PM

## 2022-06-14 DIAGNOSIS — I5023 Acute on chronic systolic (congestive) heart failure: Secondary | ICD-10-CM | POA: Diagnosis not present

## 2022-06-14 DIAGNOSIS — I25118 Atherosclerotic heart disease of native coronary artery with other forms of angina pectoris: Secondary | ICD-10-CM | POA: Diagnosis not present

## 2022-06-14 DIAGNOSIS — I255 Ischemic cardiomyopathy: Secondary | ICD-10-CM

## 2022-06-14 DIAGNOSIS — I2510 Atherosclerotic heart disease of native coronary artery without angina pectoris: Secondary | ICD-10-CM

## 2022-06-14 DIAGNOSIS — N170 Acute kidney failure with tubular necrosis: Secondary | ICD-10-CM | POA: Diagnosis not present

## 2022-06-14 DIAGNOSIS — N179 Acute kidney failure, unspecified: Secondary | ICD-10-CM

## 2022-06-14 LAB — BASIC METABOLIC PANEL
Anion gap: 12 (ref 5–15)
BUN: 73 mg/dL — ABNORMAL HIGH (ref 8–23)
CO2: 25 mmol/L (ref 22–32)
Calcium: 8.9 mg/dL (ref 8.9–10.3)
Chloride: 105 mmol/L (ref 98–111)
Creatinine, Ser: 5.98 mg/dL — ABNORMAL HIGH (ref 0.61–1.24)
GFR, Estimated: 9 mL/min — ABNORMAL LOW (ref >60)
Glucose, Bld: 108 mg/dL — ABNORMAL HIGH (ref 70–99)
Potassium: 3.7 mmol/L (ref 3.5–5.1)
Sodium: 142 mmol/L (ref 135–145)

## 2022-06-14 LAB — APTT: aPTT: 94 seconds — ABNORMAL HIGH (ref 24–36)

## 2022-06-14 LAB — CBC
HCT: 29.4 % — ABNORMAL LOW (ref 39.0–52.0)
Hemoglobin: 10 g/dL — ABNORMAL LOW (ref 13.0–17.0)
MCH: 29.7 pg (ref 26.0–34.0)
MCHC: 34 g/dL (ref 30.0–36.0)
MCV: 87.2 fL (ref 80.0–100.0)
Platelets: 243 10*3/uL (ref 150–400)
RBC: 3.37 MIL/uL — ABNORMAL LOW (ref 4.22–5.81)
RDW: 15.6 % — ABNORMAL HIGH (ref 11.5–15.5)
WBC: 8.5 10*3/uL (ref 4.0–10.5)
nRBC: 0 % (ref 0.0–0.2)

## 2022-06-14 LAB — GLUCOSE, CAPILLARY
Glucose-Capillary: 114 mg/dL — ABNORMAL HIGH (ref 70–99)
Glucose-Capillary: 106 mg/dL — ABNORMAL HIGH (ref 70–99)
Glucose-Capillary: 94 mg/dL (ref 70–99)
Glucose-Capillary: 112 mg/dL — ABNORMAL HIGH (ref 70–99)

## 2022-06-14 LAB — HEPARIN LEVEL (UNFRACTIONATED): Heparin Unfractionated: >1.1 IU/mL — ABNORMAL HIGH (ref 0.30–0.70)

## 2022-06-14 MED ORDER — HYDRALAZINE HCL 25 MG PO TABS
25.0000 mg | ORAL_TABLET | Freq: Three times a day (TID) | ORAL | Status: DC
  Administered 2022-06-14 – 2022-06-27 (×30): 25 mg via ORAL
  Filled 2022-06-14 (×38): qty 1

## 2022-06-14 MED ORDER — CARVEDILOL 6.25 MG PO TABS
6.2500 mg | ORAL_TABLET | Freq: Two times a day (BID) | ORAL | Status: DC
  Administered 2022-06-14 – 2022-06-27 (×25): 6.25 mg via ORAL
  Filled 2022-06-14 (×26): qty 1

## 2022-06-14 MED ORDER — CITALOPRAM HYDROBROMIDE 20 MG PO TABS
20.0000 mg | ORAL_TABLET | Freq: Every evening | ORAL | Status: DC
  Administered 2022-06-14 – 2022-06-26 (×13): 20 mg via ORAL
  Filled 2022-06-14 (×13): qty 1

## 2022-06-14 MED ORDER — ATORVASTATIN CALCIUM 40 MG PO TABS
40.0000 mg | ORAL_TABLET | Freq: Every day | ORAL | Status: DC
  Administered 2022-06-14 – 2022-06-27 (×14): 40 mg via ORAL
  Filled 2022-06-14 (×14): qty 1

## 2022-06-14 MED ORDER — FUROSEMIDE 10 MG/ML IJ SOLN
60.0000 mg | Freq: Once | INTRAMUSCULAR | Status: AC
  Administered 2022-06-14: 60 mg via INTRAVENOUS
  Filled 2022-06-14: qty 6

## 2022-06-14 MED ORDER — CARVEDILOL 3.125 MG PO TABS
3.1250 mg | ORAL_TABLET | Freq: Once | ORAL | Status: AC
  Administered 2022-06-14: 3.125 mg via ORAL
  Filled 2022-06-14: qty 1

## 2022-06-14 NOTE — Progress Notes (Addendum)
PROGRESS NOTE    Allen Cortez  WGN:[id number] DOB: Nov 05, 1950 DOA: 06/12/2022 PCP: Gerome Sam, MD   Brief Narrative: 71 year old with past medical history significant for mild to moderate nonobstructive CAD on cath 2011, AAA s/p repair 2014 2015, hypertension, hyperlipidemia, diabetes type 2, obstructive sleep apnea, GERD, renal cell carcinoma status post partial left nephrectomy around 2015--2016, CKD stage IIIa, abdominal wall mass left lower quadrant, depression, had recent echo showed cardiomegaly and left ventricular thrombus for which he was a started on Eliquis, he had a cardiac cath 05/31/2022 at the [facility], with recommendation to follow-up with CVTS for consideration of CABG, he had labs with creatinine increased to 3.3 from baseline 1.6 after heart cath.  He presented to the ED now with complaining of shortness of breath.  Evaluation in the ED, he was found to have worsening renal function with a creatinine peaked to 5, elevation of troponin to 32, chest x-ray with chronic interstitial disease.  Cardiology and nephrology has been consulted.     Assessment & Plan:   Principal Problem:   Acute kidney injury (AKI) with acute tubular necrosis (ATN) (HCC) Active Problems:   Coronary atherosclerosis of native coronary artery   Acute on chronic systolic CHF (congestive heart failure) (HCC)   DM2 (diabetes mellitus, type 2) (HCC)   GERD (gastroesophageal reflux disease)   Hyperlipidemia   Sleep apnea  1-AKI on CKD stage IIIa:  Creatinine baseline 1.6. He was seen in the ED 9/5 and left AMA he was noted to have a creatinine of 3.3 Presented with a creatinine of 5.5 Contrast nephropathy Nephrology following.  Received IV lasix 100 mg every 8 hours for 2 doses.  Further diuretics per nephrology  2.1 L urine out put yesterday.  Cr 5.6---5.9 continue to increase.   Acute hypoxic respiratory failure: -insetting of HF and renal failure.  -Received IV lasix.  -He has  been wean off oxygen currently Room air. He required 5 L oxygen.   Acute on chronic systolic heart failure exacerbation ECHO 05/11/2022 ejection fraction 25 to 30% hypokinesis inferior wall left ventricular moderate thrombus Diuretics Per nephrology  Left ventricle mural thrombus On heparin Gtt.  He will need coumadin per cardiology   CAD of native coronary artery "Patient had cardiac cath at [facility] 05/31/22 which revealed multi-vessel disease: 100% front of heart (LAD?) and 80% back of heart (Circ?). He was to see CV surgeon for possible CABG". Cardiology following CVTS has been consulted. Patient will need renal  improvement or start HD for consideration of CABG.   AAA;  Repair 7 years ago Recurrence, pre op evaluation ECHO: Left ventricular thrombus.  He was then referred to heart cath. CVTS has been consulted.   Diabetes type 2:  A1c 6.1 SSI.   Sleep apnea: Continue with CPAP/   Hyperlipidemia Continue with Pravachol/   GERD Continue with PPI  Right arm pain, had IV in that site. Warm compress.  Elevated Uric acid.  uric acid was elevated at 9. Started allopurinol.  Depression; Reduce celexa due to mild prolong QT   Estimated body mass index is 23.34 kg/m as calculated from the following:   Height as of this encounter: '5\' 10"'$  (1.778 m).   Weight as of this encounter: 73.8 kg.   DVT prophylaxis: Heparin gtt Code Status: Full code Family Communication: Care discussed with patient.  Disposition Plan:  Status is: Inpatient Remains inpatient appropriate because: management of HF, renal failure    Consultants:  Nephrology Cardiology   Procedures:  ECHO  Antimicrobials:    Subjective: He is breathing better. Right arm pain improved. He does have some tremors.   Objective: Vitals:   06/14/22 0037 06/14/22 0426 06/14/22 0500 06/14/22 0845  BP: 138/81 129/75  (!) 149/101  Pulse: 81 84    Resp: 20 17    Temp: 98.2 F (36.8 C) 98.3 F (36.8 C)    TempSrc:  Oral Oral    SpO2: 92% 91%    Weight:   73.8 kg   Height:        Intake/Output Summary (Last 24 hours) at 06/14/2022 1305 Last data filed at 06/14/2022 0728 Gross per 24 hour  Intake 508.63 ml  Output 2100 ml  Net -1591.37 ml    Filed Weights   06/13/22 1437 06/14/22 0500  Weight: 74.6 kg 73.8 kg    Examination:  General exam: NAD Respiratory system: Bl crackles, bases.  Cardiovascular system: S 1, S 2 RRR Gastrointestinal system: BS present, soft, nt Central nervous system: Alert, and oriented.  Extremities: no edema    Data Reviewed: I have personally reviewed following labs and imaging studies  CBC: Recent Labs  Lab 06/07/22 1733 06/12/22 0326 06/12/22 0624 06/13/22 0200 06/14/22 0457  WBC 9.7 10.7*  --  9.6 8.5  NEUTROABS 6.0  --   --   --   --   HGB 11.9* 10.7* 10.5* 10.1* 10.0*  HCT 35.3* 32.3* 31.0* 30.3* 29.4*  MCV 87.8 89.7  --  88.6 87.2  PLT 279 255  --  249 382    Basic Metabolic Panel: Recent Labs  Lab 06/07/22 1733 06/12/22 0326 06/12/22 0624 06/13/22 0200 06/14/22 0457  NA 137 139 138 139 142  K 3.7 4.1 4.1 3.8 3.7  CL 101 101  --  105 105  CO2 22 22  --  22 25  GLUCOSE 139* 106*  --  104* 108*  BUN 40* 56*  --  65* 73*  CREATININE 3.34* 5.07*  --  5.60* 5.98*  CALCIUM 9.0 8.7*  --  8.6* 8.9  MG 1.9  --   --   --   --     GFR: Estimated Creatinine Clearance: 11.5 mL/min (A) (by C-G formula based on SCr of 5.98 mg/dL (H)). Liver Function Tests: No results for input(s): "AST", "ALT", "ALKPHOS", "BILITOT", "PROT", "ALBUMIN" in the last 168 hours. No results for input(s): "LIPASE", "AMYLASE" in the last 168 hours. No results for input(s): "AMMONIA" in the last 168 hours. Coagulation Profile: Recent Labs  Lab 06/12/22 0326  INR 1.3*    Cardiac Enzymes: Recent Labs  Lab 06/07/22 1733  CKTOTAL 155    BNP (last 3 results) No results for input(s): "PROBNP" in the last 8760 hours. HbA1C: Recent Labs    06/13/22 0408   HGBA1C 6.1*    CBG: Recent Labs  Lab 06/13/22 1135 06/13/22 1711 06/13/22 2056 06/14/22 0627 06/14/22 1149  GLUCAP 101* 114* 114* 114* 106*    Lipid Profile: No results for input(s): "CHOL", "HDL", "LDLCALC", "TRIG", "CHOLHDL", "LDLDIRECT" in the last 72 hours. Thyroid Function Tests: No results for input(s): "TSH", "T4TOTAL", "FREET4", "T3FREE", "THYROIDAB" in the last 72 hours. Anemia Panel: No results for input(s): "VITAMINB12", "FOLATE", "FERRITIN", "TIBC", "IRON", "RETICCTPCT" in the last 72 hours. Sepsis Labs: No results for input(s): "PROCALCITON", "LATICACIDVEN" in the last 168 hours.  Recent Results (from the past 240 hour(s))  SARS Coronavirus 2 by RT PCR (hospital order, performed in [facility] hospital lab) *cepheid single result test* Anterior  Nasal Swab     Status: None   Collection Time: 06/12/22  5:58 AM   Specimen: Anterior Nasal Swab  Result Value Ref Range Status   SARS Coronavirus 2 by RT PCR NEGATIVE NEGATIVE Final    Comment: (NOTE) SARS-CoV-2 target nucleic acids are NOT DETECTED.  The SARS-CoV-2 RNA is generally detectable in upper and lower respiratory specimens during the acute phase of infection. The lowest concentration of SARS-CoV-2 viral copies this assay can detect is 250 copies / mL. A negative result does not preclude SARS-CoV-2 infection and should not be used as the sole basis for treatment or other patient management decisions.  A negative result may occur with improper specimen collection / handling, submission of specimen other than nasopharyngeal swab, presence of viral mutation(s) within the areas targeted by this assay, and inadequate number of viral copies (<250 copies / mL). A negative result must be combined with clinical observations, patient history, and epidemiological information.  Fact Sheet for Patients:   https://www.patel.info/  Fact Sheet for Healthcare  Providers: https://hall.com/  This test is not yet approved or  cleared by the [place] FDA and has been authorized for detection and/or diagnosis of SARS-CoV-2 by FDA under an Emergency Use Authorization (EUA).  This EUA will remain in effect (meaning this test can be used) for the duration of the COVID-19 declaration under Section 564(b)(1) of the Act, 21 U.S.C. section 360bbb-3(b)(1), unless the authorization is terminated or revoked sooner.  Performed at [facility], [place] [address] [place], [place] [address]          Radiology Studies: ECHOCARDIOGRAM COMPLETE  Result Date: 06/12/2022    ECHOCARDIOGRAM REPORT   Patient Name:   MARQUAVIOUS NAZAR Date of Exam: 06/12/2022 Medical Rec #:  [id number]        Height:       70.0 in Accession #:    [id number]       Weight:       167.0 lb Date of Birth:  09-29-1951       BSA:          1.933 m Patient Age:    38 years         BP:           163/104 mmHg Patient Gender: M                HR:           96 bpm. Exam Location:  Inpatient Procedure: 2D Echo Indications:    dyspnea. Left ventricular clot.  History:        Patient has no prior history of Echocardiogram examinations.                 Risk Factors:Dyslipidemia, Hypertension, Diabetes, Current                 Smoker and Sleep Apnea.  Sonographer:    Johny Chess RDCS Referring Phys: [id number] [place]  1. 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 Left ventricular ejection fraction, by estimation, is 30-35%. The left ventricle is abnormal. The left ventricle has focal regional wall motion abnormalities. The left ventricular internal cavity size was mildly dilated. There is akinesis of the left ventricular, apical septal wall, inferior wall, anterior wall and lateral wall. There is akinesis of the left ventricular, entire apical segment. There  is severe  hypokinesis of the left ventricular, basal-mid anterior wall. A falst tendon is present in the mid LV cavity.  2. Right ventricular systolic function is normal. The right ventricular size is normal. There is moderately elevated pulmonary artery systolic pressure. The estimated right ventricular systolic pressure is 29.9 mmHg.  3. The mitral valve is normal in structure. Mild mitral valve regurgitation. No evidence of mitral stenosis.  4. The aortic valve is tricuspid. There is moderate calcification of the aortic valve. Aortic valve regurgitation is not visualized. Aortic valve sclerosis/calcification is present, without any evidence of aortic stenosis.  5. The inferior vena cava is dilated in size with >50% respiratory variability, suggesting right atrial pressure of 8 mmHg. 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 Left ventricular ejection fraction, by estimation, is 30 to 35%. The left ventricle has moderately decreased function. The left ventricle demonstrates regional wall motion abnormalities. Definity contrast agent was given IV to delineate the left ventricular endocardial borders. The left ventricular internal cavity size was mildly dilated. There is no left ventricular hypertrophy. Left ventricular diastolic parameters are indeterminate. Elevated left ventricular end-diastolic pressure. Right Ventricle: The right ventricular size is normal. No increase in right ventricular wall thickness. Right ventricular systolic function is normal. There is moderately elevated pulmonary artery systolic pressure. The tricuspid regurgitant velocity is 3.06 m/s, and with an assumed right atrial pressure of 8 mmHg, the estimated right ventricular systolic pressure is 37.1 mmHg. Left Atrium: Left atrial size was normal in size. Right Atrium: Right atrial size was normal in size. Pericardium:  There is no evidence of pericardial effusion. Mitral Valve: The mitral valve is normal in structure. Mild mitral annular calcification. Mild mitral valve regurgitation. No evidence of mitral valve stenosis. Tricuspid Valve: The tricuspid valve is normal in structure. Tricuspid valve regurgitation is mild . No evidence of tricuspid stenosis. Aortic Valve: The aortic valve is tricuspid. There is moderate calcification of the aortic valve. Aortic valve regurgitation is not visualized. Aortic valve sclerosis/calcification is present, without any evidence of aortic stenosis. Pulmonic Valve: The pulmonic valve was normal in structure. Pulmonic valve regurgitation is not visualized. No evidence of pulmonic stenosis. Aorta: The aortic root is normal in size and structure. Venous: The inferior vena cava is dilated in size with greater than 50% respiratory variability, suggesting right atrial pressure of 8 mmHg. IAS/Shunts: No atrial level shunt detected by color flow Doppler.  LEFT VENTRICLE PLAX 2D LVIDd:         5.90 cm   Diastology LVIDs:         4.80 cm   LV e' medial:    4.35 cm/s LV PW:         0.90 cm   LV E/e' medial:  21.0 LV IVS:        0.90 cm   LV e' lateral:   6.20 cm/s LVOT diam:     2.30 cm   LV E/e' lateral: 14.8 LV SV:         59 LV SV Index:   31 LVOT Area:     4.15 cm  RIGHT VENTRICLE             IVC RV S prime:     16.10 cm/s  IVC diam: 2.20 cm TAPSE (M-mode): 1.8 cm LEFT ATRIUM             Index  RIGHT ATRIUM           Index LA diam:        4.20 cm 2.17 cm/m   RA Area:     11.40 cm LA Vol (A2C):   40.5 ml 20.95 ml/m  RA Volume:   24.30 ml  12.57 ml/m LA Vol (A4C):   80.0 ml 41.38 ml/m LA Biplane Vol: 62.6 ml 32.38 ml/m  AORTIC VALVE LVOT Vmax:   84.80 cm/s LVOT Vmean:  56.100 cm/s LVOT VTI:    0.142 m  AORTA Ao Root diam: 3.20 cm Ao Asc diam:  3.20 cm MITRAL VALVE               TRICUSPID VALVE MV Area (PHT): 5.88 cm    TR Peak grad:   37.5 mmHg MV Decel Time: 129 msec    TR Vmax:         306.00 cm/s MV E velocity: 91.50 cm/s MV A velocity: 96.40 cm/s  SHUNTS MV E/A ratio:  0.95        Systemic VTI:  0.14 m                            Systemic Diam: 2.30 cm Fransico Him MD Electronically signed by Fransico Him MD Signature Date/Time: 06/12/2022/5:48:41 PM    Final    US RENAL  Result Date: 06/12/2022 CLINICAL DATA:  Acute renal failure. Chronic kidney disease, stage IV. Partial left nephrectomy. EXAM: RENAL / URINARY TRACT ULTRASOUND COMPLETE COMPARISON:  MRI of the abdomen without and with contrast 09/12/2010. FINDINGS: Right Kidney: Renal measurements: 11.0 x 5.0 x 5.6 cm = volume: 162 mL. Renal parenchyma is isoechoic to the index organ, the liver. 2 simple cysts are present near the upper pole measuring 2.4 and 2.0 cm respectively. Left Kidney: Renal measurements: 9.5 x 5.5 x 4.7 cm = volume: 129 mL. Renal parenchyma is isoechoic to the index organ, the spleen. A simple cyst is present at the upper pole measuring up 2.2 cm and at the lower pole measuring up 1.8 cm. No stone or solid lesion is present. No obstruction is present. Bladder: Appears normal for degree of bladder distention. Other: None. IMPRESSION: 1. No acute abnormality. 2. Increased echogenicity of the renal parenchyma bilaterally. This is nonspecific, but commonly seen in the setting of medical renal disease. 3. Simple cysts of both kidneys.  Recommend no follow-up. Electronically Signed   By: San Morelle M.D.   On: 06/12/2022 14:32        Scheduled Meds:  allopurinol  100 mg Oral Daily   amLODipine  10 mg Oral Daily   aspirin EC  81 mg Oral Daily   atorvastatin  40 mg Oral Daily   carvedilol  6.25 mg Oral BID WC   citalopram  40 mg Oral QPM   clonazePAM  0.5 mg Oral Daily   hydrALAZINE  25 mg Oral Q8H   insulin aspart  0-15 Units Subcutaneous TID WC   isosorbide mononitrate  15 mg Oral Daily   pantoprazole  40 mg Oral Daily   QUEtiapine  300 mg Oral QHS   sodium chloride flush  3 mL Intravenous Q12H    Continuous Infusions:  sodium chloride     heparin 1,350 Units/hr (06/14/22 0728)     LOS: 2 days    Time spent: 35 Minutes.     Elmarie Shiley, MD [facility]   If 7PM-7AM, please contact night-coverage  [email or web address]  06/14/2022, 1:05 PM

## 2022-06-14 NOTE — Consult Note (Signed)
[address]       [place] [address]             [phone]        Teejay T Coupe [place] Medical Record #[id number] Date of Birth: 02-14-1951  Referring: No ref. provider found Primary Care: Gerome Sam, MD Primary Cardiologist:None  Chief Complaint:    Chief Complaint  Patient presents with   Shortness of Breath    Emphysema / CAD    History of Present Illness:     71 year old male with history of coronary artery disease was admitted from the emergency department with worsening chest pain.  He was originally scheduled to follow-up with me as an outpatient later this week, but had progression in his symptoms.  On his left heart cath he was noted to have severe two-vessel disease with a CTO lesion to the LAD, and ischemic cardiomyopathy with an EF of 30%.  He presents also with a creatinine of 5.  He does have a history of a abdominal aortic aneurysm repair, diabetes mellitus, renal cell carcinoma status post left partial nephrectomy in 2016.      Past Medical History:  Diagnosis Date   AAA (abdominal aortic aneurysm) (HCC)    Abdominal wall mass of left lower quadrant    Basal cell carcinoma    CAD (coronary artery disease) 2011   moderate   Colon polyp    DDD (degenerative disc disease)    Depression    Diabetes mellitus type II    no meds   GERD (gastroesophageal reflux disease)    Hyperlipidemia    Neuromuscular disorder (HCC)    Osteopenia    PAD (peripheral artery disease) (HCC)    Renal cell carcinoma    Restless leg syndrome    Sleep apnea    used a cpap yr ago-does not snore or use it now.    Past Surgical History:  Procedure Laterality Date   ABDOMINAL AORTIC ANEURYSM REPAIR     HEMORRHOID SURGERY     HERNIA REPAIR  03/06/12   ventral hernia repair   NEPHRECTOMY     left due to renal cell carcenoma   VENTRAL HERNIA REPAIR  03/06/2012   Procedure: HERNIA REPAIR VENTRAL ADULT;  Surgeon: Joyice Faster. Cornett, MD;   Location: [facility];  Service: General;  Laterality: N/A;  excision of stitch granuloma     Social History   Tobacco Use  Smoking Status Every Day   Packs/day: 1.00   Types: Cigarettes  Smokeless Tobacco Never    Social History   Substance and Sexual Activity  Alcohol Use Yes   Comment: glass of wine at night     Allergies  Allergen Reactions   Dilaudid [Hydromorphone Hcl] Other (See Comments)    hallucinations   Mirapex [Pramipexole Dihydrochloride] Other (See Comments)    Leg pain   Prednisone Other (See Comments)    irritability      Current Facility-Administered Medications  Medication Dose Route Frequency Provider Last Rate Last Admin   0.9 %  sodium chloride infusion  250 mL Intravenous PRN Norins, Heinz Knuckles, MD       acetaminophen (TYLENOL) tablet 650 mg  650 mg Oral Q4H PRN Norins, Heinz Knuckles, MD       allopurinol (ZYLOPRIM) tablet 100 mg  100 mg Oral Daily Regalado, Belkys A, MD   100 mg at 06/13/22 1451   amLODipine (NORVASC) tablet 10 mg  10 mg  Oral Daily Sueanne Margarita, MD   10 mg at 06/13/22 1009   aspirin EC tablet 81 mg  81 mg Oral Daily Sueanne Margarita, MD   81 mg at 06/13/22 1010   carvedilol (COREG) tablet 3.125 mg  3.125 mg Oral BID WC Burnell Blanks, MD   3.125 mg at 06/13/22 1814   citalopram (CELEXA) tablet 40 mg  40 mg Oral QPM Norins, Heinz Knuckles, MD   40 mg at 06/13/22 1814   clonazePAM (KLONOPIN) tablet 0.5 mg  0.5 mg Oral Daily Norins, Heinz Knuckles, MD   0.5 mg at 06/13/22 1010   heparin ADULT infusion 100 units/mL (25000 units/258m)  1,350 Units/hr Intravenous Continuous BEinar Grad RPH 13.5 mL/hr at 06/14/22 0728 1,350 Units/hr at 06/14/22 0728   hydrALAZINE (APRESOLINE) tablet 10 mg  10 mg Oral Q8H TFransico HimR, MD   10 mg at 06/14/22 0511   insulin aspart (novoLOG) injection 0-15 Units  0-15 Units Subcutaneous TID WC Norins, MHeinz Knuckles MD   3 Units at 06/12/22 1629   isosorbide mononitrate (IMDUR) 24 hr tablet  15 mg  15 mg Oral Daily TFransico HimR, MD   15 mg at 06/13/22 1010   ondansetron (ZOFRAN) injection 4 mg  4 mg Intravenous Q6H PRN Norins, MHeinz Knuckles MD       pantoprazole (PROTONIX) EC tablet 40 mg  40 mg Oral Daily Norins, MHeinz Knuckles MD   40 mg at 06/13/22 1010   pravastatin (PRAVACHOL) tablet 40 mg  40 mg Oral q1800 NNeena Rhymes MD   40 mg at 06/13/22 1814   QUEtiapine (SEROQUEL) tablet 300 mg  300 mg Oral QHS Norins, MHeinz Knuckles MD   300 mg at 06/13/22 2108   sodium chloride flush (NS) 0.9 % injection 3 mL  3 mL Intravenous Q12H Norins, MHeinz Knuckles MD   3 mL at 06/13/22 2109   sodium chloride flush (NS) 0.9 % injection 3 mL  3 mL Intravenous PRN Norins, MHeinz Knuckles MD        Medications Prior to Admission  Medication Sig Dispense Refill Last Dose   amLODipine (NORVASC) 10 MG tablet Take 10 mg by mouth every evening.   06/11/2022   apixaban (ELIQUIS) 5 MG TABS tablet Take 5 mg by mouth 2 (two) times daily.   06/11/2022 at 2000   atorvastatin (LIPITOR) 40 MG tablet Take 40 mg by mouth at bedtime.   06/11/2022   citalopram (CELEXA) 40 MG tablet Take 40 mg by mouth every evening.   06/11/2022   clonazePAM (KLONOPIN) 0.5 MG tablet Take 0.5 mg by mouth at bedtime.   06/11/2022   gabapentin (NEURONTIN) 300 MG capsule Take 300 mg by mouth 3 (three) times daily.   06/11/2022   lansoprazole (PREVACID) 15 MG capsule Take 15 mg by mouth every morning.   06/11/2022   nicotine (NICODERM CQ - DOSED IN MG/24 HOURS) 21 mg/24hr patch Place 21 mg onto the skin daily.   06/12/2022   QUEtiapine (SEROQUEL) 300 MG tablet Take 300 mg by mouth at bedtime.   06/11/2022    Family History  Problem Relation Age of Onset   Diabetes Mother    Aneurysm Mother        in brain   Cancer Father        lung   Depression Father    Cancer Sister        lung, breast   Pancreatitis Brother    Heart disease Sister  MI   Depression Sister      Review of Systems:   Review of Systems  Constitutional:  Positive for  malaise/fatigue.  Respiratory:  Positive for shortness of breath.   Cardiovascular:  Positive for chest pain.      Physical Exam: BP 129/75 (BP Location: Left Arm)   Pulse 84   Temp 98.3 F (36.8 C) (Oral)   Resp 17   Ht '5\' 10"'$  (1.778 m)   Wt 73.8 kg   SpO2 91%   BMI 23.34 kg/m  Physical Exam Constitutional:      General: He is not in acute distress.    Appearance: He is well-developed. He is not ill-appearing.  HENT:     Head: Normocephalic and atraumatic.  Cardiovascular:     Rate and Rhythm: Normal rate and regular rhythm.  Pulmonary:     Breath sounds: No decreased breath sounds.  Musculoskeletal:     Cervical back: Normal range of motion.  Skin:    General: Skin is warm and dry.  Neurological:     General: No focal deficit present.     Mental Status: He is alert and oriented to person, place, and time.       Diagnostic Studies & Laboratory data:    Left Heart Catherization: I personally reviewed left heart catheterization from the [place].  He does have a CTO lesion to the LAD with left to left collaterals, and tandem lesions in his circumflex. Echo: The echocardiogram was also reviewed and he has an LV function of 30 to 35%.  There is a previous history of an LV thrombus, but this was not apparent.  He does have akinesis of his anterior wall.  There is no significant valvular disease.  His RV function is normal. EKG: Sinus I have independently reviewed the above radiologic studies and discussed with the patient   Recent Lab Findings: Lab Results  Component Value Date   WBC 8.5 06/14/2022   HGB 10.0 (L) 06/14/2022   HCT 29.4 (L) 06/14/2022   PLT 243 06/14/2022   GLUCOSE 108 (H) 06/14/2022   ALT 10 04/09/2014   AST 16 04/09/2014   NA 142 06/14/2022   K 3.7 06/14/2022   CL 105 06/14/2022   CREATININE 5.98 (H) 06/14/2022   BUN 73 (H) 06/14/2022   CO2 25 06/14/2022   INR 1.3 (H) 06/12/2022   HGBA1C 6.1 (H) 06/13/2022      Assessment / Plan:    71 year old male with two-vessel coronary artery disease, and ischemic cardiomyopathy with an EF of 30 to 35%.  He also presents with an acute kidney injury with a creatinine above 5.  He also has a history of diabetes mellitus, renal cell carcinoma status post left nephrectomy, and is status post abdominal aortic aneurysm repair.  There is a question of viability along his anterior wall, thus the patient will need a cardiac MRI for further assessment of this.  Given his renal function, he is at risk of requiring dialysis.  This was discussed with him in detail.  Continue medical optimization for now. We will continue to follow     I  spent 30 minutes counseling the patient face to face.   Lajuana Matte 06/14/2022 7:54 AM

## 2022-06-14 NOTE — Progress Notes (Addendum)
Rounding Note    Patient Name: Allen Cortez Date of Encounter: 06/14/2022  [place] [facility]: [facility]  Subjective   No chest pain, breathing is stable. Lying comfortably in bed.   Inpatient Medications    Scheduled Meds:  allopurinol  100 mg Oral Daily   amLODipine  10 mg Oral Daily   aspirin EC  81 mg Oral Daily   carvedilol  3.125 mg Oral BID WC   citalopram  40 mg Oral QPM   clonazePAM  0.5 mg Oral Daily   hydrALAZINE  10 mg Oral Q8H   insulin aspart  0-15 Units Subcutaneous TID WC   isosorbide mononitrate  15 mg Oral Daily   pantoprazole  40 mg Oral Daily   pravastatin  40 mg Oral q1800   QUEtiapine  300 mg Oral QHS   sodium chloride flush  3 mL Intravenous Q12H   Continuous Infusions:  sodium chloride     heparin 1,350 Units/hr (06/14/22 0728)   PRN Meds: sodium chloride, acetaminophen, ondansetron (ZOFRAN) IV, sodium chloride flush   Vital Signs    Vitals:   06/14/22 0037 06/14/22 0426 06/14/22 0500 06/14/22 0845  BP: 138/81 129/75  (!) 149/101  Pulse: 81 84    Resp: 20 17    Temp: 98.2 F (36.8 C) 98.3 F (36.8 C)    TempSrc: Oral Oral    SpO2: 92% 91%    Weight:   73.8 kg   Height:        Intake/Output Summary (Last 24 hours) at 06/14/2022 0923 Last data filed at 06/14/2022 0728 Gross per 24 hour  Intake 508.63 ml  Output 2100 ml  Net -1591.37 ml      06/14/2022    5:00 AM 06/13/2022    2:37 PM 06/07/2022    5:21 PM  Last 3 Weights  Weight (lbs) 162 lb 11.2 oz 164 lb 7.4 oz 167 lb  Weight (kg) 73.8 kg 74.6 kg 75.751 kg      Telemetry    Sinus Rhythm - Personally Reviewed  ECG    No new tracing  Physical Exam   GEN: No acute distress.   Neck: No JVD Cardiac: RRR, no murmurs, rubs, or gallops.  Respiratory: Clear to auscultation bilaterally. GI: Soft, nontender, non-distended  MS: No edema; No deformity. Neuro:  Nonfocal  Psych: Normal affect   Labs    High Sensitivity Troponin:   Recent Labs  Lab  06/12/22 0326 06/12/22 0530  TROPONINIHS 232* 219*     Chemistry Recent Labs  Lab 06/07/22 1733 06/12/22 0326 06/12/22 0624 06/13/22 0200 06/14/22 0457  NA 137 139 138 139 142  K 3.7 4.1 4.1 3.8 3.7  CL 101 101  --  105 105  CO2 22 22  --  22 25  GLUCOSE 139* 106*  --  104* 108*  BUN 40* 56*  --  65* 73*  CREATININE 3.34* 5.07*  --  5.60* 5.98*  CALCIUM 9.0 8.7*  --  8.6* 8.9  MG 1.9  --   --   --   --   GFRNONAA 19* 11*  --  10* 9*  ANIONGAP 14 16*  --  12 12    Lipids No results for input(s): "CHOL", "TRIG", "HDL", "LABVLDL", "LDLCALC", "CHOLHDL" in the last 168 hours.  Hematology Recent Labs  Lab 06/12/22 0326 06/12/22 0624 06/13/22 0200 06/14/22 0457  WBC 10.7*  --  9.6 8.5  RBC 3.60*  --  3.42* 3.37*  HGB  10.7* 10.5* 10.1* 10.0*  HCT 32.3* 31.0* 30.3* 29.4*  MCV 89.7  --  88.6 87.2  MCH 29.7  --  29.5 29.7  MCHC 33.1  --  33.3 34.0  RDW 15.5  --  15.5 15.6*  PLT 255  --  249 243   Thyroid No results for input(s): "TSH", "FREET4" in the last 168 hours.  BNPNo results for input(s): "BNP", "PROBNP" in the last 168 hours.  DDimer No results for input(s): "DDIMER" in the last 168 hours.   Radiology    ECHOCARDIOGRAM COMPLETE  Result Date: 06/12/2022    ECHOCARDIOGRAM REPORT   Patient Name:   KEMOND AMORIN Date of Exam: 06/12/2022 Medical Rec #:  [id number]        Height:       70.0 in Accession #:    [id number]       Weight:       167.0 lb Date of Birth:  1950-11-17       BSA:          1.933 m Patient Age:    71 years         BP:           163/104 mmHg Patient Gender: M                HR:           96 bpm. Exam Location:  Inpatient Procedure: 2D Echo Indications:    dyspnea. Left ventricular clot.  History:        Patient has no prior history of Echocardiogram examinations.                 Risk Factors:Dyslipidemia, Hypertension, Diabetes, Current                 Smoker and Sleep Apnea.  Sonographer:    Johny Chess RDCS Referring Phys: [id number] [place]  1. 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 Left ventricular ejection fraction, by estimation, is 30-35%. The left ventricle is abnormal. The left ventricle has focal regional wall motion abnormalities. The left ventricular internal cavity size was mildly dilated. There is akinesis of the left ventricular, apical septal wall, inferior wall, anterior wall and lateral wall. There is akinesis of the left ventricular, entire apical segment. There is severe hypokinesis of the left ventricular, basal-mid anterior wall. A falst tendon is present in the mid LV cavity.  2. Right ventricular systolic function is normal. The right ventricular size is normal. There is moderately elevated pulmonary artery systolic pressure. The estimated right ventricular systolic pressure is 23.7 mmHg.  3. The mitral valve is normal in structure. Mild mitral valve regurgitation. No evidence of mitral stenosis.  4. The aortic valve is tricuspid. There is moderate calcification of the aortic valve. Aortic valve regurgitation is not visualized. Aortic valve sclerosis/calcification is present, without any evidence of aortic stenosis.  5. The inferior vena cava is dilated in size with >50% respiratory variability, suggesting right atrial pressure of 8 mmHg. 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 Left ventricular ejection fraction, by estimation, is 30 to 35%. The left ventricle has moderately decreased function. The left ventricle demonstrates regional wall motion abnormalities. Definity contrast agent was given IV to delineate the  left ventricular endocardial borders. The left ventricular internal cavity size was mildly dilated. There is no left ventricular hypertrophy. Left ventricular diastolic  parameters are indeterminate. Elevated left ventricular end-diastolic pressure. Right Ventricle: The right ventricular size is normal. No increase in right ventricular wall thickness. Right ventricular systolic function is normal. There is moderately elevated pulmonary artery systolic pressure. The tricuspid regurgitant velocity is 3.06 m/s, and with an assumed right atrial pressure of 8 mmHg, the estimated right ventricular systolic pressure is 17.7 mmHg. Left Atrium: Left atrial size was normal in size. Right Atrium: Right atrial size was normal in size. Pericardium: There is no evidence of pericardial effusion. Mitral Valve: The mitral valve is normal in structure. Mild mitral annular calcification. Mild mitral valve regurgitation. No evidence of mitral valve stenosis. Tricuspid Valve: The tricuspid valve is normal in structure. Tricuspid valve regurgitation is mild . No evidence of tricuspid stenosis. Aortic Valve: The aortic valve is tricuspid. There is moderate calcification of the aortic valve. Aortic valve regurgitation is not visualized. Aortic valve sclerosis/calcification is present, without any evidence of aortic stenosis. Pulmonic Valve: The pulmonic valve was normal in structure. Pulmonic valve regurgitation is not visualized. No evidence of pulmonic stenosis. Aorta: The aortic root is normal in size and structure. Venous: The inferior vena cava is dilated in size with greater than 50% respiratory variability, suggesting right atrial pressure of 8 mmHg. IAS/Shunts: No atrial level shunt detected by color flow Doppler.  LEFT VENTRICLE PLAX 2D LVIDd:         5.90 cm   Diastology LVIDs:         4.80 cm   LV e' medial:    4.35 cm/s LV PW:         0.90 cm   LV E/e' medial:  21.0 LV IVS:        0.90 cm   LV e' lateral:   6.20 cm/s LVOT diam:     2.30 cm   LV E/e' lateral: 14.8 LV SV:         59 LV SV Index:   31 LVOT Area:     4.15 cm  RIGHT VENTRICLE             IVC RV S prime:     16.10 cm/s  IVC diam:  2.20 cm TAPSE (M-mode): 1.8 cm LEFT ATRIUM             Index        RIGHT ATRIUM           Index LA diam:        4.20 cm 2.17 cm/m   RA Area:     11.40 cm LA Vol (A2C):   40.5 ml 20.95 ml/m  RA Volume:   24.30 ml  12.57 ml/m LA Vol (A4C):   80.0 ml 41.38 ml/m LA Biplane Vol: 62.6 ml 32.38 ml/m  AORTIC VALVE LVOT Vmax:   84.80 cm/s LVOT Vmean:  56.100 cm/s LVOT VTI:    0.142 m  AORTA Ao Root diam: 3.20 cm Ao Asc diam:  3.20 cm MITRAL VALVE               TRICUSPID VALVE MV Area (PHT): 5.88 cm    TR Peak grad:   37.5 mmHg MV Decel Time: 129 msec    TR Vmax:        306.00 cm/s MV E velocity: 91.50 cm/s MV A velocity: 96.40 cm/s  SHUNTS MV E/A ratio:  0.95  Systemic VTI:  0.14 m                            Systemic Diam: 2.30 cm Fransico Him MD Electronically signed by Fransico Him MD Signature Date/Time: 06/12/2022/5:48:41 PM    Final    US RENAL  Result Date: 06/12/2022 CLINICAL DATA:  Acute renal failure. Chronic kidney disease, stage IV. Partial left nephrectomy. EXAM: RENAL / URINARY TRACT ULTRASOUND COMPLETE COMPARISON:  MRI of the abdomen without and with contrast 09/12/2010. FINDINGS: Right Kidney: Renal measurements: 11.0 x 5.0 x 5.6 cm = volume: 162 mL. Renal parenchyma is isoechoic to the index organ, the liver. 2 simple cysts are present near the upper pole measuring 2.4 and 2.0 cm respectively. Left Kidney: Renal measurements: 9.5 x 5.5 x 4.7 cm = volume: 129 mL. Renal parenchyma is isoechoic to the index organ, the spleen. A simple cyst is present at the upper pole measuring up 2.2 cm and at the lower pole measuring up 1.8 cm. No stone or solid lesion is present. No obstruction is present. Bladder: Appears normal for degree of bladder distention. Other: None. IMPRESSION: 1. No acute abnormality. 2. Increased echogenicity of the renal parenchyma bilaterally. This is nonspecific, but commonly seen in the setting of medical renal disease. 3. Simple cysts of both kidneys.  Recommend no follow-up.  Electronically Signed   By: San Morelle M.D.   On: 06/12/2022 14:32    Cardiac Studies   Echo 06/12/22:   1. 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  Left ventricular ejection fraction, by estimation, is 30-35%. The left  ventricle is abnormal. The left  ventricle has focal regional wall motion abnormalities. The left  ventricular internal cavity size was mildly dilated. There is akinesis of  the left ventricular, apical septal wall, inferior wall, anterior wall and  lateral wall. There is akinesis of the  left ventricular, entire apical segment. There is severe hypokinesis of  the left ventricular, basal-mid anterior wall. A falst tendon is present  in the mid LV cavity.   2. Right ventricular systolic function is normal. The right ventricular  size is normal. There is moderately elevated pulmonary artery systolic  pressure. The estimated right ventricular systolic pressure is 06.3 mmHg.   3. The mitral valve is normal in structure. Mild mitral valve  regurgitation. No evidence of mitral stenosis.   4. The aortic valve is tricuspid. There is moderate calcification of the  aortic valve. Aortic valve regurgitation is not visualized. Aortic valve  sclerosis/calcification is present, without any evidence of aortic  stenosis.   5. The inferior vena cava is dilated in size with >50% respiratory  variability, suggesting right atrial pressure of 8 mmHg.   Patient Profile     71 y.o. male  with a history of CAD, AAA status post repair 7 years ago, hypertension, hyperlipidemia, type 2 diabetes mellitus, OSA, GERD, renal cell CA status post partial left nephrectomy in 2016 with CKD stage IIIa, ischemic cardiomyopathy who is admitted to [facility] with CHF. Pt had undergone workup for AAA and during that workup his echo showed severe LV dysfunction with LV apical thrombus. Cardiac cath at the  [facility] on 05/31/22 with CTO of the LAD and severe mid Circumflex stenosis. He had plans to see CT surgery as an outpatient for CABG. Since his heart cath 3  weeks ago he has been having increasing shortness of breath along with lower extremity pain to the point he can barely walk.  He was seen in the ER on 9 5 and left AMA.  At that point he was in AKI with a creatinine of 3.34 with baseline of 1.5-1.6.  Assessment & Plan    CAD with angina -- severe two vessel CAD (CTO of the mid LAD, severe mid Circumflex stenosis) by cath at the [facility] on 05/31/22. Cath films on CD in the back of our cath lab, IT working to load into system. HsTn 232>>219. TCTS consulted for options, but difficult situation given his rising Cr -- Continue  IV heparin, ASA, statin, Imdur, increase hydralazine '25mg'$  TID and coreg to 6.'25mg'$  BID -- may need cardiac MRI to assess viability of the anterior wall but unable to complete at this time with his worsening AKI  Ischemic cardiomyopathy/Acute systolic CHF: -- Echo 3/26 LVEF=30-35% with clear evidence of LV thrombus. Akinesis of the anterior wall. No significant valve disease.  -- IV lasix management per nephrology -- GDMT is limited in the setting of AKI: coreg, Imdur and hydralazine   LV thrombus --  Diagnosed at the [place] in August 2023 and had been on Eliquis which is now held -- Continue IV heparin while undergoing inpatient evaluation   HTN:  -- remains elevated -- increase hydralazine to '25mg'$  TID along with coreg to 6.'25mg'$  BID   CKD/Acute renal failure:  -- Scr continues to rise, 3.34 recently at ER visit. Cr 5.07>>5.60>>5.98 -- nephrology following, felt to be related to be contrast nephropathy   AAA s/p repair with recurrence:  -- Workup at [facility] -- needs repeat abd [place]  HLD -- on pravastatin PTA, transition to high dose statin   For questions or updates, please contact [place] Please consult [email or web address] for contact info under        Signed, Reino Bellis, NP  06/14/2022, 9:23 AM    I have personally seen and examined this patient. I agree with the assessment and plan as outlined above.  No chest pain or dyspnea today.  BP stable. Tele reviewed with sinus Renal function worsening today. Nephrology following.  We have asked CT surgery to see him to discuss CABG that would have to be performed after his renal function improves or after he is started on HD.  No changes today.   Lauree Chandler, MD, [facility] [place] 06/14/2022 10:06 AM

## 2022-06-14 NOTE — Plan of Care (Signed)

## 2022-06-14 NOTE — Progress Notes (Signed)
[facility] Progress Note   Subjective:   Seen in room, sister bedside.  No further hallucinations and tremors have improved much compared to yesterday after dc gabapentin.  No LUTs, good UOP.  No new symptoms.    I/Os yesterday  340 / 2100 all UOP BUN/Cr 73 / 6 from 30 / 5.6 yest. Lytes and bicarb normal  Objective Vitals:   06/14/22 0037 06/14/22 0426 06/14/22 0500 06/14/22 0845  BP: 138/81 129/75  (!) 149/101  Pulse: 81 84    Resp: 20 17    Temp: 98.2 F (36.8 C) 98.3 F (36.8 C)    TempSrc: Oral Oral    SpO2: 92% 91%    Weight:   73.8 kg   Height:       Physical Exam General: chronically ill man in bed who looks comfortable Heart: Reg S1S2, no rub Lungs: a few basilar rales that clear with deep breaths Abdomen: soft Extremities: no edema Neuro:  no myoclonic jerks, conversant and oriented. No asterixis but some tremor still noted  Additional Objective Labs: Basic Metabolic Panel: Recent Labs  Lab 06/12/22 0326 06/12/22 0624 06/13/22 0200 06/14/22 0457  NA 139 138 139 142  K 4.1 4.1 3.8 3.7  CL 101  --  105 105  CO2 22  --  22 25  GLUCOSE 106*  --  104* 108*  BUN 56*  --  65* 73*  CREATININE 5.07*  --  5.60* 5.98*  CALCIUM 8.7*  --  8.6* 8.9    Liver Function Tests: No results for input(s): "AST", "ALT", "ALKPHOS", "BILITOT", "PROT", "ALBUMIN" in the last 168 hours. No results for input(s): "LIPASE", "AMYLASE" in the last 168 hours. CBC: Recent Labs  Lab 06/07/22 1733 06/12/22 0326 06/12/22 0624 06/13/22 0200 06/14/22 0457  WBC 9.7 10.7*  --  9.6 8.5  NEUTROABS 6.0  --   --   --   --   HGB 11.9* 10.7* 10.5* 10.1* 10.0*  HCT 35.3* 32.3* 31.0* 30.3* 29.4*  MCV 87.8 89.7  --  88.6 87.2  PLT 279 255  --  249 243    Blood Culture No results found for: "SDES", "SPECREQUEST", "CULT", "REPTSTATUS"  Cardiac Enzymes: Recent Labs  Lab 06/07/22 1733  CKTOTAL 155    CBG: Recent Labs  Lab 06/13/22 0755 06/13/22 1135  06/13/22 1711 06/13/22 2056 06/14/22 0627  GLUCAP 109* 101* 114* 114* 114*    Iron Studies: No results for input(s): "IRON", "TIBC", "TRANSFERRIN", "FERRITIN" in the last 72 hours. '@lablastinr3'$ @ Studies/Results: ECHOCARDIOGRAM COMPLETE  Result Date: 06/12/2022    ECHOCARDIOGRAM REPORT   Patient Name:   Allen Cortez Date of Exam: 06/12/2022 Medical Rec #:  [id number]        Height:       70.0 in Accession #:    [id number]       Weight:       167.0 lb Date of Birth:  September 03, 1951       BSA:          1.933 m Patient Age:    15 years         BP:           163/104 mmHg Patient Gender: M                HR:           96 bpm. Exam Location:  Inpatient Procedure: 2D Echo Indications:    dyspnea. Left ventricular clot.  History:  Patient has no prior history of Echocardiogram examinations.                 Risk Factors:Dyslipidemia, Hypertension, Diabetes, Current                 Smoker and Sleep Apnea.  Sonographer:    Johny Chess RDCS Referring Phys: [id number] [place]  1. 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 Left ventricular ejection fraction, by estimation, is 30-35%. The left ventricle is abnormal. The left ventricle has focal regional wall motion abnormalities. The left ventricular internal cavity size was mildly dilated. There is akinesis of the left ventricular, apical septal wall, inferior wall, anterior wall and lateral wall. There is akinesis of the left ventricular, entire apical segment. There is severe hypokinesis of the left ventricular, basal-mid anterior wall. A falst tendon is present in the mid LV cavity.  2. Right ventricular systolic function is normal. The right ventricular size is normal. There is moderately elevated pulmonary artery systolic pressure. The estimated right ventricular systolic pressure is 70.2 mmHg.  3. The mitral valve is normal in structure.  Mild mitral valve regurgitation. No evidence of mitral stenosis.  4. The aortic valve is tricuspid. There is moderate calcification of the aortic valve. Aortic valve regurgitation is not visualized. Aortic valve sclerosis/calcification is present, without any evidence of aortic stenosis.  5. The inferior vena cava is dilated in size with >50% respiratory variability, suggesting right atrial pressure of 8 mmHg. 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 Left ventricular ejection fraction, by estimation, is 30 to 35%. The left ventricle has moderately decreased function. The left ventricle demonstrates regional wall motion abnormalities. Definity contrast agent was given IV to delineate the left ventricular endocardial borders. The left ventricular internal cavity size was mildly dilated. There is no left ventricular hypertrophy. Left ventricular diastolic parameters are indeterminate. Elevated left ventricular end-diastolic pressure. Right Ventricle: The right ventricular size is normal. No increase in right ventricular wall thickness. Right ventricular systolic function is normal. There is moderately elevated pulmonary artery systolic pressure. The tricuspid regurgitant velocity is 3.06 m/s, and with an assumed right atrial pressure of 8 mmHg, the estimated right ventricular systolic pressure is 63.7 mmHg. Left Atrium: Left atrial size was normal in size. Right Atrium: Right atrial size was normal in size. Pericardium: There is no evidence of pericardial effusion. Mitral Valve: The mitral valve is normal in structure. Mild mitral annular calcification. Mild mitral valve regurgitation. No evidence of mitral valve stenosis. Tricuspid Valve: The tricuspid valve is normal in structure. Tricuspid valve regurgitation is mild . No evidence of tricuspid stenosis. Aortic Valve: The aortic valve is  tricuspid. There is moderate calcification of the aortic valve. Aortic valve regurgitation is not visualized. Aortic valve sclerosis/calcification is present, without any evidence of aortic stenosis. Pulmonic Valve: The pulmonic valve was normal in structure. Pulmonic valve regurgitation is not visualized. No evidence of pulmonic stenosis. Aorta: The aortic root is normal in size and structure. Venous: The inferior vena cava is dilated in size with greater than 50% respiratory variability, suggesting right atrial pressure of 8 mmHg. IAS/Shunts: No atrial level shunt detected by color flow Doppler.  LEFT VENTRICLE PLAX 2D LVIDd:         5.90 cm  Diastology LVIDs:         4.80 cm   LV e' medial:    4.35 cm/s LV PW:         0.90 cm   LV E/e' medial:  21.0 LV IVS:        0.90 cm   LV e' lateral:   6.20 cm/s LVOT diam:     2.30 cm   LV E/e' lateral: 14.8 LV SV:         59 LV SV Index:   31 LVOT Area:     4.15 cm  RIGHT VENTRICLE             IVC RV S prime:     16.10 cm/s  IVC diam: 2.20 cm TAPSE (M-mode): 1.8 cm LEFT ATRIUM             Index        RIGHT ATRIUM           Index LA diam:        4.20 cm 2.17 cm/m   RA Area:     11.40 cm LA Vol (A2C):   40.5 ml 20.95 ml/m  RA Volume:   24.30 ml  12.57 ml/m LA Vol (A4C):   80.0 ml 41.38 ml/m LA Biplane Vol: 62.6 ml 32.38 ml/m  AORTIC VALVE LVOT Vmax:   84.80 cm/s LVOT Vmean:  56.100 cm/s LVOT VTI:    0.142 m  AORTA Ao Root diam: 3.20 cm Ao Asc diam:  3.20 cm MITRAL VALVE               TRICUSPID VALVE MV Area (PHT): 5.88 cm    TR Peak grad:   37.5 mmHg MV Decel Time: 129 msec    TR Vmax:        306.00 cm/s MV E velocity: 91.50 cm/s MV A velocity: 96.40 cm/s  SHUNTS MV E/A ratio:  0.95        Systemic VTI:  0.14 m                            Systemic Diam: 2.30 cm Fransico Him MD Electronically signed by Fransico Him MD Signature Date/Time: 06/12/2022/5:48:41 PM    Final    US RENAL  Result Date: 06/12/2022 CLINICAL DATA:  Acute renal failure. Chronic kidney disease,  stage IV. Partial left nephrectomy. EXAM: RENAL / URINARY TRACT ULTRASOUND COMPLETE COMPARISON:  MRI of the abdomen without and with contrast 09/12/2010. FINDINGS: Right Kidney: Renal measurements: 11.0 x 5.0 x 5.6 cm = volume: 162 mL. Renal parenchyma is isoechoic to the index organ, the liver. 2 simple cysts are present near the upper pole measuring 2.4 and 2.0 cm respectively. Left Kidney: Renal measurements: 9.5 x 5.5 x 4.7 cm = volume: 129 mL. Renal parenchyma is isoechoic to the index organ, the spleen. A simple cyst is present at the upper pole measuring up 2.2 cm and at the lower pole measuring up 1.8 cm. No stone or solid lesion is present. No obstruction is present. Bladder: Appears normal for degree of bladder distention. Other: None. IMPRESSION: 1. No acute abnormality. 2. Increased echogenicity of the renal parenchyma bilaterally. This is nonspecific, but commonly seen in the setting of medical renal disease. 3. Simple cysts of both kidneys.  Recommend no follow-up. Electronically Signed   By: San Morelle M.D.   On: 06/12/2022 14:32   Medications:  sodium chloride     heparin  1,350 Units/hr (06/14/22 0728)    allopurinol  100 mg Oral Daily   amLODipine  10 mg Oral Daily   aspirin EC  81 mg Oral Daily   atorvastatin  40 mg Oral Daily   carvedilol  3.125 mg Oral Once   carvedilol  6.25 mg Oral BID WC   citalopram  40 mg Oral QPM   clonazePAM  0.5 mg Oral Daily   hydrALAZINE  25 mg Oral Q8H   insulin aspart  0-15 Units Subcutaneous TID WC   isosorbide mononitrate  15 mg Oral Daily   pantoprazole  40 mg Oral Daily   QUEtiapine  300 mg Oral QHS   sodium chloride flush  3 mL Intravenous Q12H   Assessment/ Plan: AKI on CKD 3b - b/l creatinine 1.6 prior to recent heart cath. Creat here 5.0 now 3 wks after heart cath. The labs from 2 days post cath (creat 2.2) and from 9/5 in ED here (creat 3.3) are consistent w/ a typical post-contrast ATN / AKI pattern. UA +small blood (none on  microscopy) and 30 protein; FeNa c/w ATN.  Do not suspect GN. Renal US c/w CKD, simple cysts. No clear uremic symptoms. It's possible will need HD in the next few days, also just as likely that he will recover function w/o needing dialysis. Will follow.   Uncont HTN - getting amlodipine, hydralazine, coreg and imdur ordered and titrated by cardiology today.   CAD - sp LHC on 8/22 at [facility] as above; Cardiology following - think he'll need CABG ultimately.  Pre op testing hindered by AKI for now.  CT surgery consulting.  A/C systolic CHF - echo with EF 30-35%, IV lasix as above has been ordered.  Meds limited by kidney function.  Still with volume - give lasix 60 IV today DM2 OSA Acute hypoxic resp failure - as above, improving Tremor/myoclonic jerks - probably multifactorial with AKI + gabapentin 300 qhs.  Improved with d/c gabapentin  Jannifer Hick MD 06/14/2022, 11:31 AM  [facility] Pager: [phone]

## 2022-06-14 NOTE — Progress Notes (Signed)
   06/14/22 1400  Mobility  Activity Ambulated independently in hallway  Level of Assistance Modified independent, requires aide device or extra time  Assistive Device None  Distance Ambulated (ft) 74 ft  Activity Response Tolerated well  $Mobility charge 1 Mobility   Mobility Specialist Progress Note  During Mobility: 94% SpO2 on RA  Pt was in bed and agreeable. Mobility cut short d/t c/o calf soreness. Returned to bed w/ all needs met and call bell in reach.    Lucious Groves Mobility Specialist

## 2022-06-15 ENCOUNTER — Inpatient Hospital Stay (HOSPITAL_COMMUNITY): Payer: No Typology Code available for payment source

## 2022-06-15 DIAGNOSIS — I5031 Acute diastolic (congestive) heart failure: Secondary | ICD-10-CM

## 2022-06-15 DIAGNOSIS — E785 Hyperlipidemia, unspecified: Secondary | ICD-10-CM

## 2022-06-15 DIAGNOSIS — I5023 Acute on chronic systolic (congestive) heart failure: Secondary | ICD-10-CM | POA: Diagnosis not present

## 2022-06-15 DIAGNOSIS — R471 Dysarthria and anarthria: Secondary | ICD-10-CM | POA: Insufficient documentation

## 2022-06-15 DIAGNOSIS — I25118 Atherosclerotic heart disease of native coronary artery with other forms of angina pectoris: Secondary | ICD-10-CM

## 2022-06-15 DIAGNOSIS — G4730 Sleep apnea, unspecified: Secondary | ICD-10-CM

## 2022-06-15 DIAGNOSIS — I513 Intracardiac thrombosis, not elsewhere classified: Secondary | ICD-10-CM

## 2022-06-15 DIAGNOSIS — N170 Acute kidney failure with tubular necrosis: Secondary | ICD-10-CM | POA: Diagnosis not present

## 2022-06-15 DIAGNOSIS — R4701 Aphasia: Secondary | ICD-10-CM | POA: Diagnosis present

## 2022-06-15 LAB — BASIC METABOLIC PANEL
Anion gap: 16 — ABNORMAL HIGH (ref 5–15)
BUN: 81 mg/dL — ABNORMAL HIGH (ref 8–23)
CO2: 22 mmol/L (ref 22–32)
Calcium: 9.1 mg/dL (ref 8.9–10.3)
Chloride: 102 mmol/L (ref 98–111)
Creatinine, Ser: 6.41 mg/dL — ABNORMAL HIGH (ref 0.61–1.24)
GFR, Estimated: 9 mL/min — ABNORMAL LOW (ref >60)
Glucose, Bld: 99 mg/dL (ref 70–99)
Potassium: 3.8 mmol/L (ref 3.5–5.1)
Sodium: 140 mmol/L (ref 135–145)

## 2022-06-15 LAB — CBC
HCT: 29.9 % — ABNORMAL LOW (ref 39.0–52.0)
Hemoglobin: 10.3 g/dL — ABNORMAL LOW (ref 13.0–17.0)
MCH: 30 pg (ref 26.0–34.0)
MCHC: 34.4 g/dL (ref 30.0–36.0)
MCV: 87.2 fL (ref 80.0–100.0)
Platelets: 243 10*3/uL (ref 150–400)
RBC: 3.43 MIL/uL — ABNORMAL LOW (ref 4.22–5.81)
RDW: 15.5 % (ref 11.5–15.5)
WBC: 9.1 10*3/uL (ref 4.0–10.5)
nRBC: 0 % (ref 0.0–0.2)

## 2022-06-15 LAB — RENAL FUNCTION PANEL
Albumin: 3.1 g/dL — ABNORMAL LOW (ref 3.5–5.0)
Anion gap: 18 — ABNORMAL HIGH (ref 5–15)
BUN: 86 mg/dL — ABNORMAL HIGH (ref 8–23)
CO2: 22 mmol/L (ref 22–32)
Calcium: 9.5 mg/dL (ref 8.9–10.3)
Chloride: 100 mmol/L (ref 98–111)
Creatinine, Ser: 6.64 mg/dL — ABNORMAL HIGH (ref 0.61–1.24)
GFR, Estimated: 8 mL/min — ABNORMAL LOW (ref >60)
Glucose, Bld: 120 mg/dL — ABNORMAL HIGH (ref 70–99)
Phosphorus: 5.4 mg/dL — ABNORMAL HIGH (ref 2.5–4.6)
Potassium: 4 mmol/L (ref 3.5–5.1)
Sodium: 140 mmol/L (ref 135–145)

## 2022-06-15 LAB — LIPID PANEL
Cholesterol: 113 mg/dL (ref 0–200)
HDL: 26 mg/dL — ABNORMAL LOW
LDL Cholesterol: 60 mg/dL (ref 0–99)
Total CHOL/HDL Ratio: 4.3 ratio
Triglycerides: 137 mg/dL
VLDL: 27 mg/dL (ref 0–40)

## 2022-06-15 LAB — GLUCOSE, CAPILLARY
Glucose-Capillary: 102 mg/dL — ABNORMAL HIGH (ref 70–99)
Glucose-Capillary: 127 mg/dL — ABNORMAL HIGH (ref 70–99)
Glucose-Capillary: 146 mg/dL — ABNORMAL HIGH (ref 70–99)
Glucose-Capillary: 135 mg/dL — ABNORMAL HIGH (ref 70–99)
Glucose-Capillary: 92 mg/dL (ref 70–99)

## 2022-06-15 LAB — APTT: aPTT: 65 s — ABNORMAL HIGH (ref 24–36)

## 2022-06-15 LAB — HEPARIN LEVEL (UNFRACTIONATED): Heparin Unfractionated: 0.91 [IU]/mL — ABNORMAL HIGH (ref 0.30–0.70)

## 2022-06-15 MED ORDER — FENTANYL CITRATE PF 50 MCG/ML IJ SOSY
50.0000 ug | PREFILLED_SYRINGE | Freq: Once | INTRAMUSCULAR | Status: AC
  Administered 2022-06-15: 50 ug via INTRAVENOUS
  Filled 2022-06-15: qty 1

## 2022-06-15 MED ORDER — FENTANYL CITRATE PF 50 MCG/ML IJ SOSY
25.0000 ug | PREFILLED_SYRINGE | Freq: Once | INTRAMUSCULAR | Status: AC
  Administered 2022-06-15: 25 ug via INTRAVENOUS
  Filled 2022-06-15: qty 1

## 2022-06-15 NOTE — Consult Note (Addendum)
Neurology Consultation  Reason for Consult: code stroke  Referring Physician: Dr. Teryl Lucy  CC: left side headache and aphasia  History is obtained from:Wife, MD and RN, medical record  HPI: Allen Cortez is a 71 y.o. male with past medical history of AAA, LV thrombus on Eliquis prior to admission, CHF basal cell carcinoma, renal cell carcinoma s/p partial left nephrectomy, CKD, CAD, anxiety and depression, OSA, HTN, HLD, PAD, RLS and DM who presented to the hospital on 9/10 for evaluation of progressive worsening of SOB. Code stroke was called for sudden onset of left side headache with aphasia and dysarthria. Symptoms rapidly improved and were resolved by the time neurology team arrived at bedside. He is on a heparin gtt and was turned off at the time of sudden changes. CT head There is no acute intracranial hemorrhage or evidence of acute infarction. ASPECT score is 10. CBG was checked and was 135. NIHSS 2.  S CR 6.41, BUN 81.  Patient with c/o of continued headache 8/10, denies photosensitivity and left sided.   Will continue to check neurochecks Q2h x 2.   LKW: 1600 IV thrombolysis given?: no, symptoms resolved Premorbid modified Rankin scale (mRS):  0-Completely asymptomatic and back to baseline post-stroke  ROS: Full ROS was performed and is negative except as noted in the HPI.   Past Medical History:  Diagnosis Date   AAA (abdominal aortic aneurysm) (HCC)    Abdominal wall mass of left lower quadrant    Basal cell carcinoma    CAD (coronary artery disease) 2011   moderate   Colon polyp    DDD (degenerative disc disease)    Depression    Diabetes mellitus type II    no meds   GERD (gastroesophageal reflux disease)    Hyperlipidemia    Neuromuscular disorder (HCC)    Osteopenia    PAD (peripheral artery disease) (HCC)    Renal cell carcinoma    Restless leg syndrome    Sleep apnea    used a cpap yr ago-does not snore or use it now.     Family History  Problem  Relation Age of Onset   Diabetes Mother    Aneurysm Mother        in brain   Cancer Father        lung   Depression Father    Cancer Sister        lung, breast   Pancreatitis Brother    Heart disease Sister        MI   Depression Sister      Social History:   reports that he has been smoking cigarettes. He has been smoking an average of 1 pack per day. He has never used smokeless tobacco. He reports current alcohol use. He reports that he does not use drugs.  Medications  Current Facility-Administered Medications:    0.9 %  sodium chloride infusion, 250 mL, Intravenous, PRN, Norins, Heinz Knuckles, MD   acetaminophen (TYLENOL) tablet 650 mg, 650 mg, Oral, Q4H PRN, Norins, Heinz Knuckles, MD, 650 mg at 06/15/22 1427   allopurinol (ZYLOPRIM) tablet 100 mg, 100 mg, Oral, Daily, Regalado, Belkys A, MD, 100 mg at 06/15/22 0846   amLODipine (NORVASC) tablet 10 mg, 10 mg, Oral, Daily, Turner, Traci R, MD, 10 mg at 06/15/22 0846   aspirin EC tablet 81 mg, 81 mg, Oral, Daily, Turner, Traci R, MD, 81 mg at 06/15/22 0846   atorvastatin (LIPITOR) tablet 40 mg, 40 mg, Oral, Daily, Mancel Bale,  Rosalene Billings, NP, 40 mg at 06/15/22 0846   carvedilol (COREG) tablet 6.25 mg, 6.25 mg, Oral, BID WC, Reino Bellis B, NP, 6.25 mg at 06/15/22 0846   citalopram (CELEXA) tablet 20 mg, 20 mg, Oral, QPM, Regalado, Belkys A, MD, 20 mg at 06/14/22 1719   clonazePAM (KLONOPIN) tablet 0.5 mg, 0.5 mg, Oral, Daily, Norins, Heinz Knuckles, MD, 0.5 mg at 06/15/22 0846   heparin ADULT infusion 100 units/mL (25000 units/240m), 1,350 Units/hr, Intravenous, Continuous, BEinar Grad RPH, Held at 06/15/22 1631   hydrALAZINE (APRESOLINE) tablet 25 mg, 25 mg, Oral, Q8H, RReino BellisB, NP, 25 mg at 06/15/22 1427   insulin aspart (novoLOG) injection 0-15 Units, 0-15 Units, Subcutaneous, TID WC, Norins, MHeinz Knuckles MD, 2 Units at 06/15/22 1134   isosorbide mononitrate (IMDUR) 24 hr tablet 15 mg, 15 mg, Oral, Daily, Turner, Traci R,  MD, 15 mg at 06/15/22 0846   ondansetron (ZOFRAN) injection 4 mg, 4 mg, Intravenous, Q6H PRN, Norins, MHeinz Knuckles MD   pantoprazole (PROTONIX) EC tablet 40 mg, 40 mg, Oral, Daily, Norins, MHeinz Knuckles MD, 40 mg at 06/15/22 0846   QUEtiapine (SEROQUEL) tablet 300 mg, 300 mg, Oral, QHS, Norins, MHeinz Knuckles MD, 300 mg at 06/14/22 2129   sodium chloride flush (NS) 0.9 % injection 3 mL, 3 mL, Intravenous, Q12H, Norins, MHeinz Knuckles MD, 3 mL at 06/15/22 0847   sodium chloride flush (NS) 0.9 % injection 3 mL, 3 mL, Intravenous, PRN, Norins, MHeinz Knuckles MD   Exam: Current vital signs: BP 130/80   Pulse 80   Temp 98 F (36.7 C) (Oral)   Resp (!) 21   Ht '5\' 10"'$  (1.778 m)   Wt 73.4 kg   SpO2 95%   BMI 23.23 kg/m  Vital signs in last 24 hours: Temp:  [98 F (36.7 C)-98.4 F (36.9 C)] 98 F (36.7 C) (09/13 1100) Pulse Rate:  [80-85] 80 (09/13 1100) Resp:  [16-24] 21 (09/13 1612) BP: (116-136)/(70-90) 130/80 (09/13 1612) SpO2:  [91 %-95 %] 95 % (09/13 1612) Weight:  [73.4 kg] 73.4 kg (09/13 0415)  GENERAL: Awake, alert in NAD HEENT: - Normocephalic and atraumatic, dry mm LUNGS - Clear to auscultation bilaterally with no wheezes CV - S1S2 RRR, no m/r/g, equal pulses bilaterally. ABDOMEN - Soft, nontender, nondistended with normoactive BS Ext: warm, well perfused, intact peripheral pulses, no edema  NEURO:  Mental Status: AA&Ox3  Language: speech is clear.  Naming, repetition, fluency, and comprehension intact. Cranial Nerves: PERRL EOMI, visual fields full, no facial asymmetry, facial sensation intact, hearing intact, tongue/uvula/soft palate midline, normal sternocleidomastoid and trapezius muscle strength. No evidence of tongue atrophy or fibrillations Motor: 5/5 in all 4 extremities Tone: is normal and bulk is normal Sensation- Intact to light touch bilaterally Coordination: FTN intact bilaterally, no ataxia in BLE. Gait- deferred  NIHSS 1a Level of Conscious.: 0 1b LOC Questions:  0 1c LOC Commands: 0 2 Best Gaze: 0 3 Visual: 0 4 Facial Palsy: 0 5a Motor Arm - left: 0 5b Motor Arm - Right: 0 6a Motor Leg - Left: 0 6b Motor Leg - Right: 0 7 Limb Ataxia: 0 8 Sensory: 0 9 Best Language: 0 10 Dysarthria: 1 11 Extinct. and Inatten.: 0 TOTAL: 1   Labs I have reviewed labs in epic and the results pertinent to this consultation are:  CBC    Component Value Date/Time   WBC 9.1 06/15/2022 0455   RBC 3.43 (L) 06/15/2022 0455   HGB 10.3 (L)  06/15/2022 0455   HCT 29.9 (L) 06/15/2022 0455   PLT 243 06/15/2022 0455   MCV 87.2 06/15/2022 0455   MCH 30.0 06/15/2022 0455   MCHC 34.4 06/15/2022 0455   RDW 15.5 06/15/2022 0455   LYMPHSABS 1.8 06/07/2022 1733   MONOABS 0.6 06/07/2022 1733   EOSABS 1.1 (H) 06/07/2022 1733   BASOSABS 0.1 06/07/2022 1733    CMP     Component Value Date/Time   NA 140 06/15/2022 0455   K 3.8 06/15/2022 0455   CL 102 06/15/2022 0455   CO2 22 06/15/2022 0455   GLUCOSE 99 06/15/2022 0455   BUN 81 (H) 06/15/2022 0455   CREATININE 6.41 (H) 06/15/2022 0455   CALCIUM 9.1 06/15/2022 0455   PROT 7.3 04/09/2014 1537   ALBUMIN 3.9 04/09/2014 1537   AST 16 04/09/2014 1537   ALT 10 04/09/2014 1537   ALKPHOS 80 04/09/2014 1537   BILITOT 0.3 04/09/2014 1537   GFRNONAA 9 (L) 06/15/2022 0455   GFRAA 76 (L) 04/09/2014 1537    Lipid Panel     Component Value Date/Time   CHOL 113 06/15/2022 0455   TRIG 137 06/15/2022 0455   HDL 26 (L) 06/15/2022 0455   CHOLHDL 4.3 06/15/2022 0455   VLDL 27 06/15/2022 0455   LDLCALC 60 06/15/2022 0455     Imaging I have reviewed the images obtained:  CT-head There is no acute intracranial hemorrhage or evidence of acute infarction. ASPECT score is 10.   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 Chronic microvascular ischemic changes  LABS:  LDL-60 A1c 6.1 Echo 9/11: 1. 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 Left ventricular ejection fraction, by estimation, is 30-35%. The left ventricle is abnormal. The left ventricle has focal regional wall motion abnormalities. The left ventricular internal cavity size was mildly dilated. There is akinesis of the left ventricular, apical septal wall, inferior wall, anterior wall and lateral wall. There is akinesis of the left ventricular, entire apical segment. There is severe hypokinesis of the left ventricular, basal-mid anterior wall. A falst tendon is present in the mid LV cavity.   2. Right ventricular systolic function is normal. The right ventricular  size is normal. There is moderately elevated pulmonary artery systolic pressure. The estimated right ventricular systolic pressure is 43.1 mmHg.   3. The mitral valve is normal in structure. Mild mitral valve  regurgitation. No evidence of mitral stenosis.   4. The aortic valve is tricuspid. There is moderate calcification of the aortic valve. Aortic valve regurgitation is not visualized. Aortic valve sclerosis/calcification is present, without any evidence of aortic stenosis.   5. The inferior vena cava is dilated in size with >50% respiratory  variability, suggesting right atrial pressure of 8 mmHg.   Assessment:  Allen Cortez is a 71 y.o. male with past medical history of AAA, LV thrombus on Eliquis prior to admission, basal cell carcinoma, renal cell carcinoma s/p partial left nephrectomy, CKDCAD, anxiety and depression, OSA, HTN, HLD, PAD, RLS and DM who presented to the hospital on 9/10 for evaluation of progressive worsening of SOB He is on heparin drip while undergoing workup and ultimately needs a CABG, however currently with ARF on CKD  Stroke vs TIA vs complicated migraine   Recommendations: MRI/MRA brain- if positive for stroke will pursue full stroke workup. Treat headache-fentanyl per primary   Stroke team will continue to follow  Beulah Gandy DNP, ACNPC-AG  ATTENDING ATTESTATION:  71 year old with past medical history of chronic kidney disease, CHF requiring CABG hold due to his kidney disease.  He is on heparin drip for left ventricular thrombus.  He had abrupt onset headache  with slurred speech and word finding difficulty per family members in the room.  Code stroke was activated patient was taken to CT stat to rule out hemorrhage.  CT was negative.  He is not a candidate for TNK as he is on heparin drip with low NIH stroke scale 1 for dysarthria.  Low suspicion for LVO and unable to get CTA due to kidney disease.  Discussed case with Dr. Lonny Prude and plan to give fentanyl push for his acute headache.  Unable to give Toradol due to kidney disease.  Family states he hallucinates with Dilaudid.  I reevaluated the patient after the head CT and he is back to baseline with a stroke scale 0.  Per family had another brief episode of word finding difficulty however I did not find this on my exam.  Provided reassurance and updated multiple family members.  All questions were answered to their satisfaction.  Dr. Reeves Forth evaluated pt independently, reviewed imaging, chart, labs. Discussed and formulated plan with the Resident/APP. Changes were made to the note where appropriate. Please see APP/resident note above for details.     Jessyka Austria,MD

## 2022-06-15 NOTE — Progress Notes (Signed)
06/15/22 1200  Mobility  Activity Ambulated with assistance in hallway  Level of Assistance Standby assist, set-up cues, supervision of patient - no hands on  Assistive Device Front wheel walker  Distance Ambulated (ft) 170 ft  Activity Response Tolerated well  $Mobility charge 1 Mobility   Mobility Specialist Progress Note  Pt was EOB and agreeable. Mobility cut short d/t c/o calf soreness. Returned EOB w/all needs met and call bell in reach.     Mobility Specialist   

## 2022-06-15 NOTE — Progress Notes (Signed)
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 Baseline creatinine of 1.6. Most recent creatinine of 3.34 5 days prior to admission. Creatinine of 5.07 on admission. Concern for contrast induced nephropathy from recent cardiac catheterization. Nephrology consulted. Patient challenged with Lasix, without improvement of AKI. Not oliguric. -Nephrology recommendations: continue supportive care; nearing threshold to consider HD  CAD Cardiology consulted. Cardiac catheterization performed prior to admission on 8/29 and was significant for severe two vessel disease with recommendation for CABG. Cardiothoracic surgery consulted and are deferring CABG until AKI improved or patient develops evidence of ESRD requiring prolonged HD. -Continue Heparin IV -Continue Imdur  Aphasia/dysarthria Headache Acutely occurred this afternoon. Concern would be hemorrhagic stroke in setting of Heparin IV. NIH of 2 for aphasia and dysarthria. -Code stroke; stat CT head without contrast  Acute respiratory failure with hypoxia In setting of acute heart failure. Resolved.  Acute on chronic systolic heart failure LVEF of 30-35% with akinesis of LV and apical segments, in addition to severe hypokinesis of LV, basal-mid anterior wall. Patient is not on ACEi/ARB therapy secondary to AKI. -Continue Coreg  LV mural thrombus Noted on Transthoracic Echocardiogram.  AAA History of aortic aneurysm repair.  Diabetes mellitus, type 2 -Continue SSI  Sleep apnea -Continue CPAP  Hyperlipidemia -Continue Lipitor  GERD -Continue Protonix  Right arm pain Related to IV site. Resolved.  Elevated uric acid Patient started on allopurinol. No clinical evidence of gout. Also, with worsening renal function, not safe at current dose. -Discontinue  allopurinol -Recheck uric acid as an outpatient  Depression Celexa reduced secondary to prolonged QTc -Continue Celexa 20 mg daily   DVT prophylaxis: Heparin IV (held for code stroke) Code Status:   Code Status: Full Code Family Communication: Wife and son at bedside Disposition Plan: Discharge pending improvement of AKI and ability to receive CABG   Consultants:  Cardiology Nephrology  Procedures:  Transthoracic Echocardiogram   Antimicrobials: None    Subjective: Patient reports no issues overnight this morning.  This afternoon, received nursing page for change in neurologic status of patient, including aphasia and dysarthria.   Objective: BP 130/80   Pulse 80   Temp 98 F (36.7 C) (Oral)   Resp (!) 21   Ht '5\' 10"'$  (1.778 m)   Wt 73.4 kg   SpO2 95%   BMI 23.23 kg/m   Examination:  Initial exam General exam: Appears calm and comfortable Respiratory system: Clear to auscultation. Respiratory effort normal. Cardiovascular system: S1 & S2 heard, RRR.  Gastrointestinal system: Abdomen is nondistended, soft and nontender. Normal bowel sounds heard. Central nervous system: Alert and oriented. No focal neurological deficits. Musculoskeletal: No edema. No calf tenderness Skin: No cyanosis. No rashes Psychiatry: Judgement and insight appear normal. Mood & affect appropriate.   Repeat exam General exam: Appears calm and comfortable Central nervous system: Alert. Aphasia and dysarthria. CN intact. Follows commands. No drift.    Data Reviewed: I have personally reviewed following labs and imaging studies  CBC Lab Results  Component Value Date   WBC 9.1 06/15/2022   RBC 3.43 (L) 06/15/2022   HGB 10.3 (L) 06/15/2022   HCT 29.9 (L) 06/15/2022   MCV 87.2 06/15/2022   MCH 30.0 06/15/2022   PLT 243 06/15/2022   MCHC 34.4 06/15/2022   RDW 15.5 06/15/2022   LYMPHSABS 1.8  06/07/2022   MONOABS 0.6 06/07/2022   EOSABS 1.1 (H) 06/07/2022   BASOSABS 0.1 06/07/2022      Last metabolic panel Lab Results  Component Value Date   NA 140 06/15/2022   K 3.8 06/15/2022   CL 102 06/15/2022   CO2 22 06/15/2022   BUN 81 (H) 06/15/2022   CREATININE 6.41 (H) 06/15/2022   GLUCOSE 99 06/15/2022   GFRNONAA 9 (L) 06/15/2022   GFRAA 76 (L) 04/09/2014   CALCIUM 9.1 06/15/2022   PROT 7.3 04/09/2014   ALBUMIN 3.9 04/09/2014   BILITOT 0.3 04/09/2014   ALKPHOS 80 04/09/2014   AST 16 04/09/2014   ALT 10 04/09/2014   ANIONGAP 16 (H) 06/15/2022    GFR: Estimated Creatinine Clearance: 10.8 mL/min (A) (by C-G formula based on SCr of 6.41 mg/dL (H)).  Recent Results (from the past 240 hour(s))  SARS Coronavirus 2 by RT PCR (hospital order, performed in [facility] hospital lab) *cepheid single result test* Anterior Nasal Swab     Status: None   Collection Time: 06/12/22  5:58 AM   Specimen: Anterior Nasal Swab  Result Value Ref Range Status   SARS Coronavirus 2 by RT PCR NEGATIVE NEGATIVE Final    Comment: (NOTE) SARS-CoV-2 target nucleic acids are NOT DETECTED.  The SARS-CoV-2 RNA is generally detectable in upper and lower respiratory specimens during the acute phase of infection. The lowest concentration of SARS-CoV-2 viral copies this assay can detect is 250 copies / mL. A negative result does not preclude SARS-CoV-2 infection and should not be used as the sole basis for treatment or other patient management decisions.  A negative result may occur with improper specimen collection / handling, submission of specimen other than nasopharyngeal swab, presence of viral mutation(s) within the areas targeted by this assay, and inadequate number of viral copies (<250 copies / mL). A negative result must be combined with clinical observations, patient history, and epidemiological information.  Fact Sheet for Patients:   https://www.patel.info/  Fact Sheet for Healthcare Providers: https://hall.com/  This test is  not yet approved or  cleared by the [place] FDA and has been authorized for detection and/or diagnosis of SARS-CoV-2 by FDA under an Emergency Use Authorization (EUA).  This EUA will remain in effect (meaning this test can be used) for the duration of the COVID-19 declaration under Section 564(b)(1) of the Act, 21 U.S.C. section 360bbb-3(b)(1), unless the authorization is terminated or revoked sooner.  Performed at [facility], [place] [address] [place], [place] [address]       Radiology Studies: No results found.    LOS: 3 days    Cordelia Poche, MD [facility] 06/15/2022, 4:19 PM   If 7PM-7AM, please contact night-coverage [email or web address]

## 2022-06-15 NOTE — Code Documentation (Signed)
Stroke Response Nurse Documentation Code Documentation  Allen Cortez is a 71 y.o. male admitted to [facility]  on 06/12/2022 for Acute Kidney Failure with past medical hx of OSA, CAD, AAA, HTN, hyperlipidemia, DM, renal CA, CKD. On heparin IV. Code stroke was activated by Dr Allen Cortez .   Patient on 3Eunit where he was LKW at 1600 and now complaining of sudden sever HA, left temporal and difficulty word finding.  Stroke team at the bedside after patient activation. Patient to CT with team. NIHSS 0, see documentation for details and code stroke times. Patient with no focal deficits on exam. The following imaging was completed:  CT Head. Patient is not a candidate for IV Thrombolytic due to rapid improvement of symptoms. Patient is not a candidate for IR due to no LVO suspected.   Care/Plan: neuro check q2 x 2.  Treat HA.   Bedside handoff with RN Allen Cortez.    Allen Cortez  Stroke Response RN

## 2022-06-15 NOTE — Progress Notes (Addendum)
Rounding Note    Patient Name: Allen Cortez Date of Encounter: 06/15/2022  [place] Cardiologist: None   Subjective   No anginal symptoms. Feeling quite depressed.   Inpatient Medications    Scheduled Meds:  allopurinol  100 mg Oral Daily   amLODipine  10 mg Oral Daily   aspirin EC  81 mg Oral Daily   atorvastatin  40 mg Oral Daily   carvedilol  6.25 mg Oral BID WC   citalopram  20 mg Oral QPM   clonazePAM  0.5 mg Oral Daily   hydrALAZINE  25 mg Oral Q8H   insulin aspart  0-15 Units Subcutaneous TID WC   isosorbide mononitrate  15 mg Oral Daily   pantoprazole  40 mg Oral Daily   QUEtiapine  300 mg Oral QHS   sodium chloride flush  3 mL Intravenous Q12H   Continuous Infusions:  sodium chloride     heparin 1,350 Units/hr (06/15/22 1013)   PRN Meds: sodium chloride, acetaminophen, ondansetron (ZOFRAN) IV, sodium chloride flush   Vital Signs    Vitals:   06/14/22 1908 06/15/22 0414 06/15/22 0415 06/15/22 0846  BP: 136/75 133/81  (!) 124/90  Pulse: 85 83 85 84  Resp:  16    Temp: 98.1 F (36.7 C) 98.4 F (36.9 C) 98.4 F (36.9 C)   TempSrc: Oral Oral Oral   SpO2: 91% 92%    Weight:   73.4 kg   Height:        Intake/Output Summary (Last 24 hours) at 06/15/2022 1017 Last data filed at 06/15/2022 0831 Gross per 24 hour  Intake 600 ml  Output 1100 ml  Net -500 ml      06/15/2022    4:15 AM 06/14/2022    5:00 AM 06/13/2022    2:37 PM  Last 3 Weights  Weight (lbs) 161 lb 14.4 oz 162 lb 11.2 oz 164 lb 7.4 oz  Weight (kg) 73.437 kg 73.8 kg 74.6 kg      Telemetry    Sinus Rhythm, PVCs - Personally Reviewed  ECG    No new tracing  Physical Exam   GEN: No acute distress.   Neck: No JVD Cardiac: RRR, no murmurs, rubs, or gallops.  Respiratory: Clear to auscultation bilaterally. GI: Soft, nontender, non-distended  MS: No edema; No deformity. Neuro:  Nonfocal  Psych: Normal affect   Labs    High Sensitivity Troponin:   Recent  Labs  Lab 06/12/22 0326 06/12/22 0530  TROPONINIHS 232* 219*     Chemistry Recent Labs  Lab 06/13/22 0200 06/14/22 0457 06/15/22 0455  NA 139 142 140  K 3.8 3.7 3.8  CL 105 105 102  CO2 '22 25 22  '$ GLUCOSE 104* 108* 99  BUN 65* 73* 81*  CREATININE 5.60* 5.98* 6.41*  CALCIUM 8.6* 8.9 9.1  GFRNONAA 10* 9* 9*  ANIONGAP 12 12 16*    Lipids  Recent Labs  Lab 06/15/22 0455  CHOL 113  TRIG 137  HDL 26*  LDLCALC 60  CHOLHDL 4.3    Hematology Recent Labs  Lab 06/13/22 0200 06/14/22 0457 06/15/22 0455  WBC 9.6 8.5 9.1  RBC 3.42* 3.37* 3.43*  HGB 10.1* 10.0* 10.3*  HCT 30.3* 29.4* 29.9*  MCV 88.6 87.2 87.2  MCH 29.5 29.7 30.0  MCHC 33.3 34.0 34.4  RDW 15.5 15.6* 15.5  PLT 249 243 243   Thyroid No results for input(s): "TSH", "FREET4" in the last 168 hours.  BNPNo results for  input(s): "BNP", "PROBNP" in the last 168 hours.  DDimer No results for input(s): "DDIMER" in the last 168 hours.   Radiology    No results found.  Cardiac Studies   Echo 06/12/22:    1. 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  Left ventricular ejection fraction, by estimation, is 30-35%. The left  ventricle is abnormal. The left  ventricle has focal regional wall motion abnormalities. The left  ventricular internal cavity size was mildly dilated. There is akinesis of  the left ventricular, apical septal wall, inferior wall, anterior wall and  lateral wall. There is akinesis of the  left ventricular, entire apical segment. There is severe hypokinesis of  the left ventricular, basal-mid anterior wall. A falst tendon is present  in the mid LV cavity.   2. Right ventricular systolic function is normal. The right ventricular  size is normal. There is moderately elevated pulmonary artery systolic  pressure. The estimated right ventricular systolic pressure is 37.6 mmHg.   3. The mitral  valve is normal in structure. Mild mitral valve  regurgitation. No evidence of mitral stenosis.   4. The aortic valve is tricuspid. There is moderate calcification of the  aortic valve. Aortic valve regurgitation is not visualized. Aortic valve  sclerosis/calcification is present, without any evidence of aortic  stenosis.   5. The inferior vena cava is dilated in size with >50% respiratory  variability, suggesting right atrial pressure of 8 mmHg.   Patient Profile     71 y.o. male  with a history of CAD, AAA status post repair 7 years ago, hypertension, hyperlipidemia, type 2 diabetes mellitus, OSA, GERD, renal cell CA status post partial left nephrectomy in 2016 with CKD stage IIIa, ischemic cardiomyopathy who is admitted to [facility] with CHF. Pt had undergone workup for AAA and during that workup his echo showed severe LV dysfunction with LV apical thrombus. Cardiac cath at the [facility] on 05/31/22 with CTO of the LAD and severe mid Circumflex stenosis. He had plans to see CT surgery as an outpatient for CABG. Since his heart cath 3 weeks ago he has been having increasing shortness of breath along with lower extremity pain to the point he can barely walk.  He was seen in the ER on 9 5 and left AMA.  At that point he was in AKI with a creatinine of 3.34 with baseline of 1.5-1.6  Assessment & Plan    CAD with angina -- severe two vessel CAD (CTO of the mid LAD, severe mid Circumflex stenosis) by cath at the [facility] on 05/31/22. Cath films on CD in the cath lab, IT working to load into system. HsTn 232>>219. TCTS consulted for options, but difficult situation given his rising Cr. Needs cardiac MRI for viability but unable to completed with worsening AKI. Continue medical management for now -- Continue  IV heparin, ASA, statin, Imdur, hydralazine '25mg'$  TID and coreg to 6.'25mg'$  BID   Ischemic cardiomyopathy/Acute systolic CHF: -- Echo 2/83 LVEF=30-35% with clear evidence of LV thrombus. Akinesis of the anterior wall.  No significant valve disease.  -- IV lasix management per nephrology -- GDMT is limited in the setting of AKI: coreg, Imdur and hydralazine   LV thrombus --  Diagnosed at the [place] in August 2023 and had been on Eliquis which is now held -- Continue IV heparin while undergoing inpatient evaluation   HTN:  --  Improved -- continue hydralazine to '25mg'$  TID, coreg to 6.'25mg'$  BID   CKD/Acute renal failure:  -- Scr continues to rise, 3.34 recently at ER visit. Cr 5.07>>5.60>>5.98>>6.4 -- nephrology following, felt to be related to be contrast nephropathy   AAA s/p repair with recurrence:  -- Workup at [facility] -- needs repeat abd [place]   HLD -- LDL 60, HDL 26 -- on pravastatin PTA, transitioned to high dose atorvastatin '40mg'$  daily    DM -- Hgb A1c 6.1 -- SSI while inpatient  For questions or updates, please contact [place] Please consult [email or web address] for contact info under   Signed, Reino Bellis, NP  06/15/2022, 10:17 AM    I have personally seen and examined this patient. I agree with the assessment and plan as outlined above.  Pt with severe two vessel CAD and needs CABG but this cannot be done currently due to his renal failure. Continue current medical therapy. Continue to follow creatinine daily   Lauree Chandler, MD, [facility] 06/15/2022 11:03 AM

## 2022-06-15 NOTE — TOC Progression Note (Signed)
Transition of Care [facility] - Progression Note    Patient Details  Name: Allen Cortez MRN: [id number] Date of Birth: [birth year]  Transition of [facility]) CM/SW Contact  Zenon Mayo, RN Phone Number: 06/15/2022, 4:06 PM  Clinical Narrative:    From home, he goes to [facility] PCP is Burks-Bermudez, SW is Cathlin Neamo, [place].   Presents with AKI, cret is up to 6.4 this am, needs a CABG, but before this can happen kidneys need to be stable. , conts on hep drip. TOC following.         Expected Discharge Plan and Services                                                 Social Determinants of Health (SDOH) Interventions    Readmission Risk Interventions     No data to display

## 2022-06-15 NOTE — Progress Notes (Signed)
[facility] Progress Note   Subjective:   Seen in room.  Dismayed by continued decline in renal function but o/w no complaints.  No dysgeusia, no further hallucinations and tremors improved since d/c gabapentin.   PO intake ok.  No LUTs, good UOP.  No new symptoms.    I/Os yesterday  650 / 1100 all UOP  Objective Vitals:   06/14/22 1908 06/15/22 0414 06/15/22 0415 06/15/22 0846  BP: 136/75 133/81  (!) 124/90  Pulse: 85 83 85 84  Resp:  16    Temp: 98.1 F (36.7 C) 98.4 F (36.9 C) 98.4 F (36.9 C)   TempSrc: Oral Oral Oral   SpO2: 91% 92%    Weight:   73.4 kg   Height:       Physical Exam General: chronically ill man in bed who looks comfortable Heart: Reg S1S2, no rub Lungs: a few basilar rales that clear with deep breaths Abdomen: soft Extremities: no edema Neuro:  no myoclonic jerks, conversant and oriented. No asterixis but some tremor still noted  Additional Objective Labs: Basic Metabolic Panel: Recent Labs  Lab 06/13/22 0200 06/14/22 0457 06/15/22 0455  NA 139 142 140  K 3.8 3.7 3.8  CL 105 105 102  CO2 '22 25 22  '$ GLUCOSE 104* 108* 99  BUN 65* 73* 81*  CREATININE 5.60* 5.98* 6.41*  CALCIUM 8.6* 8.9 9.1    Liver Function Tests: No results for input(s): "AST", "ALT", "ALKPHOS", "BILITOT", "PROT", "ALBUMIN" in the last 168 hours. No results for input(s): "LIPASE", "AMYLASE" in the last 168 hours. CBC: Recent Labs  Lab 06/12/22 0326 06/12/22 0624 06/13/22 0200 06/14/22 0457 06/15/22 0455  WBC 10.7*  --  9.6 8.5 9.1  HGB 10.7*   < > 10.1* 10.0* 10.3*  HCT 32.3*   < > 30.3* 29.4* 29.9*  MCV 89.7  --  88.6 87.2 87.2  PLT 255  --  249 243 243   < > = values in this interval not displayed.    Blood Culture No results found for: "SDES", "SPECREQUEST", "CULT", "REPTSTATUS"  Cardiac Enzymes: No results for input(s): "CKTOTAL", "CKMB", "CKMBINDEX", "TROPONINI" in the last 168 hours.  CBG: Recent Labs  Lab 06/14/22 0627  06/14/22 1149 06/14/22 1702 06/14/22 2118 06/15/22 0601  GLUCAP 114* 106* 94 112* 102*    Iron Studies: No results for input(s): "IRON", "TIBC", "TRANSFERRIN", "FERRITIN" in the last 72 hours. '@lablastinr3'$ @ Studies/Results: No results found. Medications:  sodium chloride     heparin 1,350 Units/hr (06/15/22 1013)    allopurinol  100 mg Oral Daily   amLODipine  10 mg Oral Daily   aspirin EC  81 mg Oral Daily   atorvastatin  40 mg Oral Daily   carvedilol  6.25 mg Oral BID WC   citalopram  20 mg Oral QPM   clonazePAM  0.5 mg Oral Daily   hydrALAZINE  25 mg Oral Q8H   insulin aspart  0-15 Units Subcutaneous TID WC   isosorbide mononitrate  15 mg Oral Daily   pantoprazole  40 mg Oral Daily   QUEtiapine  300 mg Oral QHS   sodium chloride flush  3 mL Intravenous Q12H   Assessment/ Plan: AKI on CKD 3b - b/l creatinine 1.6 prior to recent heart cath. Creat here 5.0 now 3 wks after heart cath. The labs from 2 days post cath (creat 2.2) and from 9/5 in ED here (creat 3.3) are consistent w/ a typical post-contrast ATN / AKI pattern. UA +small  blood (none on microscopy) and 30 protein; FeNa c/w ATN.  Do not suspect GN. Renal US c/w CKD, simple cysts. No clear uremic symptoms. It's possible will need HD in the next few days, also just as likely that he will recover function w/o needing dialysis. Will follow.   Uncont HTN - getting amlodipine, hydralazine, coreg and imdur ordered and titrated by cardiology and BPs now good.  Would avoid hypotension.   CAD - sp [facility] on 8/22 at [facility] as above; Cardiology following - think he'll need CABG ultimately.  Pre op testing hindered by AKI for now.  CT surgery consulting.  A/C systolic CHF - echo with EF 30-35%.  Meds limited by kidney function.  Looks euvolemic today.  DM2 OSA Acute hypoxic resp failure - as above, improving Tremor/myoclonic jerks - probably multifactorial with AKI + gabapentin 300 qhs.  Improved with d/c gabapentin  Jannifer Hick  MD 06/15/2022, 10:28 AM  [facility] Pager: [phone]

## 2022-06-16 ENCOUNTER — Inpatient Hospital Stay (HOSPITAL_COMMUNITY): Payer: No Typology Code available for payment source

## 2022-06-16 DIAGNOSIS — N170 Acute kidney failure with tubular necrosis: Secondary | ICD-10-CM | POA: Diagnosis not present

## 2022-06-16 DIAGNOSIS — R4701 Aphasia: Secondary | ICD-10-CM | POA: Diagnosis not present

## 2022-06-16 DIAGNOSIS — I5023 Acute on chronic systolic (congestive) heart failure: Secondary | ICD-10-CM | POA: Diagnosis not present

## 2022-06-16 DIAGNOSIS — I5031 Acute diastolic (congestive) heart failure: Secondary | ICD-10-CM | POA: Diagnosis not present

## 2022-06-16 DIAGNOSIS — I25118 Atherosclerotic heart disease of native coronary artery with other forms of angina pectoris: Secondary | ICD-10-CM | POA: Diagnosis not present

## 2022-06-16 LAB — CBC
HCT: 30.5 % — ABNORMAL LOW (ref 39.0–52.0)
Hemoglobin: 10.2 g/dL — ABNORMAL LOW (ref 13.0–17.0)
MCH: 29.6 pg (ref 26.0–34.0)
MCHC: 33.4 g/dL (ref 30.0–36.0)
MCV: 88.4 fL (ref 80.0–100.0)
Platelets: 237 10*3/uL (ref 150–400)
RBC: 3.45 MIL/uL — ABNORMAL LOW (ref 4.22–5.81)
RDW: 15.4 % (ref 11.5–15.5)
WBC: 10.5 10*3/uL (ref 4.0–10.5)
nRBC: 0 % (ref 0.0–0.2)

## 2022-06-16 LAB — RENAL FUNCTION PANEL
Albumin: 3 g/dL — ABNORMAL LOW (ref 3.5–5.0)
Anion gap: 15 (ref 5–15)
BUN: 85 mg/dL — ABNORMAL HIGH (ref 8–23)
CO2: 21 mmol/L — ABNORMAL LOW (ref 22–32)
Calcium: 9.1 mg/dL (ref 8.9–10.3)
Chloride: 103 mmol/L (ref 98–111)
Creatinine, Ser: 6.74 mg/dL — ABNORMAL HIGH (ref 0.61–1.24)
GFR, Estimated: 8 mL/min — ABNORMAL LOW (ref >60)
Glucose, Bld: 94 mg/dL (ref 70–99)
Phosphorus: 5.8 mg/dL — ABNORMAL HIGH (ref 2.5–4.6)
Potassium: 3.8 mmol/L (ref 3.5–5.1)
Sodium: 139 mmol/L (ref 135–145)

## 2022-06-16 LAB — GLUCOSE, CAPILLARY
Glucose-Capillary: 108 mg/dL — ABNORMAL HIGH (ref 70–99)
Glucose-Capillary: 118 mg/dL — ABNORMAL HIGH (ref 70–99)
Glucose-Capillary: 164 mg/dL — ABNORMAL HIGH (ref 70–99)
Glucose-Capillary: 96 mg/dL (ref 70–99)

## 2022-06-16 LAB — HEPARIN LEVEL (UNFRACTIONATED): Heparin Unfractionated: 0.72 IU/mL — ABNORMAL HIGH (ref 0.30–0.70)

## 2022-06-16 LAB — APTT: aPTT: 57 seconds — ABNORMAL HIGH (ref 24–36)

## 2022-06-16 NOTE — Hospital Course (Addendum)
Allen Cortez was admitted to the hospital with the working diagnosis of acute decompensation of heart failure. Progressive renal failure now on ERSD.   71 y.o. male with a history of CAD, AAA s/p repair, hypertension, hyperlipidemia, diabetes mellitus type 2, OSA, GERD, renal cell carcinoma s/p partial left nephrectomy, CKD stage IIIa, abdominal wall mass, depression, LV thrombus on Eliquis. Allen Cortez reported worsening dyspnea and lower extremity edema. On his initial physical examination his blood pressure was 164/104, HR 94, RR 35 and 02 saturation 95%, lungs with no wheezing or rales, positive JVD, heart with S1 and S2 present and rhythmic with no gallops, abdomen with no distention and no lower extremity edema.   Na 139, K 4,1 CL 101 bicarbonate 22, glucose 106 bun 56 cr 5,0  High sensitive troponin 232 and 219  Wbc  10,7 hgb 10,7 plt 255   Chest radiograph with bilateral interstitial infiltrates with cephalization of the vasculature.   EKG 90 bpm, left axis deviation, normal intervals, left anterior fascicular block, sinus rhythm with no significant ST segment or T wave changes.   Allen Cortez was placed on furosemide for diuresis.   Creatinine continued to worsen. Supportive care. During hospitalization, Allen Cortez developed transient aphasia and dysarthria with associated headache in setting of heparin IV infusion; code stroke called which revealed evidence of no acute stroke.  09/18 Allen Cortez had a temporal HD cathter placement and started on renal replacement therapy.   09/21 Allen Cortez will need to continue with outpatient HD. He has been started on warfarin for anticoagulation. Outpatient follow up with CT surgery for eventual CABG.  09/22 Pending INR to be therapeutic and outpatient HD.  09/23 plan for Allen Cortez to have cardiac MRI as outpatient (Allen Cortez had IV iron during his hospitalization).

## 2022-06-16 NOTE — Progress Notes (Addendum)
[facility] Progress Note   Subjective:   Patient states he feels well today.  Specifically denies fevers, chills, shortness of breath, chest pain, nausea, vomiting, diarrhea.  Urine output seems to be fairly adequate.  No hematuria.  Creatinine nearly plateauing today.  Urine output about 1 L.  Objective Vitals:   06/15/22 1950 06/16/22 0000 06/16/22 0354 06/16/22 0800  BP: 137/86 139/87 (!) 152/94 (!) 151/87  Pulse: 85 85 85 85  Resp: 17 18 (!) 24 18  Temp: 97.6 F (36.4 C) 97.6 F (36.4 C) 97.6 F (36.4 C)   TempSrc: Oral Oral Oral   SpO2: 93%   96%  Weight:   73.1 kg   Height:       Physical Exam General: In bed in no distress Heart: Normal rate, no rub Lungs: Bilateral chest rise with no increased work of breathing Abdomen: soft, nondistended Extremities: no edema, warm and well perfused Neuro: Minimal tremor, no asterixis, conversant and oriented  Additional Objective Labs: Basic Metabolic Panel: Recent Labs  Lab 06/15/22 0455 06/15/22 1739 06/16/22 0320  NA 140 140 139  K 3.8 4.0 3.8  CL 102 100 103  CO2 22 22 21*  GLUCOSE 99 120* 94  BUN 81* 86* 85*  CREATININE 6.41* 6.64* 6.74*  CALCIUM 9.1 9.5 9.1  PHOS  --  5.4* 5.8*   Liver Function Tests: Recent Labs  Lab 06/15/22 1739 06/16/22 0320  ALBUMIN 3.1* 3.0*   No results for input(s): "LIPASE", "AMYLASE" in the last 168 hours. CBC: Recent Labs  Lab 06/12/22 0326 06/12/22 0624 06/13/22 0200 06/14/22 0457 06/15/22 0455 06/16/22 0320  WBC 10.7*  --  9.6 8.5 9.1 10.5  HGB 10.7*   < > 10.1* 10.0* 10.3* 10.2*  HCT 32.3*   < > 30.3* 29.4* 29.9* 30.5*  MCV 89.7  --  88.6 87.2 87.2 88.4  PLT 255  --  249 243 243 237   < > = values in this interval not displayed.   Blood Culture No results found for: "SDES", "SPECREQUEST", "CULT", "REPTSTATUS"  Cardiac Enzymes: No results for input(s): "CKTOTAL", "CKMB", "CKMBINDEX", "TROPONINI" in the last 168 hours.  CBG: Recent Labs  Lab  06/15/22 1111 06/15/22 1554 06/15/22 1636 06/15/22 2133 06/16/22 0557  GLUCAP 127* 146* 135* 92 108*   Iron Studies: No results for input(s): "IRON", "TIBC", "TRANSFERRIN", "FERRITIN" in the last 72 hours. '@lablastinr3'$ @ Studies/Results: MR ANGIO HEAD WO CONTRAST  Result Date: 06/16/2022 CLINICAL DATA:  71 year old male code stroke presentation yesterday. EXAM: MRA HEAD WITHOUT CONTRAST TECHNIQUE: Angiographic images of the Circle of Willis were acquired using MRA technique without intravenous contrast. COMPARISON:  Brain MRI today. FINDINGS: Anterior circulation: Antegrade flow in both ICA siphons with mild siphon dolichoectasia. Mild siphon irregularity, but no siphon stenosis. Ophthalmic and posterior communicating artery origins are within normal limits. Patent carotid termini, MCA and ACA origins. Anterior communicating artery is normal. Azygous ACA A2 anatomy (normal variant). Visible ACA branches are within normal limits. Bilateral MCA M1 segments and bifurcations are patent without stenosis. Right MCA branches are within normal limits. There is mild irregularity and stenosis of the left MCA superior M2 division near its origin. Other left MCA branches are within normal limits. Posterior circulation: Antegrade flow in the distal left vertebral artery which appears dominant and supplies the basilar. No distal left vertebral stenosis, and the left PICA origin is patent. No flow signal in the distal right vertebral artery, which is likely occluded on series 9, image 24.  Right AICA appears patent and dominant. Patent basilar artery with tortuosity. Bilateral AICA, SCA, and PCA origins are patent. Both posterior communicating arteries are present, the left is larger. Bilateral PCA branches are within normal limits. Anatomic variants: Dominant left vertebral artery. Azygous ACA anatomy. Other: Brain MRI today reported separately. IMPRESSION: 1. 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 Dominant appearing left vertebral artery is patent and supplies the Basilar. 2. Generalized intracranial artery tortuosity with otherwise mild intracranial atherosclerosis (left MCA superior M2 division). Electronically Signed   By: Genevie Ann M.D.   On: 06/16/2022 05:58   MR BRAIN WO CONTRAST  Result Date: 06/16/2022 CLINICAL DATA:  71 year old male code stroke presentation yesterday. EXAM: MRI HEAD WITHOUT CONTRAST TECHNIQUE: Multiplanar, multiecho pulse sequences of the brain and surrounding structures were obtained without intravenous contrast. COMPARISON:  Head CT yesterday. FINDINGS: Brain: No restricted diffusion or evidence of acute infarction. There are scattered small chronic infarcts in both cerebellar hemispheres. Chronic small lacunar infarcts in the bilateral basal ganglia. Small area of cortical encephalomalacia in the posterior left parietal lobe (series 6, image 37). And similar small cortical encephalomalacia along the left superior frontal gyrus series 15, image 22. Mild if any chronic cerebral blood products, suggestion of a chronic subcortical white matter microhemorrhage in the left parietal lobe on series 18, image 34. Brainstem remains normal. No midline shift, mass effect, evidence of mass lesion, ventriculomegaly, extra-axial collection or acute intracranial hemorrhage. Cervicomedullary junction and pituitary are within normal limits. Vascular: Major intracranial vascular flow voids are preserved, the distal left vertebral artery appears dominant and is dolichoectatic. There is other generalized intracranial artery tortuosity. See MRA findings reported separately today. Skull and upper cervical spine: Negative for age visible cervical spine. Visualized bone marrow signal is within normal limits. Sinuses/Orbits: Negative orbits. Paranasal Visualized paranasal sinuses and mastoids are stable and well aerated. Other: Visible internal auditory structures appear normal. Negative  stylomastoid foramina, visible scalp and face soft tissues. IMPRESSION: 1. No acute intracranial abnormality. 2. Chronic small and medium-sized vessel ischemia in the Left MCA territory, bilateral cerebellum, and the bilateral basal ganglia. 3. Generalized intracranial artery tortuosity. MRA is reported separately. Electronically Signed   By: Genevie Ann M.D.   On: 06/16/2022 05:53   CT HEAD CODE STROKE WO CONTRAST  Result Date: 06/15/2022 CLINICAL DATA:  Code stroke.  Neuro deficit, acute, stroke suspected EXAM: CT HEAD WITHOUT CONTRAST TECHNIQUE: Contiguous axial images were obtained from the base of the skull through the vertex without intravenous contrast. RADIATION DOSE REDUCTION: This exam was performed according to the departmental dose-optimization program which includes automated exposure control, adjustment of the mA and/or kV according to patient size and/or use of iterative reconstruction technique. COMPARISON:  Most recent available study is from 2012 FINDINGS: Brain: No acute intracranial hemorrhage, mass effect, or edema. Gray-white differentiation is preserved age-indeterminate but probably chronic bilateral cerebellar infarcts. Patchy and confluent areas of low-density in the supratentorial white matter are nonspecific but may reflect mild to moderate chronic microvascular ischemic changes. Prominence of the ventricles and sulci reflects generalized parenchymal volume loss. There is no extra-axial collection. Vascular: No hyperdense vessel. There is intracranial atherosclerotic calcification at the skull base. Skull: Unremarkable. Sinuses/Orbits: No acute abnormality. Other: Mastoid air cells are clear. ASPECTS [facility] Stroke Program Early CT Score) - Ganglionic level infarction (caudate, lentiform nuclei, internal capsule, insula, M1-M3 cortex): 7 - Supraganglionic infarction (M4-M6 cortex): 3 Total score (0-10 with 10 being normal): 10 IMPRESSION: There is no acute  intracranial hemorrhage or  evidence of acute infarction. ASPECT score is 10. 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 Chronic microvascular ischemic changes. Electronically Signed   By: Macy Mis M.D.   On: 06/15/2022 16:54   Medications:  sodium chloride     heparin 1,350 Units/hr (06/16/22 0146)    amLODipine  10 mg Oral Daily   aspirin EC  81 mg Oral Daily   atorvastatin  40 mg Oral Daily   carvedilol  6.25 mg Oral BID WC   citalopram  20 mg Oral QPM   clonazePAM  0.5 mg Oral Daily   hydrALAZINE  25 mg Oral Q8H   insulin aspart  0-15 Units Subcutaneous TID WC   isosorbide mononitrate  15 mg Oral Daily   pantoprazole  40 mg Oral Daily   QUEtiapine  300 mg Oral QHS   sodium chloride flush  3 mL Intravenous Q12H   Assessment/ Plan: AKI on CKD 3b - b/l creatinine 1.6 prior to recent heart cath. Creat here 5.0 now 3 wks after heart cath. The labs from 2 days post cath (creat 2.2) and from 9/5 in ED here (creat 3.3) are consistent w/ a typical post-contrast ATN / AKI pattern however cholesterol embolic injury is possible. Cholesterol embolic injury may not improve fully as CIN/ATN may. Will send C3 but likely low utility this far out. Likely only time will tell. UA +small blood (none on microscopy) and 30 protein; FeNa c/w ATN.  Do not suspect GN. Renal US c/w CKD, simple cysts. No clear uremic symptoms. It's possible will need HD in the next few days, also just as likely that he will recover function w/o needing dialysis.  Continue with supportive care.  Seems to be plateauing today Uncont HTN - getting amlodipine, hydralazine, coreg and imdur ordered and titrated by cardiology and BPs now good.  Would avoid hypotension.   CAD - [facility] 8/[facility] as above; Cardiology following - think he'll need CABG ultimately.  Pre op testing hindered by AKI for now.  CT surgery consulting.  A/C systolic CHF - echo with EF 30-35%.  Meds limited by kidney function.  Looks euvolemic today.  DM2 OSA Acute  hypoxic resp failure - as above, improving Tremor/myoclonic jerks - probably multifactorial with AKI + gabapentin 300 qhs.  Improved with d/c gabapentin

## 2022-06-16 NOTE — Progress Notes (Signed)
PROGRESS NOTE    Allen Cortez  BMW:[id number] DOB: 1951/04/12 DOA: 06/12/2022 PCP: Gerome Sam, MD   Brief Narrative: Allen Cortez is a 71 y.o. male with a history of CAD, AAA s/p repair, hypertension, hyperlipidemia, diabetes mellitus type 2, OSA, GERD, renal cell carcinoma s/p partial left nephrectomy, CKD stage IIIa, abdominal wall mass, depression, LV thrombus on Eliquis. Patient presented secondary to AKI s/p recent heart catheterization, concerning for contrast-induced nephropathy. Creatinine continues to worsen. Supportive care. During hospitalization, patient developed transient aphasia and dysarthria with associated headache in setting of heparin IV infusion; code stroke called which revealed evidence of no acute stroke.   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 Baseline creatinine of 1.6. Most recent creatinine of 3.34 5 days prior to admission. Creatinine of 5.07 on admission. Concern for contrast induced nephropathy from recent cardiac catheterization. Nephrology consulted. Patient challenged with Lasix, without improvement of AKI. Not oliguric. Creatinine has possibly reached plateau. -Nephrology recommendations: continue supportive care  CAD Cardiology consulted. Cardiac catheterization performed prior to admission on 8/29 and was significant for severe two vessel disease with recommendation for CABG. Cardiothoracic surgery consulted and are deferring CABG until AKI improved or patient develops evidence of ESRD requiring prolonged HD. -Continue Heparin IV -Continue Imdur  Aphasia/dysarthria Headache Acutely occurred this afternoon. Concern would be hemorrhagic stroke in setting of Heparin IV. NIH of 2 for aphasia and dysarthria. Code stroke called on 9/13. CT head without evidence of acute stroke/hemorrhage. MRI/MRA without evidence of acute stroke. 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 Neurology without further  recommendations.  Acute respiratory failure with hypoxia In setting of acute heart failure. Resolved.  Acute on chronic systolic heart failure LVEF of 30-35% with akinesis of LV and apical segments, in addition to severe hypokinesis of LV, basal-mid anterior wall. Patient is not on ACEi/ARB therapy secondary to AKI. -Continue Coreg  LV mural thrombus Noted on Transthoracic Echocardiogram.  AAA History of aortic aneurysm repair.  Diabetes mellitus, type 2 -Continue SSI  Sleep apnea -Continue CPAP  Hyperlipidemia -Continue Lipitor  GERD -Continue Protonix  Right arm pain Related to IV site. Resolved.  Elevated uric acid Patient started on allopurinol. No clinical evidence of gout. Also, with worsening renal function, not safe at current dose. -Discontinue allopurinol -Recheck uric acid as an outpatient  Depression Celexa reduced secondary to prolonged QTc -Continue Celexa 20 mg daily   DVT prophylaxis: Heparin IV (held for code stroke) Code Status:   Code Status: Full Code Family Communication: None at bedside Disposition Plan: Discharge pending improvement of AKI and ability to receive CABG   Consultants:  Cardiology Nephrology  Procedures:  Transthoracic Echocardiogram   Antimicrobials: None    Subjective: Patient without concerns this morning. Headache is resolved.  Objective: BP (!) 151/87 (BP Location: Left Arm)   Pulse 85   Temp 97.6 F (36.4 C) (Oral)   Resp 18   Ht '5\' 10"'$  (1.778 m)   Wt 73.1 kg   SpO2 96%   BMI 23.12 kg/m   Examination:  General exam: Appears calm and comfortable Respiratory system: Clear to auscultation. Respiratory effort normal. Cardiovascular system: S1 & S2 heard, Normal rate with regular rhythm. Gastrointestinal system: Abdomen is nondistended, soft and nontender. No organomegaly or masses felt. Normal bowel sounds heard. Central nervous system: Alert and oriented. No focal neurological  deficits. Musculoskeletal: No edema. No calf tenderness Skin: No cyanosis. No rashes Psychiatry: Judgement and insight appear normal. Mood & affect appropriate.  Data Reviewed: I have personally reviewed following labs and imaging studies  CBC Lab Results  Component Value Date   WBC 10.5 06/16/2022   RBC 3.45 (L) 06/16/2022   HGB 10.2 (L) 06/16/2022   HCT 30.5 (L) 06/16/2022   MCV 88.4 06/16/2022   MCH 29.6 06/16/2022   PLT 237 06/16/2022   MCHC 33.4 06/16/2022   RDW 15.4 06/16/2022   LYMPHSABS 1.8 06/07/2022   MONOABS 0.6 06/07/2022   EOSABS 1.1 (H) 06/07/2022   BASOSABS 0.1 16/07/9603     Last metabolic panel Lab Results  Component Value Date   NA 139 06/16/2022   K 3.8 06/16/2022   CL 103 06/16/2022   CO2 21 (L) 06/16/2022   BUN 85 (H) 06/16/2022   CREATININE 6.74 (H) 06/16/2022   GLUCOSE 94 06/16/2022   GFRNONAA 8 (L) 06/16/2022   GFRAA 76 (L) 04/09/2014   CALCIUM 9.1 06/16/2022   PHOS 5.8 (H) 06/16/2022   PROT 7.3 04/09/2014   ALBUMIN 3.0 (L) 06/16/2022   BILITOT 0.3 04/09/2014   ALKPHOS 80 04/09/2014   AST 16 04/09/2014   ALT 10 04/09/2014   ANIONGAP 15 06/16/2022    GFR: Estimated Creatinine Clearance: 10.2 mL/min (A) (by C-G formula based on SCr of 6.74 mg/dL (H)).  Recent Results (from the past 240 hour(s))  SARS Coronavirus 2 by RT PCR (hospital order, performed in [facility] hospital lab) *cepheid single result test* Anterior Nasal Swab     Status: None   Collection Time: 06/12/22  5:58 AM   Specimen: Anterior Nasal Swab  Result Value Ref Range Status   SARS Coronavirus 2 by RT PCR NEGATIVE NEGATIVE Final    Comment: (NOTE) SARS-CoV-2 target nucleic acids are NOT DETECTED.  The SARS-CoV-2 RNA is generally detectable in upper and lower respiratory specimens during the acute phase of infection. The lowest concentration of SARS-CoV-2 viral copies this assay can detect is 250 copies / mL. A negative result does not preclude SARS-CoV-2  infection and should not be used as the sole basis for treatment or other patient management decisions.  A negative result may occur with improper specimen collection / handling, submission of specimen other than nasopharyngeal swab, presence of viral mutation(s) within the areas targeted by this assay, and inadequate number of viral copies (<250 copies / mL). A negative result must be combined with clinical observations, patient history, and epidemiological information.  Fact Sheet for Patients:   https://www.patel.info/  Fact Sheet for Healthcare Providers: https://hall.com/  This test is not yet approved or  cleared by the [place] FDA and has been authorized for detection and/or diagnosis of SARS-CoV-2 by FDA under an Emergency Use Authorization (EUA).  This EUA will remain in effect (meaning this test can be used) for the duration of the COVID-19 declaration under Section 564(b)(1) of the Act, 21 U.S.C. section 360bbb-3(b)(1), unless the authorization is terminated or revoked sooner.  Performed at [facility], [place] [address], [place], [place] [address]       Radiology Studies: MR ANGIO HEAD WO CONTRAST  Result Date: 06/16/2022 CLINICAL DATA:  71 year old male code stroke presentation yesterday. EXAM: MRA HEAD WITHOUT CONTRAST TECHNIQUE: Angiographic images of the Circle of Willis were acquired using MRA technique without intravenous contrast. COMPARISON:  Brain MRI today. FINDINGS: Anterior circulation: Antegrade flow in both ICA siphons with mild siphon dolichoectasia. Mild siphon irregularity, but no siphon stenosis. Ophthalmic and posterior communicating artery origins are within normal limits. Patent carotid termini, MCA and ACA origins.  Anterior communicating artery is normal. Azygous ACA A2 anatomy (normal variant). Visible ACA branches are within normal limits. Bilateral MCA M1 segments and bifurcations are patent  without stenosis. Right MCA branches are within normal limits. There is mild irregularity and stenosis of the left MCA superior M2 division near its origin. Other left MCA branches are within normal limits. Posterior circulation: Antegrade flow in the distal left vertebral artery which appears dominant and supplies the basilar. No distal left vertebral stenosis, and the left PICA origin is patent. No flow signal in the distal right vertebral artery, which is likely occluded on series 9, image 24. Right AICA appears patent and dominant. Patent basilar artery with tortuosity. Bilateral AICA, SCA, and PCA origins are patent. Both posterior communicating arteries are present, the left is larger. Bilateral PCA branches are within normal limits. Anatomic variants: Dominant left vertebral artery. Azygous ACA anatomy. Other: Brain MRI today reported separately. IMPRESSION: 1. 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 Dominant appearing left vertebral artery is patent and supplies the Basilar. 2. Generalized intracranial artery tortuosity with otherwise mild intracranial atherosclerosis (left MCA superior M2 division). Electronically Signed   By: Genevie Ann M.D.   On: 06/16/2022 05:58   MR BRAIN WO CONTRAST  Result Date: 06/16/2022 CLINICAL DATA:  71 year old male code stroke presentation yesterday. EXAM: MRI HEAD WITHOUT CONTRAST TECHNIQUE: Multiplanar, multiecho pulse sequences of the brain and surrounding structures were obtained without intravenous contrast. COMPARISON:  Head CT yesterday. FINDINGS: Brain: No restricted diffusion or evidence of acute infarction. There are scattered small chronic infarcts in both cerebellar hemispheres. Chronic small lacunar infarcts in the bilateral basal ganglia. Small area of cortical encephalomalacia in the posterior left parietal lobe (series 6, image 37). And similar small cortical encephalomalacia along the left superior frontal gyrus series 15, image 22.  Mild if any chronic cerebral blood products, suggestion of a chronic subcortical white matter microhemorrhage in the left parietal lobe on series 18, image 34. Brainstem remains normal. No midline shift, mass effect, evidence of mass lesion, ventriculomegaly, extra-axial collection or acute intracranial hemorrhage. Cervicomedullary junction and pituitary are within normal limits. Vascular: Major intracranial vascular flow voids are preserved, the distal left vertebral artery appears dominant and is dolichoectatic. There is other generalized intracranial artery tortuosity. See MRA findings reported separately today. Skull and upper cervical spine: Negative for age visible cervical spine. Visualized bone marrow signal is within normal limits. Sinuses/Orbits: Negative orbits. Paranasal Visualized paranasal sinuses and mastoids are stable and well aerated. Other: Visible internal auditory structures appear normal. Negative stylomastoid foramina, visible scalp and face soft tissues. IMPRESSION: 1. No acute intracranial abnormality. 2. Chronic small and medium-sized vessel ischemia in the Left MCA territory, bilateral cerebellum, and the bilateral basal ganglia. 3. Generalized intracranial artery tortuosity. MRA is reported separately. Electronically Signed   By: Genevie Ann M.D.   On: 06/16/2022 05:53   CT HEAD CODE STROKE WO CONTRAST  Result Date: 06/15/2022 CLINICAL DATA:  Code stroke.  Neuro deficit, acute, stroke suspected EXAM: CT HEAD WITHOUT CONTRAST TECHNIQUE: Contiguous axial images were obtained from the base of the skull through the vertex without intravenous contrast. RADIATION DOSE REDUCTION: This exam was performed according to the departmental dose-optimization program which includes automated exposure control, adjustment of the mA and/or kV according to patient size and/or use of iterative reconstruction technique. COMPARISON:  Most recent available study is from 2012 FINDINGS: Brain: No acute  intracranial hemorrhage, mass effect, or edema. Gray-white differentiation is preserved age-indeterminate but probably  chronic bilateral cerebellar infarcts. Patchy and confluent areas of low-density in the supratentorial white matter are nonspecific but may reflect mild to moderate chronic microvascular ischemic changes. Prominence of the ventricles and sulci reflects generalized parenchymal volume loss. There is no extra-axial collection. Vascular: No hyperdense vessel. There is intracranial atherosclerotic calcification at the skull base. Skull: Unremarkable. Sinuses/Orbits: No acute abnormality. Other: Mastoid air cells are clear. ASPECTS ([facility] Stroke Program Early CT Score) - Ganglionic level infarction (caudate, lentiform nuclei, internal capsule, insula, M1-M3 cortex): 7 - Supraganglionic infarction (M4-M6 cortex): 3 Total score (0-10 with 10 being normal): 10 IMPRESSION: There is no acute intracranial hemorrhage or evidence of acute infarction. ASPECT score is 10. 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 Chronic microvascular ischemic changes. Electronically Signed   By: Macy Mis M.D.   On: 06/15/2022 16:54      LOS: 4 days    Cordelia Poche, MD [facility] 06/16/2022, 2:27 PM   If 7PM-7AM, please contact night-coverage [email or web address]

## 2022-06-16 NOTE — Progress Notes (Signed)
Rounding Note    Patient Name: Allen Cortez Date of Encounter: 06/16/2022  [place] Cardiologist: None   Subjective   No chest pain or dyspnea. Events of last evening noted. Code Stroke called but no evidence of stroke and quick resolution of symptoms  Inpatient Medications    Scheduled Meds:  amLODipine  10 mg Oral Daily   aspirin EC  81 mg Oral Daily   atorvastatin  40 mg Oral Daily   carvedilol  6.25 mg Oral BID WC   citalopram  20 mg Oral QPM   clonazePAM  0.5 mg Oral Daily   hydrALAZINE  25 mg Oral Q8H   insulin aspart  0-15 Units Subcutaneous TID WC   isosorbide mononitrate  15 mg Oral Daily   pantoprazole  40 mg Oral Daily   QUEtiapine  300 mg Oral QHS   sodium chloride flush  3 mL Intravenous Q12H   Continuous Infusions:  sodium chloride     heparin 1,350 Units/hr (06/16/22 0146)   PRN Meds: sodium chloride, acetaminophen, ondansetron (ZOFRAN) IV, sodium chloride flush   Vital Signs    Vitals:   06/15/22 1732 06/15/22 1950 06/16/22 0000 06/16/22 0354  BP: (!) 146/93 137/86 139/87 (!) 152/94  Pulse: 79 85 85 85  Resp:  17 18 (!) 24  Temp:  97.6 F (36.4 C) 97.6 F (36.4 C) 97.6 F (36.4 C)  TempSrc:  Oral Oral Oral  SpO2:  93%    Weight:    73.1 kg  Height:        Intake/Output Summary (Last 24 hours) at 06/16/2022 0710 Last data filed at 06/16/2022 0550 Gross per 24 hour  Intake 1042.29 ml  Output 1000 ml  Net 42.29 ml      06/16/2022    3:54 AM 06/15/2022    4:15 AM 06/14/2022    5:00 AM  Last 3 Weights  Weight (lbs) 161 lb 1.6 oz 161 lb 14.4 oz 162 lb 11.2 oz  Weight (kg) 73.074 kg 73.437 kg 73.8 kg      Telemetry    sinus- Personally Reviewed  ECG    No new tracing  Physical Exam   General: Well developed, well nourished, NAD  HEENT: OP clear, mucus membranes moist  SKIN: warm, dry. No rashes. Neuro: No focal deficits  Musculoskeletal: Muscle strength 5/5 all ext  Psychiatric: Mood and affect normal  Neck:  No JVD Lungs:Clear bilaterally, no wheezes, rhonci, crackles Cardiovascular: Regular rate and rhythm. No murmurs, gallops or rubs. Abdomen:Soft. Bowel sounds present. Non-tender.  Extremities: No lower extremity edema.    Labs    High Sensitivity Troponin:   Recent Labs  Lab 06/12/22 0326 06/12/22 0530  TROPONINIHS 232* 219*     Chemistry Recent Labs  Lab 06/15/22 0455 06/15/22 1739 06/16/22 0320  NA 140 140 139  K 3.8 4.0 3.8  CL 102 100 103  CO2 22 22 21*  GLUCOSE 99 120* 94  BUN 81* 86* 85*  CREATININE 6.41* 6.64* 6.74*  CALCIUM 9.1 9.5 9.1  ALBUMIN  --  3.1* 3.0*  GFRNONAA 9* 8* 8*  ANIONGAP 16* 18* 15    Lipids  Recent Labs  Lab 06/15/22 0455  CHOL 113  TRIG 137  HDL 26*  LDLCALC 60  CHOLHDL 4.3    Hematology Recent Labs  Lab 06/14/22 0457 06/15/22 0455 06/16/22 0320  WBC 8.5 9.1 10.5  RBC 3.37* 3.43* 3.45*  HGB 10.0* 10.3* 10.2*  HCT 29.4* 29.9* 30.5*  MCV 87.2 87.2 88.4  MCH 29.7 30.0 29.6  MCHC 34.0 34.4 33.4  RDW 15.6* 15.5 15.4  PLT 243 243 237   Thyroid No results for input(s): "TSH", "FREET4" in the last 168 hours.  BNPNo results for input(s): "BNP", "PROBNP" in the last 168 hours.  DDimer No results for input(s): "DDIMER" in the last 168 hours.   Radiology    MR ANGIO HEAD WO CONTRAST  Result Date: 06/16/2022 CLINICAL DATA:  71 year old male code stroke presentation yesterday. EXAM: MRA HEAD WITHOUT CONTRAST TECHNIQUE: Angiographic images of the Circle of Willis were acquired using MRA technique without intravenous contrast. COMPARISON:  Brain MRI today. FINDINGS: Anterior circulation: Antegrade flow in both ICA siphons with mild siphon dolichoectasia. Mild siphon irregularity, but no siphon stenosis. Ophthalmic and posterior communicating artery origins are within normal limits. Patent carotid termini, MCA and ACA origins. Anterior communicating artery is normal. Azygous ACA A2 anatomy (normal variant). Visible ACA branches are within  normal limits. Bilateral MCA M1 segments and bifurcations are patent without stenosis. Right MCA branches are within normal limits. There is mild irregularity and stenosis of the left MCA superior M2 division near its origin. Other left MCA branches are within normal limits. Posterior circulation: Antegrade flow in the distal left vertebral artery which appears dominant and supplies the basilar. No distal left vertebral stenosis, and the left PICA origin is patent. No flow signal in the distal right vertebral artery, which is likely occluded on series 9, image 24. Right AICA appears patent and dominant. Patent basilar artery with tortuosity. Bilateral AICA, SCA, and PCA origins are patent. Both posterior communicating arteries are present, the left is larger. Bilateral PCA branches are within normal limits. Anatomic variants: Dominant left vertebral artery. Azygous ACA anatomy. Other: Brain MRI today reported separately. IMPRESSION: 1. 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 Dominant appearing left vertebral artery is patent and supplies the Basilar. 2. Generalized intracranial artery tortuosity with otherwise mild intracranial atherosclerosis (left MCA superior M2 division). Electronically Signed   By: Genevie Ann M.D.   On: 06/16/2022 05:58   MR BRAIN WO CONTRAST  Result Date: 06/16/2022 CLINICAL DATA:  71 year old male code stroke presentation yesterday. EXAM: MRI HEAD WITHOUT CONTRAST TECHNIQUE: Multiplanar, multiecho pulse sequences of the brain and surrounding structures were obtained without intravenous contrast. COMPARISON:  Head CT yesterday. FINDINGS: Brain: No restricted diffusion or evidence of acute infarction. There are scattered small chronic infarcts in both cerebellar hemispheres. Chronic small lacunar infarcts in the bilateral basal ganglia. Small area of cortical encephalomalacia in the posterior left parietal lobe (series 6, image 37). And similar small cortical  encephalomalacia along the left superior frontal gyrus series 15, image 22. Mild if any chronic cerebral blood products, suggestion of a chronic subcortical white matter microhemorrhage in the left parietal lobe on series 18, image 34. Brainstem remains normal. No midline shift, mass effect, evidence of mass lesion, ventriculomegaly, extra-axial collection or acute intracranial hemorrhage. Cervicomedullary junction and pituitary are within normal limits. Vascular: Major intracranial vascular flow voids are preserved, the distal left vertebral artery appears dominant and is dolichoectatic. There is other generalized intracranial artery tortuosity. See MRA findings reported separately today. Skull and upper cervical spine: Negative for age visible cervical spine. Visualized bone marrow signal is within normal limits. Sinuses/Orbits: Negative orbits. Paranasal Visualized paranasal sinuses and mastoids are stable and well aerated. Other: Visible internal auditory structures appear normal. Negative stylomastoid foramina, visible scalp and face soft tissues. IMPRESSION: 1. No acute intracranial abnormality.  2. Chronic small and medium-sized vessel ischemia in the Left MCA territory, bilateral cerebellum, and the bilateral basal ganglia. 3. Generalized intracranial artery tortuosity. MRA is reported separately. Electronically Signed   By: Genevie Ann M.D.   On: 06/16/2022 05:53   CT HEAD CODE STROKE WO CONTRAST  Result Date: 06/15/2022 CLINICAL DATA:  Code stroke.  Neuro deficit, acute, stroke suspected EXAM: CT HEAD WITHOUT CONTRAST TECHNIQUE: Contiguous axial images were obtained from the base of the skull through the vertex without intravenous contrast. RADIATION DOSE REDUCTION: This exam was performed according to the departmental dose-optimization program which includes automated exposure control, adjustment of the mA and/or kV according to patient size and/or use of iterative reconstruction technique. COMPARISON:   Most recent available study is from 2012 FINDINGS: Brain: No acute intracranial hemorrhage, mass effect, or edema. Gray-white differentiation is preserved age-indeterminate but probably chronic bilateral cerebellar infarcts. Patchy and confluent areas of low-density in the supratentorial white matter are nonspecific but may reflect mild to moderate chronic microvascular ischemic changes. Prominence of the ventricles and sulci reflects generalized parenchymal volume loss. There is no extra-axial collection. Vascular: No hyperdense vessel. There is intracranial atherosclerotic calcification at the skull base. Skull: Unremarkable. Sinuses/Orbits: No acute abnormality. Other: Mastoid air cells are clear. ASPECTS ([place] Stroke Program Early CT Score) - Ganglionic level infarction (caudate, lentiform nuclei, internal capsule, insula, M1-M3 cortex): 7 - Supraganglionic infarction (M4-M6 cortex): 3 Total score (0-10 with 10 being normal): 10 IMPRESSION: There is no acute intracranial hemorrhage or evidence of acute infarction. ASPECT score is 10. 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 Chronic microvascular ischemic changes. Electronically Signed   By: Macy Mis M.D.   On: 06/15/2022 16:54    Cardiac Studies   Echo 06/12/22:    1. 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  Left ventricular ejection fraction, by estimation, is 30-35%. The left  ventricle is abnormal. The left  ventricle has focal regional wall motion abnormalities. The left  ventricular internal cavity size was mildly dilated. There is akinesis of  the left ventricular, apical septal wall, inferior wall, anterior wall and  lateral wall. There is akinesis of the  left ventricular, entire apical segment. There is severe hypokinesis of  the left ventricular, basal-mid anterior wall. A falst tendon is present  in the  mid LV cavity.   2. Right ventricular systolic function is normal. The right ventricular  size is normal. There is moderately elevated pulmonary artery systolic  pressure. The estimated right ventricular systolic pressure is 22.0 mmHg.   3. The mitral valve is normal in structure. Mild mitral valve  regurgitation. No evidence of mitral stenosis.   4. The aortic valve is tricuspid. There is moderate calcification of the  aortic valve. Aortic valve regurgitation is not visualized. Aortic valve  sclerosis/calcification is present, without any evidence of aortic  stenosis.   5. The inferior vena cava is dilated in size with >50% respiratory  variability, suggesting right atrial pressure of 8 mmHg.   Patient Profile     71 y.o. male  with a history of CAD, AAA status post repair 7 years ago, hypertension, hyperlipidemia, type 2 diabetes mellitus, OSA, GERD, renal cell CA status post partial left nephrectomy in 2016 with CKD stage IIIa, ischemic cardiomyopathy who is admitted to [facility] with CHF. Pt had undergone workup for AAA and during that workup his echo showed  severe LV dysfunction with LV apical thrombus. Cardiac cath at the [facility] on 05/31/22 with CTO of the LAD and severe mid Circumflex stenosis. He had plans to see CT surgery as an outpatient for CABG. Since his heart cath 3 weeks ago he has been having increasing shortness of breath along with lower extremity pain to the point he can barely walk.  He was seen in the ER on 9 5 and left AMA.  At that point he was in AKI with a creatinine of 3.34 with baseline of 1.5-1.6  Assessment & Plan    CAD with angina: He has severe two vessel CAD by cath at the [facility]. His coronary anatomy is favorable for CABG but this has been delayed due to renal failure. Will consider cardiac MRI for viability of the anterior wall when renal failure resolves. For now, would continue IV heparin, ASA, statin, Imdur and Coreg.    Ischemic cardiomyopathy/Acute systolic AUQ:JFHL  4/56 YBWL=89-37% with clear evidence of LV thrombus. Akinesis of the anterior wall. No significant valve disease. IV lasix management per nephrology. He does not appear volume overloaded on exam.    LV thrombus: Diagnosed at the [place] in August 2023 and had been on Eliquis which is now held. Continue IV heparin.    HTN: BP overall stable. Continue current therapy.    CKD/Acute renal failure: no improvement over last 24 hours. Nephrology is following.    AAA s/p repair with recurrence: Workup at [place]. He will need to have this followed.   For questions or updates, please contact [place] Please consult [email or web address] for contact info under   Signed, Lauree Chandler, MD  06/16/2022, 7:10 AM

## 2022-06-16 NOTE — Progress Notes (Signed)
Subjective: Interval History: His son is at the bedside and he is doing well.  No further episodes of speech disturbance or weakness.  No headaches.  He had Klonopin this morning for anxiety.  Objective: Vital signs in last 24 hours: Temp:  [97.6 F (36.4 C)] 97.6 F (36.4 C) (09/14 0354) Pulse Rate:  [79-85] 85 (09/14 0800) Resp:  [17-24] 18 (09/14 0800) BP: (130-152)/(80-94) 151/87 (09/14 0800) SpO2:  [93 %-96 %] 96 % (09/14 0800) Weight:  [73.1 kg] 73.1 kg (09/14 0354)  Intake/Output from previous day: 09/13 0701 - 09/14 0700 In: 1042.3 [P.O.:478; I.V.:564.3] Out: 1000 [Urine:1000] Intake/Output this shift: Total I/O In: 320 [P.O.:320] Out: 350 [Urine:350] Nutritional status:  Diet Order             Diet heart healthy/carb modified Room service appropriate? Yes; Fluid consistency: Thin  Diet effective now                   General - Well nourished, well developed, in no apparent distress   Ophthalmologic - fundi not visualized due to noncooperation.   Cardiovascular - Regular rate and rhythm   Mental Status -  Level of arousal and orientation to time, place, and person were intact. Slightly agitated. Language including expression, naming, repetition, comprehension was assessed and found intact                 Cranial Nerves II - XII - II - Visual field intact OU. No field cut. III, IV, VI - Extraocular movements intact        V - Facial sensation intact bilaterally VII - Facial movement intact bilaterally VIII - Hearing & vestibular intact bilaterally    Motor Strength - normal strength.  Gait and Station - deferred.    Lab Results: Recent Labs    06/15/22 0455 06/15/22 1739 06/16/22 0320  WBC 9.1  --  10.5  HGB 10.3*  --  10.2*  HCT 29.9*  --  30.5*  PLT 243  --  237  NA 140 140 139  K 3.8 4.0 3.8  CL 102 100 103  CO2 22 22 21*  GLUCOSE 99 120* 94  BUN 81* 86* 85*  CREATININE 6.41* 6.64* 6.74*  CALCIUM 9.1 9.5 9.1   Lipid Panel Recent  Labs    06/15/22 0455  CHOL 113  TRIG 137  HDL 26*  CHOLHDL 4.3  VLDL 27  LDLCALC 60    Studies/Results: MR ANGIO HEAD WO CONTRAST  Result Date: 06/16/2022 CLINICAL DATA:  71 year old male code stroke presentation yesterday. EXAM: MRA HEAD WITHOUT CONTRAST TECHNIQUE: Angiographic images of the Circle of Willis were acquired using MRA technique without intravenous contrast. COMPARISON:  Brain MRI today. FINDINGS: Anterior circulation: Antegrade flow in both ICA siphons with mild siphon dolichoectasia. Mild siphon irregularity, but no siphon stenosis. Ophthalmic and posterior communicating artery origins are within normal limits. Patent carotid termini, MCA and ACA origins. Anterior communicating artery is normal. Azygous ACA A2 anatomy (normal variant). Visible ACA branches are within normal limits. Bilateral MCA M1 segments and bifurcations are patent without stenosis. Right MCA branches are within normal limits. There is mild irregularity and stenosis of the left MCA superior M2 division near its origin. Other left MCA branches are within normal limits. Posterior circulation: Antegrade flow in the distal left vertebral artery which appears dominant and supplies the basilar. No distal left vertebral stenosis, and the left PICA origin is patent. No flow signal in the distal right vertebral artery,  which is likely occluded on series 9, image 24. Right AICA appears patent and dominant. Patent basilar artery with tortuosity. Bilateral AICA, SCA, and PCA origins are patent. Both posterior communicating arteries are present, the left is larger. Bilateral PCA branches are within normal limits. Anatomic variants: Dominant left vertebral artery. Azygous ACA anatomy. Other: Brain MRI today reported separately. IMPRESSION: 1. 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 Dominant appearing left vertebral artery is patent and supplies the Basilar. 2. Generalized intracranial artery tortuosity  with otherwise mild intracranial atherosclerosis (left MCA superior M2 division). Electronically Signed   By: Genevie Ann M.D.   On: 06/16/2022 05:58   MR BRAIN WO CONTRAST  Result Date: 06/16/2022 CLINICAL DATA:  71 year old male code stroke presentation yesterday. EXAM: MRI HEAD WITHOUT CONTRAST TECHNIQUE: Multiplanar, multiecho pulse sequences of the brain and surrounding structures were obtained without intravenous contrast. COMPARISON:  Head CT yesterday. FINDINGS: Brain: No restricted diffusion or evidence of acute infarction. There are scattered small chronic infarcts in both cerebellar hemispheres. Chronic small lacunar infarcts in the bilateral basal ganglia. Small area of cortical encephalomalacia in the posterior left parietal lobe (series 6, image 37). And similar small cortical encephalomalacia along the left superior frontal gyrus series 15, image 22. Mild if any chronic cerebral blood products, suggestion of a chronic subcortical white matter microhemorrhage in the left parietal lobe on series 18, image 34. Brainstem remains normal. No midline shift, mass effect, evidence of mass lesion, ventriculomegaly, extra-axial collection or acute intracranial hemorrhage. Cervicomedullary junction and pituitary are within normal limits. Vascular: Major intracranial vascular flow voids are preserved, the distal left vertebral artery appears dominant and is dolichoectatic. There is other generalized intracranial artery tortuosity. See MRA findings reported separately today. Skull and upper cervical spine: Negative for age visible cervical spine. Visualized bone marrow signal is within normal limits. Sinuses/Orbits: Negative orbits. Paranasal Visualized paranasal sinuses and mastoids are stable and well aerated. Other: Visible internal auditory structures appear normal. Negative stylomastoid foramina, visible scalp and face soft tissues. IMPRESSION: 1. No acute intracranial abnormality. 2. Chronic small and  medium-sized vessel ischemia in the Left MCA territory, bilateral cerebellum, and the bilateral basal ganglia. 3. Generalized intracranial artery tortuosity. MRA is reported separately. Electronically Signed   By: Genevie Ann M.D.   On: 06/16/2022 05:53   CT HEAD CODE STROKE WO CONTRAST  Result Date: 06/15/2022 CLINICAL DATA:  Code stroke.  Neuro deficit, acute, stroke suspected EXAM: CT HEAD WITHOUT CONTRAST TECHNIQUE: Contiguous axial images were obtained from the base of the skull through the vertex without intravenous contrast. RADIATION DOSE REDUCTION: This exam was performed according to the departmental dose-optimization program which includes automated exposure control, adjustment of the mA and/or kV according to patient size and/or use of iterative reconstruction technique. COMPARISON:  Most recent available study is from 2012 FINDINGS: Brain: No acute intracranial hemorrhage, mass effect, or edema. Gray-white differentiation is preserved age-indeterminate but probably chronic bilateral cerebellar infarcts. Patchy and confluent areas of low-density in the supratentorial white matter are nonspecific but may reflect mild to moderate chronic microvascular ischemic changes. Prominence of the ventricles and sulci reflects generalized parenchymal volume loss. There is no extra-axial collection. Vascular: No hyperdense vessel. There is intracranial atherosclerotic calcification at the skull base. Skull: Unremarkable. Sinuses/Orbits: No acute abnormality. Other: Mastoid air cells are clear. ASPECTS [facility] Stroke Program Early CT Score) - Ganglionic level infarction (caudate, lentiform nuclei, internal capsule, insula, M1-M3 cortex): 7 - Supraganglionic infarction (M4-M6 cortex): 3 Total score (0-10 with  10 being normal): 10 IMPRESSION: There is no acute intracranial hemorrhage or evidence of acute infarction. ASPECT score is 10. 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 Chronic  microvascular ischemic changes. Electronically Signed   By: Macy Mis M.D.   On: 06/15/2022 16:54    Medications: I have reviewed the patient's current medications.  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  His headaches have resolved.  His MRI is negative for stroke.  No further recommendations.  Please call with questions.   LOS: 4 days   Brynlea Spindler M Shatonya Passon   Total of 25 mins spent reviewing chart, discussion with patient and family on prognosis, Dx and plan. Discussed case with patient's nurse. Reviewed Imaging personally.

## 2022-06-17 ENCOUNTER — Inpatient Hospital Stay (HOSPITAL_COMMUNITY): Payer: No Typology Code available for payment source

## 2022-06-17 ENCOUNTER — Encounter: Payer: Medicare Other | Admitting: Thoracic Surgery (Cardiothoracic Vascular Surgery)

## 2022-06-17 DIAGNOSIS — I5023 Acute on chronic systolic (congestive) heart failure: Secondary | ICD-10-CM | POA: Diagnosis not present

## 2022-06-17 DIAGNOSIS — I25118 Atherosclerotic heart disease of native coronary artery with other forms of angina pectoris: Secondary | ICD-10-CM | POA: Diagnosis not present

## 2022-06-17 DIAGNOSIS — N170 Acute kidney failure with tubular necrosis: Secondary | ICD-10-CM | POA: Diagnosis not present

## 2022-06-17 DIAGNOSIS — R4701 Aphasia: Secondary | ICD-10-CM | POA: Diagnosis not present

## 2022-06-17 DIAGNOSIS — I5031 Acute diastolic (congestive) heart failure: Secondary | ICD-10-CM | POA: Diagnosis not present

## 2022-06-17 LAB — GLUCOSE, CAPILLARY
Glucose-Capillary: 145 mg/dL — ABNORMAL HIGH (ref 70–99)
Glucose-Capillary: 74 mg/dL (ref 70–99)
Glucose-Capillary: 105 mg/dL — ABNORMAL HIGH (ref 70–99)
Glucose-Capillary: 135 mg/dL — ABNORMAL HIGH (ref 70–99)

## 2022-06-17 LAB — C3 COMPLEMENT: C3 Complement: 145 mg/dL (ref 82–167)

## 2022-06-17 LAB — RENAL FUNCTION PANEL
Albumin: 3 g/dL — ABNORMAL LOW (ref 3.5–5.0)
Anion gap: 15 (ref 5–15)
BUN: 87 mg/dL — ABNORMAL HIGH (ref 8–23)
CO2: 22 mmol/L (ref 22–32)
Calcium: 9.2 mg/dL (ref 8.9–10.3)
Chloride: 103 mmol/L (ref 98–111)
Creatinine, Ser: 7.37 mg/dL — ABNORMAL HIGH (ref 0.61–1.24)
GFR, Estimated: 7 mL/min — ABNORMAL LOW (ref >60)
Glucose, Bld: 94 mg/dL (ref 70–99)
Phosphorus: 5.9 mg/dL — ABNORMAL HIGH (ref 2.5–4.6)
Potassium: 4 mmol/L (ref 3.5–5.1)
Sodium: 140 mmol/L (ref 135–145)

## 2022-06-17 LAB — CBC
HCT: 30.2 % — ABNORMAL LOW (ref 39.0–52.0)
Hemoglobin: 10 g/dL — ABNORMAL LOW (ref 13.0–17.0)
MCH: 29.2 pg (ref 26.0–34.0)
MCHC: 33.1 g/dL (ref 30.0–36.0)
MCV: 88 fL (ref 80.0–100.0)
Platelets: 244 10*3/uL (ref 150–400)
RBC: 3.43 MIL/uL — ABNORMAL LOW (ref 4.22–5.81)
RDW: 15.6 % — ABNORMAL HIGH (ref 11.5–15.5)
WBC: 9.9 10*3/uL (ref 4.0–10.5)
nRBC: 0 % (ref 0.0–0.2)

## 2022-06-17 LAB — HEPARIN LEVEL (UNFRACTIONATED): Heparin Unfractionated: 0.56 IU/mL (ref 0.30–0.70)

## 2022-06-17 LAB — APTT: aPTT: 67 seconds — ABNORMAL HIGH (ref 24–36)

## 2022-06-17 MED ORDER — LACTATED RINGERS IV SOLN: INTRAVENOUS | Status: DC

## 2022-06-17 NOTE — Progress Notes (Signed)
[facility] Progress Note   Subjective:   Patient states he feels okay today.  Somewhat depressed about minimal progress but otherwise no issues.  Specifically denies shortness of breath, nausea, vomiting, diarrhea.  Objective Vitals:   06/16/22 1628 06/17/22 0125 06/17/22 0613 06/17/22 0837  BP: (!) 127/92 119/64 (!) 149/91 (!) 142/93  Pulse:   79 82  Resp: (!) '22 18 20 '$ (!) 21  Temp: 98.4 F (36.9 C) 98.2 F (36.8 C) 98 F (36.7 C) 97.6 F (36.4 C)  TempSrc: Oral Oral Oral Oral  SpO2: 92% 92% 93% 97%  Weight:   72.3 kg   Height:       Physical Exam General: In bed in no distress Heart: Normal rate, no rub Lungs: Bilateral chest rise with no increased work of breathing, clear to auscultation bilaterally Abdomen: soft, nondistended Extremities: no edema, warm and well perfused Neuro: Minimal tremor, conversant and oriented  Additional Objective Labs: Basic Metabolic Panel: Recent Labs  Lab 06/15/22 1739 06/16/22 0320 06/17/22 0405  NA 140 139 140  K 4.0 3.8 4.0  CL 100 103 103  CO2 22 21* 22  GLUCOSE 120* 94 94  BUN 86* 85* 87*  CREATININE 6.64* 6.74* 7.37*  CALCIUM 9.5 9.1 9.2  PHOS 5.4* 5.8* 5.9*   Liver Function Tests: Recent Labs  Lab 06/15/22 1739 06/16/22 0320 06/17/22 0405  ALBUMIN 3.1* 3.0* 3.0*   No results for input(s): "LIPASE", "AMYLASE" in the last 168 hours. CBC: Recent Labs  Lab 06/13/22 0200 06/14/22 0457 06/15/22 0455 06/16/22 0320 06/17/22 0405  WBC 9.6 8.5 9.1 10.5 9.9  HGB 10.1* 10.0* 10.3* 10.2* 10.0*  HCT 30.3* 29.4* 29.9* 30.5* 30.2*  MCV 88.6 87.2 87.2 88.4 88.0  PLT 249 243 243 237 244   Blood Culture No results found for: "SDES", "SPECREQUEST", "CULT", "REPTSTATUS"  Cardiac Enzymes: No results for input(s): "CKTOTAL", "CKMB", "CKMBINDEX", "TROPONINI" in the last 168 hours.  CBG: Recent Labs  Lab 06/16/22 0557 06/16/22 1149 06/16/22 1626 06/16/22 2111 06/17/22 0631  GLUCAP 108* 118* 164* 96  145*   Iron Studies: No results for input(s): "IRON", "TIBC", "TRANSFERRIN", "FERRITIN" in the last 72 hours. '@lablastinr3'$ @ Studies/Results: MR ANGIO HEAD WO CONTRAST  Result Date: 06/16/2022 CLINICAL DATA:  71 year old male code stroke presentation yesterday. EXAM: MRA HEAD WITHOUT CONTRAST TECHNIQUE: Angiographic images of the Circle of Willis were acquired using MRA technique without intravenous contrast. COMPARISON:  Brain MRI today. FINDINGS: Anterior circulation: Antegrade flow in both ICA siphons with mild siphon dolichoectasia. Mild siphon irregularity, but no siphon stenosis. Ophthalmic and posterior communicating artery origins are within normal limits. Patent carotid termini, MCA and ACA origins. Anterior communicating artery is normal. Azygous ACA A2 anatomy (normal variant). Visible ACA branches are within normal limits. Bilateral MCA M1 segments and bifurcations are patent without stenosis. Right MCA branches are within normal limits. There is mild irregularity and stenosis of the left MCA superior M2 division near its origin. Other left MCA branches are within normal limits. Posterior circulation: Antegrade flow in the distal left vertebral artery which appears dominant and supplies the basilar. No distal left vertebral stenosis, and the left PICA origin is patent. No flow signal in the distal right vertebral artery, which is likely occluded on series 9, image 24. Right AICA appears patent and dominant. Patent basilar artery with tortuosity. Bilateral AICA, SCA, and PCA origins are patent. Both posterior communicating arteries are present, the left is larger. Bilateral PCA branches are within normal limits. Anatomic variants:  Dominant left vertebral artery. Azygous ACA anatomy. Other: Brain MRI today reported separately. IMPRESSION: 1. 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 Dominant appearing left vertebral artery is patent and supplies the Basilar. 2. Generalized  intracranial artery tortuosity with otherwise mild intracranial atherosclerosis (left MCA superior M2 division). Electronically Signed   By: Genevie Ann M.D.   On: 06/16/2022 05:58   MR BRAIN WO CONTRAST  Result Date: 06/16/2022 CLINICAL DATA:  71 year old male code stroke presentation yesterday. EXAM: MRI HEAD WITHOUT CONTRAST TECHNIQUE: Multiplanar, multiecho pulse sequences of the brain and surrounding structures were obtained without intravenous contrast. COMPARISON:  Head CT yesterday. FINDINGS: Brain: No restricted diffusion or evidence of acute infarction. There are scattered small chronic infarcts in both cerebellar hemispheres. Chronic small lacunar infarcts in the bilateral basal ganglia. Small area of cortical encephalomalacia in the posterior left parietal lobe (series 6, image 37). And similar small cortical encephalomalacia along the left superior frontal gyrus series 15, image 22. Mild if any chronic cerebral blood products, suggestion of a chronic subcortical white matter microhemorrhage in the left parietal lobe on series 18, image 34. Brainstem remains normal. No midline shift, mass effect, evidence of mass lesion, ventriculomegaly, extra-axial collection or acute intracranial hemorrhage. Cervicomedullary junction and pituitary are within normal limits. Vascular: Major intracranial vascular flow voids are preserved, the distal left vertebral artery appears dominant and is dolichoectatic. There is other generalized intracranial artery tortuosity. See MRA findings reported separately today. Skull and upper cervical spine: Negative for age visible cervical spine. Visualized bone marrow signal is within normal limits. Sinuses/Orbits: Negative orbits. Paranasal Visualized paranasal sinuses and mastoids are stable and well aerated. Other: Visible internal auditory structures appear normal. Negative stylomastoid foramina, visible scalp and face soft tissues. IMPRESSION: 1. No acute intracranial  abnormality. 2. Chronic small and medium-sized vessel ischemia in the Left MCA territory, bilateral cerebellum, and the bilateral basal ganglia. 3. Generalized intracranial artery tortuosity. MRA is reported separately. Electronically Signed   By: Genevie Ann M.D.   On: 06/16/2022 05:53   CT HEAD CODE STROKE WO CONTRAST  Result Date: 06/15/2022 CLINICAL DATA:  Code stroke.  Neuro deficit, acute, stroke suspected EXAM: CT HEAD WITHOUT CONTRAST TECHNIQUE: Contiguous axial images were obtained from the base of the skull through the vertex without intravenous contrast. RADIATION DOSE REDUCTION: This exam was performed according to the departmental dose-optimization program which includes automated exposure control, adjustment of the mA and/or kV according to patient size and/or use of iterative reconstruction technique. COMPARISON:  Most recent available study is from 2012 FINDINGS: Brain: No acute intracranial hemorrhage, mass effect, or edema. Gray-white differentiation is preserved age-indeterminate but probably chronic bilateral cerebellar infarcts. Patchy and confluent areas of low-density in the supratentorial white matter are nonspecific but may reflect mild to moderate chronic microvascular ischemic changes. Prominence of the ventricles and sulci reflects generalized parenchymal volume loss. There is no extra-axial collection. Vascular: No hyperdense vessel. There is intracranial atherosclerotic calcification at the skull base. Skull: Unremarkable. Sinuses/Orbits: No acute abnormality. Other: Mastoid air cells are clear. ASPECTS ([place] Stroke Program Early CT Score) - Ganglionic level infarction (caudate, lentiform nuclei, internal capsule, insula, M1-M3 cortex): 7 - Supraganglionic infarction (M4-M6 cortex): 3 Total score (0-10 with 10 being normal): 10 IMPRESSION: There is no acute intracranial hemorrhage or evidence of acute infarction. ASPECT score is 10. Age-indeterminate but probably chronic small  cerebellar infarcts. Chronic microvascular ischemic changes. Electronically Signed   By: Macy Mis M.D.   On: 06/15/2022 16:54  Medications:  sodium chloride     heparin 1,350 Units/hr (06/16/22 1621)   lactated ringers      amLODipine  10 mg Oral Daily   aspirin EC  81 mg Oral Daily   atorvastatin  40 mg Oral Daily   carvedilol  6.25 mg Oral BID WC   citalopram  20 mg Oral QPM   clonazePAM  0.5 mg Oral Daily   hydrALAZINE  25 mg Oral Q8H   insulin aspart  0-15 Units Subcutaneous TID WC   isosorbide mononitrate  15 mg Oral Daily   pantoprazole  40 mg Oral Daily   QUEtiapine  300 mg Oral QHS   sodium chloride flush  3 mL Intravenous Q12H   Assessment/ Plan: AKI on CKD 3b - b/l creatinine 1.6 prior to recent heart cath. Creat here 5.0 now 3 wks after heart cath. The labs from 2 days post cath (creat 2.2) and from 9/5 in ED here (creat 3.3) are consistent w/ a typical post-contrast ATN / AKI pattern however cholesterol embolic injury is possible. Cholesterol embolic injury may not improve fully as CIN/ATN may. Will send C3 but likely low utility this far out. Likely only time will tell. UA +small blood (none on microscopy) and 30 protein; FeNa c/w ATN.  Do not suspect GN. Renal US c/w CKD, simple cysts. No clear uremic symptoms. It's possible will need HD in the next few days, also just as likely that he will recover function w/o needing dialysis.  Continue with supportive care.  Unfortunately creatinine worsened today, will provide IV fluids with lactated Ringer's at 100 cc/h for 10 hours Uncont HTN - getting amlodipine, hydralazine, coreg and imdur ordered and titrated by cardiology and BPs now good.  Would avoid hypotension.   CAD - sp LHC on 8/22 at [facility] as above; Cardiology following - think he'll need CABG ultimately.  Pre op testing hindered by AKI for now.  CT surgery consulting.  A/C systolic CHF - echo with EF 30-35%.  Meds limited by kidney function.  Looks euvolemic today.   DM2 OSA Acute hypoxic resp failure -overall stable Tremor/myoclonic jerks -minimal issue at this time.  Off gabapentin

## 2022-06-17 NOTE — Progress Notes (Signed)
ANTICOAGULATION CONSULT NOTE - Follow-up Note  Pharmacy Consult for Heparin Indication: chest pain/ACS & LV thrombus  Allergies  Allergen Reactions   Dilaudid [Hydromorphone Hcl] Other (See Comments)    hallucinations   Mirapex [Pramipexole Dihydrochloride] Other (See Comments)    Leg pain   Prednisone Other (See Comments)    irritability    Patient Measurements: Height: '5\' 10"'$  (177.8 cm) Weight: 72.3 kg (159 lb 6.4 oz) IBW/kg (Calculated) : 73 Heparin Dosing Weight: 75.8kg  Vital Signs: Temp: 98 F (36.7 C) (09/15 0613) Temp Source: Oral (09/15 0613) BP: 149/91 (09/15 0613) Pulse Rate: 79 (09/15 0613)  Labs: Recent Labs    06/15/22 0455 06/15/22 1739 06/16/22 0320 06/17/22 0405  HGB 10.3*  --  10.2* 10.0*  HCT 29.9*  --  30.5* 30.2*  PLT 243  --  237 244  APTT 65*  --  57* 67*  HEPARINUNFRC 0.91*  --  0.72* 0.56  CREATININE 6.41* 6.64* 6.74* 7.37*     Estimated Creatinine Clearance: 9.3 mL/min (A) (by C-G formula based on SCr of 7.37 mg/dL (H)).   Medical History: Past Medical History:  Diagnosis Date   AAA (abdominal aortic aneurysm) (HCC)    Abdominal wall mass of left lower quadrant    Basal cell carcinoma    CAD (coronary artery disease) 2011   moderate   Colon polyp    DDD (degenerative disc disease)    Depression    Diabetes mellitus type II    no meds   GERD (gastroesophageal reflux disease)    Hyperlipidemia    Neuromuscular disorder (HCC)    Osteopenia    PAD (peripheral artery disease) (HCC)    Renal cell carcinoma    Restless leg syndrome    Sleep apnea    used a cpap yr ago-does not snore or use it now.    Medications:  Medications Prior to Admission  Medication Sig Dispense Refill Last Dose   amLODipine (NORVASC) 10 MG tablet Take 10 mg by mouth every evening.   06/11/2022   apixaban (ELIQUIS) 5 MG TABS tablet Take 5 mg by mouth 2 (two) times daily.   06/11/2022 at 2000   atorvastatin (LIPITOR) 40 MG tablet Take 40 mg by mouth at  bedtime.   06/11/2022   citalopram (CELEXA) 40 MG tablet Take 40 mg by mouth every evening.   06/11/2022   clonazePAM (KLONOPIN) 0.5 MG tablet Take 0.5 mg by mouth at bedtime.   06/11/2022   gabapentin (NEURONTIN) 300 MG capsule Take 300 mg by mouth 3 (three) times daily.   06/11/2022   lansoprazole (PREVACID) 15 MG capsule Take 15 mg by mouth every morning.   06/11/2022   nicotine (NICODERM CQ - DOSED IN MG/24 HOURS) 21 mg/24hr patch Place 21 mg onto the skin daily.   06/12/2022   QUEtiapine (SEROQUEL) 300 MG tablet Take 300 mg by mouth at bedtime.   06/11/2022    Scheduled:   amLODipine  10 mg Oral Daily   aspirin EC  81 mg Oral Daily   atorvastatin  40 mg Oral Daily   carvedilol  6.25 mg Oral BID WC   citalopram  20 mg Oral QPM   clonazePAM  0.5 mg Oral Daily   hydrALAZINE  25 mg Oral Q8H   insulin aspart  0-15 Units Subcutaneous TID WC   isosorbide mononitrate  15 mg Oral Daily   pantoprazole  40 mg Oral Daily   QUEtiapine  300 mg Oral QHS   sodium  chloride flush  3 mL Intravenous Q12H   Infusions:   sodium chloride     heparin 1,350 Units/hr (06/16/22 1621)   PRN: sodium chloride, acetaminophen, ondansetron (ZOFRAN) IV, sodium chloride flush  Assessment: 55 yom with a 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 Patient presented on 06/12/22 with SOB. Heparin per pharmacy consult placed for chest pain/ACS and LV thrombus .He is also noted with 2V CAD and for possible CABG.  Patient was on apixaban prior to arrival. Last dose 9/9 at 2000. Utilizing aPTT monitoring due to likely falsely high anti-Xa level secondary to DOAC use. -aPTT at goal, heparin level trending down  Goal of Therapy:  Heparin level 0.3-0.7 units/ml aPTT 66-102 seconds Monitor platelets by anticoagulation protocol: Yes   Plan:  Continue IV heparin 1350 units/h Daily aPTT, heparin level, CBC  Hildred Laser,  PharmD Clinical Pharmacist **Pharmacist phone directory can now be found on [email or web address] ([email or web address]).  Listed under [place].

## 2022-06-17 NOTE — Progress Notes (Signed)
Rounding Note    Patient Name: Allen Cortez Date of Encounter: 06/17/2022  [place] Cardiologist: None   Subjective   No chest pain or dyspnea  Inpatient Medications    Scheduled Meds:  amLODipine  10 mg Oral Daily   aspirin EC  81 mg Oral Daily   atorvastatin  40 mg Oral Daily   carvedilol  6.25 mg Oral BID WC   citalopram  20 mg Oral QPM   clonazePAM  0.5 mg Oral Daily   hydrALAZINE  25 mg Oral Q8H   insulin aspart  0-15 Units Subcutaneous TID WC   isosorbide mononitrate  15 mg Oral Daily   pantoprazole  40 mg Oral Daily   QUEtiapine  300 mg Oral QHS   sodium chloride flush  3 mL Intravenous Q12H   Continuous Infusions:  sodium chloride     heparin 1,350 Units/hr (06/16/22 1621)   PRN Meds: sodium chloride, acetaminophen, ondansetron (ZOFRAN) IV, sodium chloride flush   Vital Signs    Vitals:   06/16/22 1628 06/17/22 0125 06/17/22 0613 06/17/22 0837  BP: (!) 127/92 119/64 (!) 149/91 (!) 142/93  Pulse:   79 82  Resp: (!) '22 18 20 '$ (!) 21  Temp: 98.4 F (36.9 C) 98.2 F (36.8 C) 98 F (36.7 C) 97.6 F (36.4 C)  TempSrc: Oral Oral Oral Oral  SpO2: 92% 92% 93% 97%  Weight:   72.3 kg   Height:        Intake/Output Summary (Last 24 hours) at 06/17/2022 0953 Last data filed at 06/17/2022 5284 Gross per 24 hour  Intake 700.66 ml  Output 300 ml  Net 400.66 ml      06/17/2022    6:13 AM 06/16/2022    3:54 AM 06/15/2022    4:15 AM  Last 3 Weights  Weight (lbs) 159 lb 6.4 oz 161 lb 1.6 oz 161 lb 14.4 oz  Weight (kg) 72.303 kg 73.074 kg 73.437 kg      Telemetry    sinus- Personally Reviewed  ECG    No new tracing  Physical Exam   General: Well developed, well nourished, NAD  HEENT: OP clear, mucus membranes moist  SKIN: warm, dry. No rashes. Neuro: No focal deficits  Musculoskeletal: Muscle strength 5/5 all ext  Psychiatric: Mood and affect normal  Neck: No JVD Lungs:Clear bilaterally, no wheezes, rhonci,  crackles Cardiovascular: Regular rate and rhythm. No murmurs, gallops or rubs. Abdomen:Soft. Bowel sounds present. Non-tender.  Extremities: No lower extremity edema.   Labs    High Sensitivity Troponin:   Recent Labs  Lab 06/12/22 0326 06/12/22 0530  TROPONINIHS 232* 219*     Chemistry Recent Labs  Lab 06/15/22 1739 06/16/22 0320 06/17/22 0405  NA 140 139 140  K 4.0 3.8 4.0  CL 100 103 103  CO2 22 21* 22  GLUCOSE 120* 94 94  BUN 86* 85* 87*  CREATININE 6.64* 6.74* 7.37*  CALCIUM 9.5 9.1 9.2  ALBUMIN 3.1* 3.0* 3.0*  GFRNONAA 8* 8* 7*  ANIONGAP 18* 15 15    Lipids  Recent Labs  Lab 06/15/22 0455  CHOL 113  TRIG 137  HDL 26*  LDLCALC 60  CHOLHDL 4.3    Hematology Recent Labs  Lab 06/15/22 0455 06/16/22 0320 06/17/22 0405  WBC 9.1 10.5 9.9  RBC 3.43* 3.45* 3.43*  HGB 10.3* 10.2* 10.0*  HCT 29.9* 30.5* 30.2*  MCV 87.2 88.4 88.0  MCH 30.0 29.6 29.2  MCHC 34.4 33.4 33.1  RDW 15.5 15.4 15.6*  PLT 243 237 244   Thyroid No results for input(s): "TSH", "FREET4" in the last 168 hours.  BNPNo results for input(s): "BNP", "PROBNP" in the last 168 hours.  DDimer No results for input(s): "DDIMER" in the last 168 hours.   Radiology    MR ANGIO HEAD WO CONTRAST  Result Date: 06/16/2022 CLINICAL DATA:  71 year old male code stroke presentation yesterday. EXAM: MRA HEAD WITHOUT CONTRAST TECHNIQUE: Angiographic images of the Circle of Willis were acquired using MRA technique without intravenous contrast. COMPARISON:  Brain MRI today. FINDINGS: Anterior circulation: Antegrade flow in both ICA siphons with mild siphon dolichoectasia. Mild siphon irregularity, but no siphon stenosis. Ophthalmic and posterior communicating artery origins are within normal limits. Patent carotid termini, MCA and ACA origins. Anterior communicating artery is normal. Azygous ACA A2 anatomy (normal variant). Visible ACA branches are within normal limits. Bilateral MCA M1 segments and  bifurcations are patent without stenosis. Right MCA branches are within normal limits. There is mild irregularity and stenosis of the left MCA superior M2 division near its origin. Other left MCA branches are within normal limits. Posterior circulation: Antegrade flow in the distal left vertebral artery which appears dominant and supplies the basilar. No distal left vertebral stenosis, and the left PICA origin is patent. No flow signal in the distal right vertebral artery, which is likely occluded on series 9, image 24. Right AICA appears patent and dominant. Patent basilar artery with tortuosity. Bilateral AICA, SCA, and PCA origins are patent. Both posterior communicating arteries are present, the left is larger. Bilateral PCA branches are within normal limits. Anatomic variants: Dominant left vertebral artery. Azygous ACA anatomy. Other: Brain MRI today reported separately. IMPRESSION: 1. 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 Dominant appearing left vertebral artery is patent and supplies the Basilar. 2. Generalized intracranial artery tortuosity with otherwise mild intracranial atherosclerosis (left MCA superior M2 division). Electronically Signed   By: Genevie Ann M.D.   On: 06/16/2022 05:58   MR BRAIN WO CONTRAST  Result Date: 06/16/2022 CLINICAL DATA:  71 year old male code stroke presentation yesterday. EXAM: MRI HEAD WITHOUT CONTRAST TECHNIQUE: Multiplanar, multiecho pulse sequences of the brain and surrounding structures were obtained without intravenous contrast. COMPARISON:  Head CT yesterday. FINDINGS: Brain: No restricted diffusion or evidence of acute infarction. There are scattered small chronic infarcts in both cerebellar hemispheres. Chronic small lacunar infarcts in the bilateral basal ganglia. Small area of cortical encephalomalacia in the posterior left parietal lobe (series 6, image 37). And similar small cortical encephalomalacia along the left superior frontal gyrus  series 15, image 22. Mild if any chronic cerebral blood products, suggestion of a chronic subcortical white matter microhemorrhage in the left parietal lobe on series 18, image 34. Brainstem remains normal. No midline shift, mass effect, evidence of mass lesion, ventriculomegaly, extra-axial collection or acute intracranial hemorrhage. Cervicomedullary junction and pituitary are within normal limits. Vascular: Major intracranial vascular flow voids are preserved, the distal left vertebral artery appears dominant and is dolichoectatic. There is other generalized intracranial artery tortuosity. See MRA findings reported separately today. Skull and upper cervical spine: Negative for age visible cervical spine. Visualized bone marrow signal is within normal limits. Sinuses/Orbits: Negative orbits. Paranasal Visualized paranasal sinuses and mastoids are stable and well aerated. Other: Visible internal auditory structures appear normal. Negative stylomastoid foramina, visible scalp and face soft tissues. IMPRESSION: 1. No acute intracranial abnormality. 2. Chronic small and medium-sized vessel ischemia in the Left MCA territory, bilateral cerebellum, and  the bilateral basal ganglia. 3. Generalized intracranial artery tortuosity. MRA is reported separately. Electronically Signed   By: Genevie Ann M.D.   On: 06/16/2022 05:53   CT HEAD CODE STROKE WO CONTRAST  Result Date: 06/15/2022 CLINICAL DATA:  Code stroke.  Neuro deficit, acute, stroke suspected EXAM: CT HEAD WITHOUT CONTRAST TECHNIQUE: Contiguous axial images were obtained from the base of the skull through the vertex without intravenous contrast. RADIATION DOSE REDUCTION: This exam was performed according to the departmental dose-optimization program which includes automated exposure control, adjustment of the mA and/or kV according to patient size and/or use of iterative reconstruction technique. COMPARISON:  Most recent available study is from 2012 FINDINGS: Brain:  No acute intracranial hemorrhage, mass effect, or edema. Gray-white differentiation is preserved age-indeterminate but probably chronic bilateral cerebellar infarcts. Patchy and confluent areas of low-density in the supratentorial white matter are nonspecific but may reflect mild to moderate chronic microvascular ischemic changes. Prominence of the ventricles and sulci reflects generalized parenchymal volume loss. There is no extra-axial collection. Vascular: No hyperdense vessel. There is intracranial atherosclerotic calcification at the skull base. Skull: Unremarkable. Sinuses/Orbits: No acute abnormality. Other: Mastoid air cells are clear. ASPECTS ([place] Stroke Program Early CT Score) - Ganglionic level infarction (caudate, lentiform nuclei, internal capsule, insula, M1-M3 cortex): 7 - Supraganglionic infarction (M4-M6 cortex): 3 Total score (0-10 with 10 being normal): 10 IMPRESSION: There is no acute intracranial hemorrhage or evidence of acute infarction. ASPECT score is 10. 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 Chronic microvascular ischemic changes. Electronically Signed   By: Macy Mis M.D.   On: 06/15/2022 16:54    Cardiac Studies   Echo 06/12/22:    1. 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  Left ventricular ejection fraction, by estimation, is 30-35%. The left  ventricle is abnormal. The left  ventricle has focal regional wall motion abnormalities. The left  ventricular internal cavity size was mildly dilated. There is akinesis of  the left ventricular, apical septal wall, inferior wall, anterior wall and  lateral wall. There is akinesis of the  left ventricular, entire apical segment. There is severe hypokinesis of  the left ventricular, basal-mid anterior wall. A falst tendon is present  in the mid LV cavity.   2. Right ventricular systolic function  is normal. The right ventricular  size is normal. There is moderately elevated pulmonary artery systolic  pressure. The estimated right ventricular systolic pressure is 22.2 mmHg.   3. The mitral valve is normal in structure. Mild mitral valve  regurgitation. No evidence of mitral stenosis.   4. The aortic valve is tricuspid. There is moderate calcification of the  aortic valve. Aortic valve regurgitation is not visualized. Aortic valve  sclerosis/calcification is present, without any evidence of aortic  stenosis.   5. The inferior vena cava is dilated in size with >50% respiratory  variability, suggesting right atrial pressure of 8 mmHg.   Patient Profile     71 y.o. male  with a history of CAD, AAA status post repair 7 years ago, hypertension, hyperlipidemia, type 2 diabetes mellitus, OSA, GERD, renal cell CA status post partial left nephrectomy in 2016 with CKD stage IIIa, ischemic cardiomyopathy who is admitted to [facility] with CHF. Pt had undergone workup for AAA and during that workup his echo showed severe LV dysfunction with LV apical thrombus. Cardiac cath at the [facility] on 05/31/22 with  CTO of the LAD and severe mid Circumflex stenosis. He had plans to see CT surgery as an outpatient for CABG. Since his heart cath 3 weeks ago he has been having increasing shortness of breath along with lower extremity pain to the point he can barely walk.  He was seen in the ER on 9 5 and left AMA.  At that point he was in AKI with a creatinine of 3.34 with baseline of 1.5-1.6  Assessment & Plan    CAD with angina: He has severe two vessel CAD by cath at the [facility]. His coronary anatomy is favorable for CABG but this has been delayed due to renal failure. Will consider cardiac MRI for viability of the anterior wall when renal failure resolves.  -Continue ASA, statin, Imdur, Coreg and IV heparin.     Ischemic cardiomyopathy/Acute systolic CHF: Echo 0/27 XAJO=87-86% with clear evidence of LV thrombus. Akinesis of the  anterior wall. No significant valve disease. He does not appear volume overloaded on exam.    LV thrombus: Diagnosed at the [place] in August 2023 and had been on Eliquis which is now held. Continue IV heparin.    HTN: BP overall stable.    CKD/Acute renal failure: no improvement over last 24 hours. Slight worsening. Nephrology is following.    AAA s/p repair with recurrence: Workup at [place]. He will need to have this followed.   For questions or updates, please contact [place] Please consult [email or web address] for contact info under   Signed, Lauree Chandler, MD  06/17/2022, 9:53 AM

## 2022-06-17 NOTE — Progress Notes (Signed)
PROGRESS NOTE    Allen Cortez  HYQ:[id number] DOB: 1951-06-07 DOA: 06/12/2022 PCP: Gerome Sam, MD   Brief Narrative: Allen Cortez is a 71 y.o. male with a history of CAD, AAA s/p repair, hypertension, hyperlipidemia, diabetes mellitus type 2, OSA, GERD, renal cell carcinoma s/p partial left nephrectomy, CKD stage IIIa, abdominal wall mass, depression, LV thrombus on Eliquis. Patient presented secondary to AKI s/p recent heart catheterization, concerning for contrast-induced nephropathy. Creatinine continues to worsen. Supportive care. During hospitalization, patient developed transient aphasia and dysarthria with associated headache in setting of heparin IV infusion; code stroke called which revealed evidence of no acute stroke.   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 Baseline creatinine of 1.6. Most recent creatinine of 3.34 5 days prior to admission. Creatinine of 5.07 on admission. Concern for contrast induced nephropathy from recent cardiac catheterization. Nephrology consulted. Patient challenged with Lasix, without improvement of AKI. Not oliguric. Creatinine continues to rise -Nephrology recommendations: LR IV fluids -Daily RFP  CAD Cardiology consulted. Cardiac catheterization performed prior to admission on 8/29 and was significant for severe two vessel disease with recommendation for CABG. Cardiothoracic surgery consulted and are deferring CABG until AKI improved or patient develops evidence of ESRD requiring prolonged HD. -Continue Heparin IV -Continue Imdur  Aphasia/dysarthria Headache Acutely occurred this afternoon. Concern would be hemorrhagic stroke in setting of Heparin IV. NIH of 2 for aphasia and dysarthria. Code stroke called on 9/13. CT head without evidence of acute stroke/hemorrhage. MRI/MRA without evidence of acute stroke. 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 Neurology without further  recommendations.  Acute respiratory failure with hypoxia In setting of acute heart failure. Resolved.  Acute on chronic systolic heart failure LVEF of 30-35% with akinesis of LV and apical segments, in addition to severe hypokinesis of LV, basal-mid anterior wall. Patient is not on ACEi/ARB therapy secondary to AKI. -Continue Coreg  LV mural thrombus Noted on Transthoracic Echocardiogram.  AAA History of aortic aneurysm repair.  Diabetes mellitus, type 2 -Continue SSI  Sleep apnea -Continue CPAP  Hyperlipidemia -Continue Lipitor  GERD -Continue Protonix  Right arm pain Related to IV site. Resolved.  Elevated uric acid Patient started on allopurinol. No clinical evidence of gout. Also, with worsening renal function, not safe at current dose. Allopurinol discontinued. -Recheck uric acid as an outpatient  Depression Celexa reduced secondary to prolonged QTc -Continue Celexa 20 mg daily   DVT prophylaxis: Heparin IV (held for code stroke) Code Status:   Code Status: Full Code Family Communication: Wife at bedside Disposition Plan: Discharge pending improvement of AKI and ability to receive CABG   Consultants:  Cardiology Nephrology  Procedures:  Transthoracic Echocardiogram   Antimicrobials: None    Subjective: No issues overnight. Unhappy about results of worsening creatinine. Urinating.  Objective: BP (!) 142/93 (BP Location: Right Arm)   Pulse 82   Temp 97.6 F (36.4 C) (Oral)   Resp (!) 21   Ht '5\' 10"'$  (1.778 m)   Wt 72.3 kg   SpO2 97%   BMI 22.87 kg/m   Examination:  General exam: Appears calm and comfortable Respiratory system: Clear to auscultation. Respiratory effort normal. Cardiovascular system: S1 & S2 heard, RRR. No murmurs, rubs, gallops or clicks. Gastrointestinal system: Abdomen is nondistended, soft and nontender. No organomegaly or masses felt. Normal bowel sounds heard. Central nervous system: Alert and oriented. No focal  neurological deficits. Musculoskeletal: No edema. No calf tenderness Skin: No cyanosis. No rashes Psychiatry: Judgement and insight  appear normal. Mood & affect appropriate.     Data Reviewed: I have personally reviewed following labs and imaging studies  CBC Lab Results  Component Value Date   WBC 9.9 06/17/2022   RBC 3.43 (L) 06/17/2022   HGB 10.0 (L) 06/17/2022   HCT 30.2 (L) 06/17/2022   MCV 88.0 06/17/2022   MCH 29.2 06/17/2022   PLT 244 06/17/2022   MCHC 33.1 06/17/2022   RDW 15.6 (H) 06/17/2022   LYMPHSABS 1.8 06/07/2022   MONOABS 0.6 06/07/2022   EOSABS 1.1 (H) 06/07/2022   BASOSABS 0.1 93/73/4287     Last metabolic panel Lab Results  Component Value Date   NA 140 06/17/2022   K 4.0 06/17/2022   CL 103 06/17/2022   CO2 22 06/17/2022   BUN 87 (H) 06/17/2022   CREATININE 7.37 (H) 06/17/2022   GLUCOSE 94 06/17/2022   GFRNONAA 7 (L) 06/17/2022   GFRAA 76 (L) 04/09/2014   CALCIUM 9.2 06/17/2022   PHOS 5.9 (H) 06/17/2022   PROT 7.3 04/09/2014   ALBUMIN 3.0 (L) 06/17/2022   BILITOT 0.3 04/09/2014   ALKPHOS 80 04/09/2014   AST 16 04/09/2014   ALT 10 04/09/2014   ANIONGAP 15 06/17/2022    GFR: Estimated Creatinine Clearance: 9.3 mL/min (A) (by C-G formula based on SCr of 7.37 mg/dL (H)).  Recent Results (from the past 240 hour(s))  SARS Coronavirus 2 by RT PCR (hospital order, performed in [facility] hospital lab) *cepheid single result test* Anterior Nasal Swab     Status: None   Collection Time: 06/12/22  5:58 AM   Specimen: Anterior Nasal Swab  Result Value Ref Range Status   SARS Coronavirus 2 by RT PCR NEGATIVE NEGATIVE Final    Comment: (NOTE) SARS-CoV-2 target nucleic acids are NOT DETECTED.  The SARS-CoV-2 RNA is generally detectable in upper and lower respiratory specimens during the acute phase of infection. The lowest concentration of SARS-CoV-2 viral copies this assay can detect is 250 copies / mL. A negative result does not preclude  SARS-CoV-2 infection and should not be used as the sole basis for treatment or other patient management decisions.  A negative result may occur with improper specimen collection / handling, submission of specimen other than nasopharyngeal swab, presence of viral mutation(s) within the areas targeted by this assay, and inadequate number of viral copies (<250 copies / mL). A negative result must be combined with clinical observations, patient history, and epidemiological information.  Fact Sheet for Patients:   https://www.patel.info/  Fact Sheet for Healthcare Providers: https://hall.com/  This test is not yet approved or  cleared by the [place] FDA and has been authorized for detection and/or diagnosis of SARS-CoV-2 by FDA under an Emergency Use Authorization (EUA).  This EUA will remain in effect (meaning this test can be used) for the duration of the COVID-19 declaration under Section 564(b)(1) of the Act, 21 U.S.C. section 360bbb-3(b)(1), unless the authorization is terminated or revoked sooner.  Performed at [facility], [place] [address], [place], [place] [address]       Radiology Studies: MR ANGIO HEAD WO CONTRAST  Result Date: 06/16/2022 CLINICAL DATA:  71 year old male code stroke presentation yesterday. EXAM: MRA HEAD WITHOUT CONTRAST TECHNIQUE: Angiographic images of the Circle of Willis were acquired using MRA technique without intravenous contrast. COMPARISON:  Brain MRI today. FINDINGS: Anterior circulation: Antegrade flow in both ICA siphons with mild siphon dolichoectasia. Mild siphon irregularity, but no siphon stenosis. Ophthalmic and posterior communicating artery origins are  within normal limits. Patent carotid termini, MCA and ACA origins. Anterior communicating artery is normal. Azygous ACA A2 anatomy (normal variant). Visible ACA branches are within normal limits. Bilateral MCA M1 segments and bifurcations are  patent without stenosis. Right MCA branches are within normal limits. There is mild irregularity and stenosis of the left MCA superior M2 division near its origin. Other left MCA branches are within normal limits. Posterior circulation: Antegrade flow in the distal left vertebral artery which appears dominant and supplies the basilar. No distal left vertebral stenosis, and the left PICA origin is patent. No flow signal in the distal right vertebral artery, which is likely occluded on series 9, image 24. Right AICA appears patent and dominant. Patent basilar artery with tortuosity. Bilateral AICA, SCA, and PCA origins are patent. Both posterior communicating arteries are present, the left is larger. Bilateral PCA branches are within normal limits. Anatomic variants: Dominant left vertebral artery. Azygous ACA anatomy. Other: Brain MRI today reported separately. IMPRESSION: 1. 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 Dominant appearing left vertebral artery is patent and supplies the Basilar. 2. Generalized intracranial artery tortuosity with otherwise mild intracranial atherosclerosis (left MCA superior M2 division). Electronically Signed   By: Genevie Ann M.D.   On: 06/16/2022 05:58   MR BRAIN WO CONTRAST  Result Date: 06/16/2022 CLINICAL DATA:  71 year old male code stroke presentation yesterday. EXAM: MRI HEAD WITHOUT CONTRAST TECHNIQUE: Multiplanar, multiecho pulse sequences of the brain and surrounding structures were obtained without intravenous contrast. COMPARISON:  Head CT yesterday. FINDINGS: Brain: No restricted diffusion or evidence of acute infarction. There are scattered small chronic infarcts in both cerebellar hemispheres. Chronic small lacunar infarcts in the bilateral basal ganglia. Small area of cortical encephalomalacia in the posterior left parietal lobe (series 6, image 37). And similar small cortical encephalomalacia along the left superior frontal gyrus series 15, image  22. Mild if any chronic cerebral blood products, suggestion of a chronic subcortical white matter microhemorrhage in the left parietal lobe on series 18, image 34. Brainstem remains normal. No midline shift, mass effect, evidence of mass lesion, ventriculomegaly, extra-axial collection or acute intracranial hemorrhage. Cervicomedullary junction and pituitary are within normal limits. Vascular: Major intracranial vascular flow voids are preserved, the distal left vertebral artery appears dominant and is dolichoectatic. There is other generalized intracranial artery tortuosity. See MRA findings reported separately today. Skull and upper cervical spine: Negative for age visible cervical spine. Visualized bone marrow signal is within normal limits. Sinuses/Orbits: Negative orbits. Paranasal Visualized paranasal sinuses and mastoids are stable and well aerated. Other: Visible internal auditory structures appear normal. Negative stylomastoid foramina, visible scalp and face soft tissues. IMPRESSION: 1. No acute intracranial abnormality. 2. Chronic small and medium-sized vessel ischemia in the Left MCA territory, bilateral cerebellum, and the bilateral basal ganglia. 3. Generalized intracranial artery tortuosity. MRA is reported separately. Electronically Signed   By: Genevie Ann M.D.   On: 06/16/2022 05:53   CT HEAD CODE STROKE WO CONTRAST  Result Date: 06/15/2022 CLINICAL DATA:  Code stroke.  Neuro deficit, acute, stroke suspected EXAM: CT HEAD WITHOUT CONTRAST TECHNIQUE: Contiguous axial images were obtained from the base of the skull through the vertex without intravenous contrast. RADIATION DOSE REDUCTION: This exam was performed according to the departmental dose-optimization program which includes automated exposure control, adjustment of the mA and/or kV according to patient size and/or use of iterative reconstruction technique. COMPARISON:  Most recent available study is from 2012 FINDINGS: Brain: No acute  intracranial hemorrhage, mass  effect, or edema. Gray-white differentiation is preserved age-indeterminate but probably chronic bilateral cerebellar infarcts. Patchy and confluent areas of low-density in the supratentorial white matter are nonspecific but may reflect mild to moderate chronic microvascular ischemic changes. Prominence of the ventricles and sulci reflects generalized parenchymal volume loss. There is no extra-axial collection. Vascular: No hyperdense vessel. There is intracranial atherosclerotic calcification at the skull base. Skull: Unremarkable. Sinuses/Orbits: No acute abnormality. Other: Mastoid air cells are clear. ASPECTS ([place] Stroke Program Early CT Score) - Ganglionic level infarction (caudate, lentiform nuclei, internal capsule, insula, M1-M3 cortex): 7 - Supraganglionic infarction (M4-M6 cortex): 3 Total score (0-10 with 10 being normal): 10 IMPRESSION: There is no acute intracranial hemorrhage or evidence of acute infarction. ASPECT score is 10. 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 Chronic microvascular ischemic changes. Electronically Signed   By: Macy Mis M.D.   On: 06/15/2022 16:54      LOS: 5 days    Cordelia Poche, MD [facility] 06/17/2022, 11:54 AM   If 7PM-7AM, please contact night-coverage [email or web address]

## 2022-06-18 ENCOUNTER — Inpatient Hospital Stay (HOSPITAL_COMMUNITY): Payer: No Typology Code available for payment source

## 2022-06-18 DIAGNOSIS — N170 Acute kidney failure with tubular necrosis: Secondary | ICD-10-CM | POA: Diagnosis not present

## 2022-06-18 DIAGNOSIS — J9601 Acute respiratory failure with hypoxia: Secondary | ICD-10-CM

## 2022-06-18 DIAGNOSIS — I2510 Atherosclerotic heart disease of native coronary artery without angina pectoris: Secondary | ICD-10-CM | POA: Diagnosis not present

## 2022-06-18 DIAGNOSIS — I5022 Chronic systolic (congestive) heart failure: Secondary | ICD-10-CM

## 2022-06-18 DIAGNOSIS — R4701 Aphasia: Secondary | ICD-10-CM | POA: Diagnosis not present

## 2022-06-18 DIAGNOSIS — I5023 Acute on chronic systolic (congestive) heart failure: Secondary | ICD-10-CM | POA: Diagnosis not present

## 2022-06-18 DIAGNOSIS — I5031 Acute diastolic (congestive) heart failure: Secondary | ICD-10-CM | POA: Diagnosis not present

## 2022-06-18 LAB — RENAL FUNCTION PANEL
Albumin: 3 g/dL — ABNORMAL LOW (ref 3.5–5.0)
Anion gap: 13 (ref 5–15)
BUN: 87 mg/dL — ABNORMAL HIGH (ref 8–23)
CO2: 23 mmol/L (ref 22–32)
Calcium: 9.3 mg/dL (ref 8.9–10.3)
Chloride: 103 mmol/L (ref 98–111)
Creatinine, Ser: 7.5 mg/dL — ABNORMAL HIGH (ref 0.61–1.24)
GFR, Estimated: 7 mL/min — ABNORMAL LOW (ref >60)
Glucose, Bld: 106 mg/dL — ABNORMAL HIGH (ref 70–99)
Phosphorus: 6.3 mg/dL — ABNORMAL HIGH (ref 2.5–4.6)
Potassium: 4.4 mmol/L (ref 3.5–5.1)
Sodium: 139 mmol/L (ref 135–145)

## 2022-06-18 LAB — CBC
HCT: 30.8 % — ABNORMAL LOW (ref 39.0–52.0)
Hemoglobin: 10.2 g/dL — ABNORMAL LOW (ref 13.0–17.0)
MCH: 29.6 pg (ref 26.0–34.0)
MCHC: 33.1 g/dL (ref 30.0–36.0)
MCV: 89.3 fL (ref 80.0–100.0)
Platelets: 214 10*3/uL (ref 150–400)
RBC: 3.45 MIL/uL — ABNORMAL LOW (ref 4.22–5.81)
RDW: 15.7 % — ABNORMAL HIGH (ref 11.5–15.5)
WBC: 9.4 10*3/uL (ref 4.0–10.5)
nRBC: 0 % (ref 0.0–0.2)

## 2022-06-18 LAB — GLUCOSE, CAPILLARY
Glucose-Capillary: 103 mg/dL — ABNORMAL HIGH (ref 70–99)
Glucose-Capillary: 104 mg/dL — ABNORMAL HIGH (ref 70–99)
Glucose-Capillary: 124 mg/dL — ABNORMAL HIGH (ref 70–99)
Glucose-Capillary: 116 mg/dL — ABNORMAL HIGH (ref 70–99)

## 2022-06-18 LAB — APTT: aPTT: 70 seconds — ABNORMAL HIGH (ref 24–36)

## 2022-06-18 LAB — HEPARIN LEVEL (UNFRACTIONATED): Heparin Unfractionated: 0.43 IU/mL (ref 0.30–0.70)

## 2022-06-18 MED ORDER — FUROSEMIDE 10 MG/ML IJ SOLN
120.0000 mg | Freq: Once | INTRAVENOUS | Status: AC
  Administered 2022-06-18: 120 mg via INTRAVENOUS
  Filled 2022-06-18: qty 10

## 2022-06-18 NOTE — Progress Notes (Signed)
[facility] Progress Note   Subjective:   Patient feels about the same today.  Denies nausea, vomiting, diarrhea, confusion  Objective Vitals:   06/17/22 2200 06/18/22 0000 06/18/22 0350 06/18/22 0811  BP:   (!) 151/89 (!) 148/88  Pulse: 80  94 98  Resp: (!) 21 (!) 25  (!) 22  Temp:   97.9 F (36.6 C) 97.8 F (36.6 C)  TempSrc:   Oral Oral  SpO2:  96% 94% 95%  Weight:   73.4 kg   Height:       Physical Exam General: In bed in no distress Heart: Normal rate, no rub Lungs: Bilateral chest rise with no increased work of breathing, clear to auscultation bilaterally Abdomen: soft, nondistended Extremities: no edema, warm and well perfused Neuro: Minimal tremor, conversant and oriented  Additional Objective Labs: Basic Metabolic Panel: Recent Labs  Lab 06/16/22 0320 06/17/22 0405 06/18/22 0346  NA 139 140 139  K 3.8 4.0 4.4  CL 103 103 103  CO2 21* 22 23  GLUCOSE 94 94 106*  BUN 85* 87* 87*  CREATININE 6.74* 7.37* 7.50*  CALCIUM 9.1 9.2 9.3  PHOS 5.8* 5.9* 6.3*   Liver Function Tests: Recent Labs  Lab 06/16/22 0320 06/17/22 0405 06/18/22 0346  ALBUMIN 3.0* 3.0* 3.0*   No results for input(s): "LIPASE", "AMYLASE" in the last 168 hours. CBC: Recent Labs  Lab 06/14/22 0457 06/15/22 0455 06/16/22 0320 06/17/22 0405 06/18/22 0346  WBC 8.5 9.1 10.5 9.9 9.4  HGB 10.0* 10.3* 10.2* 10.0* 10.2*  HCT 29.4* 29.9* 30.5* 30.2* 30.8*  MCV 87.2 87.2 88.4 88.0 89.3  PLT 243 243 237 244 214   Blood Culture No results found for: "SDES", "SPECREQUEST", "CULT", "REPTSTATUS"  Cardiac Enzymes: No results for input(s): "CKTOTAL", "CKMB", "CKMBINDEX", "TROPONINI" in the last 168 hours.  CBG: Recent Labs  Lab 06/17/22 0631 06/17/22 1134 06/17/22 1653 06/17/22 2117 06/18/22 0615  GLUCAP 145* 74 105* 135* 103*   Iron Studies: No results for input(s): "IRON", "TIBC", "TRANSFERRIN", "FERRITIN" in the last 72 hours. '@lablastinr3'$ @ Studies/Results: DG  CHEST PORT 1 VIEW  Result Date: 06/17/2022 CLINICAL DATA:  Short of breath EXAM: PORTABLE CHEST 1 VIEW COMPARISON:  06/12/2022 FINDINGS: Two frontal views of the chest demonstrate a stable cardiac silhouette. Chronic scarring and fibrotic changes are again noted, stable. No acute airspace disease, effusion, or pneumothorax. No acute bony abnormalities. IMPRESSION: 1. Chronic background scarring and fibrosis. No acute airspace disease. Electronically Signed   By: Randa Ngo M.D.   On: 06/17/2022 21:38   Medications:  sodium chloride     heparin 1,350 Units/hr (06/18/22 0600)    amLODipine  10 mg Oral Daily   aspirin EC  81 mg Oral Daily   atorvastatin  40 mg Oral Daily   carvedilol  6.25 mg Oral BID WC   citalopram  20 mg Oral QPM   clonazePAM  0.5 mg Oral Daily   hydrALAZINE  25 mg Oral Q8H   insulin aspart  0-15 Units Subcutaneous TID WC   isosorbide mononitrate  15 mg Oral Daily   pantoprazole  40 mg Oral Daily   QUEtiapine  300 mg Oral QHS   sodium chloride flush  3 mL Intravenous Q12H   Assessment/ Plan: AKI on CKD 3b - b/l creatinine 1.6 prior to recent heart cath. Creat here 5.0 now 3 wks after heart cath. The labs from 2 days post cath (creat 2.2) and from 9/5 in ED here (creat 3.3) are consistent  w/ a typical post-contrast ATN / AKI pattern however cholesterol embolic injury is possible. Cholesterol embolic injury may not improve fully as CIN/ATN may.  See 3 normal but minimal utility at this point.  Likely only time will tell. UA +small blood (none on microscopy) and 30 protein; FeNa c/w ATN.  Do not suspect GN. Renal US c/w CKD, simple cysts. No clear uremic symptoms. It's possible will need HD in the next few days, also just as likely that he will recover function w/o needing dialysis.  Continue with supportive care.  Creatinine essentially the same today.  Minimal change to his overall status Uncont HTN - getting amlodipine, hydralazine, coreg and imdur ordered and titrated by  cardiology and BPs now good.  Would avoid hypotension.   CAD - sp LHC on 8/22 at [facility] as above; Cardiology following - think he'll need CABG ultimately.  Pre op testing hindered by AKI for now.  CT surgery consulting.  A/C systolic CHF - echo with EF 30-35%.  Meds limited by kidney function.  Looks euvolemic today.  DM2 OSA Acute hypoxic resp failure -overall stable Tremor/myoclonic jerks -minimal issue at this time.  Off gabapentin

## 2022-06-18 NOTE — Progress Notes (Signed)
ANTICOAGULATION CONSULT NOTE - Follow-up Note  Pharmacy Consult for Heparin Indication: chest pain/ACS & LV thrombus  Allergies  Allergen Reactions   Dilaudid [Hydromorphone Hcl] Other (See Comments)    hallucinations   Mirapex [Pramipexole Dihydrochloride] Other (See Comments)    Leg pain   Prednisone Other (See Comments)    irritability    Patient Measurements: Height: '5\' 10"'$  (177.8 cm) Weight: 73.4 kg (161 lb 14.4 oz) IBW/kg (Calculated) : 73 Heparin Dosing Weight: 75.8kg  Vital Signs: Temp: 97.9 F (36.6 C) (09/16 0350) Temp Source: Oral (09/16 0350) BP: 151/89 (09/16 0350) Pulse Rate: 94 (09/16 0350)  Labs: Recent Labs    06/16/22 0320 06/17/22 0405 06/18/22 0346  HGB 10.2* 10.0* 10.2*  HCT 30.5* 30.2* 30.8*  PLT 237 244 214  APTT 57* 67* 70*  HEPARINUNFRC 0.72* 0.56 0.43  CREATININE 6.74* 7.37* 7.50*    Estimated Creatinine Clearance: 9.2 mL/min (A) (by C-G formula based on SCr of 7.5 mg/dL (H)).   Medical History: Past Medical History:  Diagnosis Date   AAA (abdominal aortic aneurysm) (HCC)    Abdominal wall mass of left lower quadrant    Basal cell carcinoma    CAD (coronary artery disease) 2011   moderate   Colon polyp    DDD (degenerative disc disease)    Depression    Diabetes mellitus type II    no meds   GERD (gastroesophageal reflux disease)    Hyperlipidemia    Neuromuscular disorder (HCC)    Osteopenia    PAD (peripheral artery disease) (HCC)    Renal cell carcinoma    Restless leg syndrome    Sleep apnea    used a cpap yr ago-does not snore or use it now.    Medications:  Medications Prior to Admission  Medication Sig Dispense Refill Last Dose   amLODipine (NORVASC) 10 MG tablet Take 10 mg by mouth every evening.   06/11/2022   apixaban (ELIQUIS) 5 MG TABS tablet Take 5 mg by mouth 2 (two) times daily.   06/11/2022 at 2000   atorvastatin (LIPITOR) 40 MG tablet Take 40 mg by mouth at bedtime.   06/11/2022   citalopram (CELEXA) 40 MG  tablet Take 40 mg by mouth every evening.   06/11/2022   clonazePAM (KLONOPIN) 0.5 MG tablet Take 0.5 mg by mouth at bedtime.   06/11/2022   gabapentin (NEURONTIN) 300 MG capsule Take 300 mg by mouth 3 (three) times daily.   06/11/2022   lansoprazole (PREVACID) 15 MG capsule Take 15 mg by mouth every morning.   06/11/2022   nicotine (NICODERM CQ - DOSED IN MG/24 HOURS) 21 mg/24hr patch Place 21 mg onto the skin daily.   06/12/2022   QUEtiapine (SEROQUEL) 300 MG tablet Take 300 mg by mouth at bedtime.   06/11/2022    Scheduled:   amLODipine  10 mg Oral Daily   aspirin EC  81 mg Oral Daily   atorvastatin  40 mg Oral Daily   carvedilol  6.25 mg Oral BID WC   citalopram  20 mg Oral QPM   clonazePAM  0.5 mg Oral Daily   hydrALAZINE  25 mg Oral Q8H   insulin aspart  0-15 Units Subcutaneous TID WC   isosorbide mononitrate  15 mg Oral Daily   pantoprazole  40 mg Oral Daily   QUEtiapine  300 mg Oral QHS   sodium chloride flush  3 mL Intravenous Q12H   Infusions:   sodium chloride     heparin  1,350 Units/hr (06/18/22 0600)   PRN: sodium chloride, acetaminophen, ondansetron (ZOFRAN) IV, sodium chloride flush  Assessment: 17 yom with a 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 Patient presented on 06/12/22 with SOB. Patient was on apixaban prior to arrival. Last dose 9/9 at 2000. Heparin per pharmacy consult placed for chest pain/ACS and LV thrombus .He is also noted with 2V CAD and for possible CABG.  Heparin level 0.43 and aPTT 70 on drip rate 1350 units/hr therapeutic. Last two heparin levels therapeutic. Hgb 10.2 and plt 214. No s/sx bleeding noted in chart. Use heparin levels primarily for dosing given on stable regimen.   Goal of Therapy:  Heparin level 0.3-0.7 units/ml aPTT 66-102 seconds Monitor platelets by anticoagulation protocol: Yes   Plan:  Continue heparin infusion at 1350  units/hr  Monitor daily heparin level and CBC Continue to monitor H&H     Gena Fray, PharmD PGY1 Pharmacy Resident   06/18/2022 7:27 AM  **Pharmacist phone directory can now be found on [email or web address] ([email or web address]).  Listed under [place].

## 2022-06-18 NOTE — Progress Notes (Signed)
Rounding Note    Patient Name: Allen Cortez Date of Encounter: 06/18/2022  [facility] Cardiologist: None   Subjective   Feeling well without chest pain or shortness of breath  Inpatient Medications    Scheduled Meds:  amLODipine  10 mg Oral Daily   aspirin EC  81 mg Oral Daily   atorvastatin  40 mg Oral Daily   carvedilol  6.25 mg Oral BID WC   citalopram  20 mg Oral QPM   clonazePAM  0.5 mg Oral Daily   hydrALAZINE  25 mg Oral Q8H   insulin aspart  0-15 Units Subcutaneous TID WC   isosorbide mononitrate  15 mg Oral Daily   pantoprazole  40 mg Oral Daily   QUEtiapine  300 mg Oral QHS   sodium chloride flush  3 mL Intravenous Q12H   Continuous Infusions:  sodium chloride     heparin 1,350 Units/hr (06/18/22 0600)   PRN Meds: sodium chloride, acetaminophen, ondansetron (ZOFRAN) IV, sodium chloride flush   Vital Signs    Vitals:   06/18/22 0350 06/18/22 0811 06/18/22 1121 06/18/22 1143  BP: (!) 151/89 (!) 148/88  129/78  Pulse: 94 98  88  Resp:  (!) 22  (!) 21  Temp: 97.9 F (36.6 C) 97.8 F (36.6 C)  98.4 F (36.9 C)  TempSrc: Oral Oral  Oral  SpO2: 94% 95% 93% 92%  Weight: 73.4 kg     Height:        Intake/Output Summary (Last 24 hours) at 06/18/2022 1242 Last data filed at 06/18/2022 1240 Gross per 24 hour  Intake 1464.42 ml  Output 800 ml  Net 664.42 ml       06/18/2022    3:50 AM 06/17/2022    6:13 AM 06/16/2022    3:54 AM  Last 3 Weights  Weight (lbs) 161 lb 14.4 oz 159 lb 6.4 oz 161 lb 1.6 oz  Weight (kg) 73.437 kg 72.303 kg 73.074 kg      Telemetry    Sinus rhythm-personally reviewed  ECG    No new tracing  Physical Exam   GEN: Well nourished, well developed, in no acute distress  HEENT: normal  Neck: no JVD, carotid bruits, or masses Cardiac: RRR; no murmurs, rubs, or gallops,no edema  Respiratory:  clear to auscultation bilaterally, normal work of breathing GI: soft, nontender, nondistended, + BS MS: no  deformity or atrophy  Skin: warm and dry Neuro:  Strength and sensation are intact Psych: euthymic mood, full affect   Labs    High Sensitivity Troponin:   Recent Labs  Lab 06/12/22 0326 06/12/22 0530  TROPONINIHS 232* 219*      Chemistry Recent Labs  Lab 06/16/22 0320 06/17/22 0405 06/18/22 0346  NA 139 140 139  K 3.8 4.0 4.4  CL 103 103 103  CO2 21* 22 23  GLUCOSE 94 94 106*  BUN 85* 87* 87*  CREATININE 6.74* 7.37* 7.50*  CALCIUM 9.1 9.2 9.3  ALBUMIN 3.0* 3.0* 3.0*  GFRNONAA 8* 7* 7*  ANIONGAP '15 15 13     '$ Lipids  Recent Labs  Lab 06/15/22 0455  CHOL 113  TRIG 137  HDL 26*  LDLCALC 60  CHOLHDL 4.3     Hematology Recent Labs  Lab 06/16/22 0320 06/17/22 0405 06/18/22 0346  WBC 10.5 9.9 9.4  RBC 3.45* 3.43* 3.45*  HGB 10.2* 10.0* 10.2*  HCT 30.5* 30.2* 30.8*  MCV 88.4 88.0 89.3  MCH 29.6 29.2 29.6  MCHC  33.4 33.1 33.1  RDW 15.4 15.6* 15.7*  PLT 237 244 214    Thyroid No results for input(s): "TSH", "FREET4" in the last 168 hours.  BNPNo results for input(s): "BNP", "PROBNP" in the last 168 hours.  DDimer No results for input(s): "DDIMER" in the last 168 hours.   Radiology    DG CHEST PORT 1 VIEW  Result Date: 06/17/2022 CLINICAL DATA:  Short of breath EXAM: PORTABLE CHEST 1 VIEW COMPARISON:  06/12/2022 FINDINGS: Two frontal views of the chest demonstrate a stable cardiac silhouette. Chronic scarring and fibrotic changes are again noted, stable. No acute airspace disease, effusion, or pneumothorax. No acute bony abnormalities. IMPRESSION: 1. Chronic background scarring and fibrosis. No acute airspace disease. Electronically Signed   By: Randa Ngo M.D.   On: 06/17/2022 21:38    Cardiac Studies   Echo 06/12/22:    1. 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  Left ventricular ejection fraction, by estimation, is 30-35%. The left  ventricle  is abnormal. The left  ventricle has focal regional wall motion abnormalities. The left  ventricular internal cavity size was mildly dilated. There is akinesis of  the left ventricular, apical septal wall, inferior wall, anterior wall and  lateral wall. There is akinesis of the  left ventricular, entire apical segment. There is severe hypokinesis of  the left ventricular, basal-mid anterior wall. A falst tendon is present  in the mid LV cavity.   2. Right ventricular systolic function is normal. The right ventricular  size is normal. There is moderately elevated pulmonary artery systolic  pressure. The estimated right ventricular systolic pressure is 33.0 mmHg.   3. The mitral valve is normal in structure. Mild mitral valve  regurgitation. No evidence of mitral stenosis.   4. The aortic valve is tricuspid. There is moderate calcification of the  aortic valve. Aortic valve regurgitation is not visualized. Aortic valve  sclerosis/calcification is present, without any evidence of aortic  stenosis.   5. The inferior vena cava is dilated in size with >50% respiratory  variability, suggesting right atrial pressure of 8 mmHg.   Patient Profile     71 y.o. male  with a history of CAD, AAA status post repair 7 years ago, hypertension, hyperlipidemia, type 2 diabetes mellitus, OSA, GERD, renal cell CA status post partial left nephrectomy in 2016 with CKD stage IIIa, ischemic cardiomyopathy who is admitted to [facility] with CHF. Pt had undergone workup for AAA and during that workup his echo showed severe LV dysfunction with LV apical thrombus. Cardiac cath at the [facility] on 05/31/22 with CTO of the LAD and severe mid Circumflex stenosis. He had plans to see CT surgery as an outpatient for CABG. Since his heart cath 3 weeks ago he has been having increasing shortness of breath along with lower extremity pain to the point he can barely walk.  He was seen in the ER on 9 5 and left AMA.  At that point he was in AKI  with a creatinine of 3.34 with baseline of 1.5-1.6  Assessment & Plan    1.  Coronary artery disease: Has two-vessel coronary artery disease by catheterization at the [place].  Coronary anatomy is favorable for CABG but this is delayed due to renal failure.  Would potentially benefit from cardiac MRI to check for viability.  2.  Chronic systolic heart failure: Due to ischemic cardiomyopathy.  Has LV thrombus.  Akinesis of the anterior wall.  No obvious volume overload.  3.  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  Continue IV heparin.  4.  Hypertension: Blood pressure stable.  5.  Acute on chronic renal failure: Creatinine essentially stable from yesterday.  Nephrology following.  6.  AAA status post repair with recurrence: Work-up at the [place].   For questions or updates, please contact [place] Please consult [email or web address] for contact info under   Signed, Harless Molinari Meredith Leeds, MD  06/18/2022, 12:42 PM

## 2022-06-18 NOTE — Progress Notes (Addendum)
PROGRESS NOTE    Allen Cortez  TWS:[id number] DOB: August 04, 1951 DOA: 06/12/2022 PCP: Gerome Sam, MD   Brief Narrative: Allen Cortez is a 71 y.o. male with a history of CAD, AAA s/p repair, hypertension, hyperlipidemia, diabetes mellitus type 2, OSA, GERD, renal cell carcinoma s/p partial left nephrectomy, CKD stage IIIa, abdominal wall mass, depression, LV thrombus on Eliquis. Patient presented secondary to AKI s/p recent heart catheterization, concerning for contrast-induced nephropathy. Creatinine continues to worsen. Supportive care. During hospitalization, patient developed transient aphasia and dysarthria with associated headache in setting of heparin IV infusion; code stroke called which revealed evidence of no acute stroke.   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 Baseline creatinine of 1.6. Most recent creatinine of 3.34 5 days prior to admission. Creatinine of 5.07 on admission. Concern for contrast induced nephropathy from recent cardiac catheterization. Nephrology consulted. Patient challenged with Lasix and IV fluids, without improvement of AKI. Not oliguric. Creatinine continues to rise -Nephrology recommendations: supportive care, considering HD -Daily RFP  CAD Cardiology consulted. Cardiac catheterization performed prior to admission on 8/29 and was significant for severe two vessel disease with recommendation for CABG. Cardiothoracic surgery consulted and are deferring CABG until AKI improved or patient develops evidence of ESRD requiring prolonged HD. -Continue Heparin IV -Continue Imdur  Aphasia/dysarthria Headache Acutely occurred this afternoon. Concern would be hemorrhagic stroke in setting of Heparin IV. NIH of 2 for aphasia and dysarthria. Code stroke called on 9/13. CT head without evidence of acute stroke/hemorrhage. MRI/MRA without evidence of acute stroke. 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 Neurology without  further recommendations.  Acute respiratory failure with hypoxia Initially secondary to acute heart failure and resolved with diuresis. Recurrent hypoxia with oxygen requirement up to 6 L/min. Possibly secondary to recently administered IV fluids in setting of known heart failure and AKI. -Repeat chest x-ray -May need to consider Lasix  Acute on chronic systolic heart failure LVEF of 30-35% with akinesis of LV and apical segments, in addition to severe hypokinesis of LV, basal-mid anterior wall. Patient is not on ACEi/ARB therapy secondary to AKI. -Continue Coreg  LV mural thrombus Noted on Transthoracic Echocardiogram.  AAA History of aortic aneurysm repair.  Diabetes mellitus, type 2 -Continue SSI  Sleep apnea -Continue CPAP  Hyperlipidemia -Continue Lipitor  GERD -Continue Protonix  Right arm pain Related to IV site. Resolved.  Elevated uric acid Patient started on allopurinol. No clinical evidence of gout. Also, with worsening renal function, not safe at current dose. Allopurinol discontinued. -Recheck uric acid as an outpatient  Depression Celexa reduced secondary to prolonged QTc -Continue Celexa 20 mg daily   DVT prophylaxis: Heparin IV (held for code stroke) Code Status:   Code Status: Full Code Family Communication: Wife at bedside Disposition Plan: Discharge pending improvement of AKI and ability to receive CABG   Consultants:  Cardiology Nephrology  Procedures:  Transthoracic Echocardiogram   Antimicrobials: None    Subjective: Dyspnea overnight. No shortness of breath at this time with oxygen.  Objective: BP 129/78 (BP Location: Left Arm)   Pulse 88   Temp 98.4 F (36.9 C) (Oral)   Resp (!) 21   Ht '5\' 10"'$  (1.778 m)   Wt 73.4 kg   SpO2 92%   BMI 23.23 kg/m   Examination:  General exam: Appears calm and comfortable Respiratory system: Rales. Respiratory effort normal. Cardiovascular system: S1 & S2 heard, RRR. Gastrointestinal  system: Abdomen is nondistended, soft and nontender. No organomegaly or  masses felt. Normal bowel sounds heard. Central nervous system: Alert and oriented. No focal neurological deficits. Musculoskeletal: No edema. No calf tenderness Skin: No cyanosis. No rashes Psychiatry: Judgement and insight appear normal. Mood & affect appropriate.     Data Reviewed: I have personally reviewed following labs and imaging studies  CBC Lab Results  Component Value Date   WBC 9.4 06/18/2022   RBC 3.45 (L) 06/18/2022   HGB 10.2 (L) 06/18/2022   HCT 30.8 (L) 06/18/2022   MCV 89.3 06/18/2022   MCH 29.6 06/18/2022   PLT 214 06/18/2022   MCHC 33.1 06/18/2022   RDW 15.7 (H) 06/18/2022   LYMPHSABS 1.8 06/07/2022   MONOABS 0.6 06/07/2022   EOSABS 1.1 (H) 06/07/2022   BASOSABS 0.1 37/85/8850     Last metabolic panel Lab Results  Component Value Date   NA 139 06/18/2022   K 4.4 06/18/2022   CL 103 06/18/2022   CO2 23 06/18/2022   BUN 87 (H) 06/18/2022   CREATININE 7.50 (H) 06/18/2022   GLUCOSE 106 (H) 06/18/2022   GFRNONAA 7 (L) 06/18/2022   GFRAA 76 (L) 04/09/2014   CALCIUM 9.3 06/18/2022   PHOS 6.3 (H) 06/18/2022   PROT 7.3 04/09/2014   ALBUMIN 3.0 (L) 06/18/2022   BILITOT 0.3 04/09/2014   ALKPHOS 80 04/09/2014   AST 16 04/09/2014   ALT 10 04/09/2014   ANIONGAP 13 06/18/2022    GFR: Estimated Creatinine Clearance: 9.2 mL/min (A) (by C-G formula based on SCr of 7.5 mg/dL (H)).  Recent Results (from the past 240 hour(s))  SARS Coronavirus 2 by RT PCR (hospital order, performed in [facility] hospital lab) *cepheid single result test* Anterior Nasal Swab     Status: None   Collection Time: 06/12/22  5:58 AM   Specimen: Anterior Nasal Swab  Result Value Ref Range Status   SARS Coronavirus 2 by RT PCR NEGATIVE NEGATIVE Final    Comment: (NOTE) SARS-CoV-2 target nucleic acids are NOT DETECTED.  The SARS-CoV-2 RNA is generally detectable in upper and lower respiratory specimens  during the acute phase of infection. The lowest concentration of SARS-CoV-2 viral copies this assay can detect is 250 copies / mL. A negative result does not preclude SARS-CoV-2 infection and should not be used as the sole basis for treatment or other patient management decisions.  A negative result may occur with improper specimen collection / handling, submission of specimen other than nasopharyngeal swab, presence of viral mutation(s) within the areas targeted by this assay, and inadequate number of viral copies (<250 copies / mL). A negative result must be combined with clinical observations, patient history, and epidemiological information.  Fact Sheet for Patients:   https://www.patel.info/  Fact Sheet for Healthcare Providers: https://hall.com/  This test is not yet approved or  cleared by the [place] FDA and has been authorized for detection and/or diagnosis of SARS-CoV-2 by FDA under an Emergency Use Authorization (EUA).  This EUA will remain in effect (meaning this test can be used) for the duration of the COVID-19 declaration under Section 564(b)(1) of the Act, 21 U.S.C. section 360bbb-3(b)(1), unless the authorization is terminated or revoked sooner.  Performed at [facility], [place] [address] [place], [place] [address]       Radiology Studies: DG CHEST PORT 1 VIEW  Result Date: 06/17/2022 CLINICAL DATA:  Short of breath EXAM: PORTABLE CHEST 1 VIEW COMPARISON:  06/12/2022 FINDINGS: Two frontal views of the chest demonstrate a stable cardiac silhouette. Chronic scarring and fibrotic  changes are again noted, stable. No acute airspace disease, effusion, or pneumothorax. No acute bony abnormalities. IMPRESSION: 1. Chronic background scarring and fibrosis. No acute airspace disease. Electronically Signed   By: Randa Ngo M.D.   On: 06/17/2022 21:38      LOS: 6 days    Cordelia Poche, MD [facility] 06/18/2022, 11:57 AM   If 7PM-7AM, please contact night-coverage [email or web address]

## 2022-06-19 ENCOUNTER — Inpatient Hospital Stay (HOSPITAL_COMMUNITY): Payer: No Typology Code available for payment source

## 2022-06-19 DIAGNOSIS — I5023 Acute on chronic systolic (congestive) heart failure: Secondary | ICD-10-CM | POA: Diagnosis not present

## 2022-06-19 DIAGNOSIS — I5031 Acute diastolic (congestive) heart failure: Secondary | ICD-10-CM | POA: Diagnosis not present

## 2022-06-19 DIAGNOSIS — N170 Acute kidney failure with tubular necrosis: Secondary | ICD-10-CM | POA: Diagnosis not present

## 2022-06-19 DIAGNOSIS — R4701 Aphasia: Secondary | ICD-10-CM | POA: Diagnosis not present

## 2022-06-19 LAB — CBC
HCT: 28.6 % — ABNORMAL LOW (ref 39.0–52.0)
Hemoglobin: 9.7 g/dL — ABNORMAL LOW (ref 13.0–17.0)
MCH: 29.9 pg (ref 26.0–34.0)
MCHC: 33.9 g/dL (ref 30.0–36.0)
MCV: 88.3 fL (ref 80.0–100.0)
Platelets: 192 10*3/uL (ref 150–400)
RBC: 3.24 MIL/uL — ABNORMAL LOW (ref 4.22–5.81)
RDW: 15.7 % — ABNORMAL HIGH (ref 11.5–15.5)
WBC: 8.7 10*3/uL (ref 4.0–10.5)
nRBC: 0 % (ref 0.0–0.2)

## 2022-06-19 LAB — HEPATITIS B SURFACE ANTIGEN: Hepatitis B Surface Ag: NONREACTIVE

## 2022-06-19 LAB — RENAL FUNCTION PANEL
Albumin: 3 g/dL — ABNORMAL LOW (ref 3.5–5.0)
Anion gap: 15 (ref 5–15)
BUN: 95 mg/dL — ABNORMAL HIGH (ref 8–23)
CO2: 22 mmol/L (ref 22–32)
Calcium: 9.2 mg/dL (ref 8.9–10.3)
Chloride: 101 mmol/L (ref 98–111)
Creatinine, Ser: 8.45 mg/dL — ABNORMAL HIGH (ref 0.61–1.24)
GFR, Estimated: 6 mL/min — ABNORMAL LOW (ref >60)
Glucose, Bld: 110 mg/dL — ABNORMAL HIGH (ref 70–99)
Phosphorus: 6.5 mg/dL — ABNORMAL HIGH (ref 2.5–4.6)
Potassium: 4.5 mmol/L (ref 3.5–5.1)
Sodium: 138 mmol/L (ref 135–145)

## 2022-06-19 LAB — HEPARIN LEVEL (UNFRACTIONATED): Heparin Unfractionated: 0.34 IU/mL (ref 0.30–0.70)

## 2022-06-19 LAB — BRAIN NATRIURETIC PEPTIDE: B Natriuretic Peptide: 1453.6 pg/mL — ABNORMAL HIGH (ref 0.0–100.0)

## 2022-06-19 LAB — GLUCOSE, CAPILLARY
Glucose-Capillary: 111 mg/dL — ABNORMAL HIGH (ref 70–99)
Glucose-Capillary: 103 mg/dL — ABNORMAL HIGH (ref 70–99)
Glucose-Capillary: 161 mg/dL — ABNORMAL HIGH (ref 70–99)
Glucose-Capillary: 77 mg/dL (ref 70–99)

## 2022-06-19 LAB — HEPATITIS B SURFACE ANTIBODY,QUALITATIVE: Hep B S Ab: NONREACTIVE

## 2022-06-19 MED ORDER — CHLORHEXIDINE GLUCONATE CLOTH 2 % EX PADS
6.0000 | MEDICATED_PAD | Freq: Every day | CUTANEOUS | Status: DC
  Administered 2022-06-19 – 2022-06-25 (×5): 6 via TOPICAL

## 2022-06-19 NOTE — Consult Note (Signed)
Chief Complaint: Renal failure   Referring Physician(s): Santiago Bumpers  Supervising Physician: Aletta Edouard  Patient Status: [facility] - In-pt  History of Present Illness: Allen Cortez is a 71 y.o. male with medical issues including CAD, AAA s/p repair, hypertension, hyperlipidemia, diabetes mellitus type 2, OSA, GERD, renal cell carcinoma s/p partial left nephrectomy, CKD stage IIIa, abdominal wall mass, depression, LV thrombus on Eliquis.  He has a solitary kidney now with acute on chronic renal failure.  Creatinine was 5 on admission.  We are asked to place a tunneled hemodialysis catheter.  Past Medical History:  Diagnosis Date   AAA (abdominal aortic aneurysm) (HCC)    Abdominal wall mass of left lower quadrant    Basal cell carcinoma    CAD (coronary artery disease) 2011   moderate   Colon polyp    DDD (degenerative disc disease)    Depression    Diabetes mellitus type II    no meds   GERD (gastroesophageal reflux disease)    Hyperlipidemia    Neuromuscular disorder (HCC)    Osteopenia    PAD (peripheral artery disease) (HCC)    Renal cell carcinoma    Restless leg syndrome    Sleep apnea    used a cpap yr ago-does not snore or use it now.    Past Surgical History:  Procedure Laterality Date   ABDOMINAL AORTIC ANEURYSM REPAIR     HEMORRHOID SURGERY     HERNIA REPAIR  03/06/12   ventral hernia repair   NEPHRECTOMY     left due to renal cell carcenoma   VENTRAL HERNIA REPAIR  03/06/2012   Procedure: HERNIA REPAIR VENTRAL ADULT;  Surgeon: Joyice Faster. Cornett, MD;  Location: [facility];  Service: General;  Laterality: N/A;  excision of stitch granuloma    Allergies: Dilaudid [hydromorphone hcl], Mirapex [pramipexole dihydrochloride], and Prednisone  Medications: Prior to Admission medications   Medication Sig Start Date End Date Taking? Authorizing Provider  amLODipine (NORVASC) 10 MG tablet Take 10 mg by mouth every evening.   Yes  [provider]  apixaban (ELIQUIS) 5 MG TABS tablet Take 5 mg by mouth 2 (two) times daily. 05/11/22  Yes [provider]  atorvastatin (LIPITOR) 40 MG tablet Take 40 mg by mouth at bedtime. 05/24/22  Yes [provider]  citalopram (CELEXA) 40 MG tablet Take 40 mg by mouth every evening.   Yes [provider]  clonazePAM (KLONOPIN) 0.5 MG tablet Take 0.5 mg by mouth at bedtime. 10/03/21  Yes [provider]  gabapentin (NEURONTIN) 300 MG capsule Take 300 mg by mouth 3 (three) times daily. 10/04/19  Yes [provider]  lansoprazole (PREVACID) 15 MG capsule Take 15 mg by mouth every morning. 01/24/22  Yes [provider]  nicotine (NICODERM CQ - DOSED IN MG/24 HOURS) 21 mg/24hr patch Place 21 mg onto the skin daily.   Yes [provider]  QUEtiapine (SEROQUEL) 300 MG tablet Take 300 mg by mouth at bedtime. 10/03/17  Yes [provider]     Family History  Problem Relation Age of Onset   Diabetes Mother    Aneurysm Mother        in brain   Cancer Father        lung   Depression Father    Cancer Sister        lung, breast   Pancreatitis Brother    Heart disease Sister  MI   Depression Sister     Social History   Socioeconomic History   Marital status: Married    Spouse name: Not on file   Number of children: Not on file   Years of education: Not on file   Highest education level: Not on file  Occupational History   Not on file  Tobacco Use   Smoking status: Every Day    Packs/day: 1.00    Types: Cigarettes   Smokeless tobacco: Never  Substance and Sexual Activity   Alcohol use: Yes    Comment: glass of wine at night   Drug use: No   Sexual activity: Not on file  Other Topics Concern   Not on file  Social History Narrative   Not on file   Social Determinants of Health   Financial Resource Strain: Not on file  Food Insecurity: Not on file  Transportation Needs: Not on file  Physical  Activity: Not on file  Stress: Not on file  Social Connections: Not on file     Review of Systems: A 12 point ROS discussed and pertinent positives are indicated in the HPI above.  All other systems are negative.  Review of Systems  Vital Signs: BP 135/83   Pulse 91   Temp 97.8 F (36.6 C) (Oral)   Resp 17   Ht '5\' 10"'$  (1.778 m)   Wt 161 lb 6 oz (73.2 kg) Comment: scale c  SpO2 98%   BMI 23.16 kg/m   Physical Exam  Imaging: DG CHEST PORT 1 VIEW  Result Date: 06/19/2022 CLINICAL DATA:  Hypoxia EXAM: PORTABLE CHEST 1 VIEW COMPARISON:  Prior chest x-ray 06/18/2022 FINDINGS: Stable cardiomegaly. Mediastinal contours are unchanged. Pulmonary vascular congestion is present with diffuse interstitial prominence. Overall, these findings are slightly improved compared to yesterday. Probable moderate layering right pleural effusion with associated right basilar atelectasis. No pneumothorax. IMPRESSION: 1. Improving pulmonary edema. 2. Moderate layering right pleural effusion. 3. Extensive background chronic pulmonary parenchymal changes and bronchial wall thickening again noted. Electronically Signed   By: Jacqulynn Cadet M.D.   On: 06/19/2022 07:52   DG CHEST PORT 1 VIEW  Result Date: 06/18/2022 CLINICAL DATA:  Shortness of breath EXAM: PORTABLE CHEST 1 VIEW COMPARISON:  06/17/2022 FINDINGS: Transverse diameter of heart is increased. Central pulmonary vessels are prominent. Small right pleural effusion is seen. There are increased interstitial and alveolar markings in right lung. Left lateral CP angle is clear. There is no pneumothorax. IMPRESSION: Cardiomegaly. Increased interstitial and alveolar markings are seen in right lung which may suggest asymmetric pulmonary edema or multifocal pneumonia. Small right pleural effusion. Electronically Signed   By: Elmer Picker M.D.   On: 06/18/2022 12:49   DG CHEST PORT 1 VIEW  Result Date: 06/17/2022 CLINICAL DATA:  Short of breath EXAM:  PORTABLE CHEST 1 VIEW COMPARISON:  06/12/2022 FINDINGS: Two frontal views of the chest demonstrate a stable cardiac silhouette. Chronic scarring and fibrotic changes are again noted, stable. No acute airspace disease, effusion, or pneumothorax. No acute bony abnormalities. IMPRESSION: 1. Chronic background scarring and fibrosis. No acute airspace disease. Electronically Signed   By: Randa Ngo M.D.   On: 06/17/2022 21:38   MR ANGIO HEAD WO CONTRAST  Result Date: 06/16/2022 CLINICAL DATA:  71 year old male code stroke presentation yesterday. EXAM: MRA HEAD WITHOUT CONTRAST TECHNIQUE: Angiographic images of the Circle of Willis were acquired using MRA technique without intravenous contrast. COMPARISON:  Brain MRI today. FINDINGS: Anterior circulation: Antegrade flow in both  ICA siphons with mild siphon dolichoectasia. Mild siphon irregularity, but no siphon stenosis. Ophthalmic and posterior communicating artery origins are within normal limits. Patent carotid termini, MCA and ACA origins. Anterior communicating artery is normal. Azygous ACA A2 anatomy (normal variant). Visible ACA branches are within normal limits. Bilateral MCA M1 segments and bifurcations are patent without stenosis. Right MCA branches are within normal limits. There is mild irregularity and stenosis of the left MCA superior M2 division near its origin. Other left MCA branches are within normal limits. Posterior circulation: Antegrade flow in the distal left vertebral artery which appears dominant and supplies the basilar. No distal left vertebral stenosis, and the left PICA origin is patent. No flow signal in the distal right vertebral artery, which is likely occluded on series 9, image 24. Right AICA appears patent and dominant. Patent basilar artery with tortuosity. Bilateral AICA, SCA, and PCA origins are patent. Both posterior communicating arteries are present, the left is larger. Bilateral PCA branches are within normal limits.  Anatomic variants: Dominant left vertebral artery. Azygous ACA anatomy. Other: Brain MRI today reported separately. IMPRESSION: 1. 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 Dominant appearing left vertebral artery is patent and supplies the Basilar. 2. Generalized intracranial artery tortuosity with otherwise mild intracranial atherosclerosis (left MCA superior M2 division). Electronically Signed   By: Genevie Ann M.D.   On: 06/16/2022 05:58   MR BRAIN WO CONTRAST  Result Date: 06/16/2022 CLINICAL DATA:  71 year old male code stroke presentation yesterday. EXAM: MRI HEAD WITHOUT CONTRAST TECHNIQUE: Multiplanar, multiecho pulse sequences of the brain and surrounding structures were obtained without intravenous contrast. COMPARISON:  Head CT yesterday. FINDINGS: Brain: No restricted diffusion or evidence of acute infarction. There are scattered small chronic infarcts in both cerebellar hemispheres. Chronic small lacunar infarcts in the bilateral basal ganglia. Small area of cortical encephalomalacia in the posterior left parietal lobe (series 6, image 37). And similar small cortical encephalomalacia along the left superior frontal gyrus series 15, image 22. Mild if any chronic cerebral blood products, suggestion of a chronic subcortical white matter microhemorrhage in the left parietal lobe on series 18, image 34. Brainstem remains normal. No midline shift, mass effect, evidence of mass lesion, ventriculomegaly, extra-axial collection or acute intracranial hemorrhage. Cervicomedullary junction and pituitary are within normal limits. Vascular: Major intracranial vascular flow voids are preserved, the distal left vertebral artery appears dominant and is dolichoectatic. There is other generalized intracranial artery tortuosity. See MRA findings reported separately today. Skull and upper cervical spine: Negative for age visible cervical spine. Visualized bone marrow signal is within normal limits.  Sinuses/Orbits: Negative orbits. Paranasal Visualized paranasal sinuses and mastoids are stable and well aerated. Other: Visible internal auditory structures appear normal. Negative stylomastoid foramina, visible scalp and face soft tissues. IMPRESSION: 1. No acute intracranial abnormality. 2. Chronic small and medium-sized vessel ischemia in the Left MCA territory, bilateral cerebellum, and the bilateral basal ganglia. 3. Generalized intracranial artery tortuosity. MRA is reported separately. Electronically Signed   By: Genevie Ann M.D.   On: 06/16/2022 05:53   CT HEAD CODE STROKE WO CONTRAST  Result Date: 06/15/2022 CLINICAL DATA:  Code stroke.  Neuro deficit, acute, stroke suspected EXAM: CT HEAD WITHOUT CONTRAST TECHNIQUE: Contiguous axial images were obtained from the base of the skull through the vertex without intravenous contrast. RADIATION DOSE REDUCTION: This exam was performed according to the departmental dose-optimization program which includes automated exposure control, adjustment of the mA and/or kV according to patient size and/or use of  iterative reconstruction technique. COMPARISON:  Most recent available study is from 2012 FINDINGS: Brain: No acute intracranial hemorrhage, mass effect, or edema. Gray-white differentiation is preserved age-indeterminate but probably chronic bilateral cerebellar infarcts. Patchy and confluent areas of low-density in the supratentorial white matter are nonspecific but may reflect mild to moderate chronic microvascular ischemic changes. Prominence of the ventricles and sulci reflects generalized parenchymal volume loss. There is no extra-axial collection. Vascular: No hyperdense vessel. There is intracranial atherosclerotic calcification at the skull base. Skull: Unremarkable. Sinuses/Orbits: No acute abnormality. Other: Mastoid air cells are clear. ASPECTS ([place] Stroke Program Early CT Score) - Ganglionic level infarction (caudate, lentiform nuclei, internal  capsule, insula, M1-M3 cortex): 7 - Supraganglionic infarction (M4-M6 cortex): 3 Total score (0-10 with 10 being normal): 10 IMPRESSION: There is no acute intracranial hemorrhage or evidence of acute infarction. ASPECT score is 10. 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 Chronic microvascular ischemic changes. Electronically Signed   By: Macy Mis M.D.   On: 06/15/2022 16:54   ECHOCARDIOGRAM COMPLETE  Result Date: 06/12/2022    ECHOCARDIOGRAM REPORT   Patient Name:   Allen Cortez Date of Exam: 06/12/2022 Medical Rec #:  [id number]        Height:       70.0 in Accession #:    [id number]       Weight:       167.0 lb Date of Birth:  1951/04/21       BSA:          1.933 m Patient Age:    79 years         BP:           163/104 mmHg Patient Gender: M                HR:           96 bpm. Exam Location:  Inpatient Procedure: 2D Echo Indications:    dyspnea. Left ventricular clot.  History:        Patient has no prior history of Echocardiogram examinations.                 Risk Factors:Dyslipidemia, Hypertension, Diabetes, Current                 Smoker and Sleep Apnea.  Sonographer:    Johny Chess RDCS Referring Phys: [id number] [place]  1. 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 Left ventricular ejection fraction, by estimation, is 30-35%. The left ventricle is abnormal. The left ventricle has focal regional wall motion abnormalities. The left ventricular internal cavity size was mildly dilated. There is akinesis of the left ventricular, apical septal wall, inferior wall, anterior wall and lateral wall. There is akinesis of the left ventricular, entire apical segment. There is severe hypokinesis of the left ventricular, basal-mid anterior wall. A falst tendon is present in the mid LV cavity.  2. Right ventricular systolic function is normal. The right  ventricular size is normal. There is moderately elevated pulmonary artery systolic pressure. The estimated right ventricular systolic pressure is 37.6 mmHg.  3. The mitral valve is normal in structure. Mild mitral valve regurgitation. No evidence of mitral stenosis.  4. The aortic valve is tricuspid. There is moderate calcification of the aortic valve. Aortic valve regurgitation is not visualized. Aortic valve sclerosis/calcification is present, without any evidence of aortic stenosis.  5.  The inferior vena cava is dilated in size with >50% respiratory variability, suggesting right atrial pressure of 8 mmHg. 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 Left ventricular ejection fraction, by estimation, is 30 to 35%. The left ventricle has moderately decreased function. The left ventricle demonstrates regional wall motion abnormalities. Definity contrast agent was given IV to delineate the left ventricular endocardial borders. The left ventricular internal cavity size was mildly dilated. There is no left ventricular hypertrophy. Left ventricular diastolic parameters are indeterminate. Elevated left ventricular end-diastolic pressure. Right Ventricle: The right ventricular size is normal. No increase in right ventricular wall thickness. Right ventricular systolic function is normal. There is moderately elevated pulmonary artery systolic pressure. The tricuspid regurgitant velocity is 3.06 m/s, and with an assumed right atrial pressure of 8 mmHg, the estimated right ventricular systolic pressure is 99.8 mmHg. Left Atrium: Left atrial size was normal in size. Right Atrium: Right atrial size was normal in size. Pericardium: There is no evidence of pericardial effusion. Mitral Valve: The mitral valve is normal in structure. Mild mitral annular calcification. Mild mitral valve regurgitation. No evidence  of mitral valve stenosis. Tricuspid Valve: The tricuspid valve is normal in structure. Tricuspid valve regurgitation is mild . No evidence of tricuspid stenosis. Aortic Valve: The aortic valve is tricuspid. There is moderate calcification of the aortic valve. Aortic valve regurgitation is not visualized. Aortic valve sclerosis/calcification is present, without any evidence of aortic stenosis. Pulmonic Valve: The pulmonic valve was normal in structure. Pulmonic valve regurgitation is not visualized. No evidence of pulmonic stenosis. Aorta: The aortic root is normal in size and structure. Venous: The inferior vena cava is dilated in size with greater than 50% respiratory variability, suggesting right atrial pressure of 8 mmHg. IAS/Shunts: No atrial level shunt detected by color flow Doppler.  LEFT VENTRICLE PLAX 2D LVIDd:         5.90 cm   Diastology LVIDs:         4.80 cm   LV e' medial:    4.35 cm/s LV PW:         0.90 cm   LV E/e' medial:  21.0 LV IVS:        0.90 cm   LV e' lateral:   6.20 cm/s LVOT diam:     2.30 cm   LV E/e' lateral: 14.8 LV SV:         59 LV SV Index:   31 LVOT Area:     4.15 cm  RIGHT VENTRICLE             IVC RV S prime:     16.10 cm/s  IVC diam: 2.20 cm TAPSE (M-mode): 1.8 cm LEFT ATRIUM             Index        RIGHT ATRIUM           Index LA diam:        4.20 cm 2.17 cm/m   RA Area:     11.40 cm LA Vol (A2C):   40.5 ml 20.95 ml/m  RA Volume:   24.30 ml  12.57 ml/m LA Vol (A4C):   80.0 ml 41.38 ml/m LA Biplane Vol: 62.6 ml 32.38 ml/m  AORTIC VALVE LVOT Vmax:   84.80 cm/s LVOT Vmean:  56.100 cm/s LVOT VTI:    0.142 m  AORTA Ao Root  diam: 3.20 cm Ao Asc diam:  3.20 cm MITRAL VALVE               TRICUSPID VALVE MV Area (PHT): 5.88 cm    TR Peak grad:   37.5 mmHg MV Decel Time: 129 msec    TR Vmax:        306.00 cm/s MV E velocity: 91.50 cm/s MV A velocity: 96.40 cm/s  SHUNTS MV E/A ratio:  0.95        Systemic VTI:  0.14 m                            Systemic Diam: 2.30 cm Fransico Him  MD Electronically signed by Fransico Him MD Signature Date/Time: 06/12/2022/5:48:41 PM    Final    US RENAL  Result Date: 06/12/2022 CLINICAL DATA:  Acute renal failure. Chronic kidney disease, stage IV. Partial left nephrectomy. EXAM: RENAL / URINARY TRACT ULTRASOUND COMPLETE COMPARISON:  MRI of the abdomen without and with contrast 09/12/2010. FINDINGS: Right Kidney: Renal measurements: 11.0 x 5.0 x 5.6 cm = volume: 162 mL. Renal parenchyma is isoechoic to the index organ, the liver. 2 simple cysts are present near the upper pole measuring 2.4 and 2.0 cm respectively. Left Kidney: Renal measurements: 9.5 x 5.5 x 4.7 cm = volume: 129 mL. Renal parenchyma is isoechoic to the index organ, the spleen. A simple cyst is present at the upper pole measuring up 2.2 cm and at the lower pole measuring up 1.8 cm. No stone or solid lesion is present. No obstruction is present. Bladder: Appears normal for degree of bladder distention. Other: None. IMPRESSION: 1. No acute abnormality. 2. Increased echogenicity of the renal parenchyma bilaterally. This is nonspecific, but commonly seen in the setting of medical renal disease. 3. Simple cysts of both kidneys.  Recommend no follow-up. Electronically Signed   By: San Morelle M.D.   On: 06/12/2022 14:32   DG Chest 2 View  Result Date: 06/12/2022 CLINICAL DATA:  Shortness of breath EXAM: CHEST - 2 VIEW COMPARISON:  CT chest dated 04/29/2022 FINDINGS: Subpleural reticulation/fibrosis in the lungs bilaterally, similar to prior CT chest, favoring chronic interstitial lung disease. No superimposed opacity suspicious for pneumonia. No pleural effusion or pneumothorax. The heart top-normal in size. Visualized osseous structures are within normal limits. IMPRESSION: Stable chronic interstitial lung disease. No evidence of acute cardiopulmonary disease. Electronically Signed   By: Julian Hy M.D.   On: 06/12/2022 04:00    Labs:  CBC: Recent Labs    06/16/22 0320  06/17/22 0405 06/18/22 0346 06/19/22 0301  WBC 10.5 9.9 9.4 8.7  HGB 10.2* 10.0* 10.2* 9.7*  HCT 30.5* 30.2* 30.8* 28.6*  PLT 237 244 214 192    COAGS: Recent Labs    06/12/22 0326 06/12/22 1630 06/15/22 0455 06/16/22 0320 06/17/22 0405 06/18/22 0346  INR 1.3*  --   --   --   --   --   APTT  --    < > 65* 57* 67* 70*   < > = values in this interval not displayed.    BMP: Recent Labs    06/16/22 0320 06/17/22 0405 06/18/22 0346 06/19/22 0301  NA 139 140 139 138  K 3.8 4.0 4.4 4.5  CL 103 103 103 101  CO2 21* '22 23 22  '$ GLUCOSE 94 94 106* 110*  BUN 85* 87* 87* 95*  CALCIUM 9.1 9.2 9.3 9.2  CREATININE 6.74* 7.37*  7.50* 8.45*  GFRNONAA 8* 7* 7* 6*    LIVER FUNCTION TESTS: Recent Labs    06/16/22 0320 06/17/22 0405 06/18/22 0346 06/19/22 0301  ALBUMIN 3.0* 3.0* 3.0* 3.0*    TUMOR MARKERS: No results for input(s): "AFPTM", "CEA", "CA199", "CHROMGRNA" in the last 8760 hours.  Assessment and Plan:  Acute on chronic renal failure. Challenged with Lasix without improvement.  Will proceed with image guided placement of a tunneled hemodialysis catheter tomorrow as IR schedule allows.  Risks and benefits discussed with the patient including, but not limited to bleeding, infection, vascular injury, pneumothorax which may require chest tube placement, air embolism or even death  All of the patient's questions were answered, patient is agreeable to proceed. Consent signed and in chart.  Thank you for allowing our service to participate in Allen Cortez 's care.  Electronically Signed: Murrell Redden, PA-C   06/19/2022, 12:27 PM      I spent a total of 20 Minutes   in face to face in clinical consultation, greater than 50% of which was counseling/coordinating care for HD catheter placement.

## 2022-06-19 NOTE — Progress Notes (Signed)
PROGRESS NOTE    Allen Cortez  OXB:[id number] DOB: 12/23/1950 DOA: 06/12/2022 PCP: Gerome Sam, MD   Brief Narrative: Allen Cortez is a 71 y.o. male with a history of CAD, AAA s/p repair, hypertension, hyperlipidemia, diabetes mellitus type 2, OSA, GERD, renal cell carcinoma s/p partial left nephrectomy, CKD stage IIIa, abdominal wall mass, depression, LV thrombus on Eliquis. Patient presented secondary to AKI s/p recent heart catheterization, concerning for contrast-induced nephropathy. Creatinine continues to worsen. Supportive care. During hospitalization, patient developed transient aphasia and dysarthria with associated headache in setting of heparin IV infusion; code stroke called which revealed evidence of no acute stroke.   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 Baseline creatinine of 1.6. Most recent creatinine of 3.34 5 days prior to admission. Creatinine of 5.07 on admission. Concern for contrast induced nephropathy from recent cardiac catheterization. Nephrology consulted. Patient challenged with Lasix and IV fluids, without improvement of AKI. Not oliguric. Creatinine continues to rise -Nephrology recommendations: supportive care, considering HD -Daily RFP  CAD Cardiology consulted. Cardiac catheterization performed prior to admission on 8/29 and was significant for severe two vessel disease with recommendation for CABG. Cardiothoracic surgery consulted and are deferring CABG until AKI improved or patient develops evidence of ESRD requiring prolonged HD. -Continue Heparin IV -Continue Imdur  Aphasia/dysarthria Headache Acutely occurred this afternoon. Concern would be hemorrhagic stroke in setting of Heparin IV. NIH of 2 for aphasia and dysarthria. Code stroke called on 9/13. CT head without evidence of acute stroke/hemorrhage. MRI/MRA without evidence of acute stroke. 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 Neurology without  further recommendations.  Acute respiratory failure with hypoxia Initially secondary to acute heart failure and resolved with diuresis. Recurrent hypoxia with oxygen requirement up to 6 L/min. Possibly secondary to recently administered IV fluids in setting of known heart failure and AKI. BNP of 1,453. Lasix given x1 on 9/16 with some improvement in oxygen requirement -Continue oxygen supplementation and wean as able  Acute on chronic systolic heart failure LVEF of 30-35% with akinesis of LV and apical segments, in addition to severe hypokinesis of LV, basal-mid anterior wall. Patient is not on ACEi/ARB therapy secondary to AKI. -Continue Coreg  LV mural thrombus Noted on Transthoracic Echocardiogram.  AAA History of aortic aneurysm repair.  Diabetes mellitus, type 2 -Continue SSI  Sleep apnea -Continue CPAP  Hyperlipidemia -Continue Lipitor  GERD -Continue Protonix  Right arm pain Related to IV site. Resolved.  Elevated uric acid Patient started on allopurinol. No clinical evidence of gout. Also, with worsening renal function, not safe at current dose. Allopurinol discontinued. -Recheck uric acid as an outpatient  Depression Celexa reduced secondary to prolonged QTc -Continue Celexa 20 mg daily   DVT prophylaxis: Heparin IV (held for code stroke) Code Status:   Code Status: Full Code Family Communication: None at bedside Disposition Plan: Discharge pending improvement of AKI and ability to receive CABG   Consultants:  Cardiology Nephrology  Procedures:  Transthoracic Echocardiogram   Antimicrobials: None    Subjective: No issues this morning. Continues to require oxygen. Saddened by the realization that he will likely required hemodialysis.  Objective: BP 135/83   Pulse 91   Temp 97.8 F (36.6 C) (Oral)   Resp 17   Ht '5\' 10"'$  (1.778 m)   Wt 73.2 kg Comment: scale c  SpO2 98%   BMI 23.16 kg/m   Examination:  General exam: Appears calm and  comfortable Respiratory system: Bilateral rales. Respiratory effort normal.  Cardiovascular system: S1 & S2 heard, RRR. Gastrointestinal system: Abdomen is nondistended, soft and nontender. Normal bowel sounds heard. Central nervous system: Alert and oriented. No focal neurological deficits. Musculoskeletal: No edema. No calf tenderness Skin: No cyanosis. No rashes Psychiatry: Judgement and insight appear normal. Mood & affect appropriate.     Data Reviewed: I have personally reviewed following labs and imaging studies  CBC Lab Results  Component Value Date   WBC 8.7 06/19/2022   RBC 3.24 (L) 06/19/2022   HGB 9.7 (L) 06/19/2022   HCT 28.6 (L) 06/19/2022   MCV 88.3 06/19/2022   MCH 29.9 06/19/2022   PLT 192 06/19/2022   MCHC 33.9 06/19/2022   RDW 15.7 (H) 06/19/2022   LYMPHSABS 1.8 06/07/2022   MONOABS 0.6 06/07/2022   EOSABS 1.1 (H) 06/07/2022   BASOSABS 0.1 10/93/2355     Last metabolic panel Lab Results  Component Value Date   NA 138 06/19/2022   K 4.5 06/19/2022   CL 101 06/19/2022   CO2 22 06/19/2022   BUN 95 (H) 06/19/2022   CREATININE 8.45 (H) 06/19/2022   GLUCOSE 110 (H) 06/19/2022   GFRNONAA 6 (L) 06/19/2022   GFRAA 76 (L) 04/09/2014   CALCIUM 9.2 06/19/2022   PHOS 6.5 (H) 06/19/2022   PROT 7.3 04/09/2014   ALBUMIN 3.0 (L) 06/19/2022   BILITOT 0.3 04/09/2014   ALKPHOS 80 04/09/2014   AST 16 04/09/2014   ALT 10 04/09/2014   ANIONGAP 15 06/19/2022    GFR: Estimated Creatinine Clearance: 8.2 mL/min (A) (by C-G formula based on SCr of 8.45 mg/dL (H)).  Recent Results (from the past 240 hour(s))  SARS Coronavirus 2 by RT PCR (hospital order, performed in [facility] hospital lab) *cepheid single result test* Anterior Nasal Swab     Status: None   Collection Time: 06/12/22  5:58 AM   Specimen: Anterior Nasal Swab  Result Value Ref Range Status   SARS Coronavirus 2 by RT PCR NEGATIVE NEGATIVE Final    Comment: (NOTE) SARS-CoV-2 target nucleic acids are  NOT DETECTED.  The SARS-CoV-2 RNA is generally detectable in upper and lower respiratory specimens during the acute phase of infection. The lowest concentration of SARS-CoV-2 viral copies this assay can detect is 250 copies / mL. A negative result does not preclude SARS-CoV-2 infection and should not be used as the sole basis for treatment or other patient management decisions.  A negative result may occur with improper specimen collection / handling, submission of specimen other than nasopharyngeal swab, presence of viral mutation(s) within the areas targeted by this assay, and inadequate number of viral copies (<250 copies / mL). A negative result must be combined with clinical observations, patient history, and epidemiological information.  Fact Sheet for Patients:   https://www.patel.info/  Fact Sheet for Healthcare Providers: https://hall.com/  This test is not yet approved or  cleared by the [place] FDA and has been authorized for detection and/or diagnosis of SARS-CoV-2 by FDA under an Emergency Use Authorization (EUA).  This EUA will remain in effect (meaning this test can be used) for the duration of the COVID-19 declaration under Section 564(b)(1) of the Act, 21 U.S.C. section 360bbb-3(b)(1), unless the authorization is terminated or revoked sooner.  Performed at [facility], [place] [address], [place], [place] [address]       Radiology Studies: DG CHEST PORT 1 VIEW  Result Date: 06/19/2022 CLINICAL DATA:  Hypoxia EXAM: PORTABLE CHEST 1 VIEW COMPARISON:  Prior chest x-ray 06/18/2022 FINDINGS: Stable  cardiomegaly. Mediastinal contours are unchanged. Pulmonary vascular congestion is present with diffuse interstitial prominence. Overall, these findings are slightly improved compared to yesterday. Probable moderate layering right pleural effusion with associated right basilar atelectasis. No pneumothorax. IMPRESSION: 1.  Improving pulmonary edema. 2. Moderate layering right pleural effusion. 3. Extensive background chronic pulmonary parenchymal changes and bronchial wall thickening again noted. Electronically Signed   By: Jacqulynn Cadet M.D.   On: 06/19/2022 07:52   DG CHEST PORT 1 VIEW  Result Date: 06/18/2022 CLINICAL DATA:  Shortness of breath EXAM: PORTABLE CHEST 1 VIEW COMPARISON:  06/17/2022 FINDINGS: Transverse diameter of heart is increased. Central pulmonary vessels are prominent. Small right pleural effusion is seen. There are increased interstitial and alveolar markings in right lung. Left lateral CP angle is clear. There is no pneumothorax. IMPRESSION: Cardiomegaly. Increased interstitial and alveolar markings are seen in right lung which may suggest asymmetric pulmonary edema or multifocal pneumonia. Small right pleural effusion. Electronically Signed   By: Elmer Picker M.D.   On: 06/18/2022 12:49   DG CHEST PORT 1 VIEW  Result Date: 06/17/2022 CLINICAL DATA:  Short of breath EXAM: PORTABLE CHEST 1 VIEW COMPARISON:  06/12/2022 FINDINGS: Two frontal views of the chest demonstrate a stable cardiac silhouette. Chronic scarring and fibrotic changes are again noted, stable. No acute airspace disease, effusion, or pneumothorax. No acute bony abnormalities. IMPRESSION: 1. Chronic background scarring and fibrosis. No acute airspace disease. Electronically Signed   By: Randa Ngo M.D.   On: 06/17/2022 21:38      LOS: 7 days    Cordelia Poche, MD [facility] 06/19/2022, 10:45 AM   If 7PM-7AM, please contact night-coverage [email or web address]

## 2022-06-19 NOTE — Plan of Care (Signed)
  Problem: Tissue Perfusion: Goal: Adequacy of tissue perfusion will improve Outcome: Not Progressing   Problem: Clinical Measurements: Goal: Cardiovascular complication will be avoided Outcome: Not Progressing   Problem: Clinical Measurements: Goal: Respiratory complications will improve Outcome: Not Progressing

## 2022-06-19 NOTE — Progress Notes (Signed)
ANTICOAGULATION CONSULT NOTE - Follow-up Note  Pharmacy Consult for Heparin Indication: chest pain/ACS & LV thrombus  Allergies  Allergen Reactions   Dilaudid [Hydromorphone Hcl] Other (See Comments)    hallucinations   Mirapex [Pramipexole Dihydrochloride] Other (See Comments)    Leg pain   Prednisone Other (See Comments)    irritability    Patient Measurements: Height: '5\' 10"'$  (177.8 cm) Weight: 73.2 kg (161 lb 6 oz) (scale c) IBW/kg (Calculated) : 73 Heparin Dosing Weight: 75.8kg  Vital Signs: Temp: 97.8 F (36.6 C) (09/17 0739) Temp Source: Oral (09/17 0739) BP: 146/89 (09/17 0739) Pulse Rate: 93 (09/17 0310)  Labs: Recent Labs    06/17/22 0405 06/18/22 0346 06/19/22 0301  HGB 10.0* 10.2* 9.7*  HCT 30.2* 30.8* 28.6*  PLT 244 214 192  APTT 67* 70*  --   HEPARINUNFRC 0.56 0.43 0.34  CREATININE 7.37* 7.50* 8.45*    Estimated Creatinine Clearance: 8.2 mL/min (A) (by C-G formula based on SCr of 8.45 mg/dL (H)).   Medical History: Past Medical History:  Diagnosis Date   AAA (abdominal aortic aneurysm) (HCC)    Abdominal wall mass of left lower quadrant    Basal cell carcinoma    CAD (coronary artery disease) 2011   moderate   Colon polyp    DDD (degenerative disc disease)    Depression    Diabetes mellitus type II    no meds   GERD (gastroesophageal reflux disease)    Hyperlipidemia    Neuromuscular disorder (HCC)    Osteopenia    PAD (peripheral artery disease) (HCC)    Renal cell carcinoma    Restless leg syndrome    Sleep apnea    used a cpap yr ago-does not snore or use it now.    Medications:  Medications Prior to Admission  Medication Sig Dispense Refill Last Dose   amLODipine (NORVASC) 10 MG tablet Take 10 mg by mouth every evening.   06/11/2022   apixaban (ELIQUIS) 5 MG TABS tablet Take 5 mg by mouth 2 (two) times daily.   06/11/2022 at 2000   atorvastatin (LIPITOR) 40 MG tablet Take 40 mg by mouth at bedtime.   06/11/2022   citalopram  (CELEXA) 40 MG tablet Take 40 mg by mouth every evening.   06/11/2022   clonazePAM (KLONOPIN) 0.5 MG tablet Take 0.5 mg by mouth at bedtime.   06/11/2022   gabapentin (NEURONTIN) 300 MG capsule Take 300 mg by mouth 3 (three) times daily.   06/11/2022   lansoprazole (PREVACID) 15 MG capsule Take 15 mg by mouth every morning.   06/11/2022   nicotine (NICODERM CQ - DOSED IN MG/24 HOURS) 21 mg/24hr patch Place 21 mg onto the skin daily.   06/12/2022   QUEtiapine (SEROQUEL) 300 MG tablet Take 300 mg by mouth at bedtime.   06/11/2022    Scheduled:   amLODipine  10 mg Oral Daily   aspirin EC  81 mg Oral Daily   atorvastatin  40 mg Oral Daily   carvedilol  6.25 mg Oral BID WC   citalopram  20 mg Oral QPM   clonazePAM  0.5 mg Oral Daily   hydrALAZINE  25 mg Oral Q8H   insulin aspart  0-15 Units Subcutaneous TID WC   isosorbide mononitrate  15 mg Oral Daily   pantoprazole  40 mg Oral Daily   QUEtiapine  300 mg Oral QHS   sodium chloride flush  3 mL Intravenous Q12H   Infusions:   sodium chloride  heparin 1,350 Units/hr (06/19/22 0738)   PRN: sodium chloride, acetaminophen, ondansetron (ZOFRAN) IV, sodium chloride flush  Assessment: 15 yom with a 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 Patient presented on 06/12/22 with SOB. Patient was on apixaban prior to arrival. Last dose 9/9 at 2000. He is also noted with 2V CAD and for possible CABG. Deferring CABG for now until resolution of acute renal status. Heparin per pharmacy consult placed for chest pain/ACS and LV thrombus .  Heparin level 0.34 on drip rate 1350 units/hr therapeutic. Heparin levels continue to be therapeutic. Hgb 9.7 and plt 192. No problems with infusion per RN. No s/sx bleeding noted.  Goal of Therapy:  Heparin level 0.3-0.7 units/ml Monitor platelets by anticoagulation protocol: Yes   Plan:  Continue heparin infusion  at 1350 units/hr  Monitor daily heparin level and CBC Continue to monitor H&H     Gena Fray, PharmD PGY1 Pharmacy Resident   06/19/2022 8:09 AM  **Pharmacist phone directory can now be found on [email or web address] ([email or web address]).  Listed under [place].

## 2022-06-19 NOTE — Progress Notes (Signed)
[facility] Progress Note   Subjective:   Patient had some shortness of breath yesterday.  Chest x-ray with possible pulmonary edema versus pneumonia.  The patient states his shortness of breath is better today and he is not coughing up as much.  Unfortunately his creatinine continues to get worse up to 8.5 today.  Denies any nausea or vomiting but based on his trend I am concerned that he would need dialysis in the next 24 to 48 hours.  Discussed with patient and he understands  Objective Vitals:   06/19/22 0437 06/19/22 0556 06/19/22 0739 06/19/22 0936  BP:   (!) 146/89 135/83  Pulse:    91  Resp:   17   Temp:   97.8 F (36.6 C)   TempSrc:   Oral   SpO2:   98%   Weight: 74.1 kg 73.2 kg    Height:       Physical Exam General: In bed in no distress Heart: Normal rate, no rub Lungs: Bilateral chest rise with no increased work of breathing Abdomen: soft, nondistended Extremities: no edema, warm and well perfused Neuro: Minimal tremor, conversant and oriented  Additional Objective Labs: Basic Metabolic Panel: Recent Labs  Lab 06/17/22 0405 06/18/22 0346 06/19/22 0301  NA 140 139 138  K 4.0 4.4 4.5  CL 103 103 101  CO2 '22 23 22  '$ GLUCOSE 94 106* 110*  BUN 87* 87* 95*  CREATININE 7.37* 7.50* 8.45*  CALCIUM 9.2 9.3 9.2  PHOS 5.9* 6.3* 6.5*   Liver Function Tests: Recent Labs  Lab 06/17/22 0405 06/18/22 0346 06/19/22 0301  ALBUMIN 3.0* 3.0* 3.0*   No results for input(s): "LIPASE", "AMYLASE" in the last 168 hours. CBC: Recent Labs  Lab 06/15/22 0455 06/16/22 0320 06/17/22 0405 06/18/22 0346 06/19/22 0301  WBC 9.1 10.5 9.9 9.4 8.7  HGB 10.3* 10.2* 10.0* 10.2* 9.7*  HCT 29.9* 30.5* 30.2* 30.8* 28.6*  MCV 87.2 88.4 88.0 89.3 88.3  PLT 243 237 244 214 192   Blood Culture No results found for: "SDES", "SPECREQUEST", "CULT", "REPTSTATUS"  Cardiac Enzymes: No results for input(s): "CKTOTAL", "CKMB", "CKMBINDEX", "TROPONINI" in the last 168  hours.  CBG: Recent Labs  Lab 06/18/22 0615 06/18/22 1139 06/18/22 1625 06/18/22 2110 06/19/22 0544  GLUCAP 103* 104* 124* 116* 111*   Iron Studies: No results for input(s): "IRON", "TIBC", "TRANSFERRIN", "FERRITIN" in the last 72 hours. '@lablastinr3'$ @ Studies/Results: DG CHEST PORT 1 VIEW  Result Date: 06/19/2022 CLINICAL DATA:  Hypoxia EXAM: PORTABLE CHEST 1 VIEW COMPARISON:  Prior chest x-ray 06/18/2022 FINDINGS: Stable cardiomegaly. Mediastinal contours are unchanged. Pulmonary vascular congestion is present with diffuse interstitial prominence. Overall, these findings are slightly improved compared to yesterday. Probable moderate layering right pleural effusion with associated right basilar atelectasis. No pneumothorax. IMPRESSION: 1. Improving pulmonary edema. 2. Moderate layering right pleural effusion. 3. Extensive background chronic pulmonary parenchymal changes and bronchial wall thickening again noted. Electronically Signed   By: Jacqulynn Cadet M.D.   On: 06/19/2022 07:52   DG CHEST PORT 1 VIEW  Result Date: 06/18/2022 CLINICAL DATA:  Shortness of breath EXAM: PORTABLE CHEST 1 VIEW COMPARISON:  06/17/2022 FINDINGS: Transverse diameter of heart is increased. Central pulmonary vessels are prominent. Small right pleural effusion is seen. There are increased interstitial and alveolar markings in right lung. Left lateral CP angle is clear. There is no pneumothorax. IMPRESSION: Cardiomegaly. Increased interstitial and alveolar markings are seen in right lung which may suggest asymmetric pulmonary edema or multifocal pneumonia. Small right  pleural effusion. Electronically Signed   By: Elmer Picker M.D.   On: 06/18/2022 12:49   DG CHEST PORT 1 VIEW  Result Date: 06/17/2022 CLINICAL DATA:  Short of breath EXAM: PORTABLE CHEST 1 VIEW COMPARISON:  06/12/2022 FINDINGS: Two frontal views of the chest demonstrate a stable cardiac silhouette. Chronic scarring and fibrotic changes are  again noted, stable. No acute airspace disease, effusion, or pneumothorax. No acute bony abnormalities. IMPRESSION: 1. Chronic background scarring and fibrosis. No acute airspace disease. Electronically Signed   By: Randa Ngo M.D.   On: 06/17/2022 21:38   Medications:  sodium chloride     heparin 1,350 Units/hr (06/19/22 0738)    amLODipine  10 mg Oral Daily   aspirin EC  81 mg Oral Daily   atorvastatin  40 mg Oral Daily   carvedilol  6.25 mg Oral BID WC   citalopram  20 mg Oral QPM   clonazePAM  0.5 mg Oral Daily   hydrALAZINE  25 mg Oral Q8H   insulin aspart  0-15 Units Subcutaneous TID WC   isosorbide mononitrate  15 mg Oral Daily   pantoprazole  40 mg Oral Daily   QUEtiapine  300 mg Oral QHS   sodium chloride flush  3 mL Intravenous Q12H   Assessment/ Plan: AKI on CKD 3b - b/l creatinine 1.6 prior to recent heart cath. Creat here 5.0 now 3 wks after heart cath. The labs from 2 days post cath (creat 2.2) and from 9/5 in ED here (creat 3.3) are consistent w/ a typical post-contrast ATN / AKI pattern however cholesterol embolic injury is possible. Cholesterol embolic injury may not improve fully as CIN/ATN may.  C3 normal but minimal utility at this point.  Likely only time will tell. UA +small blood (none on microscopy) and 30 protein; FeNa c/w ATN.  Do not suspect GN. Renal US c/w CKD, simple cysts. He may have some mild uremic symptoms and also issues with oxygenation over the past 24 hours.  I will consult IR for tentative tunneled dialysis catheter placement tomorrow.  At the patient's creatinine was improving we could possibly cancel this procedure. Dialysis ordered for tomorrow after catheter placement Uncont HTN - getting amlodipine, hydralazine, coreg and imdur ordered and titrated by cardiology and BPs now good.  Would avoid hypotension.   CAD - sp LHC on 8/22 at [facility] as above; Cardiology following - think he'll need CABG ultimately.  Pre op testing hindered by AKI for now.  CT  surgery consulting.  A/C systolic CHF - echo with EF 30-35%.  Meds limited by kidney function.  Looks euvolemic today.  DM2 OSA Acute hypoxic resp failure -overall stable Tremor/myoclonic jerks -minimal issue at this time.  Off gabapentin

## 2022-06-20 ENCOUNTER — Inpatient Hospital Stay (HOSPITAL_COMMUNITY): Payer: No Typology Code available for payment source

## 2022-06-20 DIAGNOSIS — R4701 Aphasia: Secondary | ICD-10-CM | POA: Diagnosis not present

## 2022-06-20 DIAGNOSIS — I5031 Acute diastolic (congestive) heart failure: Secondary | ICD-10-CM | POA: Diagnosis not present

## 2022-06-20 DIAGNOSIS — N170 Acute kidney failure with tubular necrosis: Secondary | ICD-10-CM | POA: Diagnosis not present

## 2022-06-20 DIAGNOSIS — I5023 Acute on chronic systolic (congestive) heart failure: Secondary | ICD-10-CM | POA: Diagnosis not present

## 2022-06-20 HISTORY — PX: IR US GUIDE VASC ACCESS RIGHT: IMG2390

## 2022-06-20 HISTORY — PX: IR FLUORO GUIDE CV LINE RIGHT: IMG2283

## 2022-06-20 LAB — CBC
HCT: 30.2 % — ABNORMAL LOW (ref 39.0–52.0)
Hemoglobin: 10.3 g/dL — ABNORMAL LOW (ref 13.0–17.0)
MCH: 30.2 pg (ref 26.0–34.0)
MCHC: 34.1 g/dL (ref 30.0–36.0)
MCV: 88.6 fL (ref 80.0–100.0)
Platelets: 197 10*3/uL (ref 150–400)
RBC: 3.41 MIL/uL — ABNORMAL LOW (ref 4.22–5.81)
RDW: 15.8 % — ABNORMAL HIGH (ref 11.5–15.5)
WBC: 8.2 10*3/uL (ref 4.0–10.5)
nRBC: 0 % (ref 0.0–0.2)

## 2022-06-20 LAB — RENAL FUNCTION PANEL
Albumin: 3 g/dL — ABNORMAL LOW (ref 3.5–5.0)
Anion gap: 18 — ABNORMAL HIGH (ref 5–15)
BUN: 100 mg/dL — ABNORMAL HIGH (ref 8–23)
CO2: 21 mmol/L — ABNORMAL LOW (ref 22–32)
Calcium: 9.6 mg/dL (ref 8.9–10.3)
Chloride: 99 mmol/L (ref 98–111)
Creatinine, Ser: 9.26 mg/dL — ABNORMAL HIGH (ref 0.61–1.24)
GFR, Estimated: 6 mL/min — ABNORMAL LOW (ref >60)
Glucose, Bld: 135 mg/dL — ABNORMAL HIGH (ref 70–99)
Phosphorus: 6.2 mg/dL — ABNORMAL HIGH (ref 2.5–4.6)
Potassium: 4.5 mmol/L (ref 3.5–5.1)
Sodium: 138 mmol/L (ref 135–145)

## 2022-06-20 LAB — HEPARIN LEVEL (UNFRACTIONATED)
Heparin Unfractionated: <0.1 IU/mL — ABNORMAL LOW (ref 0.30–0.70)
Heparin Unfractionated: 0.29 IU/mL — ABNORMAL LOW (ref 0.30–0.70)

## 2022-06-20 LAB — GLUCOSE, CAPILLARY
Glucose-Capillary: 111 mg/dL — ABNORMAL HIGH (ref 70–99)
Glucose-Capillary: 152 mg/dL — ABNORMAL HIGH (ref 70–99)
Glucose-Capillary: 123 mg/dL — ABNORMAL HIGH (ref 70–99)
Glucose-Capillary: 127 mg/dL — ABNORMAL HIGH (ref 70–99)

## 2022-06-20 MED ORDER — HEPARIN SODIUM (PORCINE) 1000 UNIT/ML DIALYSIS
1000.0000 [IU] | INTRAMUSCULAR | Status: DC | PRN
  Administered 2022-06-20: 1000 [IU] via INTRAVENOUS_CENTRAL
  Filled 2022-06-20: qty 1

## 2022-06-20 MED ORDER — HEPARIN SODIUM (PORCINE) 1000 UNIT/ML IJ SOLN
INTRAMUSCULAR | Status: AC
  Filled 2022-06-20: qty 10

## 2022-06-20 MED ORDER — MIDAZOLAM HCL 2 MG/2ML IJ SOLN
INTRAMUSCULAR | Status: AC | PRN
  Administered 2022-06-20 (×2): .5 mg via INTRAVENOUS

## 2022-06-20 MED ORDER — LIDOCAINE-EPINEPHRINE 1 %-1:100000 IJ SOLN
INTRAMUSCULAR | Status: AC
  Filled 2022-06-20: qty 1

## 2022-06-20 MED ORDER — FUROSEMIDE 10 MG/ML IJ SOLN
40.0000 mg | Freq: Two times a day (BID) | INTRAMUSCULAR | Status: AC
  Administered 2022-06-20 (×2): 40 mg via INTRAVENOUS
  Filled 2022-06-20 (×2): qty 4

## 2022-06-20 MED ORDER — FENTANYL CITRATE (PF) 100 MCG/2ML IJ SOLN
INTRAMUSCULAR | Status: AC
  Filled 2022-06-20: qty 2

## 2022-06-20 MED ORDER — CEFAZOLIN SODIUM-DEXTROSE 2-4 GM/100ML-% IV SOLN
2.0000 g | Freq: Once | INTRAVENOUS | Status: AC
  Administered 2022-06-20: 2 g via INTRAVENOUS
  Filled 2022-06-20: qty 100

## 2022-06-20 MED ORDER — PENTAFLUOROPROP-TETRAFLUOROETH EX AERO: 1.0000 | INHALATION_SPRAY | CUTANEOUS | Status: DC | PRN

## 2022-06-20 MED ORDER — ALTEPLASE 2 MG IJ SOLR: 2.0000 mg | Freq: Once | INTRAMUSCULAR | Status: DC | PRN

## 2022-06-20 MED ORDER — CEFAZOLIN SODIUM-DEXTROSE 2-4 GM/100ML-% IV SOLN
2.0000 g | Freq: Once | INTRAVENOUS | Status: AC
  Filled 2022-06-20: qty 100

## 2022-06-20 MED ORDER — MIDAZOLAM HCL 2 MG/2ML IJ SOLN
INTRAMUSCULAR | Status: AC
  Filled 2022-06-20: qty 2

## 2022-06-20 MED ORDER — INSULIN ASPART 100 UNIT/ML IJ SOLN
0.0000 [IU] | Freq: Three times a day (TID) | INTRAMUSCULAR | Status: DC
  Administered 2022-06-22 – 2022-06-27 (×4): 2 [IU] via SUBCUTANEOUS

## 2022-06-20 MED ORDER — CEFAZOLIN SODIUM-DEXTROSE 2-4 GM/100ML-% IV SOLN
INTRAVENOUS | Status: AC
  Administered 2022-06-20: 2 g via INTRAVENOUS
  Filled 2022-06-20: qty 100

## 2022-06-20 MED ORDER — FENTANYL CITRATE (PF) 100 MCG/2ML IJ SOLN
INTRAMUSCULAR | Status: AC | PRN
  Administered 2022-06-20: 25 ug via INTRAVENOUS

## 2022-06-20 MED ORDER — LIDOCAINE HCL (PF) 1 % IJ SOLN: 5.0000 mL | INTRAMUSCULAR | Status: DC | PRN

## 2022-06-20 MED ORDER — LIDOCAINE-PRILOCAINE 2.5-2.5 % EX CREA: 1.0000 | TOPICAL_CREAM | CUTANEOUS | Status: DC | PRN

## 2022-06-20 MED ORDER — LIDOCAINE-EPINEPHRINE 1 %-1:100000 IJ SOLN
INTRAMUSCULAR | Status: AC | PRN
  Administered 2022-06-20: 10 mL

## 2022-06-20 NOTE — Progress Notes (Signed)
PROGRESS NOTE    Allen Cortez  OZH:[id number] DOB: 1951/01/25 DOA: 06/12/2022 PCP: Gerome Sam, MD   Brief Narrative: Allen Cortez is a 71 y.o. male with a history of CAD, AAA s/p repair, hypertension, hyperlipidemia, diabetes mellitus type 2, OSA, GERD, renal cell carcinoma s/p partial left nephrectomy, CKD stage IIIa, abdominal wall mass, depression, LV thrombus on Eliquis. Patient presented secondary to AKI s/p recent heart catheterization, concerning for contrast-induced nephropathy. Creatinine continues to worsen. Supportive care. During hospitalization, patient developed transient aphasia and dysarthria with associated headache in setting of heparin IV infusion; code stroke called which revealed evidence of no acute stroke.   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 Baseline creatinine of 1.6. Most recent creatinine of 3.34 5 days prior to admission. Creatinine of 5.07 on admission. Concern for contrast induced nephropathy from recent cardiac catheterization. Nephrology consulted. Patient challenged with Lasix and IV fluids, without improvement of AKI. Not oliguric. Creatinine continues to rise. IR consulted and placed a right Baxley on 9/18. -Nephrology recommendations: supportive care, HD scheduled for today -Daily RFP  CAD Cardiology consulted. Cardiac catheterization performed prior to admission on 8/29 and was significant for severe two vessel disease with recommendation for CABG. Cardiothoracic surgery consulted and are deferring CABG until AKI improved or patient develops evidence of ESRD requiring prolonged HD. -Continue Heparin IV -Continue Imdur  Aphasia/dysarthria Headache Acutely occurred this afternoon. Concern would be hemorrhagic stroke in setting of Heparin IV. NIH of 2 for aphasia and dysarthria. Code stroke called on 9/13. CT head without evidence of acute stroke/hemorrhage. MRI/MRA without evidence of acute stroke. 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 Neurology without further recommendations. Symptoms resolved.  Acute respiratory failure with hypoxia Acute pulmonary edema Initially secondary to acute heart failure and resolved with diuresis. Recurrent hypoxia with oxygen requirement up to 6 L/min. Possibly secondary to recently administered IV fluids in setting of known heart failure and AKI. BNP of 1,453. Lasix given x1 on 9/16 with some improvement in oxygen requirement. Repeat chest x-ray significant for improvement in  pulmonary edema -Continue oxygen supplementation and wean as able  Acute on chronic systolic heart failure LVEF of 30-35% with akinesis of LV and apical segments, in addition to severe hypokinesis of LV, basal-mid anterior wall. Patient is not on ACEi/ARB therapy secondary to AKI. -Continue Coreg  LV mural thrombus Noted on Transthoracic Echocardiogram.  AAA History of aortic aneurysm repair.  Diabetes mellitus, type 2 -Continue SSI  Sleep apnea -Continue CPAP  Hyperlipidemia -Continue Lipitor  GERD -Continue Protonix  Right arm pain Related to IV site. Resolved.  Elevated uric acid Patient started on allopurinol. No clinical evidence of gout. Also, with worsening renal function, not safe at current dose. Allopurinol discontinued. -Recheck uric acid as an outpatient  Depression Celexa reduced secondary to prolonged QTc -Continue Celexa 20 mg daily   DVT prophylaxis: Heparin IV Code Status:   Code Status: Full Code Family Communication: None at bedside Disposition Plan: Discharge pending nephrology recommendations for AKI/HD and ability to receive CABG   Consultants:  Cardiology Nephrology  Procedures:  Transthoracic Echocardiogram Tunneled HD line placement  Antimicrobials: None    Subjective: No issues overnight. No dyspnea this morning.  Objective: BP (!) 151/88   Pulse 92   Temp 97.8 F (36.6 C) (Oral)   Resp (!) 23   Ht '5\' 10"'$  (1.778  m)   Wt 73.1 kg Comment: scale c  SpO2 92%   BMI 23.13 kg/m  Examination:  General exam: Appears calm and comfortable Respiratory system: Rales. Respiratory effort normal. Cardiovascular system: S1 & S2 heard, RRR. No murmurs, rubs, gallops or clicks. Gastrointestinal system: Abdomen is nondistended, soft and nontender. Normal bowel sounds heard. Central nervous system: Alert and oriented. No focal neurological deficits. Musculoskeletal: No edema. No calf tenderness Skin: No cyanosis. No rashes Psychiatry: Judgement and insight appear normal. Mood & affect appropriate.     Data Reviewed: I have personally reviewed following labs and imaging studies  CBC Lab Results  Component Value Date   WBC 8.2 06/20/2022   RBC 3.41 (L) 06/20/2022   HGB 10.3 (L) 06/20/2022   HCT 30.2 (L) 06/20/2022   MCV 88.6 06/20/2022   MCH 30.2 06/20/2022   PLT 197 06/20/2022   MCHC 34.1 06/20/2022   RDW 15.8 (H) 06/20/2022   LYMPHSABS 1.8 06/07/2022   MONOABS 0.6 06/07/2022   EOSABS 1.1 (H) 06/07/2022   BASOSABS 0.1 95/06/3266     Last metabolic panel Lab Results  Component Value Date   NA 138 06/20/2022   K 4.5 06/20/2022   CL 99 06/20/2022   CO2 21 (L) 06/20/2022   BUN 100 (H) 06/20/2022   CREATININE 9.26 (H) 06/20/2022   GLUCOSE 135 (H) 06/20/2022   GFRNONAA 6 (L) 06/20/2022   GFRAA 76 (L) 04/09/2014   CALCIUM 9.6 06/20/2022   PHOS 6.2 (H) 06/20/2022   PROT 7.3 04/09/2014   ALBUMIN 3.0 (L) 06/20/2022   BILITOT 0.3 04/09/2014   ALKPHOS 80 04/09/2014   AST 16 04/09/2014   ALT 10 04/09/2014   ANIONGAP 18 (H) 06/20/2022    GFR: Estimated Creatinine Clearance: 7.4 mL/min (A) (by C-G formula based on SCr of 9.26 mg/dL (H)).  Recent Results (from the past 240 hour(s))  SARS Coronavirus 2 by RT PCR (hospital order, performed in [facility] hospital lab) *cepheid single result test* Anterior Nasal Swab     Status: None   Collection Time: 06/12/22  5:58 AM   Specimen: Anterior  Nasal Swab  Result Value Ref Range Status   SARS Coronavirus 2 by RT PCR NEGATIVE NEGATIVE Final    Comment: (NOTE) SARS-CoV-2 target nucleic acids are NOT DETECTED.  The SARS-CoV-2 RNA is generally detectable in upper and lower respiratory specimens during the acute phase of infection. The lowest concentration of SARS-CoV-2 viral copies this assay can detect is 250 copies / mL. A negative result does not preclude SARS-CoV-2 infection and should not be used as the sole basis for treatment or other patient management decisions.  A negative result may occur with improper specimen collection / handling, submission of specimen other than nasopharyngeal swab, presence of viral mutation(s) within the areas targeted by this assay, and inadequate number of viral copies (<250 copies / mL). A negative result must be combined with clinical observations, patient history, and epidemiological information.  Fact Sheet for Patients:   https://www.patel.info/  Fact Sheet for Healthcare Providers: https://hall.com/  This test is not yet approved or  cleared by the [place] FDA and has been authorized for detection and/or diagnosis of SARS-CoV-2 by FDA under an Emergency Use Authorization (EUA).  This EUA will remain in effect (meaning this test can be used) for the duration of the COVID-19 declaration under Section 564(b)(1) of the Act, 21 U.S.C. section 360bbb-3(b)(1), unless the authorization is terminated or revoked sooner.  Performed at [facility], [place] [address], [place], [place] [address]       Radiology Studies: IR Fluoro Guide CV  Line Right  Result Date: 06/20/2022 INDICATION: AKI EXAM: Ultrasound-guided puncture of the right internal jugular vein Placement of a tunneled hemodialysis catheter using fluoroscopic guidance MEDICATIONS: Per EMR ANESTHESIA/SEDATION: Moderate (conscious) sedation was employed during this procedure. A  total of Versed 1 mg and Fentanyl 25 mcg was administered intravenously. Moderate Sedation Time: 25 minutes. The patient's level of consciousness and vital signs were monitored continuously by radiology nursing throughout the procedure under my direct supervision. FLUOROSCOPY TIME:  Fluoroscopy Time: 1.6 minutes (10 mGy) COMPLICATIONS: None immediate. PROCEDURE: Informed written consent was obtained from the patient after a thorough discussion of the procedural risks, benefits and alternatives. All questions were addressed. Maximal Sterile Barrier Technique was utilized including caps, mask, sterile gowns, sterile gloves, sterile drape, hand hygiene and skin antiseptic. A timeout was performed prior to the initiation of the procedure. The patient was placed supine on the exam table. The right neck and chest was prepped and draped in the standard sterile fashion. Ultrasound was used to evaluate the right internal jugular vein, which found to be patent and suitable for access. An ultrasound image was permanently stored in the electronic medical record. Using ultrasound guidance, the right internal jugular vein was directly punctured using a 21 gauge micropuncture set. An 018 wire was advanced into the SVC, followed by serial tract dilation and placement of an 035 wire which was advanced into the IVC. Attention was then turned to an infraclavicular location, where a small dermatotomy was made after the administration of additional local anesthetic. From this location, a 19 cm tip-to-cuff hemodialysis catheter was tunneled to the venotomy site. A peel-away sheath was then advanced over the access wire. Through this peel-away sheath, the hemodialysis catheter was advanced into the central veins, such that the tip was positioned near the superior cavoatrial junction. The line was found to flush and aspirate appropriately. It was sutured to the skin using 0 silk suture, and the venotomy was closed with 4-0 Vicryl and  Dermabond. A sterile dressing was placed. The patient tolerated the procedure well without immediate complication. IMPRESSION: Successful placement of a tunneled hemodialysis catheter via the right internal jugular vein. The line is ready for immediate use. Electronically Signed   By: Albin Felling M.D.   On: 06/20/2022 09:23   IR US Guide Vasc Access Right  Result Date: 06/20/2022 INDICATION: AKI EXAM: Ultrasound-guided puncture of the right internal jugular vein Placement of a tunneled hemodialysis catheter using fluoroscopic guidance MEDICATIONS: Per EMR ANESTHESIA/SEDATION: Moderate (conscious) sedation was employed during this procedure. A total of Versed 1 mg and Fentanyl 25 mcg was administered intravenously. Moderate Sedation Time: 25 minutes. The patient's level of consciousness and vital signs were monitored continuously by radiology nursing throughout the procedure under my direct supervision. FLUOROSCOPY TIME:  Fluoroscopy Time: 1.6 minutes (10 mGy) COMPLICATIONS: None immediate. PROCEDURE: Informed written consent was obtained from the patient after a thorough discussion of the procedural risks, benefits and alternatives. All questions were addressed. Maximal Sterile Barrier Technique was utilized including caps, mask, sterile gowns, sterile gloves, sterile drape, hand hygiene and skin antiseptic. A timeout was performed prior to the initiation of the procedure. The patient was placed supine on the exam table. The right neck and chest was prepped and draped in the standard sterile fashion. Ultrasound was used to evaluate the right internal jugular vein, which found to be patent and suitable for access. An ultrasound image was permanently stored in the electronic medical record. Using ultrasound guidance, the right internal jugular vein  was directly punctured using a 21 gauge micropuncture set. An 018 wire was advanced into the SVC, followed by serial tract dilation and placement of an 035 wire  which was advanced into the IVC. Attention was then turned to an infraclavicular location, where a small dermatotomy was made after the administration of additional local anesthetic. From this location, a 19 cm tip-to-cuff hemodialysis catheter was tunneled to the venotomy site. A peel-away sheath was then advanced over the access wire. Through this peel-away sheath, the hemodialysis catheter was advanced into the central veins, such that the tip was positioned near the superior cavoatrial junction. The line was found to flush and aspirate appropriately. It was sutured to the skin using 0 silk suture, and the venotomy was closed with 4-0 Vicryl and Dermabond. A sterile dressing was placed. The patient tolerated the procedure well without immediate complication. IMPRESSION: Successful placement of a tunneled hemodialysis catheter via the right internal jugular vein. The line is ready for immediate use. Electronically Signed   By: Albin Felling M.D.   On: 06/20/2022 09:23   DG CHEST PORT 1 VIEW  Result Date: 06/19/2022 CLINICAL DATA:  Hypoxia EXAM: PORTABLE CHEST 1 VIEW COMPARISON:  Prior chest x-ray 06/18/2022 FINDINGS: Stable cardiomegaly. Mediastinal contours are unchanged. Pulmonary vascular congestion is present with diffuse interstitial prominence. Overall, these findings are slightly improved compared to yesterday. Probable moderate layering right pleural effusion with associated right basilar atelectasis. No pneumothorax. IMPRESSION: 1. Improving pulmonary edema. 2. Moderate layering right pleural effusion. 3. Extensive background chronic pulmonary parenchymal changes and bronchial wall thickening again noted. Electronically Signed   By: Jacqulynn Cadet M.D.   On: 06/19/2022 07:52      LOS: 8 days    Cordelia Poche, MD [facility] 06/20/2022, 12:50 PM   If 7PM-7AM, please contact night-coverage [email or web address]

## 2022-06-20 NOTE — Progress Notes (Signed)
ANTICOAGULATION CONSULT NOTE - Follow-up Note  Pharmacy Consult for Heparin Indication: chest pain/ACS & LV thrombus  Allergies  Allergen Reactions   Dilaudid [Hydromorphone Hcl] Other (See Comments)    hallucinations   Mirapex [Pramipexole Dihydrochloride] Other (See Comments)    Leg pain   Prednisone Other (See Comments)    irritability    Patient Measurements: Height: '5\' 10"'$  (177.8 cm) Weight: 73.1 kg (161 lb 3.2 oz) (scale c) IBW/kg (Calculated) : 73 Heparin Dosing Weight: 75.8kg  Vital Signs: Temp: 97.8 F (36.6 C) (09/18 0407) Temp Source: Oral (09/18 0407) BP: 151/88 (09/18 0900) Pulse Rate: 92 (09/18 0900)  Labs: Recent Labs    06/18/22 0346 06/19/22 0301  HGB 10.2* 9.7*  HCT 30.8* 28.6*  PLT 214 192  APTT 70*  --   HEPARINUNFRC 0.43 0.34  CREATININE 7.50* 8.45*     Estimated Creatinine Clearance: 8.2 mL/min (A) (by C-G formula based on SCr of 8.45 mg/dL (H)).   Medical History: Past Medical History:  Diagnosis Date   AAA (abdominal aortic aneurysm) (HCC)    Abdominal wall mass of left lower quadrant    Basal cell carcinoma    CAD (coronary artery disease) 2011   moderate   Colon polyp    DDD (degenerative disc disease)    Depression    Diabetes mellitus type II    no meds   GERD (gastroesophageal reflux disease)    Hyperlipidemia    Neuromuscular disorder (HCC)    Osteopenia    PAD (peripheral artery disease) (HCC)    Renal cell carcinoma    Restless leg syndrome    Sleep apnea    used a cpap yr ago-does not snore or use it now.    Medications:  Medications Prior to Admission  Medication Sig Dispense Refill Last Dose   amLODipine (NORVASC) 10 MG tablet Take 10 mg by mouth every evening.   06/11/2022   apixaban (ELIQUIS) 5 MG TABS tablet Take 5 mg by mouth 2 (two) times daily.   06/11/2022 at 2000   atorvastatin (LIPITOR) 40 MG tablet Take 40 mg by mouth at bedtime.   06/11/2022   citalopram (CELEXA) 40 MG tablet Take 40 mg by mouth every  evening.   06/11/2022   clonazePAM (KLONOPIN) 0.5 MG tablet Take 0.5 mg by mouth at bedtime.   06/11/2022   gabapentin (NEURONTIN) 300 MG capsule Take 300 mg by mouth 3 (three) times daily.   06/11/2022   lansoprazole (PREVACID) 15 MG capsule Take 15 mg by mouth every morning.   06/11/2022   nicotine (NICODERM CQ - DOSED IN MG/24 HOURS) 21 mg/24hr patch Place 21 mg onto the skin daily.   06/12/2022   QUEtiapine (SEROQUEL) 300 MG tablet Take 300 mg by mouth at bedtime.   06/11/2022    Scheduled:   amLODipine  10 mg Oral Daily   aspirin EC  81 mg Oral Daily   atorvastatin  40 mg Oral Daily   carvedilol  6.25 mg Oral BID WC   Chlorhexidine Gluconate Cloth  6 each Topical Q0600   citalopram  20 mg Oral QPM   clonazePAM  0.5 mg Oral Daily   furosemide  40 mg Intravenous Q12H   hydrALAZINE  25 mg Oral Q8H   insulin aspart  0-15 Units Subcutaneous TID WC   isosorbide mononitrate  15 mg Oral Daily   pantoprazole  40 mg Oral Daily   QUEtiapine  300 mg Oral QHS   sodium chloride flush  3  mL Intravenous Q12H   Infusions:   sodium chloride      ceFAZolin (ANCEF) IV 2 g (06/20/22 1007)   heparin 1,350 Units/hr (06/20/22 1010)   PRN: sodium chloride, acetaminophen, alteplase, heparin, lidocaine (PF), lidocaine-prilocaine, ondansetron (ZOFRAN) IV, pentafluoroprop-tetrafluoroeth, sodium chloride flush  Assessment: 18 yom with a 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 Patient presented on 06/12/22 with SOB. Patient was on apixaban prior to arrival. Last dose 9/9 at 2000. He is also noted with 2V CAD and for possible CABG. Deferring CABG for now until resolution of acute renal status. Heparin per pharmacy consult placed for chest pain/ACS and LV thrombus .  -heparin stopped this morning for tunneled HD cath placement and to be restarted this morning  Goal of Therapy:  Heparin level 0.3-0.7  units/ml Monitor platelets by anticoagulation protocol: Yes   Plan:  Continue heparin infusion at 1350 units/hr  Check heparin level in 8 hrs.  Hildred Laser, PharmD Clinical Pharmacist **Pharmacist phone directory can now be found on [email or web address] ([email or web address]).  Listed under [place].

## 2022-06-20 NOTE — Progress Notes (Signed)
Received patient in bed to unit.  Alert and oriented.  Informed consent signed and in chart.   Treatment initiated: 3893 Treatment completed: 7342  Patient tolerated well.  Transported back to the room  Alert, without acute distress.  Hand-off given to patient's nurse.   Access used: catheter Access issues: none  Total UF removed: 500 Medication(s) given: none Post HD VS: 157/87 P 92 R 20 Post HD weight: 72.5 kg   [facility]

## 2022-06-20 NOTE — Progress Notes (Signed)
[facility] Progress Note   Subjective:   Pt is s/p TDC this AM-  planning for first HD today -  did have 1100 of UOP but no new labs-  he reports still SOB but no nausea   Objective Vitals:   06/20/22 0845 06/20/22 0850 06/20/22 0855 06/20/22 0900  BP: (!) 143/86 (!) 145/87 (!) 151/88 (!) 151/88  Pulse: 92 89 90 92  Resp: 17 20 (!) 25 (!) 23  Temp:      TempSrc:      SpO2: 91% 92% 92% 92%  Weight:      Height:       Physical Exam General: In bed in no distress Heart: Normal rate, no rub Lungs: Bilateral chest rise with no increased work of breathing Abdomen: soft, nondistended Extremities: no edema, warm and well perfused Neuro: Minimal tremor, conversant and oriented  Additional Objective Labs: Basic Metabolic Panel: Recent Labs  Lab 06/17/22 0405 06/18/22 0346 06/19/22 0301  NA 140 139 138  K 4.0 4.4 4.5  CL 103 103 101  CO2 '22 23 22  '$ GLUCOSE 94 106* 110*  BUN 87* 87* 95*  CREATININE 7.37* 7.50* 8.45*  CALCIUM 9.2 9.3 9.2  PHOS 5.9* 6.3* 6.5*   Liver Function Tests: Recent Labs  Lab 06/17/22 0405 06/18/22 0346 06/19/22 0301  ALBUMIN 3.0* 3.0* 3.0*   No results for input(s): "LIPASE", "AMYLASE" in the last 168 hours. CBC: Recent Labs  Lab 06/15/22 0455 06/16/22 0320 06/17/22 0405 06/18/22 0346 06/19/22 0301  WBC 9.1 10.5 9.9 9.4 8.7  HGB 10.3* 10.2* 10.0* 10.2* 9.7*  HCT 29.9* 30.5* 30.2* 30.8* 28.6*  MCV 87.2 88.4 88.0 89.3 88.3  PLT 243 237 244 214 192   Blood Culture No results found for: "SDES", "SPECREQUEST", "CULT", "REPTSTATUS"  Cardiac Enzymes: No results for input(s): "CKTOTAL", "CKMB", "CKMBINDEX", "TROPONINI" in the last 168 hours.  CBG: Recent Labs  Lab 06/19/22 0544 06/19/22 1126 06/19/22 1709 06/19/22 2125 06/20/22 0640  GLUCAP 111* 103* 161* 77 111*   Iron Studies: No results for input(s): "IRON", "TIBC", "TRANSFERRIN", "FERRITIN" in the last 72 hours. '@[email or web address]'$ @ Studies/Results: IR Fluoro Guide  CV Line Right  Result Date: 06/20/2022 INDICATION: AKI EXAM: Ultrasound-guided puncture of the right internal jugular vein Placement of a tunneled hemodialysis catheter using fluoroscopic guidance MEDICATIONS: Per EMR ANESTHESIA/SEDATION: Moderate (conscious) sedation was employed during this procedure. A total of Versed 1 mg and Fentanyl 25 mcg was administered intravenously. Moderate Sedation Time: 25 minutes. The patient's level of consciousness and vital signs were monitored continuously by radiology nursing throughout the procedure under my direct supervision. FLUOROSCOPY TIME:  Fluoroscopy Time: 1.6 minutes (10 mGy) COMPLICATIONS: None immediate. PROCEDURE: Informed written consent was obtained from the patient after a thorough discussion of the procedural risks, benefits and alternatives. All questions were addressed. Maximal Sterile Barrier Technique was utilized including caps, mask, sterile gowns, sterile gloves, sterile drape, hand hygiene and skin antiseptic. A timeout was performed prior to the initiation of the procedure. The patient was placed supine on the exam table. The right neck and chest was prepped and draped in the standard sterile fashion. Ultrasound was used to evaluate the right internal jugular vein, which found to be patent and suitable for access. An ultrasound image was permanently stored in the electronic medical record. Using ultrasound guidance, the right internal jugular vein was directly punctured using a 21 gauge micropuncture set. An 018 wire was advanced into the SVC, followed by serial tract dilation and placement of  an 035 wire which was advanced into the IVC. Attention was then turned to an infraclavicular location, where a small dermatotomy was made after the administration of additional local anesthetic. From this location, a 19 cm tip-to-cuff hemodialysis catheter was tunneled to the venotomy site. A peel-away sheath was then advanced over the access wire. Through this  peel-away sheath, the hemodialysis catheter was advanced into the central veins, such that the tip was positioned near the superior cavoatrial junction. The line was found to flush and aspirate appropriately. It was sutured to the skin using 0 silk suture, and the venotomy was closed with 4-0 Vicryl and Dermabond. A sterile dressing was placed. The patient tolerated the procedure well without immediate complication. IMPRESSION: Successful placement of a tunneled hemodialysis catheter via the right internal jugular vein. The line is ready for immediate use. Electronically Signed   By: Albin Felling M.D.   On: 06/20/2022 09:23   IR US Guide Vasc Access Right  Result Date: 06/20/2022 INDICATION: AKI EXAM: Ultrasound-guided puncture of the right internal jugular vein Placement of a tunneled hemodialysis catheter using fluoroscopic guidance MEDICATIONS: Per EMR ANESTHESIA/SEDATION: Moderate (conscious) sedation was employed during this procedure. A total of Versed 1 mg and Fentanyl 25 mcg was administered intravenously. Moderate Sedation Time: 25 minutes. The patient's level of consciousness and vital signs were monitored continuously by radiology nursing throughout the procedure under my direct supervision. FLUOROSCOPY TIME:  Fluoroscopy Time: 1.6 minutes (10 mGy) COMPLICATIONS: None immediate. PROCEDURE: Informed written consent was obtained from the patient after a thorough discussion of the procedural risks, benefits and alternatives. All questions were addressed. Maximal Sterile Barrier Technique was utilized including caps, mask, sterile gowns, sterile gloves, sterile drape, hand hygiene and skin antiseptic. A timeout was performed prior to the initiation of the procedure. The patient was placed supine on the exam table. The right neck and chest was prepped and draped in the standard sterile fashion. Ultrasound was used to evaluate the right internal jugular vein, which found to be patent and suitable for  access. An ultrasound image was permanently stored in the electronic medical record. Using ultrasound guidance, the right internal jugular vein was directly punctured using a 21 gauge micropuncture set. An 018 wire was advanced into the SVC, followed by serial tract dilation and placement of an 035 wire which was advanced into the IVC. Attention was then turned to an infraclavicular location, where a small dermatotomy was made after the administration of additional local anesthetic. From this location, a 19 cm tip-to-cuff hemodialysis catheter was tunneled to the venotomy site. A peel-away sheath was then advanced over the access wire. Through this peel-away sheath, the hemodialysis catheter was advanced into the central veins, such that the tip was positioned near the superior cavoatrial junction. The line was found to flush and aspirate appropriately. It was sutured to the skin using 0 silk suture, and the venotomy was closed with 4-0 Vicryl and Dermabond. A sterile dressing was placed. The patient tolerated the procedure well without immediate complication. IMPRESSION: Successful placement of a tunneled hemodialysis catheter via the right internal jugular vein. The line is ready for immediate use. Electronically Signed   By: Albin Felling M.D.   On: 06/20/2022 09:23   DG CHEST PORT 1 VIEW  Result Date: 06/19/2022 CLINICAL DATA:  Hypoxia EXAM: PORTABLE CHEST 1 VIEW COMPARISON:  Prior chest x-ray 06/18/2022 FINDINGS: Stable cardiomegaly. Mediastinal contours are unchanged. Pulmonary vascular congestion is present with diffuse interstitial prominence. Overall, these findings are slightly improved  compared to yesterday. Probable moderate layering right pleural effusion with associated right basilar atelectasis. No pneumothorax. IMPRESSION: 1. Improving pulmonary edema. 2. Moderate layering right pleural effusion. 3. Extensive background chronic pulmonary parenchymal changes and bronchial wall thickening again  noted. Electronically Signed   By: Jacqulynn Cadet M.D.   On: 06/19/2022 07:52   DG CHEST PORT 1 VIEW  Result Date: 06/18/2022 CLINICAL DATA:  Shortness of breath EXAM: PORTABLE CHEST 1 VIEW COMPARISON:  06/17/2022 FINDINGS: Transverse diameter of heart is increased. Central pulmonary vessels are prominent. Small right pleural effusion is seen. There are increased interstitial and alveolar markings in right lung. Left lateral CP angle is clear. There is no pneumothorax. IMPRESSION: Cardiomegaly. Increased interstitial and alveolar markings are seen in right lung which may suggest asymmetric pulmonary edema or multifocal pneumonia. Small right pleural effusion. Electronically Signed   By: Elmer Picker M.D.   On: 06/18/2022 12:49   Medications:  sodium chloride      ceFAZolin (ANCEF) IV     heparin 1,350 Units/hr (06/20/22 0059)    amLODipine  10 mg Oral Daily   aspirin EC  81 mg Oral Daily   atorvastatin  40 mg Oral Daily   carvedilol  6.25 mg Oral BID WC   Chlorhexidine Gluconate Cloth  6 each Topical Q0600   citalopram  20 mg Oral QPM   clonazePAM  0.5 mg Oral Daily   hydrALAZINE  25 mg Oral Q8H   insulin aspart  0-15 Units Subcutaneous TID WC   isosorbide mononitrate  15 mg Oral Daily   pantoprazole  40 mg Oral Daily   QUEtiapine  300 mg Oral QHS   sodium chloride flush  3 mL Intravenous Q12H   Assessment/ Plan: AKI on CKD 3b - b/l creatinine 1.6 prior to recent heart cath. Creat here 5.0 now 3 wks after heart cath. The labs from 2 days post cath (creat 2.2) and from 9/5 in ED here (creat 3.3) are consistent w/ a typical post-contrast ATN / AKI pattern however cholesterol embolic injury is possible. Cholesterol embolic injury may not improve fully as CIN/ATN may.  C3 normal but minimal utility at this point.  Likely only time will tell. UA +small blood (none on microscopy) and 30 protein; FeNa c/w ATN.  Do not suspect GN. Renal US c/w CKD, simple cysts. He may have some mild  uremic symptoms and also issues with oxygenation over the past 24 hours.  S/p  tunneled dialysis catheter placement and  Dialysis ordered for today mostly for clearance-  just a little UF-  then will look to do PRN based on labs and UOP  Uncont HTN - getting amlodipine, hydralazine, coreg and imdur ordered and titrated by cardiology and BPs now good.  Volume may be playing a role as well-  look to dec antihypertensives once volume is better.  Since made such good urine will also add diuretic -  need to watch for hypotension -  will put admin parameter on hydralazine  CAD - sp LHC on 8/22 at [facility] as above; Cardiology following - think he'll need CABG ultimately.  Pre op testing hindered by AKI for now.  CT surgery consulting.  A/C systolic CHF - echo with EF 30-35%.  Meds limited by kidney function.  Looks euvolemic today.   Acute hypoxic resp failure -overall stable Tremor/myoclonic jerks -minimal issue at this time.  Off gabapentin

## 2022-06-20 NOTE — Progress Notes (Signed)
Heart Failure Navigator Progress Note  Assessed for Heart & Vascular TOC clinic readiness.  Patient does not meet criteria due to creat 8.45 Hemodialysis ordered to start. HD cath placed.  Earnestine Leys, BSN, RN Heart Failure Transport planner Only

## 2022-06-20 NOTE — Progress Notes (Signed)
   06/20/22 1555  Clinical Encounter Type  Visited With Patient not available;Health care provider Scot Dock, RN)  Visit Type Initial  Referral From Physician Hansel Starling, MD)  Consult/Referral To Chaplain Melvenia Beam)  Recommendations [facility].   Spiritual Care Consultation Request: "[facility]." Attempted visit - Patient Gone to Dialysis. [address] [place], [place] [phone]

## 2022-06-20 NOTE — Procedures (Signed)
Interventional Radiology Procedure Note  Date of Procedure: 06/20/2022  Procedure: Tunneled HD line placement   Findings:  1. Tunneled HD line placement, Right IJ 19cm tip-to-cuff    Complications: No immediate complications noted.   Estimated Blood Loss: minimal  Follow-up and Recommendations: 1. Ready for use    Albin Felling, MD  Vascular & Interventional Radiology  06/20/2022 9:20 AM

## 2022-06-20 NOTE — Progress Notes (Signed)
Mobility Specialist Progress Note:   06/20/22 1102  Mobility  Activity Ambulated with assistance in room  Level of Assistance Contact guard assist, steadying assist  Assistive Device None  Distance Ambulated (ft) 10 ft  Activity Response Tolerated well  $Mobility charge 1 Mobility   Pt received in bed asking to get up to sink to wash up. No complaints of pain. Left at sink with all needs met.   [facility] Surveyor, mining Chat only

## 2022-06-21 DIAGNOSIS — I25118 Atherosclerotic heart disease of native coronary artery with other forms of angina pectoris: Secondary | ICD-10-CM | POA: Diagnosis not present

## 2022-06-21 DIAGNOSIS — E785 Hyperlipidemia, unspecified: Secondary | ICD-10-CM | POA: Diagnosis not present

## 2022-06-21 DIAGNOSIS — N170 Acute kidney failure with tubular necrosis: Secondary | ICD-10-CM | POA: Diagnosis not present

## 2022-06-21 LAB — GLUCOSE, CAPILLARY
Glucose-Capillary: 104 mg/dL — ABNORMAL HIGH (ref 70–99)
Glucose-Capillary: 111 mg/dL — ABNORMAL HIGH (ref 70–99)
Glucose-Capillary: 120 mg/dL — ABNORMAL HIGH (ref 70–99)
Glucose-Capillary: 92 mg/dL (ref 70–99)

## 2022-06-21 LAB — HEPATITIS B SURFACE ANTIBODY, QUANTITATIVE: Hep B S AB Quant (Post): <3.1 m[IU]/mL — ABNORMAL LOW (ref >9.9)

## 2022-06-21 LAB — CBC
HCT: 28.3 % — ABNORMAL LOW (ref 39.0–52.0)
Hemoglobin: 9.6 g/dL — ABNORMAL LOW (ref 13.0–17.0)
MCH: 29.8 pg (ref 26.0–34.0)
MCHC: 33.9 g/dL (ref 30.0–36.0)
MCV: 87.9 fL (ref 80.0–100.0)
Platelets: 184 10*3/uL (ref 150–400)
RBC: 3.22 MIL/uL — ABNORMAL LOW (ref 4.22–5.81)
RDW: 15.7 % — ABNORMAL HIGH (ref 11.5–15.5)
WBC: 9.4 10*3/uL (ref 4.0–10.5)
nRBC: 0 % (ref 0.0–0.2)

## 2022-06-21 LAB — RENAL FUNCTION PANEL
Albumin: 2.8 g/dL — ABNORMAL LOW (ref 3.5–5.0)
Anion gap: 17 — ABNORMAL HIGH (ref 5–15)
BUN: 68 mg/dL — ABNORMAL HIGH (ref 8–23)
CO2: 23 mmol/L (ref 22–32)
Calcium: 9 mg/dL (ref 8.9–10.3)
Chloride: 98 mmol/L (ref 98–111)
Creatinine, Ser: 6.91 mg/dL — ABNORMAL HIGH (ref 0.61–1.24)
GFR, Estimated: 8 mL/min — ABNORMAL LOW (ref >60)
Glucose, Bld: 102 mg/dL — ABNORMAL HIGH (ref 70–99)
Phosphorus: 5.5 mg/dL — ABNORMAL HIGH (ref 2.5–4.6)
Potassium: 4.2 mmol/L (ref 3.5–5.1)
Sodium: 138 mmol/L (ref 135–145)

## 2022-06-21 LAB — HEPARIN LEVEL (UNFRACTIONATED)
Heparin Unfractionated: 0.2 IU/mL — ABNORMAL LOW (ref 0.30–0.70)
Heparin Unfractionated: 0.26 IU/mL — ABNORMAL LOW (ref 0.30–0.70)

## 2022-06-21 MED ORDER — FUROSEMIDE 10 MG/ML IJ SOLN
40.0000 mg | Freq: Two times a day (BID) | INTRAMUSCULAR | Status: AC
  Administered 2022-06-21 (×2): 40 mg via INTRAVENOUS
  Filled 2022-06-21 (×2): qty 4

## 2022-06-21 MED ORDER — POLYETHYLENE GLYCOL 3350 17 G PO PACK
17.0000 g | PACK | Freq: Every day | ORAL | Status: DC
  Filled 2022-06-21 (×3): qty 1

## 2022-06-21 NOTE — Progress Notes (Addendum)
PROGRESS NOTE    MCCRAE SPECIALE  CZY:[id number] DOB: March 11, 1951 DOA: 06/12/2022 PCP: Gerome Sam, MD   Brief Narrative: Allen Cortez is a 71 y.o. male with a history of CAD, AAA s/p repair, hypertension, hyperlipidemia, diabetes mellitus type 2, OSA, GERD, renal cell carcinoma s/p partial left nephrectomy, CKD stage IIIa, abdominal wall mass, depression, LV thrombus on Eliquis. Patient presented secondary to AKI s/p recent heart catheterization, concerning for contrast-induced nephropathy. Creatinine continues to worsen. Supportive care. During hospitalization, patient developed transient aphasia and dysarthria with associated headache in setting of heparin IV infusion; code stroke called which revealed evidence of no acute stroke.   Assessment and Plan:  AKI on CKD stage IIIa History of partial nephrectomy. Baseline creatinine of 1.6. Most recent creatinine of 3.34 5 days prior to admission. Creatinine of 5.07 on admission. Concern for contrast induced nephropathy from recent cardiac catheterization. Nephrology consulted. Patient challenged with Lasix and IV fluids, without improvement of AKI. Not oliguric. Creatinine continues to rise. IR consulted and placed a right [place] on 9/18. -Nephrology recommendations: supportive care, HD initiated on 9/18 -Daily RFP -Strict in/out  CAD Cardiology consulted. Cardiac catheterization performed prior to admission on 8/29 and was significant for severe two vessel disease with recommendation for CABG. Cardiothoracic surgery consulted and are deferring CABG until AKI improved or patient develops evidence of ESRD requiring prolonged HD. -Continue Heparin IV -Continue Imdur  Aphasia/dysarthria Headache Acutely occurred this afternoon. Concern would be hemorrhagic stroke in setting of Heparin IV. NIH of 2 for aphasia and dysarthria. Code stroke called on 9/13. CT head without evidence of acute stroke/hemorrhage. MRI/MRA without evidence  of acute stroke. 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 Neurology without further recommendations. Symptoms resolved.  Acute respiratory failure with hypoxia Acute pulmonary edema Initially secondary to acute heart failure and resolved with diuresis. Recurrent hypoxia with oxygen requirement up to 6 L/min. Possibly secondary to recently administered IV fluids in setting of known heart failure and AKI. BNP of 1,453. Lasix given x1 on 9/16 with some improvement in oxygen requirement. Repeat chest x-ray significant for improvement in  pulmonary edema. Improvement of oxygen need after HD to 3 L/min -Continue oxygen supplementation and wean as able  Acute on chronic systolic heart failure LVEF of 30-35% with akinesis of LV and apical segments, in addition to severe hypokinesis of LV, basal-mid anterior wall. Patient is not on ACEi/ARB therapy secondary to AKI. -Continue Coreg  LV mural thrombus Noted on Transthoracic Echocardiogram.  AAA History of aortic aneurysm repair.  Diabetes mellitus, type 2 -Continue SSI  Sleep apnea -Continue CPAP  Hyperlipidemia -Continue Lipitor  GERD -Continue Protonix  Right arm pain Related to IV site. Resolved.  Elevated uric acid Patient started on allopurinol. No clinical evidence of gout. Also, with worsening renal function, not safe at current dose. Allopurinol discontinued. -Recheck uric acid as an outpatient  Depression Celexa reduced secondary to prolonged QTc -Continue Celexa 20 mg daily   DVT prophylaxis: Heparin IV Code Status:   Code Status: Full Code Family Communication: None at bedside Disposition Plan: Discharge pending nephrology recommendations for AKI/HD and ability to receive CABG   Consultants:  Cardiology Nephrology  Procedures:  Transthoracic Echocardiogram Tunneled HD line placement Hemodialysis (9/18 >>  Antimicrobials: None    Subjective: No issues documented overnight.  Objective: BP 127/86  (BP Location: Left Arm)   Pulse 90   Temp 97.9 F (36.6 C) (Oral)   Resp 20   Ht '5\' 10"'$  (1.778 m)  Wt 72.5 kg   SpO2 95%   BMI 22.93 kg/m   Examination:  General exam: Appears calm and comfortable Respiratory system: Respiratory effort normal. Gastrointestinal system: Abdomen is distended   Data Reviewed: I have personally reviewed following labs and imaging studies  CBC Lab Results  Component Value Date   WBC 9.4 06/21/2022   RBC 3.22 (L) 06/21/2022   HGB 9.6 (L) 06/21/2022   HCT 28.3 (L) 06/21/2022   MCV 87.9 06/21/2022   MCH 29.8 06/21/2022   PLT 184 06/21/2022   MCHC 33.9 06/21/2022   RDW 15.7 (H) 06/21/2022   LYMPHSABS 1.8 06/07/2022   MONOABS 0.6 06/07/2022   EOSABS 1.1 (H) 06/07/2022   BASOSABS 0.1 37/85/8850     Last metabolic panel Lab Results  Component Value Date   NA 138 06/21/2022   K 4.2 06/21/2022   CL 98 06/21/2022   CO2 23 06/21/2022   BUN 68 (H) 06/21/2022   CREATININE 6.91 (H) 06/21/2022   GLUCOSE 102 (H) 06/21/2022   GFRNONAA 8 (L) 06/21/2022   GFRAA 76 (L) 04/09/2014   CALCIUM 9.0 06/21/2022   PHOS 5.5 (H) 06/21/2022   PROT 7.3 04/09/2014   ALBUMIN 2.8 (L) 06/21/2022   BILITOT 0.3 04/09/2014   ALKPHOS 80 04/09/2014   AST 16 04/09/2014   ALT 10 04/09/2014   ANIONGAP 17 (H) 06/21/2022    GFR: Estimated Creatinine Clearance: 9.9 mL/min (A) (by C-G formula based on SCr of 6.91 mg/dL (H)).  Recent Results (from the past 240 hour(s))  SARS Coronavirus 2 by RT PCR (hospital order, performed in [facility] hospital lab) *cepheid single result test* Anterior Nasal Swab     Status: None   Collection Time: 06/12/22  5:58 AM   Specimen: Anterior Nasal Swab  Result Value Ref Range Status   SARS Coronavirus 2 by RT PCR NEGATIVE NEGATIVE Final    Comment: (NOTE) SARS-CoV-2 target nucleic acids are NOT DETECTED.  The SARS-CoV-2 RNA is generally detectable in upper and lower respiratory specimens during the acute phase of infection. The  lowest concentration of SARS-CoV-2 viral copies this assay can detect is 250 copies / mL. A negative result does not preclude SARS-CoV-2 infection and should not be used as the sole basis for treatment or other patient management decisions.  A negative result may occur with improper specimen collection / handling, submission of specimen other than nasopharyngeal swab, presence of viral mutation(s) within the areas targeted by this assay, and inadequate number of viral copies (<250 copies / mL). A negative result must be combined with clinical observations, patient history, and epidemiological information.  Fact Sheet for Patients:   https://www.patel.info/  Fact Sheet for Healthcare Providers: https://hall.com/  This test is not yet approved or  cleared by the [place] FDA and has been authorized for detection and/or diagnosis of SARS-CoV-2 by FDA under an Emergency Use Authorization (EUA).  This EUA will remain in effect (meaning this test can be used) for the duration of the COVID-19 declaration under Section 564(b)(1) of the Act, 21 U.S.C. section 360bbb-3(b)(1), unless the authorization is terminated or revoked sooner.  Performed at [facility], [place] [address] [place], [place] [address]       Radiology Studies: IR Fluoro Guide CV Line Right  Result Date: 06/20/2022 INDICATION: AKI EXAM: Ultrasound-guided puncture of the right internal jugular vein Placement of a tunneled hemodialysis catheter using fluoroscopic guidance MEDICATIONS: Per EMR ANESTHESIA/SEDATION: Moderate (conscious) sedation was employed during this procedure. A total of  Versed 1 mg and Fentanyl 25 mcg was administered intravenously. Moderate Sedation Time: 25 minutes. The patient's level of consciousness and vital signs were monitored continuously by radiology nursing throughout the procedure under my direct supervision. FLUOROSCOPY TIME:  Fluoroscopy Time:  1.6 minutes (10 mGy) COMPLICATIONS: None immediate. PROCEDURE: Informed written consent was obtained from the patient after a thorough discussion of the procedural risks, benefits and alternatives. All questions were addressed. Maximal Sterile Barrier Technique was utilized including caps, mask, sterile gowns, sterile gloves, sterile drape, hand hygiene and skin antiseptic. A timeout was performed prior to the initiation of the procedure. The patient was placed supine on the exam table. The right neck and chest was prepped and draped in the standard sterile fashion. Ultrasound was used to evaluate the right internal jugular vein, which found to be patent and suitable for access. An ultrasound image was permanently stored in the electronic medical record. Using ultrasound guidance, the right internal jugular vein was directly punctured using a 21 gauge micropuncture set. An 018 wire was advanced into the SVC, followed by serial tract dilation and placement of an 035 wire which was advanced into the IVC. Attention was then turned to an infraclavicular location, where a small dermatotomy was made after the administration of additional local anesthetic. From this location, a 19 cm tip-to-cuff hemodialysis catheter was tunneled to the venotomy site. A peel-away sheath was then advanced over the access wire. Through this peel-away sheath, the hemodialysis catheter was advanced into the central veins, such that the tip was positioned near the superior cavoatrial junction. The line was found to flush and aspirate appropriately. It was sutured to the skin using 0 silk suture, and the venotomy was closed with 4-0 Vicryl and Dermabond. A sterile dressing was placed. The patient tolerated the procedure well without immediate complication. IMPRESSION: Successful placement of a tunneled hemodialysis catheter via the right internal jugular vein. The line is ready for immediate use. Electronically Signed   By: Albin Felling M.D.    On: 06/20/2022 09:23   IR US Guide Vasc Access Right  Result Date: 06/20/2022 INDICATION: AKI EXAM: Ultrasound-guided puncture of the right internal jugular vein Placement of a tunneled hemodialysis catheter using fluoroscopic guidance MEDICATIONS: Per EMR ANESTHESIA/SEDATION: Moderate (conscious) sedation was employed during this procedure. A total of Versed 1 mg and Fentanyl 25 mcg was administered intravenously. Moderate Sedation Time: 25 minutes. The patient's level of consciousness and vital signs were monitored continuously by radiology nursing throughout the procedure under my direct supervision. FLUOROSCOPY TIME:  Fluoroscopy Time: 1.6 minutes (10 mGy) COMPLICATIONS: None immediate. PROCEDURE: Informed written consent was obtained from the patient after a thorough discussion of the procedural risks, benefits and alternatives. All questions were addressed. Maximal Sterile Barrier Technique was utilized including caps, mask, sterile gowns, sterile gloves, sterile drape, hand hygiene and skin antiseptic. A timeout was performed prior to the initiation of the procedure. The patient was placed supine on the exam table. The right neck and chest was prepped and draped in the standard sterile fashion. Ultrasound was used to evaluate the right internal jugular vein, which found to be patent and suitable for access. An ultrasound image was permanently stored in the electronic medical record. Using ultrasound guidance, the right internal jugular vein was directly punctured using a 21 gauge micropuncture set. An 018 wire was advanced into the SVC, followed by serial tract dilation and placement of an 035 wire which was advanced into the IVC. Attention was then turned to an infraclavicular  location, where a small dermatotomy was made after the administration of additional local anesthetic. From this location, a 19 cm tip-to-cuff hemodialysis catheter was tunneled to the venotomy site. A peel-away sheath was then  advanced over the access wire. Through this peel-away sheath, the hemodialysis catheter was advanced into the central veins, such that the tip was positioned near the superior cavoatrial junction. The line was found to flush and aspirate appropriately. It was sutured to the skin using 0 silk suture, and the venotomy was closed with 4-0 Vicryl and Dermabond. A sterile dressing was placed. The patient tolerated the procedure well without immediate complication. IMPRESSION: Successful placement of a tunneled hemodialysis catheter via the right internal jugular vein. The line is ready for immediate use. Electronically Signed   By: Albin Felling M.D.   On: 06/20/2022 09:23      LOS: 9 days    Cordelia Poche, MD [facility] 06/21/2022, 12:17 PM   If 7PM-7AM, please contact night-coverage [email or web address]

## 2022-06-21 NOTE — Progress Notes (Signed)
   06/21/22 1400  Clinical Encounter Type  Visited With Patient and family together  Visit Type Initial;Spiritual support  Referral From Physician;Nurse (Dr. Deetta Perla, MD; Julianne Rice. Chrisandra Carota, RN)  Consult/Referral To Chaplain Albertina Parr Morene Crocker)   Chaplain had the pleasure to meet with [name]. Allen "Tommy" and his wife/ [name] at patient's bedside. [name] briefly shared the circumstances which led to his hospitalization here at [facility]. He acknowledges the severity of his CAD and and sudden onset of renal failure following his recent cardiac catheterization at [facility]. He is very optimistic about his prognosis even though he realizes that it is life changing.  Mr. and Mrs. Ebel shared their Darrick Meigs faith beliefs, and both are firmly grounded in the promise of God's grace, and find comfort and peace knowing that God is in control of his healing.  [name] shared that his son, [name], II is also a Theme park manager and provides him with spiritual nourishment. [name] shared that her husband is a role model to many young families in their community and an advocate for other veterans. His life is enriched through his loving family and caring friends and neighbors. [address] [place], [place] [phone]

## 2022-06-21 NOTE — Plan of Care (Signed)
  Problem: Education: Goal: Individualized Educational Video(s) Outcome: Progressing   Problem: Coping: Goal: Ability to adjust to condition or change in health will improve Outcome: Progressing   Problem: Fluid Volume: Goal: Ability to maintain a balanced intake and output will improve Outcome: Progressing   

## 2022-06-21 NOTE — Progress Notes (Signed)
   06/21/22 1238  Mobility  Activity Transferred from chair to bed  Level of Assistance Modified independent, requires aide device or extra time  Distance Ambulated (ft) 4 ft  Activity Response Tolerated well  $Mobility charge 1 Mobility   Mobility Specialist Progress Note  Pt in chair and agreeable to get back to bed. Left w/ all needs met and call bell in reach.   Lucious Groves Mobility Specialist

## 2022-06-21 NOTE — Progress Notes (Signed)
ANTICOAGULATION CONSULT NOTE  Pharmacy Consult for Heparin Indication: chest pain/ACS & LV thrombus Brief A/P: Heparin level subtherapeutic Increase Heparin rate  Allergies  Allergen Reactions   Dilaudid [Hydromorphone Hcl] Other (See Comments)    hallucinations   Mirapex [Pramipexole Dihydrochloride] Other (See Comments)    Leg pain   Prednisone Other (See Comments)    irritability    Patient Measurements: Height: '5\' 10"'$  (177.8 cm) Weight: 72.5 kg (159 lb 13.3 oz) IBW/kg (Calculated) : 73 Heparin Dosing Weight: 75.8kg  Vital Signs: Temp: 97.9 F (36.6 C) (09/18 2001) Temp Source: Oral (09/18 2001) BP: 154/91 (09/18 2001) Pulse Rate: 103 (09/18 1910)  Labs: Recent Labs    06/19/22 0301 06/20/22 1005 06/20/22 1853 06/21/22 0338  HGB 9.7* 10.3*  --  9.6*  HCT 28.6* 30.2*  --  28.3*  PLT 192 197  --  184  HEPARINUNFRC 0.34 <0.10* 0.29* 0.20*  CREATININE 8.45* 9.26*  --  6.91*     Estimated Creatinine Clearance: 9.9 mL/min (A) (by C-G formula based on SCr of 6.91 mg/dL (H)).  Assessment: 71 y.o. male with ACS and h/o LV thrombus, Eliquis on hold, for heparin  Goal of Therapy:  Heparin level 0.3-0.7 units/ml Monitor platelets by anticoagulation protocol: Yes   Plan:  Increase Heparin 1450 units/hr Check heparin level in 8 hours.  Phillis Knack, PharmD, BCPS

## 2022-06-21 NOTE — Progress Notes (Signed)
[facility] Progress Note   Subjective:   Had first HD yest-  removed 0.5 liters-  labs reflective of HD-  also had 925 of UOP  Objective Vitals:   06/20/22 1910 06/20/22 2001 06/21/22 0626 06/21/22 0738  BP: (!) 139/99 (!) 154/91 (!) 152/92 134/88  Pulse: (!) 103  92 100  Resp: (!) 24 20 (!) 28 20  Temp: 97.9 F (36.6 C) 97.9 F (36.6 C) 97.9 F (36.6 C)   TempSrc: Oral Oral Oral   SpO2: 97% 95% (!) 88% 95%  Weight:   72.5 kg   Height:       Physical Exam General: In bed in no distress-  chronically ill appearing Heart: Normal rate, no rub Lungs: is on side-  crackles on dep side Abdomen: soft, nondistended Extremities: no edema, warm and well perfused Neuro: Minimal tremor, conversant and oriented  Additional Objective Labs: Basic Metabolic Panel: Recent Labs  Lab 06/19/22 0301 06/20/22 1005 06/21/22 0338  NA 138 138 138  K 4.5 4.5 4.2  CL 101 99 98  CO2 22 21* 23  GLUCOSE 110* 135* 102*  BUN 95* 100* 68*  CREATININE 8.45* 9.26* 6.91*  CALCIUM 9.2 9.6 9.0  PHOS 6.5* 6.2* 5.5*   Liver Function Tests: Recent Labs  Lab 06/19/22 0301 06/20/22 1005 06/21/22 0338  ALBUMIN 3.0* 3.0* 2.8*   No results for input(s): "LIPASE", "AMYLASE" in the last 168 hours. CBC: Recent Labs  Lab 06/17/22 0405 06/18/22 0346 06/19/22 0301 06/20/22 1005 06/21/22 0338  WBC 9.9 9.4 8.7 8.2 9.4  HGB 10.0* 10.2* 9.7* 10.3* 9.6*  HCT 30.2* 30.8* 28.6* 30.2* 28.3*  MCV 88.0 89.3 88.3 88.6 87.9  PLT 244 214 192 197 184   Blood Culture No results found for: "SDES", "SPECREQUEST", "CULT", "REPTSTATUS"  Cardiac Enzymes: No results for input(s): "CKTOTAL", "CKMB", "CKMBINDEX", "TROPONINI" in the last 168 hours.  CBG: Recent Labs  Lab 06/20/22 0640 06/20/22 1136 06/20/22 1900 06/20/22 2137 06/21/22 0624  GLUCAP 111* 152* 123* 127* 111*   Iron Studies: No results for input(s): "IRON", "TIBC", "TRANSFERRIN", "FERRITIN" in the last 72  hours. '@[email or web address]'$ @ Studies/Results: IR Fluoro Guide CV Line Right  Result Date: 06/20/2022 INDICATION: AKI EXAM: Ultrasound-guided puncture of the right internal jugular vein Placement of a tunneled hemodialysis catheter using fluoroscopic guidance MEDICATIONS: Per EMR ANESTHESIA/SEDATION: Moderate (conscious) sedation was employed during this procedure. A total of Versed 1 mg and Fentanyl 25 mcg was administered intravenously. Moderate Sedation Time: 25 minutes. The patient's level of consciousness and vital signs were monitored continuously by radiology nursing throughout the procedure under my direct supervision. FLUOROSCOPY TIME:  Fluoroscopy Time: 1.6 minutes (10 mGy) COMPLICATIONS: None immediate. PROCEDURE: Informed written consent was obtained from the patient after a thorough discussion of the procedural risks, benefits and alternatives. All questions were addressed. Maximal Sterile Barrier Technique was utilized including caps, mask, sterile gowns, sterile gloves, sterile drape, hand hygiene and skin antiseptic. A timeout was performed prior to the initiation of the procedure. The patient was placed supine on the exam table. The right neck and chest was prepped and draped in the standard sterile fashion. Ultrasound was used to evaluate the right internal jugular vein, which found to be patent and suitable for access. An ultrasound image was permanently stored in the electronic medical record. Using ultrasound guidance, the right internal jugular vein was directly punctured using a 21 gauge micropuncture set. An 018 wire was advanced into the SVC, followed by serial tract dilation and placement  of an 035 wire which was advanced into the IVC. Attention was then turned to an infraclavicular location, where a small dermatotomy was made after the administration of additional local anesthetic. From this location, a 19 cm tip-to-cuff hemodialysis catheter was tunneled to the venotomy site. A peel-away  sheath was then advanced over the access wire. Through this peel-away sheath, the hemodialysis catheter was advanced into the central veins, such that the tip was positioned near the superior cavoatrial junction. The line was found to flush and aspirate appropriately. It was sutured to the skin using 0 silk suture, and the venotomy was closed with 4-0 Vicryl and Dermabond. A sterile dressing was placed. The patient tolerated the procedure well without immediate complication. IMPRESSION: Successful placement of a tunneled hemodialysis catheter via the right internal jugular vein. The line is ready for immediate use. Electronically Signed   By: Albin Felling M.D.   On: 06/20/2022 09:23   IR US Guide Vasc Access Right  Result Date: 06/20/2022 INDICATION: AKI EXAM: Ultrasound-guided puncture of the right internal jugular vein Placement of a tunneled hemodialysis catheter using fluoroscopic guidance MEDICATIONS: Per EMR ANESTHESIA/SEDATION: Moderate (conscious) sedation was employed during this procedure. A total of Versed 1 mg and Fentanyl 25 mcg was administered intravenously. Moderate Sedation Time: 25 minutes. The patient's level of consciousness and vital signs were monitored continuously by radiology nursing throughout the procedure under my direct supervision. FLUOROSCOPY TIME:  Fluoroscopy Time: 1.6 minutes (10 mGy) COMPLICATIONS: None immediate. PROCEDURE: Informed written consent was obtained from the patient after a thorough discussion of the procedural risks, benefits and alternatives. All questions were addressed. Maximal Sterile Barrier Technique was utilized including caps, mask, sterile gowns, sterile gloves, sterile drape, hand hygiene and skin antiseptic. A timeout was performed prior to the initiation of the procedure. The patient was placed supine on the exam table. The right neck and chest was prepped and draped in the standard sterile fashion. Ultrasound was used to evaluate the right internal  jugular vein, which found to be patent and suitable for access. An ultrasound image was permanently stored in the electronic medical record. Using ultrasound guidance, the right internal jugular vein was directly punctured using a 21 gauge micropuncture set. An 018 wire was advanced into the SVC, followed by serial tract dilation and placement of an 035 wire which was advanced into the IVC. Attention was then turned to an infraclavicular location, where a small dermatotomy was made after the administration of additional local anesthetic. From this location, a 19 cm tip-to-cuff hemodialysis catheter was tunneled to the venotomy site. A peel-away sheath was then advanced over the access wire. Through this peel-away sheath, the hemodialysis catheter was advanced into the central veins, such that the tip was positioned near the superior cavoatrial junction. The line was found to flush and aspirate appropriately. It was sutured to the skin using 0 silk suture, and the venotomy was closed with 4-0 Vicryl and Dermabond. A sterile dressing was placed. The patient tolerated the procedure well without immediate complication. IMPRESSION: Successful placement of a tunneled hemodialysis catheter via the right internal jugular vein. The line is ready for immediate use. Electronically Signed   By: Albin Felling M.D.   On: 06/20/2022 09:23   Medications:  sodium chloride     heparin 1,450 Units/hr (06/21/22 0511)    amLODipine  10 mg Oral Daily   aspirin EC  81 mg Oral Daily   atorvastatin  40 mg Oral Daily   carvedilol  6.25 mg  Oral BID WC   Chlorhexidine Gluconate Cloth  6 each Topical Q0600   citalopram  20 mg Oral QPM   clonazePAM  0.5 mg Oral Daily   hydrALAZINE  25 mg Oral Q8H   insulin aspart  0-15 Units Subcutaneous TID WC   isosorbide mononitrate  15 mg Oral Daily   pantoprazole  40 mg Oral Daily   QUEtiapine  300 mg Oral QHS   sodium chloride flush  3 mL Intravenous Q12H   Assessment/ Plan: AKI on CKD  3b - b/l creatinine 1.6 prior to recent heart cath. Creat here 5.0  3 wks after heart cath. The labs from 2 days post cath (creat 2.2) and from 9/5 in ED here (creat 3.3) are not consistent w/ a typical post-contrast ATN / AKI pattern however cholesterol embolic injury is possible. Cholesterol embolic injury may not improve fully as CIN/ATN may.  C3.  Likely only time will tell. UA +small blood (none on microscopy) and 30 protein; FeNa c/w ATN.  Do not suspect GN. Renal US c/w CKD, simple cysts. He had mild uremic symptoms and also issues with oxygenation so decision made to support with HD.  S/p  tunneled dialysis catheter placement and  first HD on 9/18-  now will look to do PRN based on labs and UOP-  not today   Uncont HTN - getting amlodipine, hydralazine, coreg and imdur titrated by cardiology and BPs now fine.   Volume may be playing a role as well-  look to dec antihypertensives once volume is better.  Since made such good urine also added diuretic -  need to watch for hypotension -  will put admin parameter on hydralazine  CAD - sp LHC on 8/22 at [facility] as above; Cardiology following - think he'll need CABG ultimately.  Pre op testing hindered by AKI for now.  CT surgery consulting.  A/C systolic CHF - echo with EF 30-35%.  Meds limited by kidney function.    Acute hypoxic resp failure -overall stable Tremor/myoclonic jerks -minimal issue at this time.  Off gabapentin Anemia-  not too much of an issue   Allen Cortez

## 2022-06-21 NOTE — Progress Notes (Addendum)
Rounding Note    Patient Name: Allen Cortez Date of Encounter: 06/21/2022  [place] Cardiologist: None   Subjective   No chest pain. Lots on his mind. Had first HD session yesterday. Says it was rough.   Inpatient Medications    Scheduled Meds:  amLODipine  10 mg Oral Daily   aspirin EC  81 mg Oral Daily   atorvastatin  40 mg Oral Daily   carvedilol  6.25 mg Oral BID WC   Chlorhexidine Gluconate Cloth  6 each Topical Q0600   citalopram  20 mg Oral QPM   clonazePAM  0.5 mg Oral Daily   furosemide  40 mg Intravenous Q12H   hydrALAZINE  25 mg Oral Q8H   insulin aspart  0-15 Units Subcutaneous TID WC   isosorbide mononitrate  15 mg Oral Daily   pantoprazole  40 mg Oral Daily   QUEtiapine  300 mg Oral QHS   sodium chloride flush  3 mL Intravenous Q12H   Continuous Infusions:  sodium chloride     heparin 1,450 Units/hr (06/21/22 0511)   PRN Meds: sodium chloride, acetaminophen, ondansetron (ZOFRAN) IV, sodium chloride flush   Vital Signs    Vitals:   06/21/22 0738 06/21/22 0845 06/21/22 0950 06/21/22 0951  BP: 134/88 (!) 143/86 (!) 144/92   Pulse: 100   90  Resp: 20     Temp:      TempSrc:      SpO2: 95%     Weight:      Height:        Intake/Output Summary (Last 24 hours) at 06/21/2022 1031 Last data filed at 06/21/2022 0900 Gross per 24 hour  Intake 724.47 ml  Output 1725 ml  Net -1000.53 ml      06/21/2022    6:26 AM 06/20/2022    6:32 PM 06/20/2022    3:45 PM  Last 3 Weights  Weight (lbs) 159 lb 13.3 oz 159 lb 13.3 oz 160 lb 15 oz  Weight (kg) 72.5 kg 72.5 kg 73 kg      Telemetry    Sinus Rhythm - Personally Reviewed  ECG    No new tracing  Physical Exam   GEN: No acute distress.   Neck: No JVD Cardiac: RRR, no murmurs, rubs, or gallops.  Respiratory: Clear to auscultation bilaterally. HD cath to right upper chest GI: Soft, nontender, non-distended  MS: No edema; No deformity. Neuro:  Nonfocal  Psych: Normal affect    Labs    High Sensitivity Troponin:   Recent Labs  Lab 06/12/22 0326 06/12/22 0530  TROPONINIHS 232* 219*     Chemistry Recent Labs  Lab 06/19/22 0301 06/20/22 1005 06/21/22 0338  NA 138 138 138  K 4.5 4.5 4.2  CL 101 99 98  CO2 22 21* 23  GLUCOSE 110* 135* 102*  BUN 95* 100* 68*  CREATININE 8.45* 9.26* 6.91*  CALCIUM 9.2 9.6 9.0  ALBUMIN 3.0* 3.0* 2.8*  GFRNONAA 6* 6* 8*  ANIONGAP 15 18* 17*    Lipids  Recent Labs  Lab 06/15/22 0455  CHOL 113  TRIG 137  HDL 26*  LDLCALC 60  CHOLHDL 4.3    Hematology Recent Labs  Lab 06/19/22 0301 06/20/22 1005 06/21/22 0338  WBC 8.7 8.2 9.4  RBC 3.24* 3.41* 3.22*  HGB 9.7* 10.3* 9.6*  HCT 28.6* 30.2* 28.3*  MCV 88.3 88.6 87.9  MCH 29.9 30.2 29.8  MCHC 33.9 34.1 33.9  RDW 15.7* 15.8* 15.7*  PLT  192 197 184   Thyroid No results for input(s): "TSH", "FREET4" in the last 168 hours.  BNP Recent Labs  Lab 06/19/22 0301  BNP 1,453.6*    DDimer No results for input(s): "DDIMER" in the last 168 hours.   Radiology    IR Fluoro Guide CV Line Right  Result Date: 06/20/2022 INDICATION: AKI EXAM: Ultrasound-guided puncture of the right internal jugular vein Placement of a tunneled hemodialysis catheter using fluoroscopic guidance MEDICATIONS: Per EMR ANESTHESIA/SEDATION: Moderate (conscious) sedation was employed during this procedure. A total of Versed 1 mg and Fentanyl 25 mcg was administered intravenously. Moderate Sedation Time: 25 minutes. The patient's level of consciousness and vital signs were monitored continuously by radiology nursing throughout the procedure under my direct supervision. FLUOROSCOPY TIME:  Fluoroscopy Time: 1.6 minutes (10 mGy) COMPLICATIONS: None immediate. PROCEDURE: Informed written consent was obtained from the patient after a thorough discussion of the procedural risks, benefits and alternatives. All questions were addressed. Maximal Sterile Barrier Technique was utilized including caps, mask,  sterile gowns, sterile gloves, sterile drape, hand hygiene and skin antiseptic. A timeout was performed prior to the initiation of the procedure. The patient was placed supine on the exam table. The right neck and chest was prepped and draped in the standard sterile fashion. Ultrasound was used to evaluate the right internal jugular vein, which found to be patent and suitable for access. An ultrasound image was permanently stored in the electronic medical record. Using ultrasound guidance, the right internal jugular vein was directly punctured using a 21 gauge micropuncture set. An 018 wire was advanced into the SVC, followed by serial tract dilation and placement of an 035 wire which was advanced into the IVC. Attention was then turned to an infraclavicular location, where a small dermatotomy was made after the administration of additional local anesthetic. From this location, a 19 cm tip-to-cuff hemodialysis catheter was tunneled to the venotomy site. A peel-away sheath was then advanced over the access wire. Through this peel-away sheath, the hemodialysis catheter was advanced into the central veins, such that the tip was positioned near the superior cavoatrial junction. The line was found to flush and aspirate appropriately. It was sutured to the skin using 0 silk suture, and the venotomy was closed with 4-0 Vicryl and Dermabond. A sterile dressing was placed. The patient tolerated the procedure well without immediate complication. IMPRESSION: Successful placement of a tunneled hemodialysis catheter via the right internal jugular vein. The line is ready for immediate use. Electronically Signed   By: Albin Felling M.D.   On: 06/20/2022 09:23   IR US Guide Vasc Access Right  Result Date: 06/20/2022 INDICATION: AKI EXAM: Ultrasound-guided puncture of the right internal jugular vein Placement of a tunneled hemodialysis catheter using fluoroscopic guidance MEDICATIONS: Per EMR ANESTHESIA/SEDATION: Moderate  (conscious) sedation was employed during this procedure. A total of Versed 1 mg and Fentanyl 25 mcg was administered intravenously. Moderate Sedation Time: 25 minutes. The patient's level of consciousness and vital signs were monitored continuously by radiology nursing throughout the procedure under my direct supervision. FLUOROSCOPY TIME:  Fluoroscopy Time: 1.6 minutes (10 mGy) COMPLICATIONS: None immediate. PROCEDURE: Informed written consent was obtained from the patient after a thorough discussion of the procedural risks, benefits and alternatives. All questions were addressed. Maximal Sterile Barrier Technique was utilized including caps, mask, sterile gowns, sterile gloves, sterile drape, hand hygiene and skin antiseptic. A timeout was performed prior to the initiation of the procedure. The patient was placed supine on the exam table.  The right neck and chest was prepped and draped in the standard sterile fashion. Ultrasound was used to evaluate the right internal jugular vein, which found to be patent and suitable for access. An ultrasound image was permanently stored in the electronic medical record. Using ultrasound guidance, the right internal jugular vein was directly punctured using a 21 gauge micropuncture set. An 018 wire was advanced into the SVC, followed by serial tract dilation and placement of an 035 wire which was advanced into the IVC. Attention was then turned to an infraclavicular location, where a small dermatotomy was made after the administration of additional local anesthetic. From this location, a 19 cm tip-to-cuff hemodialysis catheter was tunneled to the venotomy site. A peel-away sheath was then advanced over the access wire. Through this peel-away sheath, the hemodialysis catheter was advanced into the central veins, such that the tip was positioned near the superior cavoatrial junction. The line was found to flush and aspirate appropriately. It was sutured to the skin using 0 silk  suture, and the venotomy was closed with 4-0 Vicryl and Dermabond. A sterile dressing was placed. The patient tolerated the procedure well without immediate complication. IMPRESSION: Successful placement of a tunneled hemodialysis catheter via the right internal jugular vein. The line is ready for immediate use. Electronically Signed   By: Albin Felling M.D.   On: 06/20/2022 09:23    Cardiac Studies   Echo 06/12/22:    1. 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  Left ventricular ejection fraction, by estimation, is 30-35%. The left  ventricle is abnormal. The left  ventricle has focal regional wall motion abnormalities. The left  ventricular internal cavity size was mildly dilated. There is akinesis of  the left ventricular, apical septal wall, inferior wall, anterior wall and  lateral wall. There is akinesis of the  left ventricular, entire apical segment. There is severe hypokinesis of  the left ventricular, basal-mid anterior wall. A falst tendon is present  in the mid LV cavity.   2. Right ventricular systolic function is normal. The right ventricular  size is normal. There is moderately elevated pulmonary artery systolic  pressure. The estimated right ventricular systolic pressure is 44.0 mmHg.   3. The mitral valve is normal in structure. Mild mitral valve  regurgitation. No evidence of mitral stenosis.   4. The aortic valve is tricuspid. There is moderate calcification of the  aortic valve. Aortic valve regurgitation is not visualized. Aortic valve  sclerosis/calcification is present, without any evidence of aortic  stenosis.   5. The inferior vena cava is dilated in size with >50% respiratory  variability, suggesting right atrial pressure of 8 mmHg.   Patient Profile     71 y.o. male  with a history of CAD, AAA status post repair 7 years ago, hypertension, hyperlipidemia, type  2 diabetes mellitus, OSA, GERD, renal cell CA status post partial left nephrectomy in 2016 with CKD stage IIIa, ischemic cardiomyopathy who is admitted to [facility] with CHF. Pt had undergone workup for AAA and during that workup his echo showed severe LV dysfunction with LV apical thrombus. Cardiac cath at the [facility] on 05/31/22 with CTO of the LAD and severe mid Circumflex stenosis. He had plans to see CT surgery as an outpatient for CABG. Since his heart cath 3 weeks ago he has been having increasing shortness of breath along with lower extremity pain to  the point he can barely walk.  He was seen in the ER on 9 5 and left AMA.  At that point he was in AKI with a creatinine of 3.34 with baseline of 1.5-1.6  Assessment & Plan    CAD with angina: He has severe two vessel CAD by cath at the [facility]. His coronary anatomy is favorable for CABG but this has been delayed due to renal failure. Needs cardiac MRI for viability of the anterior wall but this is hindered by his AKI. Currently being treated medically -- Continue ASA, statin, Imdur, Coreg and IV heparin.     Ischemic cardiomyopathy/Acute systolic CHF -- Echo 0/86 LVEF=30-35% with clear evidence of LV thrombus. Akinesis of the anterior wall. No significant valve disease.  -- volume management per nephrology/HD   LV thrombus -- Diagnosed at the [facility] in August 2023 and had been on Eliquis which is now held.  -- Continue IV heparin.     HTN -- BP overall stable but monitor for hypotension with new HD start -- continue coreg 6.'25mg'$  BID, hydralazine '25mg'$  TID, Imdur '15mg'$  daily, norvasc '10mg'$  daily    CKD/Acute renal failure -- Cr peaked at 9.26. Now with tunnel cath, undergoing HD.   AAA s/p repair with recurrence:  -- Workup at [place]. He will need to have this followed.    For questions or updates, please contact [place] Please consult [email or web address] for contact info under        Signed, Reino Bellis, NP  06/21/2022, 10:31 AM     Patient  seen and examined. Agree with assessment and plan. Pt feels well without recurrent chest pain. He tolerated his initial dialysis yesterday.  He is on anticoagulation now with heparin in light of his cardiomyopathy and LV thrombus.  He continues to be on isosorbide, carvedilol, statin and aspirin therapy.  Will need to schedule patient for cardiac MRI to assess for viability of his anterior wall prior to final decision regarding CABG revascularization when timing okay from nephrology standpoint.   Troy Sine, MD, [facility] 06/21/2022 11:43 AM

## 2022-06-21 NOTE — Progress Notes (Signed)
ANTICOAGULATION CONSULT NOTE - Follow-up Note  Pharmacy Consult for Heparin Indication: chest pain/ACS & LV thrombus  Allergies  Allergen Reactions   Dilaudid [Hydromorphone Hcl] Other (See Comments)    hallucinations   Mirapex [Pramipexole Dihydrochloride] Other (See Comments)    Leg pain   Prednisone Other (See Comments)    irritability    Patient Measurements: Height: '5\' 10"'$  (177.8 cm) Weight: 72.5 kg (159 lb 13.3 oz) IBW/kg (Calculated) : 73 Heparin Dosing Weight: 75.8kg  Vital Signs: Temp: 97.9 F (36.6 C) (09/19 0626) Temp Source: Oral (09/19 0626) BP: 127/86 (09/19 1155) Pulse Rate: 90 (09/19 0951)  Labs: Recent Labs    06/19/22 0301 06/20/22 1005 06/20/22 1853 06/21/22 0338 06/21/22 1146  HGB 9.7* 10.3*  --  9.6*  --   HCT 28.6* 30.2*  --  28.3*  --   PLT 192 197  --  184  --   HEPARINUNFRC 0.34 <0.10* 0.29* 0.20* 0.26*  CREATININE 8.45* 9.26*  --  6.91*  --      Estimated Creatinine Clearance: 9.9 mL/min (A) (by C-G formula based on SCr of 6.91 mg/dL (H)).   Medical History: Past Medical History:  Diagnosis Date   AAA (abdominal aortic aneurysm) (HCC)    Abdominal wall mass of left lower quadrant    Basal cell carcinoma    CAD (coronary artery disease) 2011   moderate   Colon polyp    DDD (degenerative disc disease)    Depression    Diabetes mellitus type II    no meds   GERD (gastroesophageal reflux disease)    Hyperlipidemia    Neuromuscular disorder (HCC)    Osteopenia    PAD (peripheral artery disease) (HCC)    Renal cell carcinoma    Restless leg syndrome    Sleep apnea    used a cpap yr ago-does not snore or use it now.    Medications:  Medications Prior to Admission  Medication Sig Dispense Refill Last Dose   amLODipine (NORVASC) 10 MG tablet Take 10 mg by mouth every evening.   06/11/2022   apixaban (ELIQUIS) 5 MG TABS tablet Take 5 mg by mouth 2 (two) times daily.   06/11/2022 at 2000   atorvastatin (LIPITOR) 40 MG tablet Take  40 mg by mouth at bedtime.   06/11/2022   citalopram (CELEXA) 40 MG tablet Take 40 mg by mouth every evening.   06/11/2022   clonazePAM (KLONOPIN) 0.5 MG tablet Take 0.5 mg by mouth at bedtime.   06/11/2022   gabapentin (NEURONTIN) 300 MG capsule Take 300 mg by mouth 3 (three) times daily.   06/11/2022   lansoprazole (PREVACID) 15 MG capsule Take 15 mg by mouth every morning.   06/11/2022   nicotine (NICODERM CQ - DOSED IN MG/24 HOURS) 21 mg/24hr patch Place 21 mg onto the skin daily.   06/12/2022   QUEtiapine (SEROQUEL) 300 MG tablet Take 300 mg by mouth at bedtime.   06/11/2022    Scheduled:   amLODipine  10 mg Oral Daily   aspirin EC  81 mg Oral Daily   atorvastatin  40 mg Oral Daily   carvedilol  6.25 mg Oral BID WC   Chlorhexidine Gluconate Cloth  6 each Topical Q0600   citalopram  20 mg Oral QPM   clonazePAM  0.5 mg Oral Daily   furosemide  40 mg Intravenous Q12H   hydrALAZINE  25 mg Oral Q8H   insulin aspart  0-15 Units Subcutaneous TID WC   isosorbide  mononitrate  15 mg Oral Daily   pantoprazole  40 mg Oral Daily   polyethylene glycol  17 g Oral Daily   QUEtiapine  300 mg Oral QHS   sodium chloride flush  3 mL Intravenous Q12H   Infusions:   sodium chloride     heparin 1,450 Units/hr (06/21/22 0511)   PRN: sodium chloride, acetaminophen, ondansetron (ZOFRAN) IV, sodium chloride flush  Assessment: 89 yom with a 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 Patient presented on 06/12/22 with SOB. Patient was on apixaban prior to arrival. Last dose 9/9 at 2000. He is also noted with 2V CAD and for possible CABG. Deferring CABG for now until resolution of acute renal status. Heparin per pharmacy consult placed for chest pain/ACS and LV thrombus .  -heparin level= 0.26 on 1450 units/hr  Goal of Therapy:  Heparin level 0.3-0.7 units/ml Monitor platelets by anticoagulation protocol:  Yes   Plan:  -Increase heparin to 1600 units/hr -Heparin level and CBC in am  Hildred Laser, PharmD Clinical Pharmacist **Pharmacist phone directory can now be found on [email or web address] ([email or web address]).  Listed under [place].

## 2022-06-22 ENCOUNTER — Ambulatory Visit: Payer: Medicare Other | Admitting: Pulmonary Disease

## 2022-06-22 DIAGNOSIS — I5023 Acute on chronic systolic (congestive) heart failure: Secondary | ICD-10-CM | POA: Diagnosis not present

## 2022-06-22 DIAGNOSIS — I25118 Atherosclerotic heart disease of native coronary artery with other forms of angina pectoris: Secondary | ICD-10-CM | POA: Diagnosis not present

## 2022-06-22 DIAGNOSIS — I5031 Acute diastolic (congestive) heart failure: Secondary | ICD-10-CM | POA: Diagnosis not present

## 2022-06-22 DIAGNOSIS — N170 Acute kidney failure with tubular necrosis: Secondary | ICD-10-CM | POA: Diagnosis not present

## 2022-06-22 LAB — CBC
HCT: 27.5 % — ABNORMAL LOW (ref 39.0–52.0)
Hemoglobin: 9.5 g/dL — ABNORMAL LOW (ref 13.0–17.0)
MCH: 30.1 pg (ref 26.0–34.0)
MCHC: 34.5 g/dL (ref 30.0–36.0)
MCV: 87 fL (ref 80.0–100.0)
Platelets: 192 10*3/uL (ref 150–400)
RBC: 3.16 MIL/uL — ABNORMAL LOW (ref 4.22–5.81)
RDW: 15.7 % — ABNORMAL HIGH (ref 11.5–15.5)
WBC: 9.7 10*3/uL (ref 4.0–10.5)
nRBC: 0 % (ref 0.0–0.2)

## 2022-06-22 LAB — RENAL FUNCTION PANEL
Albumin: 2.7 g/dL — ABNORMAL LOW (ref 3.5–5.0)
Anion gap: 16 — ABNORMAL HIGH (ref 5–15)
BUN: 78 mg/dL — ABNORMAL HIGH (ref 8–23)
CO2: 23 mmol/L (ref 22–32)
Calcium: 9.1 mg/dL (ref 8.9–10.3)
Chloride: 99 mmol/L (ref 98–111)
Creatinine, Ser: 8.14 mg/dL — ABNORMAL HIGH (ref 0.61–1.24)
GFR, Estimated: 6 mL/min — ABNORMAL LOW (ref >60)
Glucose, Bld: 105 mg/dL — ABNORMAL HIGH (ref 70–99)
Phosphorus: 5.7 mg/dL — ABNORMAL HIGH (ref 2.5–4.6)
Potassium: 4.2 mmol/L (ref 3.5–5.1)
Sodium: 138 mmol/L (ref 135–145)

## 2022-06-22 LAB — HEPARIN LEVEL (UNFRACTIONATED)
Heparin Unfractionated: 0.97 IU/mL — ABNORMAL HIGH (ref 0.30–0.70)
Heparin Unfractionated: 0.29 IU/mL — ABNORMAL LOW (ref 0.30–0.70)

## 2022-06-22 LAB — GLUCOSE, CAPILLARY
Glucose-Capillary: 112 mg/dL — ABNORMAL HIGH (ref 70–99)
Glucose-Capillary: 105 mg/dL — ABNORMAL HIGH (ref 70–99)
Glucose-Capillary: 137 mg/dL — ABNORMAL HIGH (ref 70–99)
Glucose-Capillary: 126 mg/dL — ABNORMAL HIGH (ref 70–99)

## 2022-06-22 MED ORDER — CHLORHEXIDINE GLUCONATE CLOTH 2 % EX PADS
6.0000 | MEDICATED_PAD | Freq: Every day | CUTANEOUS | Status: DC
  Administered 2022-06-23: 6 via TOPICAL

## 2022-06-22 NOTE — Assessment & Plan Note (Addendum)
Positive angina but no acute coronary syndrome (ruled out for NSTEMI).  Patient with severe tow vessel coronary artery disease, will need  CABG. Plan to continue medical therapy, and follow up as outpatient with CT surgery for CABG Currently patient is chest pain free.  Plan to get cardiac MRI as outpatient.

## 2022-06-22 NOTE — Progress Notes (Addendum)
Progress Note   Patient: Allen Cortez TMH:[id number] DOB: [birth year] DOA: 06/12/2022     10 DOS: the patient was seen and examined on 06/22/2022   Brief hospital course: SAHAND GOSCH was admitted to the hospital with the working diagnosis of acute decompensation of heart failure.   71 y.o. male with a history of CAD, AAA s/p repair, hypertension, hyperlipidemia, diabetes mellitus type 2, OSA, GERD, renal cell carcinoma s/p partial left nephrectomy, CKD stage IIIa, abdominal wall mass, depression, LV thrombus on Eliquis. Patient reported worsening dyspnea and lower extremity edema. On his initial physical examination his blood pressure was 164/104, HR 94, RR 35 and 02 saturation 95%, lungs with no wheezing or rales, positive JVD, heart with S1 and S2 present and rhythmic with no gallops, abdomen with no distention and no lower extremity edema.   Na 139, K 4,1 CL 101 bicarbonate 22, glucose 106 bun 56 cr 5,0  High sensitive troponin 232 and 219  Wbc  10,7 hgb 10,7 plt 255   Chest radiograph with bilateral interstitial infiltrates with cephalization of the vasculature.   EKG 90 bpm, left axis deviation, normal intervals, left anterior fascicular block, sinus rhythm with no significant ST segment or T wave changes.   Creatinine continues to worsen. Supportive care. During hospitalization, patient developed transient aphasia and dysarthria with associated headache in setting of heparin IV infusion; code stroke called which revealed evidence of no acute stroke.  09/18 patient had a temporal HD cathter placement and started on renal replacement therapy.   Assessment and Plan: * Acute kidney injury (AKI) with acute tubular necrosis (ATN) ([place]) Patient has been placed on renal replacement therapy. Last HD is 9/18.   Today BUN is 78, with K at 4,2 and serum bicarbonate at 23. Patient with no appetite. No confusion.  Plan to follow up with nephrology recommendations for further HD.  Follow  up renal panel in am.   Acute on chronic respiratory failure due to acute pulmonary edema. Continue oxymetry monitoring and supplemental 02 per [place]   Acute on chronic systolic CHF (congestive heart failure) (HCC) Echocardiogram with reduced LV systolic function with EF 30 to 35%, akinesis of the left ventricular, apical septal wall, inferior wall, anterior wall and lateral wall, akinesis of the left ventricular, entire apical segment. Sever hypokinesis of the left ventricular basal mid anterior wall. RV systolic function preserved. RVSP 45.5   Had 2 dose of IV furosemide today.  Continue carvedilol, isosorbide and blood pressure control with amlodipine and hydralazine   Continue heparin for LV thrombus.   Coronary atherosclerosis of native coronary artery Patient with severe tow vessel coronary artery disease, may need  CABG. Plan to continue medical therapy  Further revascularization will depend on renal function and if patient will need renal replacement therapy.   DM2 (diabetes mellitus, type 2) (HCC) Continue glucose cover and monitoring with insulin sliding scale.  Patient with poor appetite Will consult nutrition for recommendations.   GERD (gastroesophageal reflux disease) Problem by history. Has had discomfort relieved with PPI.  Plan Continue PPI at higher dose  Hyperlipidemia Continue treatment with statins - increased dose  Sleep apnea Patient reports he was unable to tolerate CPAP previously   Aphasia 09/13 code stroke  MRA and MRI with no acute changes CVA has been ruled out.  Continue close neuro checks.         Subjective: Patient with no chest pain or dyspnea, continue to be very weak and deconditioned, poor oral intake  Physical Exam: Vitals:   06/22/22 0843 06/22/22 1117 06/22/22 1131 06/22/22 1401  BP: (!) 150/88 (!) 148/98 (!) 157/96 121/77  Pulse: 97 94 94   Resp: 20 19    Temp: 98.2 F (36.8 C) 97.7 F (36.5 C)    TempSrc: Oral Oral     SpO2: 96% 93%    Weight:      Height:       Neurology awake and alert ENT with positive pallor Cardiovascular with S1 and S2 present and rhythmic with no gallops or murmurs Respiratory with no rales or wheezing Abdomen with  no distention  Trace lower extremity edema  Data Reviewed:    Family Communication: I spoke with patient's wife at the bedside, we talked in detail about patient's condition, plan of care and prognosis and all questions were addressed.   Disposition: Status is: Inpatient Remains inpatient appropriate because: heart failure and renal failure   Planned Discharge Destination: Home      Author: Tawni Millers, MD 06/22/2022 2:31 PM  For on call review [email or web address].

## 2022-06-22 NOTE — Progress Notes (Signed)
[facility] Progress Note   Subjective:   at least 850 of UOP-  hemodynamically stable -  no new issues-  is frustrated that BUN and crt went up   Objective Vitals:   06/21/22 2045 06/21/22 2053 06/22/22 0550 06/22/22 0843  BP:  (!) 141/91 (!) 141/78 (!) 150/88  Pulse:  93  97  Resp:  '20 19 20  '$ Temp:  98.1 F (36.7 C) 98.4 F (36.9 C) 98.2 F (36.8 C)  TempSrc:  Oral Oral Oral  SpO2: 92% 93% 92% 96%  Weight:   70.9 kg   Height:       Physical Exam General: In bed in no distress-  chronically ill appearing Heart: Normal rate, no rub Lungs: is on side-  crackles on dep side Abdomen: soft, nondistended Extremities: no edema, warm and well perfused Neuro: Minimal tremor, conversant and oriented  Additional Objective Labs: Basic Metabolic Panel: Recent Labs  Lab 06/20/22 1005 06/21/22 0338 06/22/22 0521  NA 138 138 138  K 4.5 4.2 4.2  CL 99 98 99  CO2 21* 23 23  GLUCOSE 135* 102* 105*  BUN 100* 68* 78*  CREATININE 9.26* 6.91* 8.14*  CALCIUM 9.6 9.0 9.1  PHOS 6.2* 5.5* 5.7*   Liver Function Tests: Recent Labs  Lab 06/20/22 1005 06/21/22 0338 06/22/22 0521  ALBUMIN 3.0* 2.8* 2.7*   No results for input(s): "LIPASE", "AMYLASE" in the last 168 hours. CBC: Recent Labs  Lab 06/18/22 0346 06/19/22 0301 06/20/22 1005 06/21/22 0338 06/22/22 0521  WBC 9.4 8.7 8.2 9.4 9.7  HGB 10.2* 9.7* 10.3* 9.6* 9.5*  HCT 30.8* 28.6* 30.2* 28.3* 27.5*  MCV 89.3 88.3 88.6 87.9 87.0  PLT 214 192 197 184 192   Blood Culture No results found for: "SDES", "SPECREQUEST", "CULT", "REPTSTATUS"  Cardiac Enzymes: No results for input(s): "CKTOTAL", "CKMB", "CKMBINDEX", "TROPONINI" in the last 168 hours.  CBG: Recent Labs  Lab 06/21/22 0624 06/21/22 1153 06/21/22 1618 06/21/22 2343 06/22/22 0559  GLUCAP 111* 92 120* 104* 112*   Iron Studies: No results for input(s): "IRON", "TIBC", "TRANSFERRIN", "FERRITIN" in the last 72  hours. '@lablastinr3'$ @ Studies/Results: No results found. Medications:  sodium chloride     heparin 1,400 Units/hr (06/22/22 0658)    amLODipine  10 mg Oral Daily   aspirin EC  81 mg Oral Daily   atorvastatin  40 mg Oral Daily   carvedilol  6.25 mg Oral BID WC   Chlorhexidine Gluconate Cloth  6 each Topical Q0600   citalopram  20 mg Oral QPM   clonazePAM  0.5 mg Oral Daily   hydrALAZINE  25 mg Oral Q8H   insulin aspart  0-15 Units Subcutaneous TID WC   isosorbide mononitrate  15 mg Oral Daily   pantoprazole  40 mg Oral Daily   polyethylene glycol  17 g Oral Daily   QUEtiapine  300 mg Oral QHS   sodium chloride flush  3 mL Intravenous Q12H   Assessment/ Plan: AKI on CKD 3b - b/l creatinine 1.6 prior to recent heart cath. Creat here 5.0  3 wks after heart cath. The labs from 2 days post cath (creat 2.2) and from 9/5 in ED here (creat 3.3) are not consistent w/ a typical post-contrast ATN / AKI pattern however cholesterol embolic injury is possible. Cholesterol embolic injury may not improve fully as CIN/ATN may.  C3  wnl.  Likely only time will tell. UA +small blood (none on microscopy) and 30 protein; FeNa c/w ATN.  Do  not suspect GN. Renal US c/w CKD, simple cysts. He had mild uremic symptoms and also issues with oxygenation so decision made to support with HD.  S/p  tunneled dialysis catheter placement and  first HD on 9/18-  now will look to do PRN based on labs and UOP-  not today but likely will need tomorrow-  will put in orders but look at labs first Uncont HTN - getting amlodipine, hydralazine, coreg and imdur titrated by cardiology and BPs now fine.   Volume may be playing a role as well-  look to dec antihypertensives once volume is better.  Since made such good urine also added diuretic -  need to watch for hypotension -  will put admin parameter on hydralazine  CAD - sp LHC on 8/22 at [facility] as above; Cardiology following - think he'll need CABG ultimately.  Pre op testing hindered by  AKI for now.  CT surgery consulting.  A/C systolic CHF - echo with EF 30-35%.  Meds limited by kidney function.    Acute hypoxic resp failure -overall stable Tremor/myoclonic jerks -minimal issue at this time.  Off gabapentin Anemia-  not too much of an issue   Louis Meckel

## 2022-06-22 NOTE — Progress Notes (Signed)
ANTICOAGULATION CONSULT NOTE - Follow-up Note  Pharmacy Consult for Heparin Indication: chest pain/ACS & LV thrombus  Allergies  Allergen Reactions   Dilaudid [Hydromorphone Hcl] Other (See Comments)    hallucinations   Mirapex [Pramipexole Dihydrochloride] Other (See Comments)    Leg pain   Prednisone Other (See Comments)    irritability    Patient Measurements: Height: '5\' 10"'$  (177.8 cm) Weight: 70.9 kg (156 lb 6.4 oz) (scale C) IBW/kg (Calculated) : 73 Heparin Dosing Weight: 75.8kg  Vital Signs: Temp: 97.7 F (36.5 C) (09/20 1117) Temp Source: Oral (09/20 1117) BP: 121/77 (09/20 1401) Pulse Rate: 94 (09/20 1131)  Labs: Recent Labs    06/20/22 1005 06/20/22 1853 06/21/22 0338 06/21/22 1146 06/22/22 0521 06/22/22 1338  HGB 10.3*  --  9.6*  --  9.5*  --   HCT 30.2*  --  28.3*  --  27.5*  --   PLT 197  --  184  --  192  --   HEPARINUNFRC <0.10*   < > 0.20* 0.26* 0.97* 0.29*  CREATININE 9.26*  --  6.91*  --  8.14*  --    < > = values in this interval not displayed.     Estimated Creatinine Clearance: 8.2 mL/min (A) (by C-G formula based on SCr of 8.14 mg/dL (H)).   Medical History: Past Medical History:  Diagnosis Date   AAA (abdominal aortic aneurysm) (HCC)    Abdominal wall mass of left lower quadrant    Basal cell carcinoma    CAD (coronary artery disease) 2011   moderate   Colon polyp    DDD (degenerative disc disease)    Depression    Diabetes mellitus type II    no meds   GERD (gastroesophageal reflux disease)    Hyperlipidemia    Neuromuscular disorder (HCC)    Osteopenia    PAD (peripheral artery disease) (HCC)    Renal cell carcinoma    Restless leg syndrome    Sleep apnea    used a cpap yr ago-does not snore or use it now.    Medications:  Medications Prior to Admission  Medication Sig Dispense Refill Last Dose   amLODipine (NORVASC) 10 MG tablet Take 10 mg by mouth every evening.   06/11/2022   apixaban (ELIQUIS) 5 MG TABS tablet Take  5 mg by mouth 2 (two) times daily.   06/11/2022 at 2000   atorvastatin (LIPITOR) 40 MG tablet Take 40 mg by mouth at bedtime.   06/11/2022   citalopram (CELEXA) 40 MG tablet Take 40 mg by mouth every evening.   06/11/2022   clonazePAM (KLONOPIN) 0.5 MG tablet Take 0.5 mg by mouth at bedtime.   06/11/2022   gabapentin (NEURONTIN) 300 MG capsule Take 300 mg by mouth 3 (three) times daily.   06/11/2022   lansoprazole (PREVACID) 15 MG capsule Take 15 mg by mouth every morning.   06/11/2022   nicotine (NICODERM CQ - DOSED IN MG/24 HOURS) 21 mg/24hr patch Place 21 mg onto the skin daily.   06/12/2022   QUEtiapine (SEROQUEL) 300 MG tablet Take 300 mg by mouth at bedtime.   06/11/2022    Scheduled:   amLODipine  10 mg Oral Daily   aspirin EC  81 mg Oral Daily   atorvastatin  40 mg Oral Daily   carvedilol  6.25 mg Oral BID WC   Chlorhexidine Gluconate Cloth  6 each Topical Q0600   citalopram  20 mg Oral QPM   clonazePAM  0.5 mg  Oral Daily   hydrALAZINE  25 mg Oral Q8H   insulin aspart  0-15 Units Subcutaneous TID WC   isosorbide mononitrate  15 mg Oral Daily   pantoprazole  40 mg Oral Daily   polyethylene glycol  17 g Oral Daily   QUEtiapine  300 mg Oral QHS   sodium chloride flush  3 mL Intravenous Q12H   Infusions:   sodium chloride     heparin 1,400 Units/hr (06/22/22 1246)   PRN: sodium chloride, acetaminophen, ondansetron (ZOFRAN) IV, sodium chloride flush  Assessment: 16 yom with a history of LV thrombus (05/2022) on on apixaban prior to arrival (Last dose 9/9 at 2000). He noted with 2V CAD and for possible CABG. Deferring CABG for now until resolution of acute renal status. Heparin per pharmacy consult placed for chest pain/ACS and LV thrombus .  -heparin level= 0.29 on 1400 units/hr  Goal of Therapy:  Heparin level 0.3-0.7 units/ml Monitor platelets by anticoagulation protocol: Yes   Plan:  -Increase heparin to 1500 units/hr -Heparin level and CBC in am  Hildred Laser, PharmD Clinical  Pharmacist **Pharmacist phone directory can now be found on [email or web address] ([email or web address]).  Listed under [place].

## 2022-06-22 NOTE — Progress Notes (Signed)
ANTICOAGULATION CONSULT NOTE  Pharmacy Consult for Heparin Indication: chest pain/ACS & LV thrombus Brief A/P: Heparin level supratherapeutic Decrease Heparin rate  Allergies  Allergen Reactions   Dilaudid [Hydromorphone Hcl] Other (See Comments)    hallucinations   Mirapex [Pramipexole Dihydrochloride] Other (See Comments)    Leg pain   Prednisone Other (See Comments)    irritability    Patient Measurements: Height: '5\' 10"'$  (177.8 cm) Weight: 70.9 kg (156 lb 6.4 oz) (scale C) IBW/kg (Calculated) : 73 Heparin Dosing Weight: 75.8kg  Vital Signs: Temp: 98.4 F (36.9 C) (09/20 0550) Temp Source: Oral (09/20 0550) BP: 141/78 (09/20 0550) Pulse Rate: 93 (09/19 2053)  Labs: Recent Labs    06/20/22 1005 06/20/22 1853 06/21/22 0338 06/21/22 1146 06/22/22 0521  HGB 10.3*  --  9.6*  --  9.5*  HCT 30.2*  --  28.3*  --  27.5*  PLT 197  --  184  --  192  HEPARINUNFRC <0.10*   < > 0.20* 0.26* 0.97*  CREATININE 9.26*  --  6.91*  --   --    < > = values in this interval not displayed.     Estimated Creatinine Clearance: 9.7 mL/min (A) (by C-G formula based on SCr of 6.91 mg/dL (H)).  Assessment: 71 y.o. male with ACS and h/o LV thrombus, Eliquis on hold, for heparin  Goal of Therapy:  Heparin level 0.3-0.7 units/ml Monitor platelets by anticoagulation protocol: Yes   Plan:  Decrease Heparin 1400 units/hr Check heparin level in 8 hours.  Phillis Knack, PharmD, BCPS

## 2022-06-22 NOTE — Progress Notes (Signed)
   06/22/22 1600  Mobility  Activity Ambulated with assistance in hallway  Level of Assistance Modified independent, requires aide device or extra time  Distance Ambulated (ft) 45 ft  Activity Response Tolerated well  $Mobility charge 1 Mobility   Mobility Specialist Progress Note  Pt was in chair and agreeable. Mobility cut short d/t c/o leg pain. Returned to chair w/ all needs met and call bell in reach.   Lucious Groves Mobility Specialist

## 2022-06-22 NOTE — Assessment & Plan Note (Signed)
09/13 code stroke  MRA and MRI with no acute changes CVA has been ruled out.  Continue close neuro checks.

## 2022-06-23 DIAGNOSIS — E440 Moderate protein-calorie malnutrition: Secondary | ICD-10-CM | POA: Diagnosis present

## 2022-06-23 DIAGNOSIS — I5031 Acute diastolic (congestive) heart failure: Secondary | ICD-10-CM | POA: Diagnosis not present

## 2022-06-23 DIAGNOSIS — I25118 Atherosclerotic heart disease of native coronary artery with other forms of angina pectoris: Secondary | ICD-10-CM | POA: Diagnosis not present

## 2022-06-23 DIAGNOSIS — I5023 Acute on chronic systolic (congestive) heart failure: Secondary | ICD-10-CM | POA: Diagnosis not present

## 2022-06-23 DIAGNOSIS — N170 Acute kidney failure with tubular necrosis: Secondary | ICD-10-CM | POA: Diagnosis not present

## 2022-06-23 LAB — GLUCOSE, CAPILLARY
Glucose-Capillary: 116 mg/dL — ABNORMAL HIGH (ref 70–99)
Glucose-Capillary: 106 mg/dL — ABNORMAL HIGH (ref 70–99)
Glucose-Capillary: 105 mg/dL — ABNORMAL HIGH (ref 70–99)
Glucose-Capillary: 115 mg/dL — ABNORMAL HIGH (ref 70–99)

## 2022-06-23 LAB — CBC
HCT: 28.1 % — ABNORMAL LOW (ref 39.0–52.0)
Hemoglobin: 9.6 g/dL — ABNORMAL LOW (ref 13.0–17.0)
MCH: 30.2 pg (ref 26.0–34.0)
MCHC: 34.2 g/dL (ref 30.0–36.0)
MCV: 88.4 fL (ref 80.0–100.0)
Platelets: 190 10*3/uL (ref 150–400)
RBC: 3.18 MIL/uL — ABNORMAL LOW (ref 4.22–5.81)
RDW: 15.6 % — ABNORMAL HIGH (ref 11.5–15.5)
WBC: 11.4 10*3/uL — ABNORMAL HIGH (ref 4.0–10.5)
nRBC: 0 % (ref 0.0–0.2)

## 2022-06-23 LAB — BASIC METABOLIC PANEL
Anion gap: 15 (ref 5–15)
BUN: 91 mg/dL — ABNORMAL HIGH (ref 8–23)
CO2: 22 mmol/L (ref 22–32)
Calcium: 9.2 mg/dL (ref 8.9–10.3)
Chloride: 99 mmol/L (ref 98–111)
Creatinine, Ser: 8.65 mg/dL — ABNORMAL HIGH (ref 0.61–1.24)
GFR, Estimated: 6 mL/min — ABNORMAL LOW (ref >60)
Glucose, Bld: 107 mg/dL — ABNORMAL HIGH (ref 70–99)
Potassium: 4.2 mmol/L (ref 3.5–5.1)
Sodium: 136 mmol/L (ref 135–145)

## 2022-06-23 LAB — HEPARIN LEVEL (UNFRACTIONATED): Heparin Unfractionated: 0.31 IU/mL (ref 0.30–0.70)

## 2022-06-23 MED ORDER — RENA-VITE PO TABS
1.0000 | ORAL_TABLET | Freq: Every day | ORAL | Status: DC
  Administered 2022-06-23 – 2022-06-26 (×4): 1 via ORAL
  Filled 2022-06-23 (×5): qty 1

## 2022-06-23 MED ORDER — HYDROMORPHONE HCL 1 MG/ML IJ SOLN
0.5000 mg | Freq: Once | INTRAMUSCULAR | Status: AC
  Administered 2022-06-23: 0.5 mg via INTRAVENOUS
  Filled 2022-06-23: qty 0.5

## 2022-06-23 MED ORDER — ENSURE ENLIVE PO LIQD
237.0000 mL | Freq: Two times a day (BID) | ORAL | Status: DC
  Administered 2022-06-24 – 2022-06-27 (×4): 237 mL via ORAL
  Filled 2022-06-23: qty 237

## 2022-06-23 MED ORDER — WARFARIN SODIUM 5 MG PO TABS
5.0000 mg | ORAL_TABLET | Freq: Once | ORAL | Status: AC
  Administered 2022-06-23: 5 mg via ORAL
  Filled 2022-06-23: qty 1

## 2022-06-23 MED ORDER — HEPARIN SODIUM (PORCINE) 1000 UNIT/ML IJ SOLN
INTRAMUSCULAR | Status: AC
  Administered 2022-06-23: 3000 [IU]
  Filled 2022-06-23: qty 4

## 2022-06-23 MED ORDER — WARFARIN - PHARMACIST DOSING INPATIENT: Freq: Every day | Status: DC

## 2022-06-23 NOTE — Progress Notes (Signed)
Progress Note   Patient: Allen Cortez IWP:[id number] DOB: 01-26-1951 DOA: 06/12/2022     11 DOS: the patient was seen and examined on 06/23/2022   Brief hospital course: Allen Cortez was admitted to the hospital with the working diagnosis of acute decompensation of heart failure.   71 y.o. male with a history of CAD, AAA s/p repair, hypertension, hyperlipidemia, diabetes mellitus type 2, OSA, GERD, renal cell carcinoma s/p partial left nephrectomy, CKD stage IIIa, abdominal wall mass, depression, LV thrombus on Eliquis. Patient reported worsening dyspnea and lower extremity edema. On his initial physical examination his blood pressure was 164/104, HR 94, RR 35 and 02 saturation 95%, lungs with no wheezing or rales, positive JVD, heart with S1 and S2 present and rhythmic with no gallops, abdomen with no distention and no lower extremity edema.   Na 139, K 4,1 CL 101 bicarbonate 22, glucose 106 bun 56 cr 5,0  High sensitive troponin 232 and 219  Wbc  10,7 hgb 10,7 plt 255   Chest radiograph with bilateral interstitial infiltrates with cephalization of the vasculature.   EKG 90 bpm, left axis deviation, normal intervals, left anterior fascicular block, sinus rhythm with no significant ST segment or T wave changes.   Creatinine continues to worsen. Supportive care. During hospitalization, patient developed transient aphasia and dysarthria with associated headache in setting of heparin IV infusion; code stroke called which revealed evidence of no acute stroke.  09/18 patient had a temporal HD cathter placement and started on renal replacement therapy.   09/21 patient will need to continue with outpatient HD. He has been started on warfarin for anticoagulation. Outpatient follow up with CT surgery for eventual CABG.   Assessment and Plan: * Acute kidney injury (AKI) with acute tubular necrosis (ATN) ([place]) Patient has been placed on renal replacement therapy. Last HD is 9/18.  Urine  output is 300 cc  Serum cr continue elevated at 8,65 and BUN at 91, K is 4,2 and serum bicarbonate at 22.  Plan to resume renal replacement therapy, and patient will need to continue outpatient HD.   Acute on chronic respiratory failure due to acute pulmonary edema. Continue oxymetry monitoring and supplemental 02 per [place] Oxymetry today on room air is 96%   Acute on chronic systolic CHF (congestive heart failure) (HCC) Echocardiogram with reduced LV systolic function with EF 30 to 35%, akinesis of the left ventricular, apical septal wall, inferior wall, anterior wall and lateral wall, akinesis of the left ventricular, entire apical segment. Severe hypokinesis of the left ventricular basal mid anterior wall. RV systolic function preserved. RVSP 45.5   Patient will need to continue with renal replacement therapy for fluid management.  Continue carvedilol, isosorbide and blood pressure control with amlodipine and hydralazine   LV thrombus, start transition to warfarin, continue with heparin IV bridging.   Coronary atherosclerosis of native coronary artery Patient with severe tow vessel coronary artery disease, will need  CABG. Plan to continue medical therapy, and follow up as outpatient with CT surgery for CABG Currently patient is chest pain free.    Type 2 diabetes mellitus with hyperlipidemia (HCC) Continue glucose cover and monitoring with insulin sliding scale.  Patient with poor appetite Fasting glucose 107 Continue with nutritional supplements.   Continue with statin therapy.   GERD (gastroesophageal reflux disease) Continue with once daily pantoprazole.   Sleep apnea Patient reports he was unable to tolerate CPAP previously   Aphasia 09/13 code stroke  MRA and MRI with no  acute changes CVA has been ruled out.  Continue close neuro checks.   Depression Plan to continue with citalopram, clonazepam, and quetiapine.         Subjective: Patient with no chest pain  or dyspnea, low appetite and not sleeping well due to frequent interruptions   Physical Exam: Vitals:   06/22/22 1642 06/22/22 1941 06/23/22 0550 06/23/22 0831  BP: (!) 140/90 (!) 150/92 (!) 140/84 (!) 152/89  Pulse: 83 84 96 94  Resp:  '20 18 16  '$ Temp:  97.8 F (36.6 C) 97.9 F (36.6 C) 97.7 F (36.5 C)  TempSrc:   Oral Oral  SpO2:  91% 92% 96%  Weight:   71 kg   Height:       Neurology awake and alert ENT with mild pallor Cardiovascular with S1 and S2 present and rhythmic with no gallops, rubs or murmurs No JVD Respiratory with no rales or wheezing Abdomen not distended No lower extremity edema  Data Reviewed:    Family Communication: no family at the bedside   Disposition: Status is: Inpatient Remains inpatient appropriate because: renal failure need outpatient HD   Planned Discharge Destination: Home    Author: Tawni Millers, MD 06/23/2022 12:32 PM  For on call review [email or web address].

## 2022-06-23 NOTE — Progress Notes (Signed)
Initial Nutrition Assessment  DOCUMENTATION CODES:   Non-severe (moderate) malnutrition in context of chronic illness  INTERVENTION:  Liberalize diet from a carb modified to a regular diet to provide widest variety of menu options to enhance nutritional adequacy  Ensure Enlive po BID, each supplement provides 350 kcal and 20 grams of protein.  Renal MVI with minerals daily  "Food Pyramid for Healthy Eating with Kidney Disease" handout added to AVS  NUTRITION DIAGNOSIS:   Moderate Malnutrition related to chronic illness (HF, CKD IIIa) as evidenced by mild fat depletion, mild muscle depletion  GOAL:   Patient will meet greater than or equal to 90% of their needs  MONITOR:   PO intake, Supplement acceptance, Diet advancement, Labs, Weight trends, I & O's  REASON FOR ASSESSMENT:   Consult Assessment of nutrition requirement/status, Diet education (new HD)  ASSESSMENT:   Pt admitted with acute decompensated HF, in addition to AKI with acute tubular necrosis. PMH significant for CAD, AAA s/p repair, HTN, HLD, T2DM, OSA, GERD, renal cell carcinoma s/p partial L nephrectomy, CKD stage IIIa, abdominal wall mass, depression, LV thrombus on Eliquis.  9/18 s/p temporary HD catheter placement and started on renal replacement therapy  Per Nephrology, pt to remain on PRN HD based on labs. Will need session today.   Pt reports that within the last 2 days his appetite has been significantly decreased. He was previously eating a couple [place] sandwiches daily and eating well PTA, however since starting dialysis he reports that food tastes different and he does not feel hungry.   He is having a difficult time eating more than a few bites of food at each meal. This morning he ate a bowl of cereal for breakfast. RN has been providing nutrition supplements for him. Will place order for pt to continue to receive scheduled Ensure.   Average meal completions: 63% x 8 recorded meals  (9/15-9/20)  Pt endorses weight loss since admission. Unfortunately, there is limited documentation of weight history on file to review. During admission, his weight is down from 74.6 kg to 71 kg. This could be r/t to decreased PO intake in addition to fluid status.   Briefly discussed with pt components of a renal diet. Informed him he would be followed by a RD through the dialysis center. Expressed importance of currently meeting adequate nutritional intake with focus on protein.   Edema: non-pitting BLE  Medications: SSI 0-15 units TID, protonix, miralax  Labs: BUN 91, Cr 8.65, GFR 6, HgbA1c 6.1%, CBG's 105-137 x24 hours  UOP: 368m x12 hours I/O's: -3.5L since admission   NUTRITION - FOCUSED PHYSICAL EXAM:  Flowsheet Row Most Recent Value  Orbital Region Mild depletion  Upper Arm Region Moderate depletion  Thoracic and Lumbar Region No depletion  Buccal Region Mild depletion  Temple Region Moderate depletion  Clavicle Bone Region Mild depletion  Clavicle and Acromion Bone Region Mild depletion  Scapular Bone Region No depletion  Dorsal Hand Mild depletion  Patellar Region Mild depletion  Anterior Thigh Region Mild depletion  Posterior Calf Region Mild depletion  Edema (RD Assessment) None  Hair Reviewed  Eyes Reviewed  Mouth Reviewed  Skin Reviewed  Nails Reviewed       Diet Order:   Diet Order             Diet Carb Modified Fluid consistency: Thin; Room service appropriate? Yes  Diet effective now  EDUCATION NEEDS:   Education needs have been addressed  Skin:  Skin Assessment: Reviewed RN Assessment (incision (closed) R face)  Last BM:  9/20  Height:   Ht Readings from Last 1 Encounters:  06/13/22 '5\' 10"'$  (1.778 m)    Weight:   Wt Readings from Last 1 Encounters:  06/23/22 71.1 kg   BMI:  Body mass index is 22.49 kg/m.  Estimated Nutritional Needs:   Kcal:  8682-5749  Protein:  90-105g  Fluid:  1L + UOP  Clayborne Dana, RDN, LDN Clinical Nutrition

## 2022-06-23 NOTE — Progress Notes (Signed)
Requested to see pt for out-pt HD needs at d/c. Met with pt at bedside. Introduced self and explained role. Pt lives very close to [place] and prefers this clinic for out-pt HD needs. Referral made to [facility] admissions this morning. Pt has [employer] benefits. Contacted [facility] social worker, Anderson Malta, to confirm that pt has VA benefits/coverage for out-pt HD needs. Anderson Malta confirms pt has coverage and will have no co-pay. Anderson Malta requesting clinicals be faxed to her for review and for [employer] auth for out-pt HD needs. Clinicals faxed to [facility] this morning. Pt states that he and his wife both drive and he will have transportation to HD appts. Will assist as needed.   Melven Sartorius Renal Navigator [phone]

## 2022-06-23 NOTE — Progress Notes (Signed)
ANTICOAGULATION CONSULT NOTE - Follow-up Note  Pharmacy Consult for Heparin, warfarin Indication: chest pain/ACS & LV thrombus  Allergies  Allergen Reactions   Dilaudid [Hydromorphone Hcl] Other (See Comments)    hallucinations   Mirapex [Pramipexole Dihydrochloride] Other (See Comments)    Leg pain   Prednisone Other (See Comments)    irritability    Patient Measurements: Height: '5\' 10"'$  (177.8 cm) Weight: 71 kg (156 lb 8 oz) (scale c) IBW/kg (Calculated) : 73 Heparin Dosing Weight: 75.8kg  Vital Signs: Temp: 97.7 F (36.5 C) (09/21 0831) Temp Source: Oral (09/21 0831) BP: 152/89 (09/21 0831) Pulse Rate: 94 (09/21 0831)  Labs: Recent Labs    06/21/22 0338 06/21/22 1146 06/22/22 0521 06/22/22 1338 06/23/22 0651  HGB 9.6*  --  9.5*  --  9.6*  HCT 28.3*  --  27.5*  --  28.1*  PLT 184  --  192  --  190  HEPARINUNFRC 0.20*   < > 0.97* 0.29* 0.31  CREATININE 6.91*  --  8.14*  --  8.65*   < > = values in this interval not displayed.     Estimated Creatinine Clearance: 7.8 mL/min (A) (by C-G formula based on SCr of 8.65 mg/dL (H)).   Medical History: Past Medical History:  Diagnosis Date   AAA (abdominal aortic aneurysm) (HCC)    Abdominal wall mass of left lower quadrant    Basal cell carcinoma    CAD (coronary artery disease) 2011   moderate   Colon polyp    DDD (degenerative disc disease)    Depression    Diabetes mellitus type II    no meds   GERD (gastroesophageal reflux disease)    Hyperlipidemia    Neuromuscular disorder (HCC)    Osteopenia    PAD (peripheral artery disease) (HCC)    Renal cell carcinoma    Restless leg syndrome    Sleep apnea    used a cpap yr ago-does not snore or use it now.    Medications:  Medications Prior to Admission  Medication Sig Dispense Refill Last Dose   amLODipine (NORVASC) 10 MG tablet Take 10 mg by mouth every evening.   06/11/2022   apixaban (ELIQUIS) 5 MG TABS tablet Take 5 mg by mouth 2 (two) times daily.    06/11/2022 at 2000   atorvastatin (LIPITOR) 40 MG tablet Take 40 mg by mouth at bedtime.   06/11/2022   citalopram (CELEXA) 40 MG tablet Take 40 mg by mouth every evening.   06/11/2022   clonazePAM (KLONOPIN) 0.5 MG tablet Take 0.5 mg by mouth at bedtime.   06/11/2022   gabapentin (NEURONTIN) 300 MG capsule Take 300 mg by mouth 3 (three) times daily.   06/11/2022   lansoprazole (PREVACID) 15 MG capsule Take 15 mg by mouth every morning.   06/11/2022   nicotine (NICODERM CQ - DOSED IN MG/24 HOURS) 21 mg/24hr patch Place 21 mg onto the skin daily.   06/12/2022   QUEtiapine (SEROQUEL) 300 MG tablet Take 300 mg by mouth at bedtime.   06/11/2022    Scheduled:   amLODipine  10 mg Oral Daily   aspirin EC  81 mg Oral Daily   atorvastatin  40 mg Oral Daily   carvedilol  6.25 mg Oral BID WC   Chlorhexidine Gluconate Cloth  6 each Topical Q0600   Chlorhexidine Gluconate Cloth  6 each Topical Q0600   citalopram  20 mg Oral QPM   clonazePAM  0.5 mg Oral Daily  hydrALAZINE  25 mg Oral Q8H   insulin aspart  0-15 Units Subcutaneous TID WC   isosorbide mononitrate  15 mg Oral Daily   pantoprazole  40 mg Oral Daily   polyethylene glycol  17 g Oral Daily   QUEtiapine  300 mg Oral QHS   sodium chloride flush  3 mL Intravenous Q12H   Infusions:   sodium chloride     heparin 1,500 Units/hr (06/23/22 0628)   PRN: sodium chloride, acetaminophen, ondansetron (ZOFRAN) IV, sodium chloride flush  Assessment: 44 yom with a history of LV thrombus (05/2022) on on apixaban prior to arrival (Last dose 9/9 at 2000). He noted with 2V CAD and for possible CABG. Deferring CABG for now until  renal function improved. Heparin per pharmacy consult placed for chest pain/ACS and LV thrombus .  Starting warfarin today. INR 1.3 earlier this admission  -heparin level= 0.31 on 1400 units/hr  Goal of Therapy:  INR 2-3 Heparin level 0.3-0.7 units/ml Monitor platelets by anticoagulation protocol: Yes   Plan:  -Continue heparin 1500  units/hr -Heparin level and CBC in am -Warfarin '5mg'$  today -Daily PT/INR  Hildred Laser, PharmD Clinical Pharmacist **Pharmacist phone directory can now be found on [email or web address] ([email or web address]).  Listed under [place].

## 2022-06-23 NOTE — Progress Notes (Signed)
New Dialysis Start   Patient identified as new dialysis start. Kidney Education packet assembled and given. Discussed the following items with patient:    Current medications and possible changes once started:  Discussed that patient's medications may change over time.  Ex; hypertension medications and diabetes medication.  Nephrologists will adjust as needed.  Fluid restrictions reviewed:  32 oz daily goal:  All liquids count; soups, ice, jello   Phosphorus and potassium: Handout given showing high potassium and phosphorus foods.  Alternative food and drink options given.  Family support:  no family present  Outpatient Clinic Resources:  Discussed roles of Outpatient clinic  staff and advised to make a list of needs, if any, to talk with outpatient staff if needed  Care plan schedule: Informed patient  Care Plans in outpatient setting and to participate in the care plan.  An invitation would be given from outpatient clinic.   Dialysis Access Options:  Reviewed access options with patients. Discussed in detail about care at home with new AVG & AVF. Reviewed checking bruit and thrill. If dialysis catheter present, educated that patient could not take showers.  Catheter dressing changes were to be done by outpatient clinic staff only  Home therapy options:  Educated patient about home therapy options:  PD vs home hemo.  Patient is currently AKI   Patient verbalized understanding. Will continue to round on patient during admission.    Lilia Argue, RN

## 2022-06-23 NOTE — Progress Notes (Signed)
Pt ran 3 hours of tx, Pt experienced leg pains during tx, Pain meds were given. Pt ran with no complications. No fluid was removed, pt was kept even during HD. 45.4 liters of blood was processed during this tx. Catheter lumens are locked with heparin per standing orders.

## 2022-06-23 NOTE — Progress Notes (Signed)
[facility] Progress Note   Subjective:   at least 300 of UOP-  hemodynamically stable -  no new issues-  is frustrated that BUN and crt went up "starting to get depressed"   Objective Vitals:   06/22/22 1638 06/22/22 1642 06/22/22 1941 06/23/22 0550  BP: (!) 140/90 (!) 140/90 (!) 150/92 (!) 140/84  Pulse: 87 83 84 96  Resp: (!) '21  20 18  '$ Temp:   97.8 F (36.6 C) 97.9 F (36.6 C)  TempSrc:    Oral  SpO2: 94%  91% 92%  Weight:    71 kg  Height:       Physical Exam General: In bed in no distress-  chronically ill appearing Heart: Normal rate, no rub Lungs: is on side-  crackles on dep side Abdomen: soft, nondistended Extremities: no edema, warm and well perfused Neuro: Minimal tremor, conversant and oriented  Additional Objective Labs: Basic Metabolic Panel: Recent Labs  Lab 06/20/22 1005 06/21/22 0338 06/22/22 0521 06/23/22 0651  NA 138 138 138 136  K 4.5 4.2 4.2 4.2  CL 99 98 99 99  CO2 21* '23 23 22  '$ GLUCOSE 135* 102* 105* 107*  BUN 100* 68* 78* 91*  CREATININE 9.26* 6.91* 8.14* 8.65*  CALCIUM 9.6 9.0 9.1 9.2  PHOS 6.2* 5.5* 5.7*  --    Liver Function Tests: Recent Labs  Lab 06/20/22 1005 06/21/22 0338 06/22/22 0521  ALBUMIN 3.0* 2.8* 2.7*   No results for input(s): "LIPASE", "AMYLASE" in the last 168 hours. CBC: Recent Labs  Lab 06/19/22 0301 06/20/22 1005 06/21/22 0338 06/22/22 0521 06/23/22 0651  WBC 8.7 8.2 9.4 9.7 11.4*  HGB 9.7* 10.3* 9.6* 9.5* 9.6*  HCT 28.6* 30.2* 28.3* 27.5* 28.1*  MCV 88.3 88.6 87.9 87.0 88.4  PLT 192 197 184 192 190   Blood Culture No results found for: "SDES", "SPECREQUEST", "CULT", "REPTSTATUS"  Cardiac Enzymes: No results for input(s): "CKTOTAL", "CKMB", "CKMBINDEX", "TROPONINI" in the last 168 hours.  CBG: Recent Labs  Lab 06/22/22 0559 06/22/22 1117 06/22/22 1632 06/22/22 2056 06/23/22 0549  GLUCAP 112* 105* 137* 126* 116*   Iron Studies: No results for input(s): "IRON", "TIBC",  "TRANSFERRIN", "FERRITIN" in the last 72 hours. '@lablastinr3'$ @ Studies/Results: No results found. Medications:  sodium chloride     heparin 1,500 Units/hr (06/23/22 0628)    amLODipine  10 mg Oral Daily   aspirin EC  81 mg Oral Daily   atorvastatin  40 mg Oral Daily   carvedilol  6.25 mg Oral BID WC   Chlorhexidine Gluconate Cloth  6 each Topical Q0600   Chlorhexidine Gluconate Cloth  6 each Topical Q0600   citalopram  20 mg Oral QPM   clonazePAM  0.5 mg Oral Daily   hydrALAZINE  25 mg Oral Q8H   insulin aspart  0-15 Units Subcutaneous TID WC   isosorbide mononitrate  15 mg Oral Daily   pantoprazole  40 mg Oral Daily   polyethylene glycol  17 g Oral Daily   QUEtiapine  300 mg Oral QHS   sodium chloride flush  3 mL Intravenous Q12H   Assessment/ Plan: AKI on CKD 3b - b/l creatinine 1.6 prior to recent heart cath.   2 days post cath (creat 2.2) and from 9/5 in ED here (creat 3.3) are not entirely consistent w/ a typical post-contrast ATN / AKI pattern however cholesterol embolic injury is possible. Creat here 5.0  3 wks after heart cath and climbing. Cholesterol embolic injury may not improve  fully as CIN/ATN may.  C3  wnl.   UA +small blood (none on microscopy) and 30 protein; FeNa c/w ATN.  Do not suspect GN. Renal US c/w CKD, simple cysts. He had mild uremic symptoms and also issues with oxygenation so decision made to support with HD.  S/p  tunneled dialysis catheter placement and  first HD on 9/18-  now doing PRN based on labs and UOP-  will need today-  will also look into arranging OP AKI HD so he can be discharged  Uncont HTN - getting amlodipine, hydralazine, coreg and imdur titrated by cardiology and BPs now fine.   Volume may be playing a role as well-  look to dec antihypertensives once volume is better.  Since made such good urine also added diuretic -  need to watch for hypotension -  will put admin parameter on hydralazine  CAD - sp LHC on 8/22 at [facility] as above; Cardiology  following - think he'll need CABG ultimately.  Pre op testing hindered by AKI for now.  CT surgery consulting.  A/C systolic CHF - echo with EF 30-35%.  Meds limited by kidney function.    Acute hypoxic resp failure -overall stable Tremor/myoclonic jerks -minimal issue at this time.  Off gabapentin Anemia-  not too much of an issue   Louis Meckel

## 2022-06-23 NOTE — Assessment & Plan Note (Signed)
Plan to continue with citalopram, clonazepam, and quetiapine.

## 2022-06-23 NOTE — Plan of Care (Signed)
  Problem: Education: Goal: Ability to describe self-care measures that may prevent or decrease complications (Diabetes Survival Skills Education) will improve Outcome: Progressing Goal: Individualized Educational Video(s) Outcome: Progressing   Problem: Coping: Goal: Ability to adjust to condition or change in health will improve Outcome: Progressing   Problem: Health Behavior/Discharge Planning: Goal: Ability to manage health-related needs will improve Outcome: Progressing   Problem: Metabolic: Goal: Ability to maintain appropriate glucose levels will improve Outcome: Progressing

## 2022-06-23 NOTE — Progress Notes (Signed)
Dr Cathlean Sauer ordered dilaudid 0.5 mg iv for pt report of "severe" aching cramping" BLE pain. This rn reinforced to md pt allergy to dilaudid is hallucination. Md states will order low dose d/t limited options. Md consulted Rph for recent administration of dilaudid, no record of recent dilaudid given to pt per Rph. Pt reports to this rn confusion with dilaudid occurred "many years ago", pt in agreement to received med, will give dilaudid 0.5 mg as prescribed by md

## 2022-06-23 NOTE — Progress Notes (Signed)
RN tube HD RN a one time dose of isosorbide for the patient to be given in HD.

## 2022-06-23 NOTE — Discharge Instructions (Addendum)
Information on my medicine - Coumadin   (Warfarin)  Why was Coumadin prescribed for you? Coumadin was prescribed for you because you have a blood clot or a medical condition that can cause an increased risk of forming blood clots. Blood clots can cause serious health problems by blocking the flow of blood to the heart, lung, or brain. Coumadin can prevent harmful blood clots from forming. As a reminder your indication for Coumadin is:  a clot in the card and stroke prevention  What test will check on my response to Coumadin? While on Coumadin (warfarin) you will need to have an INR test regularly to ensure that your dose is keeping you in the desired range. The INR (international normalized ratio) number is calculated from the result of the laboratory test called prothrombin time (PT).  If an INR APPOINTMENT HAS NOT ALREADY BEEN MADE FOR YOU please schedule an appointment to have this lab work done by your health care provider within 7 days. Your INR goal is usually a number between:  2 to 3 or your provider may give you a more narrow range like 2-2.5.  Ask your health care provider during an office visit what your goal INR is.  What  do you need to  know  About  COUMADIN? Take Coumadin (warfarin) exactly as prescribed by your healthcare provider about the same time each day.  DO NOT stop taking without talking to the doctor who prescribed the medication.  Stopping without other blood clot prevention medication to take the place of Coumadin may increase your risk of developing a new clot or stroke.  Get refills before you run out.  What do you do if you miss a dose? If you miss a dose, take it as soon as you remember on the same day then continue your regularly scheduled regimen the next day.  Do not take two doses of Coumadin at the same time.  Important Safety Information A possible side effect of Coumadin (Warfarin) is an increased risk of bleeding. You should call your healthcare provider  right away if you experience any of the following: Bleeding from an injury or your nose that does not stop. Unusual colored urine (red or dark brown) or unusual colored stools (red or black). Unusual bruising for unknown reasons. A serious fall or if you hit your head (even if there is no bleeding).  Some foods or medicines interact with Coumadin (warfarin) and might alter your response to warfarin. To help avoid this: Eat a balanced diet, maintaining a consistent amount of Vitamin K. Notify your provider about major diet changes you plan to make. Avoid alcohol or limit your intake to 1 drink for women and 2 drinks for men per day. (1 drink is 5 oz. wine, 12 oz. beer, or 1.5 oz. liquor.)  Make sure that ANY health care provider who prescribes medication for you knows that you are taking Coumadin (warfarin).  Also make sure the healthcare provider who is monitoring your Coumadin knows when you have started a new medication including herbals and non-prescription products.  Coumadin (Warfarin)  Major Drug Interactions  Increased Warfarin Effect Decreased Warfarin Effect  Alcohol (large quantities) Antibiotics (esp. Septra/Bactrim, Flagyl, Cipro) Amiodarone (Cordarone) Aspirin (ASA) Cimetidine (Tagamet) Megestrol (Megace) NSAIDs (ibuprofen, naproxen, etc.) Piroxicam (Feldene) Propafenone (Rythmol SR) Propranolol (Inderal) Isoniazid (INH) Posaconazole (Noxafil) Barbiturates (Phenobarbital) Carbamazepine (Tegretol) Chlordiazepoxide (Librium) Cholestyramine (Questran) Griseofulvin Oral Contraceptives Rifampin Sucralfate (Carafate) Vitamin K   Coumadin (Warfarin) Major Herbal Interactions  Increased Warfarin Effect Decreased  Warfarin Effect  Garlic Ginseng Ginkgo biloba Coenzyme Q10 Green tea St. John's wort    Coumadin (Warfarin) FOOD Interactions  Eat a consistent number of servings per week of foods HIGH in Vitamin K (1 serving =  cup)  Collards (cooked, or boiled &  drained) Kale (cooked, or boiled & drained) Mustard greens (cooked, or boiled & drained) Parsley *serving size only =  cup Spinach (cooked, or boiled & drained) Swiss chard (cooked, or boiled & drained) Turnip greens (cooked, or boiled & drained)  Eat a consistent number of servings per week of foods MEDIUM-HIGH in Vitamin K (1 serving = 1 cup)  Asparagus (cooked, or boiled & drained) Broccoli (cooked, boiled & drained, or raw & chopped) Brussel sprouts (cooked, or boiled & drained) *serving size only =  cup Lettuce, raw (green leaf, endive, romaine) Spinach, raw Turnip greens, raw & chopped   These websites have more information on Coumadin (warfarin):  FailFactory.se; VeganReport.com.au;

## 2022-06-23 NOTE — Progress Notes (Addendum)
Received patient in bed to unit.  Alert and oriented.  Informed consent signed and in chart. Heparin drip at 15 ml/hr LPIV.  Treatment initiated: [place] Treatment completed: 1629  Patient tolerated. Pt reported "severe aching cramping pain BLE despite initial orders to run hd with uf off. Pt medicated with dilaudid 0.5 mg ivp. Reversed A/V blood lines and manual flushes given intermittently d/t elevated A/V pressures.  Transported back to the room. Heparin drip at 15 ml/hr. Oxygen at 2 liters nasal cannula Alert, without acute distress.  Hand-off given to patient's nurse.   Access used: HD catheter RIJ Access issues: yes. Elevated arterial pressures d/t positional catheter. Slight resistance with venous blood return.   Total UF removed: -700 liters Medication(s) given: dilaudid 0.5 mg  Post HD VS: 156/96 HR 98 RR 25 Sat 95% on 2 liters nasal cannula Post HD weight: 71.2 kg bed wt    [facility]

## 2022-06-24 DIAGNOSIS — N170 Acute kidney failure with tubular necrosis: Secondary | ICD-10-CM | POA: Diagnosis not present

## 2022-06-24 DIAGNOSIS — I21A1 Myocardial infarction type 2: Secondary | ICD-10-CM | POA: Insufficient documentation

## 2022-06-24 DIAGNOSIS — F32A Depression, unspecified: Secondary | ICD-10-CM

## 2022-06-24 DIAGNOSIS — I214 Non-ST elevation (NSTEMI) myocardial infarction: Principal | ICD-10-CM | POA: Insufficient documentation

## 2022-06-24 DIAGNOSIS — I5031 Acute diastolic (congestive) heart failure: Secondary | ICD-10-CM | POA: Diagnosis not present

## 2022-06-24 DIAGNOSIS — I5023 Acute on chronic systolic (congestive) heart failure: Secondary | ICD-10-CM | POA: Diagnosis not present

## 2022-06-24 DIAGNOSIS — E440 Moderate protein-calorie malnutrition: Secondary | ICD-10-CM

## 2022-06-24 DIAGNOSIS — I25118 Atherosclerotic heart disease of native coronary artery with other forms of angina pectoris: Secondary | ICD-10-CM | POA: Diagnosis not present

## 2022-06-24 DIAGNOSIS — I255 Ischemic cardiomyopathy: Secondary | ICD-10-CM | POA: Diagnosis not present

## 2022-06-24 LAB — CBC
HCT: 27.9 % — ABNORMAL LOW (ref 39.0–52.0)
Hemoglobin: 9.2 g/dL — ABNORMAL LOW (ref 13.0–17.0)
MCH: 29.4 pg (ref 26.0–34.0)
MCHC: 33 g/dL (ref 30.0–36.0)
MCV: 89.1 fL (ref 80.0–100.0)
Platelets: 184 10*3/uL (ref 150–400)
RBC: 3.13 MIL/uL — ABNORMAL LOW (ref 4.22–5.81)
RDW: 15.7 % — ABNORMAL HIGH (ref 11.5–15.5)
WBC: 14.4 10*3/uL — ABNORMAL HIGH (ref 4.0–10.5)
nRBC: 0 % (ref 0.0–0.2)

## 2022-06-24 LAB — GLUCOSE, CAPILLARY
Glucose-Capillary: 99 mg/dL (ref 70–99)
Glucose-Capillary: 139 mg/dL — ABNORMAL HIGH (ref 70–99)
Glucose-Capillary: 97 mg/dL (ref 70–99)
Glucose-Capillary: 135 mg/dL — ABNORMAL HIGH (ref 70–99)

## 2022-06-24 LAB — BASIC METABOLIC PANEL
Anion gap: 15 (ref 5–15)
BUN: 46 mg/dL — ABNORMAL HIGH (ref 8–23)
CO2: 26 mmol/L (ref 22–32)
Calcium: 9.1 mg/dL (ref 8.9–10.3)
Chloride: 97 mmol/L — ABNORMAL LOW (ref 98–111)
Creatinine, Ser: 6.11 mg/dL — ABNORMAL HIGH (ref 0.61–1.24)
GFR, Estimated: 9 mL/min — ABNORMAL LOW (ref >60)
Glucose, Bld: 103 mg/dL — ABNORMAL HIGH (ref 70–99)
Potassium: 4.3 mmol/L (ref 3.5–5.1)
Sodium: 138 mmol/L (ref 135–145)

## 2022-06-24 LAB — PROTIME-INR
INR: 1.1 (ref 0.8–1.2)
Prothrombin Time: 14.6 seconds (ref 11.4–15.2)

## 2022-06-24 LAB — HEPARIN LEVEL (UNFRACTIONATED): Heparin Unfractionated: 0.24 IU/mL — ABNORMAL LOW (ref 0.30–0.70)

## 2022-06-24 MED ORDER — FUROSEMIDE 40 MG PO TABS
40.0000 mg | ORAL_TABLET | Freq: Two times a day (BID) | ORAL | Status: DC
  Administered 2022-06-24 – 2022-06-27 (×6): 40 mg via ORAL
  Filled 2022-06-24 (×6): qty 1

## 2022-06-24 MED ORDER — CHLORHEXIDINE GLUCONATE CLOTH 2 % EX PADS
6.0000 | MEDICATED_PAD | Freq: Every day | CUTANEOUS | Status: DC
  Administered 2022-06-26: 6 via TOPICAL

## 2022-06-24 MED ORDER — DARBEPOETIN ALFA 60 MCG/0.3ML IJ SOSY
60.0000 ug | PREFILLED_SYRINGE | INTRAMUSCULAR | Status: DC
  Administered 2022-06-24: 60 ug via SUBCUTANEOUS
  Filled 2022-06-24: qty 0.3

## 2022-06-24 MED ORDER — WARFARIN SODIUM 5 MG PO TABS
5.0000 mg | ORAL_TABLET | Freq: Once | ORAL | Status: AC
  Administered 2022-06-24: 5 mg via ORAL
  Filled 2022-06-24: qty 1

## 2022-06-24 NOTE — TOC Progression Note (Addendum)
Transition of Care [facility]) -Progression Note    Patient Details  Name: RHONIN TROTT MRN: [id number] Date of Birth: March 24, 1951  Transition of Care [facility]) CM/SW Contact  Zenon Mayo, RN Phone Number: 06/24/2022, 3:17 PM   Initial Clinical Narrative:    Readmission Risk completed.  NCM spoke with patient , wife at the bedside, wife states they would like to have a rolling walker, a light weight w/chair and a hospital bed, and possibly will need home oxygen.  She states she does not have a preference of which DME agency supplies the DME.  She also states she will need HHPT and HHAIDE.  NCM offered choice  with [email or web address] list, she is looking over list, will check back in half an hour.  Also patient has Medicare and would like for the DME to go thru his Medicare and the [facility] to go thru his medicare.  Medicare ID is [id number].  NCM ordered the walker thru [employer] for [employer].  The other DME was ordered thru epic with [employer] with Thedore Mins. NCM checked back with wife, she states she would like [place] for [facility] services.  NCM will make referral to [facility]. NCM made referral to [facility] with Alvis Lemmings, he can take referral for HHPT, HHAIDE.  Soc will begin 24 to 48 hrs post dc.  Patient also may need home oxygen at discharge, TOC will continue to monitor for home oxygen.      Expected Discharge Plan: [place] Barriers to Discharge: Continued Medical Work up  Expected Discharge Plan and Services Expected Discharge Plan: [place]   Discharge Planning Services: CM Consult Post Acute Care Choice: Home Health, Durable Medical Equipment Living arrangements for the past 2 months: Single Family Home                 DME Arranged: Walker rolling, Hospital bed, Lightweight manual wheelchair with seat cushion DME Agency: [employer] Date DME Agency Contacted: 06/24/22 Time DME Agency Contacted: [phone] Representative spoke with at DME Agency: Thedore Mins HH Arranged: PT,  Nurse's Aide [place] Agency: [place] Date [place]: 06/24/22 Time [place]: [place] Representative spoke with at [place]: Tommi Rumps   Social Determinants of Health ([place]) Interventions    Readmission Risk Interventions     No data to display

## 2022-06-24 NOTE — Progress Notes (Addendum)
Pt has been accepted at [facility] on TTS 5:55 am chair time. Pt can start on Tuesday and will need to arrive at 5:35 am. Met with pt at bedside to discuss above details. Pt agreeable to plan and provided schedule letter with details noted. Arrangements added to AVS as well. Update provided to attending, nephrologist, RN, RN CM, and CSW via secure chat. Will update clinic on d/c date once known. [facility] left [facility] social worker requesting a return call to provide above out-pt HD information for [place] auth. Awaiting a response.   Melven Sartorius Renal Navigator [phone]  Addendum at 12:35 pm: Received a call from [facility] HD CSW. Anderson Malta advised of pt's out-pt HD clinic, schedule,and MD for [facility] auth. Anderson Malta advised pt will start on Tuesday. Received a call from pt's son, Gershon Mussel, requesting a later appt time at clinic. Contacted [place] and spoke to [place]. Clinic does not have a later appt time at this time. Returned call to son to make him aware that clinic does not have a later time available at this time but pt can be placed on a waiting list. Pt's son agreeable to this due to pt/family wanting pt to receive care at [facility] which is very close to pt's home. Explained out-pt HD info with pt's son and answered questions. Son requesting to speak to renal educator and [facility] staff regarding d/c plans/planning. Appropriate staff contacted and to contact son per his request.

## 2022-06-24 NOTE — Progress Notes (Addendum)
Progress Note   Patient: Allen Cortez:[id number] DOB: 10-09-1950 DOA: 06/12/2022     12 DOS: the patient was seen and examined on 06/24/2022   Brief hospital course: Allen Cortez was admitted to the hospital with the working diagnosis of acute decompensation of heart failure. Progressive renal failure now on ERSD.   71 y.o. male with a history of CAD, AAA s/p repair, hypertension, hyperlipidemia, diabetes mellitus type 2, OSA, GERD, renal cell carcinoma s/p partial left nephrectomy, CKD stage IIIa, abdominal wall mass, depression, LV thrombus on Eliquis. Patient reported worsening dyspnea and lower extremity edema. On his initial physical examination his blood pressure was 164/104, HR 94, RR 35 and 02 saturation 95%, lungs with no wheezing or rales, positive JVD, heart with S1 and S2 present and rhythmic with no gallops, abdomen with no distention and no lower extremity edema.   Na 139, K 4,1 CL 101 bicarbonate 22, glucose 106 bun 56 cr 5,0  High sensitive troponin 232 and 219  Wbc  10,7 hgb 10,7 plt 255   Chest radiograph with bilateral interstitial infiltrates with cephalization of the vasculature.   EKG 90 bpm, left axis deviation, normal intervals, left anterior fascicular block, sinus rhythm with no significant ST segment or T wave changes.   Creatinine continues to worsen. Supportive care. During hospitalization, patient developed transient aphasia and dysarthria with associated headache in setting of heparin IV infusion; code stroke called which revealed evidence of no acute stroke.  09/18 patient had a temporal HD cathter placement and started on renal replacement therapy.   09/21 patient will need to continue with outpatient HD. He has been started on warfarin for anticoagulation. Outpatient follow up with CT surgery for eventual CABG.  09/22 Pending INR to be therapeutic and outpatient HD.   Assessment and Plan: * Acute kidney injury (AKI) with acute tubular necrosis  (ATN) (HCC) Progressive renal failure, now probably ERSD. Patient has been placed on renal replacement therapy.   Plan to continue with outpatient renal replacement therapy.   Acute on chronic respiratory failure due to acute pulmonary edema. Continue oxymetry monitoring and supplemental 02 per [place] Oxymetry today on room air is 96%   Acute on chronic systolic CHF (congestive heart failure) (HCC) Echocardiogram with reduced LV systolic function with EF 30 to 35%, akinesis of the left ventricular, apical septal wall, inferior wall, anterior wall and lateral wall, akinesis of the left ventricular, entire apical segment. Severe hypokinesis of the left ventricular basal mid anterior wall. RV systolic function preserved. RVSP 45.5   Patient will need to continue with renal replacement therapy for fluid management.  Systolic blood pressure 811 to 140 mmHg. Patient is 3 Kg down since admission.    Continue carvedilol, isosorbide and blood pressure control with amlodipine and hydralazine   LV thrombus, start transition to warfarin, continue with heparin IV bridging.  INR today is 1,1 target 2 to 3.  Coronary atherosclerosis of native coronary artery Patient with severe tow vessel coronary artery disease, will need  CABG. Plan to continue medical therapy, and follow up as outpatient with CT surgery for CABG Currently patient is chest pain free.    Type 2 diabetes mellitus with hyperlipidemia (HCC) Continue glucose cover and monitoring with insulin sliding scale.  Patient with poor appetite Fasting glucose 103 this am.  Continue with nutritional supplements.   Continue with statin therapy.   GERD (gastroesophageal reflux disease) Continue with once daily pantoprazole.   Sleep apnea Patient reports he was unable  to tolerate CPAP previously   Aphasia 09/13 code stroke  MRA and MRI with no acute changes CVA has been ruled out.  Continue close neuro checks.   Depression Plan to  continue with citalopram, clonazepam, and quetiapine.   Malnutrition of moderate degree Continue with nutritional supplementation.         Subjective: Patient is feeling well. He had leg pain during HD yesterday, had fluid removed with good toleration   Physical Exam: Vitals:   06/23/22 1911 06/24/22 0639 06/24/22 0830 06/24/22 0831  BP: (!) 147/93 (!) 130/92 (!) 143/82 (!) 143/82  Pulse:  90 85 89  Resp: '20 20 16   '$ Temp: 98 F (36.7 C) 98 F (36.7 C) 98.8 F (37.1 C)   TempSrc: Oral Oral Oral   SpO2: 92% (!) 57% 92%   Weight:  70.6 kg    Height:       Neurology awake and alert ENT with mild pallor Cardiovascular with S1 and S2 present and rhythmic with no gallops or rubs No JVD No lower extremity edema Respiratory with no rales or wheezing Abdomen with no distention  Data Reviewed:    Family Communication: no family  at the bedside   Disposition: Status is: Inpatient Remains inpatient appropriate because: pending outpatient HD and therapeutic INR   Planned Discharge Destination: Home    Author: Tawni Millers, MD 06/24/2022 10:08 AM  For on call review [email or web address].

## 2022-06-24 NOTE — Assessment & Plan Note (Signed)
Continue with nutritional supplementation.  ?

## 2022-06-24 NOTE — Plan of Care (Signed)
  Problem: Coping: Goal: Ability to adjust to condition or change in health will improve Outcome: Progressing   Problem: Fluid Volume: Goal: Ability to maintain a balanced intake and output will improve Outcome: Progressing   Problem: Health Behavior/Discharge Planning: Goal: Ability to manage health-related needs will improve Outcome: Progressing   

## 2022-06-24 NOTE — Progress Notes (Signed)
ANTICOAGULATION CONSULT NOTE - Follow-up Note  Pharmacy Consult for Heparin, warfarin Indication: chest pain/ACS & LV thrombus  Allergies  Allergen Reactions   Dilaudid [Hydromorphone Hcl] Other (See Comments)    hallucinations   Mirapex [Pramipexole Dihydrochloride] Other (See Comments)    Leg pain   Prednisone Other (See Comments)    irritability    Patient Measurements: Height: '5\' 10"'$  (177.8 cm) Weight: 70.6 kg (155 lb 11.2 oz) IBW/kg (Calculated) : 73 Heparin Dosing Weight: 75.8kg  Vital Signs: Temp: 98.8 F (37.1 C) (09/22 0830) Temp Source: Oral (09/22 0830) BP: 143/82 (09/22 0831) Pulse Rate: 89 (09/22 0831)  Labs: Recent Labs    06/22/22 0521 06/22/22 1338 06/23/22 0651 06/24/22 0411  HGB 9.5*  --  9.6* 9.2*  HCT 27.5*  --  28.1* 27.9*  PLT 192  --  190 184  LABPROT  --   --   --  14.6  INR  --   --   --  1.1  HEPARINUNFRC 0.97* 0.29* 0.31 0.24*  CREATININE 8.14*  --  8.65* 6.11*     Estimated Creatinine Clearance: 10.9 mL/min (A) (by C-G formula based on SCr of 6.11 mg/dL (H)).   Medical History: Past Medical History:  Diagnosis Date   AAA (abdominal aortic aneurysm) (HCC)    Abdominal wall mass of left lower quadrant    Basal cell carcinoma    CAD (coronary artery disease) 2011   moderate   Colon polyp    DDD (degenerative disc disease)    Depression    Diabetes mellitus type II    no meds   GERD (gastroesophageal reflux disease)    Hyperlipidemia    Neuromuscular disorder (HCC)    Osteopenia    PAD (peripheral artery disease) (HCC)    Renal cell carcinoma    Restless leg syndrome    Sleep apnea    used a cpap yr ago-does not snore or use it now.    Medications:  Medications Prior to Admission  Medication Sig Dispense Refill Last Dose   amLODipine (NORVASC) 10 MG tablet Take 10 mg by mouth every evening.   06/11/2022   apixaban (ELIQUIS) 5 MG TABS tablet Take 5 mg by mouth 2 (two) times daily.   06/11/2022 at 2000   atorvastatin  (LIPITOR) 40 MG tablet Take 40 mg by mouth at bedtime.   06/11/2022   citalopram (CELEXA) 40 MG tablet Take 40 mg by mouth every evening.   06/11/2022   clonazePAM (KLONOPIN) 0.5 MG tablet Take 0.5 mg by mouth at bedtime.   06/11/2022   gabapentin (NEURONTIN) 300 MG capsule Take 300 mg by mouth 3 (three) times daily.   06/11/2022   lansoprazole (PREVACID) 15 MG capsule Take 15 mg by mouth every morning.   06/11/2022   nicotine (NICODERM CQ - DOSED IN MG/24 HOURS) 21 mg/24hr patch Place 21 mg onto the skin daily.   06/12/2022   QUEtiapine (SEROQUEL) 300 MG tablet Take 300 mg by mouth at bedtime.   06/11/2022    Scheduled:   amLODipine  10 mg Oral Daily   aspirin EC  81 mg Oral Daily   atorvastatin  40 mg Oral Daily   carvedilol  6.25 mg Oral BID WC   Chlorhexidine Gluconate Cloth  6 each Topical Q0600   Chlorhexidine Gluconate Cloth  6 each Topical Q0600   citalopram  20 mg Oral QPM   clonazePAM  0.5 mg Oral Daily   darbepoetin (ARANESP) injection - NON-DIALYSIS  60  mcg Subcutaneous Q Fri-1800   feeding supplement  237 mL Oral BID BM   furosemide  40 mg Oral BID   hydrALAZINE  25 mg Oral Q8H   insulin aspart  0-15 Units Subcutaneous TID WC   isosorbide mononitrate  15 mg Oral Daily   multivitamin  1 tablet Oral QHS   pantoprazole  40 mg Oral Daily   polyethylene glycol  17 g Oral Daily   QUEtiapine  300 mg Oral QHS   sodium chloride flush  3 mL Intravenous Q12H   Warfarin - Pharmacist Dosing Inpatient   Does not apply q1600   Infusions:   sodium chloride     heparin 1,500 Units/hr (06/23/22 2342)   PRN: sodium chloride, acetaminophen, ondansetron (ZOFRAN) IV, sodium chloride flush  Assessment: 76 yom with a history of LV thrombus (05/2022) on on apixaban prior to arrival (Last dose 9/9 at 2000). He noted with 2V CAD and for possible CABG. Deferring CABG for now until  renal function improved. Heparin per pharmacy consult placed for chest pain/ACS and LV thrombus .  Started warfarin 9/22. INR  1.1  -heparin level= 0.24 on 1500 units/hr  Goal of Therapy:  INR 2-3 Heparin level 0.3-0.7 units/ml Monitor platelets by anticoagulation protocol: Yes   Plan:  -Increse heparin to 1500 units/hr -Heparin level and CBC in am -Warfarin '5mg'$  today -Daily PT/INR  Hildred Laser, PharmD Clinical Pharmacist **Pharmacist phone directory can now be found on [email or web address] ([email or web address]).  Listed under [place].

## 2022-06-24 NOTE — Progress Notes (Signed)
[facility] Progress Note   Subjective:   had HD yest reported cramping even though no UF-  labs reflective of HD yest-  550 of UOP recorded -   remains frustrated-  hoping will be able to go home soon   Objective Vitals:   06/23/22 1911 06/24/22 0639 06/24/22 0830 06/24/22 0831  BP: (!) 147/93 (!) 130/92 (!) 143/82 (!) 143/82  Pulse:  90 85 89  Resp: '20 20 16   '$ Temp: 98 F (36.7 C) 98 F (36.7 C) 98.8 F (37.1 C)   TempSrc: Oral Oral Oral   SpO2: 92% (!) 57% 92%   Weight:  70.6 kg    Height:       Physical Exam General: In bed in no distress-  chronically ill appearing Heart: Normal rate, no rub Lungs: is on side-  crackles on dep side Abdomen: soft, nondistended Extremities: no edema, warm and well perfused Neuro: Minimal tremor, conversant and oriented  Additional Objective Labs: Basic Metabolic Panel: Recent Labs  Lab 06/20/22 1005 06/21/22 0338 06/22/22 0521 06/23/22 0651 06/24/22 0411  NA 138 138 138 136 138  K 4.5 4.2 4.2 4.2 4.3  CL 99 98 99 99 97*  CO2 21* '23 23 22 26  '$ GLUCOSE 135* 102* 105* 107* 103*  BUN 100* 68* 78* 91* 46*  CREATININE 9.26* 6.91* 8.14* 8.65* 6.11*  CALCIUM 9.6 9.0 9.1 9.2 9.1  PHOS 6.2* 5.5* 5.7*  --   --    Liver Function Tests: Recent Labs  Lab 06/20/22 1005 06/21/22 0338 06/22/22 0521  ALBUMIN 3.0* 2.8* 2.7*   No results for input(s): "LIPASE", "AMYLASE" in the last 168 hours. CBC: Recent Labs  Lab 06/20/22 1005 06/21/22 0338 06/22/22 0521 06/23/22 0651 06/24/22 0411  WBC 8.2 9.4 9.7 11.4* 14.4*  HGB 10.3* 9.6* 9.5* 9.6* 9.2*  HCT 30.2* 28.3* 27.5* 28.1* 27.9*  MCV 88.6 87.9 87.0 88.4 89.1  PLT 197 184 192 190 184   Blood Culture No results found for: "SDES", "SPECREQUEST", "CULT", "REPTSTATUS"  Cardiac Enzymes: No results for input(s): "CKTOTAL", "CKMB", "CKMBINDEX", "TROPONINI" in the last 168 hours.  CBG: Recent Labs  Lab 06/23/22 0549 06/23/22 1130 06/23/22 1726 06/23/22 2138  06/24/22 0638  GLUCAP 116* 106* 105* 115* 99   Iron Studies: No results for input(s): "IRON", "TIBC", "TRANSFERRIN", "FERRITIN" in the last 72 hours. '@lablastinr3'$ @ Studies/Results: No results found. Medications:  sodium chloride     heparin 1,500 Units/hr (06/23/22 2342)    amLODipine  10 mg Oral Daily   aspirin EC  81 mg Oral Daily   atorvastatin  40 mg Oral Daily   carvedilol  6.25 mg Oral BID WC   Chlorhexidine Gluconate Cloth  6 each Topical Q0600   Chlorhexidine Gluconate Cloth  6 each Topical Q0600   citalopram  20 mg Oral QPM   clonazePAM  0.5 mg Oral Daily   feeding supplement  237 mL Oral BID BM   hydrALAZINE  25 mg Oral Q8H   insulin aspart  0-15 Units Subcutaneous TID WC   isosorbide mononitrate  15 mg Oral Daily   multivitamin  1 tablet Oral QHS   pantoprazole  40 mg Oral Daily   polyethylene glycol  17 g Oral Daily   QUEtiapine  300 mg Oral QHS   sodium chloride flush  3 mL Intravenous Q12H   Warfarin - Pharmacist Dosing Inpatient   Does not apply [id number]   Assessment/ Plan: AKI on CKD 3b - b/l creatinine 1.6 prior  to recent heart cath.   2 days post cath (creat 2.2) and from 9/5 in ED here (creat 3.3) are not entirely consistent w/ a typical post-contrast ATN / AKI pattern however cholesterol embolic injury is possible. Creat here 5.0  3 wks after heart cath and climbing. Cholesterol embolic injury may not improve fully as CIN/ATN may.  C3  wnl.   UA +small blood (none on microscopy) and 30 protein; FeNa c/w ATN.  Do not suspect GN. Renal US c/w CKD, simple cysts. He had mild uremic symptoms and also issues with oxygenation so decision made to support with HD.  S/p  tunneled dialysis catheter placement and  first HD on 9/18-  second on 9/21- now doing PRN based on labs and UOP-  will also look into arranging OP AKI HD so he can be discharged -  have not heard yet Uncont HTN - getting amlodipine, hydralazine, coreg and imdur titrated by cardiology and BPs now fine.    Volume may be playing a role as well-  look to dec antihypertensives once volume is better.  Since made such good urine also added diuretic but seems to have been stopped- will resume oral dosing  need to watch for hypotension -  will put admin parameter on hydralazine  CAD - sp LHC on 8/22 at [facility] as above; Cardiology following - think he'll need CABG ultimately.  Pre op testing hindered by AKI for now.  CT surgery consulting.  A/C systolic CHF - echo with EF 30-35%.  Meds limited by kidney function.    Acute hypoxic resp failure -overall stable  Anemia-  not too much of an issue but is trending down-  will add esa and check iron stores  Bones-  have not started binder or checked PTH   Louis Meckel

## 2022-06-24 NOTE — Progress Notes (Signed)
   06/24/22 1000  Mobility  Activity Ambulated with assistance in hallway  Level of Assistance Modified independent, requires aide device or extra time  Assistive Device Front wheel walker  Distance Ambulated (ft) 50 ft  Activity Response Tolerated well  $Mobility charge 1 Mobility   Mobility Specialist Progress Note  Pt was in chair and agreeable. Mobility cut short d/t leg soreness. Returned to bed w/ all needs met and call bell in reach.   Lucious Groves Mobility Specialist

## 2022-06-24 NOTE — Progress Notes (Addendum)
Progress Note  Patient Name: Allen Cortez Date of Encounter: 06/24/2022  Primary Cardiologist: None  Subjective   Feeling well today without CP or SOB.  Inpatient Medications    Scheduled Meds:  amLODipine  10 mg Oral Daily   aspirin EC  81 mg Oral Daily   atorvastatin  40 mg Oral Daily   carvedilol  6.25 mg Oral BID WC   Chlorhexidine Gluconate Cloth  6 each Topical Q0600   Chlorhexidine Gluconate Cloth  6 each Topical Q0600   citalopram  20 mg Oral QPM   clonazePAM  0.5 mg Oral Daily   darbepoetin (ARANESP) injection - NON-DIALYSIS  60 mcg Subcutaneous Q Fri-1800   feeding supplement  237 mL Oral BID BM   furosemide  40 mg Oral BID   hydrALAZINE  25 mg Oral Q8H   insulin aspart  0-15 Units Subcutaneous TID WC   isosorbide mononitrate  15 mg Oral Daily   multivitamin  1 tablet Oral QHS   pantoprazole  40 mg Oral Daily   polyethylene glycol  17 g Oral Daily   QUEtiapine  300 mg Oral QHS   sodium chloride flush  3 mL Intravenous Q12H   warfarin  5 mg Oral ONCE-1600   Warfarin - Pharmacist Dosing Inpatient   Does not apply q1600   Continuous Infusions:  sodium chloride     heparin 1,600 Units/hr (06/24/22 1406)   PRN Meds: sodium chloride, acetaminophen, ondansetron (ZOFRAN) IV, sodium chloride flush   Vital Signs    Vitals:   06/24/22 0639 06/24/22 0830 06/24/22 0831 06/24/22 1405  BP: (!) 130/92 (!) 143/82 (!) 143/82 123/85  Pulse: 90 85 89 85  Resp: 20 16    Temp: 98 F (36.7 C) 98.8 F (37.1 C)    TempSrc: Oral Oral    SpO2: (!) 57% 92%  97%  Weight: 70.6 kg     Height:        Intake/Output Summary (Last 24 hours) at 06/24/2022 1459 Last data filed at 06/24/2022 1300 Gross per 24 hour  Intake 220 ml  Output 300 ml  Net -80 ml      06/24/2022    6:39 AM 06/23/2022    4:34 PM 06/23/2022    1:43 PM  Last 3 Weights  Weight (lbs) 155 lb 11.2 oz 156 lb 15.5 oz 156 lb 12 oz  Weight (kg) 70.625 kg 71.2 kg 71.1 kg     Telemetry    NSR occasional  PVCs/couplets - Personally Reviewed  Physical Exam   GEN: No acute distress.  HEENT: Normocephalic, atraumatic, sclera non-icteric. Neck: No JVD or bruits. Cardiac: RRR no murmurs, rubs, or gallops.  Respiratory: Clear to auscultation bilaterally. Breathing is unlabored. GI: Soft, nontender, non-distended, BS +x 4. MS: no deformity. Extremities: No clubbing or cyanosis. No edema. Distal pedal pulses are 2+ and equal bilaterally. Neuro:  AAOx3. Follows commands. Psych:  Responds to questions appropriately with a normal affect.  Labs    High Sensitivity Troponin:   Recent Labs  Lab 06/12/22 0326 06/12/22 0530  TROPONINIHS 232* 219*      Cardiac EnzymesNo results for input(s): "TROPONINI" in the last 168 hours. No results for input(s): "TROPIPOC" in the last 168 hours.   Chemistry Recent Labs  Lab 06/20/22 1005 06/21/22 0338 06/22/22 0521 06/23/22 0651 06/24/22 0411  NA 138 138 138 136 138  K 4.5 4.2 4.2 4.2 4.3  CL 99 98 99 99 97*  CO2 21* '23 23 22 '$ 26  GLUCOSE 135* 102* 105* 107* 103*  BUN 100* 68* 78* 91* 46*  CREATININE 9.26* 6.91* 8.14* 8.65* 6.11*  CALCIUM 9.6 9.0 9.1 9.2 9.1  ALBUMIN 3.0* 2.8* 2.7*  --   --   GFRNONAA 6* 8* 6* 6* 9*  ANIONGAP 18* 17* 16* 15 15     Hematology Recent Labs  Lab 06/22/22 0521 06/23/22 0651 06/24/22 0411  WBC 9.7 11.4* 14.4*  RBC 3.16* 3.18* 3.13*  HGB 9.5* 9.6* 9.2*  HCT 27.5* 28.1* 27.9*  MCV 87.0 88.4 89.1  MCH 30.1 30.2 29.4  MCHC 34.5 34.2 33.0  RDW 15.7* 15.6* 15.7*  PLT 192 190 184    BNP Recent Labs  Lab 06/19/22 0301  BNP 1,453.6*     DDimer No results for input(s): "DDIMER" in the last 168 hours.   Radiology    No results found.  Cardiac Studies   Echo 06/12/22:    1. 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  Left ventricular ejection fraction, by estimation, is 30-35%. The left  ventricle is  abnormal. The left  ventricle has focal regional wall motion abnormalities. The left  ventricular internal cavity size was mildly dilated. There is akinesis of  the left ventricular, apical septal wall, inferior wall, anterior wall and  lateral wall. There is akinesis of the  left ventricular, entire apical segment. There is severe hypokinesis of  the left ventricular, basal-mid anterior wall. A falst tendon is present  in the mid LV cavity.   2. Right ventricular systolic function is normal. The right ventricular  size is normal. There is moderately elevated pulmonary artery systolic  pressure. The estimated right ventricular systolic pressure is 62.3 mmHg.   3. The mitral valve is normal in structure. Mild mitral valve  regurgitation. No evidence of mitral stenosis.   4. The aortic valve is tricuspid. There is moderate calcification of the  aortic valve. Aortic valve regurgitation is not visualized. Aortic valve  sclerosis/calcification is present, without any evidence of aortic  stenosis.   5. The inferior vena cava is dilated in size with >50% respiratory  variability, suggesting right atrial pressure of 8 mmHg.   Patient Profile     71 y.o. male with prior nonobstructive CAD 2011, AAA s/p repair around 2014-2015, hypertension, hyperlipidemia, type 2 diabetes, obstructive sleep apnea, GERD renal cell carcinoma s/p partial left nephrectomy around 2015-2016 now with CKD stage IIIa, abdominal wall mass of left lower quadrant, depression, RLS recently had OP workup to f/u AAA and was found to have LV thrombus and was started on Eliquis. Subsequently underwent cath at [facility] and was referred to Dr. Kipp Brood for consideration of CABG. Notes indicate . Cardiac cath at the [facility] on 05/31/22 with CTO of the LAD and severe mid Circumflex stenosis. Post cath he developed neural lower extremity pain and AKI. Admitted 06/12/2022 with worsening DOE, was hypertensive on 5L [place] upon time of consultation. CXR showed  bilateral interstitial infiltrates with cephalization of vasculature felt due to pulmonary edema. Hospital course includes prorgessive AKI with ATN now started on HD (felt to be ESRD), acute on chronic systolic CHF, transition from Eliquis to Coumadin. Had aphasia on 9/13 code stroke called but CVA ruled out.    Assessment & Plan    1. CAD with angina: He has severe two vessel CAD by cath at the [facility]. His coronary anatomy was felt favorable for  CABG but this has been delayed due to renal failure. Per notes, consider cardiac MRI for viability of the anterior wall. Now requiring HD. Per Dr. Moshe Cipro, [place] to proceed with cardiac MRI. We do not have the ability to do this over the weekend so will order for Monday (clarifying the order name), [place] aware - continue ASA, atorvastatin, carvedilol, Imdur - see #2 regarding additional meds   2. Ischemic cardiomyopathy/Acute systolic CHF/HTN - Echo 6/78/93 LVEF 30-35% with clear evidence of LV thrombus. Akinesis of the anterior wall. No significant valve disease. He does not appear volume overloaded on exam.  - volume management per nephrology - avoid ACEi/ARB/ARNI/spiro given renal disease - current medical therapy includes amlodipine, carvedilol, Imdur, hydralazine but not receiving hydralazine due to hold parameters for SBP<135 - given LV dysfunction we'll stop amlodipine in hopes to be able to administer the hydralazine going forward as part of GDMT for his cardiomyopathy   3. Progressive renal failure, initiated HD this admission - per nephrology notes they anticipate OP AKI HD on MWF schedule - they are starting Lasix today  4. LV thrombus - diagnosed at the [place] in August 2023 and had been on Eliquis which is now held - recommended to transition IV heparin to Coumadin given poor data in use of DOACs for LV thrombus - INR 1.1 today, pharm following  5. AAA s/p remote repair with recurrence - family clarifies the repeat size last month was 4.2cm and  that no specific intervention was felt needed, only to follow going forward   For questions or updates, please contact [place] Please consult [email or web address] for contact info under Cardiology/STEMI.  Signed, Charlie Pitter, PA-C 06/24/2022, 2:59 PM     Patient seen and examined. Agree with assessment and plan.  No significant chest pain or shortness of breath.  Patient continues to be on dialysis.  Creatinine 6.11 today.  We have been given approval per Dr. Moshe Cipro to proceed with cardiac MRI to assess for viability potential candidacy for CABG revascularization surgery with Dr. Kipp Brood saw the patient for cardiovascular surgical consultation on June 14, 2022.  Would favor keeping the patient in the hospital over the weekend and schedule for this study to be done on Monday a.m. if at all possible.  Patient can undergo dialysis subsequent to his study.  Patient had undergone diagnostic catheterization at the [facility].  He is now on warfarin initiation for anticoagulation with LV thrombus.  I discussed the above scenario at length with the patient's family members who are in the room.   Troy Sine, MD, [facility] 06/24/2022 3:31 PM

## 2022-06-24 NOTE — TOC Progression Note (Addendum)
Transition of Care [facility]) - Progression Note    Patient Details  Name: Allen Cortez MRN: [id number] Date of Birth: [birth year]  Transition of [facility]) CM/SW Contact  Zenon Mayo, RN Phone Number: 06/24/2022, 2:53 PM  Clinical Narrative:    NCM spoke with patient , wife at the bedside, wife states they would like to have a rolling walker, a light weight w/chair and a hospital bed, and possibly will need home oxygen.  She states she does not have a preference of which DME agency supplies the DME.  She also states she will need HHPT and HHAIDE.  NCM offered choice  with [email or web address] list, she is looking over list, will check back in half an hour.  Also patient has Medicare and would like for the DME to go thru his Medicare and the [facility] to go thru his medicare.  Medicare ID is [id number].  NCM ordered the walker thru [employer] for [employer].  The other DME was ordered thru epic with [employer] with Thedore Mins. NCM checked back with wife, she states she would like [place] for [facility] services.  NCM will make referral to [facility]. NCM made referral to [facility] with Alvis Lemmings, he can take referral for HHPT, HHAIDE.  Soc will begin 24 to 48 hrs post dc.         Expected Discharge Plan and Services                                                 Social Determinants of Health (SDOH) Interventions    Readmission Risk Interventions     No data to display

## 2022-06-24 NOTE — Progress Notes (Signed)
See that plans have been made for OP AKI HD on a MWF schedule  I am going to tentatively order HD tomorrow but look at labs and UOP first-  if he requires will do and then from my standpoint could be discharged to AKI HD at [facility] on Monday  [place]

## 2022-06-24 NOTE — Progress Notes (Signed)
    Durable Medical Equipment  (From admission, onward)           Start     Ordered   06/24/22 1429  For home use only DME Hospital bed  Once       Question Answer Comment  Length of Need Lifetime   Patient has (list medical condition): CHF   The above medical condition requires: Patient requires the ability to reposition frequently   Head must be elevated greater than: 30 degrees   Bed type Semi-electric   Support Surface: Gel Overlay      06/24/22 1429   06/24/22 1427  For home use only DME lightweight manual wheelchair with seat cushion  Once       Comments: Patient suffers from weakness which impairs their ability to perform daily activities like bathing, dressing, grooming, and toileting in the home.  A cane will not resolve  issue with performing activities of daily living. A wheelchair will allow patient to safely perform daily activities. Patient is not able to propel themselves in the home using a standard weight wheelchair due to general weakness. Patient can self propel in the lightweight wheelchair. Length of need Lifetime. Accessories: elevating leg rests (ELRs), wheel locks, extensions and anti-tippers.   06/24/22 1428   06/24/22 1426  For home use only DME Walker rolling  Once       Question Answer Comment  Walker: With [place]   Patient needs a walker to treat with the following condition Weakness      06/24/22 1428

## 2022-06-25 DIAGNOSIS — I5023 Acute on chronic systolic (congestive) heart failure: Secondary | ICD-10-CM | POA: Diagnosis not present

## 2022-06-25 DIAGNOSIS — I214 Non-ST elevation (NSTEMI) myocardial infarction: Secondary | ICD-10-CM | POA: Diagnosis not present

## 2022-06-25 DIAGNOSIS — I5031 Acute diastolic (congestive) heart failure: Secondary | ICD-10-CM | POA: Diagnosis not present

## 2022-06-25 DIAGNOSIS — N170 Acute kidney failure with tubular necrosis: Secondary | ICD-10-CM | POA: Diagnosis not present

## 2022-06-25 LAB — RENAL FUNCTION PANEL
Albumin: 2.8 g/dL — ABNORMAL LOW (ref 3.5–5.0)
Anion gap: 17 — ABNORMAL HIGH (ref 5–15)
BUN: 65 mg/dL — ABNORMAL HIGH (ref 8–23)
CO2: 23 mmol/L (ref 22–32)
Calcium: 9.2 mg/dL (ref 8.9–10.3)
Chloride: 96 mmol/L — ABNORMAL LOW (ref 98–111)
Creatinine, Ser: 7.65 mg/dL — ABNORMAL HIGH (ref 0.61–1.24)
GFR, Estimated: 7 mL/min — ABNORMAL LOW (ref >60)
Glucose, Bld: 99 mg/dL (ref 70–99)
Phosphorus: 5.5 mg/dL — ABNORMAL HIGH (ref 2.5–4.6)
Potassium: 4.3 mmol/L (ref 3.5–5.1)
Sodium: 136 mmol/L (ref 135–145)

## 2022-06-25 LAB — CBC
HCT: 27.9 % — ABNORMAL LOW (ref 39.0–52.0)
Hemoglobin: 9 g/dL — ABNORMAL LOW (ref 13.0–17.0)
MCH: 29.3 pg (ref 26.0–34.0)
MCHC: 32.3 g/dL (ref 30.0–36.0)
MCV: 90.9 fL (ref 80.0–100.0)
Platelets: 202 10*3/uL (ref 150–400)
RBC: 3.07 MIL/uL — ABNORMAL LOW (ref 4.22–5.81)
RDW: 15.8 % — ABNORMAL HIGH (ref 11.5–15.5)
WBC: 14.3 10*3/uL — ABNORMAL HIGH (ref 4.0–10.5)
nRBC: 0 % (ref 0.0–0.2)

## 2022-06-25 LAB — PROTIME-INR
INR: 1.9 — ABNORMAL HIGH (ref 0.8–1.2)
Prothrombin Time: 21.7 seconds — ABNORMAL HIGH (ref 11.4–15.2)

## 2022-06-25 LAB — GLUCOSE, CAPILLARY
Glucose-Capillary: 105 mg/dL — ABNORMAL HIGH (ref 70–99)
Glucose-Capillary: 120 mg/dL — ABNORMAL HIGH (ref 70–99)
Glucose-Capillary: 127 mg/dL — ABNORMAL HIGH (ref 70–99)

## 2022-06-25 LAB — IRON AND TIBC
Iron: 25 ug/dL — ABNORMAL LOW (ref 45–182)
Saturation Ratios: 9 % — ABNORMAL LOW (ref 17.9–39.5)
TIBC: 272 ug/dL (ref 250–450)
UIBC: 247 ug/dL

## 2022-06-25 LAB — HEPARIN LEVEL (UNFRACTIONATED): Heparin Unfractionated: 0.38 IU/mL (ref 0.30–0.70)

## 2022-06-25 LAB — FERRITIN: Ferritin: 130 ng/mL (ref 24–336)

## 2022-06-25 MED ORDER — HEPARIN SODIUM (PORCINE) 1000 UNIT/ML IJ SOLN
INTRAMUSCULAR | Status: AC
  Administered 2022-06-25: 3200 [IU]
  Filled 2022-06-25: qty 4

## 2022-06-25 MED ORDER — SODIUM CHLORIDE 0.9 % IV SOLN
250.0000 mg | Freq: Once | INTRAVENOUS | Status: AC
  Administered 2022-06-25: 250 mg via INTRAVENOUS
  Filled 2022-06-25: qty 20

## 2022-06-25 NOTE — Progress Notes (Signed)
SATURATION QUALIFICATIONS: (This note is used to comply with regulatory documentation for home oxygen)  Patient Saturations on Room Air at Rest = 94%  Patient Saturations on Room Air while Ambulating = 90%  Patient Saturations on 2 Liters of oxygen while Ambulating = 92%  : Irven Baltimore, RN

## 2022-06-25 NOTE — Plan of Care (Signed)
  Problem: Coping: Goal: Ability to adjust to condition or change in health will improve Outcome: Progressing   Problem: Health Behavior/Discharge Planning: Goal: Ability to manage health-related needs will improve Outcome: Progressing   Problem: Fluid Volume: Goal: Ability to maintain a balanced intake and output will improve Outcome: Progressing   Problem: Metabolic: Goal: Ability to maintain appropriate glucose levels will improve Outcome: Progressing

## 2022-06-25 NOTE — Progress Notes (Addendum)
Progress Note   Patient: Allen Cortez IDP:[id number] DOB: June 20, 1951 DOA: 06/12/2022     13 DOS: the patient was seen and examined on 06/25/2022   Brief hospital course: U[place]ANO [place] was admitted to the hospital with the working diagnosis of acute decompensation of heart failure. Progressive renal failure now on ERSD.   71 y.o. male with a history of CAD, AAA s/p repair, hypertension, hyperlipidemia, diabetes mellitus type 2, OSA, GERD, renal cell carcinoma s/p partial left nephrectomy, CKD stage IIIa, abdominal wall mass, depression, LV thrombus on Eliquis. Patient reported worsening dyspnea and lower extremity edema. On his initial physical examination his blood pressure was 164/104, HR 94, RR 35 and 02 saturation 95%, lungs with no wheezing or rales, positive JVD, heart with S1 and S2 present and rhythmic with no gallops, abdomen with no distention and no lower extremity edema.   Na 139, K 4,1 CL 101 bicarbonate 22, glucose 106 bun 56 cr 5,0  High sensitive troponin 232 and 219  Wbc  10,7 hgb 10,7 plt 255   Chest radiograph with bilateral interstitial infiltrates with cephalization of the vasculature.   EKG 90 bpm, left axis deviation, normal intervals, left anterior fascicular block, sinus rhythm with no significant ST segment or T wave changes.   Creatinine continues to worsen. Supportive care. During hospitalization, patient developed transient aphasia and dysarthria with associated headache in setting of heparin IV infusion; code stroke called which revealed evidence of no acute stroke.  09/18 patient had a temporal HD cathter placement and started on renal replacement therapy.   09/21 patient will need to continue with outpatient HD. He has been started on warfarin for anticoagulation. Outpatient follow up with CT surgery for eventual CABG.  09/22 Pending INR to be therapeutic and outpatient HD.  09/23 plan for patient to have cardiac MRI inpatient on Monday before discharge  home.   Assessment and Plan: * Acute kidney injury (AKI) with acute tubular necrosis (ATN) (HCC) Progressive renal failure, now probably ERSD. Patient has been placed on renal replacement therapy.   Plan to continue with outpatient renal replacement therapy.  Continue with furosemide po.   Acute on chronic respiratory failure due to acute pulmonary edema. Continue oxymetry monitoring and supplemental 02 per [place] Oxymetry today on room air is 96%  Anemia of chronic renal disease, continue with EPO and Iron supplementation. (may need to hold further doses, patient pending for cardiac MRI).   Acute on chronic systolic CHF (congestive heart failure) (HCC) Echocardiogram with reduced LV systolic function with EF 30 to 35%, akinesis of the left ventricular, apical septal wall, inferior wall, anterior wall and lateral wall, akinesis of the left ventricular, entire apical segment. Severe hypokinesis of the left ventricular basal mid anterior wall. RV systolic function preserved. RVSP 45.5   Patient will need to continue with renal replacement therapy for fluid management.  Systolic blood pressure 443 to 140 mmHg. Patient is 3 Kg down since admission.    Continue carvedilol, isosorbide and blood pressure control with  hydralazine   LV thrombus, start transition to warfarin, continue with heparin IV bridging.  INR today is 1,9 target 2 to 3.  Coronary atherosclerosis of native coronary artery Patient with severe tow vessel coronary artery disease, will need  CABG. Plan to continue medical therapy, and follow up as outpatient with CT surgery for CABG Currently patient is chest pain free.  Plan to get cardiac MRI before his discharge.    Type 2 diabetes mellitus with hyperlipidemia (  [place]) Continue glucose cover and monitoring with insulin sliding scale.  Patient with poor appetite Fasting glucose 99 this am.  Continue with nutritional supplements.   Continue with statin therapy.   GERD  (gastroesophageal reflux disease) Continue with once daily pantoprazole.   Sleep apnea Patient reports he was unable to tolerate CPAP previously   Aphasia 09/13 code stroke  MRA and MRI with no acute changes CVA has been ruled out.  Continue close neuro checks.   Depression Plan to continue with citalopram, clonazepam, and quetiapine.   Malnutrition of moderate degree Continue with nutritional supplementation.         Subjective: Patient is feeling well, continue with poor appetite, no dyspnea or chest pain   Physical Exam: Vitals:   06/24/22 1405 06/24/22 1718 06/24/22 2052 06/25/22 0500  BP: 123/85 (!) 141/90 131/79 130/79  Pulse: 85 83 88 72  Resp:   20   Temp:   (!) 97.5 F (36.4 C) 98 F (36.7 C)  TempSrc:   Oral Oral  SpO2: 97%  90% 91%  Weight:    70.5 kg  Height:       Neurology awake and alert ENT with mild pallor Cardiovascular with S1 and S2 present and rhythmic with no gallops Respiratory with no rales or wheezing Abdomen with no distention No lower extremity edema  Data Reviewed:    Family Communication: no family at the bedside   Disposition: Status is: Inpatient Remains inpatient appropriate because: pending cardiac MRI,   Planned Discharge Destination: Home  Author: Tawni Millers, MD 06/25/2022 10:33 AM  For on call review [email or web address].

## 2022-06-25 NOTE — Progress Notes (Signed)
ANTICOAGULATION CONSULT NOTE - Follow-up Note  Pharmacy Consult for Heparin, warfarin Indication: chest pain/ACS & LV thrombus  Allergies  Allergen Reactions   Dilaudid [Hydromorphone Hcl] Other (See Comments)    hallucinations   Mirapex [Pramipexole Dihydrochloride] Other (See Comments)    Leg pain   Prednisone Other (See Comments)    irritability    Patient Measurements: Height: '5\' 10"'$  (177.8 cm) Weight: 70.5 kg (155 lb 6.8 oz) (pt did not want to stand wanted to wait till later bed weight done) IBW/kg (Calculated) : 73 Heparin Dosing Weight: 75.8kg  Vital Signs: Temp: 98 F (36.7 C) (09/23 0500) Temp Source: Oral (09/23 0500) BP: 130/79 (09/23 0500) Pulse Rate: 72 (09/23 0500)  Labs: Recent Labs    06/23/22 0651 06/24/22 0411 06/25/22 0347  HGB 9.6* 9.2* 9.0*  HCT 28.1* 27.9* 27.9*  PLT 190 184 202  LABPROT  --  14.6 21.7*  INR  --  1.1 1.9*  HEPARINUNFRC 0.31 0.24* 0.38  CREATININE 8.65* 6.11* 7.65*     Estimated Creatinine Clearance: 8.7 mL/min (A) (by C-G formula based on SCr of 7.65 mg/dL (H)).   Medical History: Past Medical History:  Diagnosis Date   AAA (abdominal aortic aneurysm) (HCC)    Abdominal wall mass of left lower quadrant    Basal cell carcinoma    CAD (coronary artery disease) 2011   moderate   Colon polyp    DDD (degenerative disc disease)    Depression    Diabetes mellitus type II    no meds   GERD (gastroesophageal reflux disease)    Hyperlipidemia    Neuromuscular disorder (HCC)    Osteopenia    PAD (peripheral artery disease) (HCC)    Renal cell carcinoma    Restless leg syndrome    Sleep apnea    used a cpap yr ago-does not snore or use it now.    Medications:  Medications Prior to Admission  Medication Sig Dispense Refill Last Dose   amLODipine (NORVASC) 10 MG tablet Take 10 mg by mouth every evening.   06/11/2022   apixaban (ELIQUIS) 5 MG TABS tablet Take 5 mg by mouth 2 (two) times daily.   06/11/2022 at 2000    atorvastatin (LIPITOR) 40 MG tablet Take 40 mg by mouth at bedtime.   06/11/2022   citalopram (CELEXA) 40 MG tablet Take 40 mg by mouth every evening.   06/11/2022   clonazePAM (KLONOPIN) 0.5 MG tablet Take 0.5 mg by mouth at bedtime.   06/11/2022   gabapentin (NEURONTIN) 300 MG capsule Take 300 mg by mouth 3 (three) times daily.   06/11/2022   lansoprazole (PREVACID) 15 MG capsule Take 15 mg by mouth every morning.   06/11/2022   nicotine (NICODERM CQ - DOSED IN MG/24 HOURS) 21 mg/24hr patch Place 21 mg onto the skin daily.   06/12/2022   QUEtiapine (SEROQUEL) 300 MG tablet Take 300 mg by mouth at bedtime.   06/11/2022    Scheduled:   aspirin EC  81 mg Oral Daily   atorvastatin  40 mg Oral Daily   carvedilol  6.25 mg Oral BID WC   Chlorhexidine Gluconate Cloth  6 each Topical Q0600   Chlorhexidine Gluconate Cloth  6 each Topical Q0600   Chlorhexidine Gluconate Cloth  6 each Topical Q0600   citalopram  20 mg Oral QPM   clonazePAM  0.5 mg Oral Daily   darbepoetin (ARANESP) injection - NON-DIALYSIS  60 mcg Subcutaneous Q Fri-1800   feeding supplement  237 mL Oral BID BM   furosemide  40 mg Oral BID   hydrALAZINE  25 mg Oral Q8H   insulin aspart  0-15 Units Subcutaneous TID WC   isosorbide mononitrate  15 mg Oral Daily   multivitamin  1 tablet Oral QHS   pantoprazole  40 mg Oral Daily   polyethylene glycol  17 g Oral Daily   QUEtiapine  300 mg Oral QHS   sodium chloride flush  3 mL Intravenous Q12H   Warfarin - Pharmacist Dosing Inpatient   Does not apply q1600   Infusions:   sodium chloride     heparin 1,600 Units/hr (06/25/22 0926)   PRN: sodium chloride, acetaminophen, ondansetron (ZOFRAN) IV, sodium chloride flush  Assessment: 55 yom with a history of LV thrombus (05/2022) on on apixaban prior to arrival (Last dose 9/9 at 2000). He noted with 2V CAD and for possible CABG. Deferring CABG for now until  renal function improved. Heparin per pharmacy consult placed for chest pain/ACS and LV  thrombus .  Started warfarin 9/21. INR 1.1>1.9 from yesterday. Will hold warfarin tonight as patient will likely have a similar increase in INR tomorrow due to '5mg'$  dose given on both 9/21 and 9/22.   Heparin level today is therapeutic at 0.38, on 1600 units/hr. Hgb 9, plt 202--stable. No line issues or signs/symptoms of bleeding reported.  Goal of Therapy:  INR 2-3 Heparin level 0.3-0.7 units/ml Monitor platelets by anticoagulation protocol: Yes   Plan:  -Continue heparin gtt @ 1600 units/hr -Hold warfarin tonight  -Monitor heparin level, CBC, INR, s/sx of bleeding daily    Billey Gosling, PharmD PGY1 Pharmacy Resident 9/23/202312:11 PM

## 2022-06-25 NOTE — Plan of Care (Signed)
  Problem: Coping: Goal: Ability to adjust to condition or change in health will improve Outcome: Progressing   Problem: Health Behavior/Discharge Planning: Goal: Ability to identify and utilize available resources and services will improve Outcome: Progressing   Problem: Health Behavior/Discharge Planning: Goal: Ability to manage health-related needs will improve Outcome: Progressing

## 2022-06-25 NOTE — Progress Notes (Signed)
[facility] Progress Note   Subjective:   no acute events overnight-  no UOP recorded but he says he is making-  BUN and crt did rise from yesterday to today so needs a supportive HD treatment today   Objective Vitals:   06/24/22 1405 06/24/22 1718 06/24/22 2052 06/25/22 0500  BP: 123/85 (!) 141/90 131/79 130/79  Pulse: 85 83 88 72  Resp:   20   Temp:   (!) 97.5 F (36.4 C) 98 F (36.7 C)  TempSrc:   Oral Oral  SpO2: 97%  90% 91%  Weight:    70.5 kg  Height:       Physical Exam General: In bed in no distress-  chronically ill appearing Heart: Normal rate, no rub Lungs: is on side-  crackles on dep side Abdomen: soft, nondistended Extremities: no edema, warm and well perfused Neuro: Minimal tremor, conversant and oriented  Additional Objective Labs: Basic Metabolic Panel: Recent Labs  Lab 06/21/22 0338 06/22/22 0521 06/23/22 0651 06/24/22 0411 06/25/22 0347  NA 138 138 136 138 136  K 4.2 4.2 4.2 4.3 4.3  CL 98 99 99 97* 96*  CO2 '23 23 22 26 23  '$ GLUCOSE 102* 105* 107* 103* 99  BUN 68* 78* 91* 46* 65*  CREATININE 6.91* 8.14* 8.65* 6.11* 7.65*  CALCIUM 9.0 9.1 9.2 9.1 9.2  PHOS 5.5* 5.7*  --   --  5.5*   Liver Function Tests: Recent Labs  Lab 06/21/22 0338 06/22/22 0521 06/25/22 0347  ALBUMIN 2.8* 2.7* 2.8*   No results for input(s): "LIPASE", "AMYLASE" in the last 168 hours. CBC: Recent Labs  Lab 06/21/22 0338 06/22/22 0521 06/23/22 0651 06/24/22 0411 06/25/22 0347  WBC 9.4 9.7 11.4* 14.4* 14.3*  HGB 9.6* 9.5* 9.6* 9.2* 9.0*  HCT 28.3* 27.5* 28.1* 27.9* 27.9*  MCV 87.9 87.0 88.4 89.1 90.9  PLT 184 192 190 184 202   Blood Culture No results found for: "SDES", "SPECREQUEST", "CULT", "REPTSTATUS"  Cardiac Enzymes: No results for input(s): "CKTOTAL", "CKMB", "CKMBINDEX", "TROPONINI" in the last 168 hours.  CBG: Recent Labs  Lab 06/24/22 0638 06/24/22 1130 06/24/22 1622 06/24/22 2100 06/25/22 0623  GLUCAP 99 139* 97 135*  105*   Iron Studies:  Recent Labs    06/25/22 0347  IRON 25*  TIBC 272  FERRITIN 130   '@lablastinr3'$ @ Studies/Results: No results found. Medications:  sodium chloride     heparin 1,600 Units/hr (06/24/22 2100)    aspirin EC  81 mg Oral Daily   atorvastatin  40 mg Oral Daily   carvedilol  6.25 mg Oral BID WC   Chlorhexidine Gluconate Cloth  6 each Topical Q0600   Chlorhexidine Gluconate Cloth  6 each Topical Q0600   Chlorhexidine Gluconate Cloth  6 each Topical Q0600   citalopram  20 mg Oral QPM   clonazePAM  0.5 mg Oral Daily   darbepoetin (ARANESP) injection - NON-DIALYSIS  60 mcg Subcutaneous Q Fri-1800   feeding supplement  237 mL Oral BID BM   furosemide  40 mg Oral BID   hydrALAZINE  25 mg Oral Q8H   insulin aspart  0-15 Units Subcutaneous TID WC   isosorbide mononitrate  15 mg Oral Daily   multivitamin  1 tablet Oral QHS   pantoprazole  40 mg Oral Daily   polyethylene glycol  17 g Oral Daily   QUEtiapine  300 mg Oral QHS   sodium chloride flush  3 mL Intravenous Q12H   Warfarin - Pharmacist Dosing  Inpatient   Does not apply q1600   Assessment/ Plan: AKI on CKD 3b - b/l creatinine 1.6 prior to recent heart cath.   2 days post cath (creat 2.2) and from 9/5 in ED here (creat 3.3) are not entirely consistent w/ a typical post-contrast ATN / AKI pattern however cholesterol embolic injury is possible. Creat here 5.0  3 wks after heart cath and climbing. Cholesterol embolic injury may not improve fully as CIN/ATN may.  C3  wnl.   UA +small blood (none on microscopy) and 30 protein; FeNa c/w ATN.  Renal US c/w CKD, simple cysts. He had mild uremic symptoms and also issues with oxygenation so decision made to support with HD.  S/p  [facility] placement and  first HD on 9/18-  second on 9/21- BUN and crt rising off of HD-  will plan for tx #3 today -  then have been able to arrange OP AKI HD at [facility] starting on Monday.  From my standpoint would be OK for discharge to go to OP unit on  Mon Uncont HTN - getting amlodipine, hydralazine, coreg and imdur titrated by cardiology and BPs now fine.   Volume may be playing a role as well-  look to dec antihypertensives once volume is better.  Since made such good urine also added diuretic - lasix 40 BID PO  need to watch for hypotension -  will put admin parameter on hydralazine  CAD - sp LHC on 8/22 at [facility] as above; Cardiology following - think he'll need CABG ultimately.  Pre op testing hindered by AKI for now.  CT surgery consulting. I would say if not recovered renal function in a month he is unlikely to recover and would proceed with CABG with the designation of ESRD A/C systolic CHF - echo with EF 30-35%.  Meds limited by kidney function.    Acute hypoxic resp failure -overall stable  Anemia-  not too much of an issue but is trending down-   added esa -  iron stores low-  will give dose of iron today  Bones-  have not started binder or checked PTH   Allen Cortez

## 2022-06-26 DIAGNOSIS — I214 Non-ST elevation (NSTEMI) myocardial infarction: Secondary | ICD-10-CM | POA: Diagnosis not present

## 2022-06-26 DIAGNOSIS — I5023 Acute on chronic systolic (congestive) heart failure: Secondary | ICD-10-CM | POA: Diagnosis not present

## 2022-06-26 DIAGNOSIS — N170 Acute kidney failure with tubular necrosis: Secondary | ICD-10-CM | POA: Diagnosis not present

## 2022-06-26 DIAGNOSIS — I255 Ischemic cardiomyopathy: Secondary | ICD-10-CM | POA: Diagnosis not present

## 2022-06-26 DIAGNOSIS — I5031 Acute diastolic (congestive) heart failure: Secondary | ICD-10-CM | POA: Diagnosis not present

## 2022-06-26 DIAGNOSIS — I25118 Atherosclerotic heart disease of native coronary artery with other forms of angina pectoris: Secondary | ICD-10-CM | POA: Diagnosis not present

## 2022-06-26 LAB — PROTIME-INR
INR: 2.2 — ABNORMAL HIGH (ref 0.8–1.2)
Prothrombin Time: 24.1 seconds — ABNORMAL HIGH (ref 11.4–15.2)

## 2022-06-26 LAB — RENAL FUNCTION PANEL
Albumin: 2.7 g/dL — ABNORMAL LOW (ref 3.5–5.0)
Anion gap: 13 (ref 5–15)
BUN: 38 mg/dL — ABNORMAL HIGH (ref 8–23)
CO2: 26 mmol/L (ref 22–32)
Calcium: 8.8 mg/dL — ABNORMAL LOW (ref 8.9–10.3)
Chloride: 97 mmol/L — ABNORMAL LOW (ref 98–111)
Creatinine, Ser: 5.6 mg/dL — ABNORMAL HIGH (ref 0.61–1.24)
GFR, Estimated: 10 mL/min — ABNORMAL LOW (ref >60)
Glucose, Bld: 115 mg/dL — ABNORMAL HIGH (ref 70–99)
Phosphorus: 5.1 mg/dL — ABNORMAL HIGH (ref 2.5–4.6)
Potassium: 4.3 mmol/L (ref 3.5–5.1)
Sodium: 136 mmol/L (ref 135–145)

## 2022-06-26 LAB — GLUCOSE, CAPILLARY
Glucose-Capillary: 105 mg/dL — ABNORMAL HIGH (ref 70–99)
Glucose-Capillary: 113 mg/dL — ABNORMAL HIGH (ref 70–99)
Glucose-Capillary: 133 mg/dL — ABNORMAL HIGH (ref 70–99)
Glucose-Capillary: 99 mg/dL (ref 70–99)

## 2022-06-26 LAB — CBC
HCT: 27.5 % — ABNORMAL LOW (ref 39.0–52.0)
Hemoglobin: 9.2 g/dL — ABNORMAL LOW (ref 13.0–17.0)
MCH: 30.6 pg (ref 26.0–34.0)
MCHC: 33.5 g/dL (ref 30.0–36.0)
MCV: 91.4 fL (ref 80.0–100.0)
Platelets: 162 10*3/uL (ref 150–400)
RBC: 3.01 MIL/uL — ABNORMAL LOW (ref 4.22–5.81)
RDW: 15.9 % — ABNORMAL HIGH (ref 11.5–15.5)
WBC: 12.1 10*3/uL — ABNORMAL HIGH (ref 4.0–10.5)
nRBC: 0 % (ref 0.0–0.2)

## 2022-06-26 LAB — HEPARIN LEVEL (UNFRACTIONATED): Heparin Unfractionated: 0.42 IU/mL (ref 0.30–0.70)

## 2022-06-26 MED ORDER — WARFARIN SODIUM 3 MG PO TABS
3.0000 mg | ORAL_TABLET | Freq: Once | ORAL | Status: AC
  Administered 2022-06-26: 3 mg via ORAL
  Filled 2022-06-26: qty 1

## 2022-06-26 MED ORDER — CHLORHEXIDINE GLUCONATE CLOTH 2 % EX PADS
6.0000 | MEDICATED_PAD | Freq: Every day | CUTANEOUS | Status: DC
  Administered 2022-06-27: 6 via TOPICAL

## 2022-06-26 NOTE — Plan of Care (Signed)
  Problem: Coping: Goal: Ability to adjust to condition or change in health will improve Outcome: Progressing   Problem: Health Behavior/Discharge Planning: Goal: Ability to identify and utilize available resources and services will improve Outcome: Progressing Goal: Ability to manage health-related needs will improve Outcome: Progressing   

## 2022-06-26 NOTE — Progress Notes (Signed)
Cardiology:  Allen Cortez  Subjective:  Denies SSCP, palpitations or Dyspnea Unfortunately he got IV iron yesterday and cannot get cardiac MRI  Objective:  Vitals:   06/25/22 2111 06/26/22 0158 06/26/22 0503 06/26/22 0808  BP: (!) 133/90  139/84 (!) 149/90  Pulse:    91  Resp:   (!) 22 16  Temp:   98.2 F (36.8 C)   TempSrc:   Oral   SpO2:   95% 91%  Weight:  70 kg    Height:        Intake/Output from previous day:  Intake/Output Summary (Last 24 hours) at 06/26/2022 1059 Last data filed at 06/26/2022 1044 Gross per 24 hour  Intake 750.26 ml  Output 601 ml  Net 149.26 ml    Physical Exam: Affect appropriate Healthy:  appears stated age HEENT: normal Neck supple with no adenopathy JVP normal no bruits no thyromegaly Lungs clear with no wheezing and good diaphragmatic motion Heart:  S1/S2 no murmur, no rub, gallop or click PMI normal Abdomen: benighn, BS positve, no tenderness, no AAA no bruit.  No HSM or HJR Distal pulses intact with no bruits No edema Neuro non-focal Skin warm and dry No muscular weakness   Lab Results: Basic Metabolic Panel: Recent Labs    06/24/22 0411 06/25/22 0347  NA 138 136  K 4.3 4.3  CL 97* 96*  CO2 26 23  GLUCOSE 103* 99  BUN 46* 65*  CREATININE 6.11* 7.65*  CALCIUM 9.1 9.2  PHOS  --  5.5*   Liver Function Tests: Recent Labs    06/25/22 0347  ALBUMIN 2.8*   No results for input(s): "LIPASE", "AMYLASE" in the last 72 hours. CBC: Recent Labs    06/25/22 0347 06/26/22 0700  WBC 14.3* 12.1*  HGB 9.0* 9.2*  HCT 27.9* 27.5*  MCV 90.9 91.4  PLT 202 162    Anemia Panel: Recent Labs    06/25/22 0347  FERRITIN 130  TIBC 272  IRON 25*    Imaging: No results found.  Cardiac Studies:  ECG:  Orders placed or performed during the hospital encounter of 06/12/22   ED EKG   ED EKG   EKG     Telemetry:  NSR   Echo: EF 25-30%   Medications:    aspirin EC  81 mg Oral Daily    atorvastatin  40 mg Oral Daily   carvedilol  6.25 mg Oral BID WC   Chlorhexidine Gluconate Cloth  6 each Topical Q0600   citalopram  20 mg Oral QPM   clonazePAM  0.5 mg Oral Daily   darbepoetin (ARANESP) injection - NON-DIALYSIS  60 mcg Subcutaneous Q Fri-1800   feeding supplement  237 mL Oral BID BM   furosemide  40 mg Oral BID   hydrALAZINE  25 mg Oral Q8H   insulin aspart  0-15 Units Subcutaneous TID WC   isosorbide mononitrate  15 mg Oral Daily   multivitamin  1 tablet Oral QHS   pantoprazole  40 mg Oral Daily   polyethylene glycol  17 g Oral Daily   QUEtiapine  300 mg Oral QHS   sodium chloride flush  3 mL Intravenous Q12H   warfarin  3 mg Oral ONCE-1600   Warfarin - Pharmacist Dosing Inpatient   Does not apply q1600      sodium chloride     heparin 1,600 Units/hr (06/26/22 0204)    Assessment/Plan:   Ischemic DCM:  has been seen by Dr Kipp Brood  Cath at [facility] with CTO LAD and LCX disease had acute renal failure on dialysis Plan was to have cardiac MRI and see if anterior wall viable and consider CABG. Unfortunately he received Ferrlecit yesterday and thus cannot have an MRI for at least a week. Continue anticoagulation for LV thrombus INR Rx 2.2 today Volume better with dialysis Will notify MRI/CT navigator to see about rescheduling MRI for next Monday   Will arrange outpatient f/u with Dr Radford Pax and Judie Grieve 06/26/2022, 10:59 AM

## 2022-06-26 NOTE — Plan of Care (Signed)
  Problem: Clinical Measurements: Goal: Respiratory complications will improve Outcome: Progressing   Problem: Activity: Goal: Risk for activity intolerance will decrease Outcome: Progressing   Problem: Safety: Goal: Ability to remain free from injury will improve Outcome: Progressing   

## 2022-06-26 NOTE — Progress Notes (Signed)
ANTICOAGULATION CONSULT NOTE - Follow-up Note  Pharmacy Consult for Heparin, warfarin Indication: chest pain/ACS & LV thrombus  Allergies  Allergen Reactions   Dilaudid [Hydromorphone Hcl] Other (See Comments)    hallucinations   Mirapex [Pramipexole Dihydrochloride] Other (See Comments)    Leg pain   Prednisone Other (See Comments)    irritability    Patient Measurements: Height: '5\' 10"'$  (177.8 cm) Weight: 70 kg (154 lb 4.8 oz) IBW/kg (Calculated) : 73 Heparin Dosing Weight: 75.8kg  Vital Signs: Temp: 98.2 F (36.8 C) (09/24 0503) Temp Source: Oral (09/24 0503) BP: 149/90 (09/24 0808) Pulse Rate: 91 (09/24 0808)  Labs: Recent Labs    06/24/22 0411 06/25/22 0347 06/26/22 0700  HGB 9.2* 9.0* 9.2*  HCT 27.9* 27.9* 27.5*  PLT 184 202 162  LABPROT 14.6 21.7* 24.1*  INR 1.1 1.9* 2.2*  HEPARINUNFRC 0.24* 0.38 0.42  CREATININE 6.11* 7.65*  --      Estimated Creatinine Clearance: 8.6 mL/min (A) (by C-G formula based on SCr of 7.65 mg/dL (H)).   Medical History: Past Medical History:  Diagnosis Date   AAA (abdominal aortic aneurysm) (HCC)    Abdominal wall mass of left lower quadrant    Basal cell carcinoma    CAD (coronary artery disease) 2011   moderate   Colon polyp    DDD (degenerative disc disease)    Depression    Diabetes mellitus type II    no meds   GERD (gastroesophageal reflux disease)    Hyperlipidemia    Neuromuscular disorder (HCC)    Osteopenia    PAD (peripheral artery disease) (HCC)    Renal cell carcinoma    Restless leg syndrome    Sleep apnea    used a cpap yr ago-does not snore or use it now.    Medications:  Medications Prior to Admission  Medication Sig Dispense Refill Last Dose   amLODipine (NORVASC) 10 MG tablet Take 10 mg by mouth every evening.   06/11/2022   apixaban (ELIQUIS) 5 MG TABS tablet Take 5 mg by mouth 2 (two) times daily.   06/11/2022 at 2000   atorvastatin (LIPITOR) 40 MG tablet Take 40 mg by mouth at bedtime.    06/11/2022   citalopram (CELEXA) 40 MG tablet Take 40 mg by mouth every evening.   06/11/2022   clonazePAM (KLONOPIN) 0.5 MG tablet Take 0.5 mg by mouth at bedtime.   06/11/2022   gabapentin (NEURONTIN) 300 MG capsule Take 300 mg by mouth 3 (three) times daily.   06/11/2022   lansoprazole (PREVACID) 15 MG capsule Take 15 mg by mouth every morning.   06/11/2022   nicotine (NICODERM CQ - DOSED IN MG/24 HOURS) 21 mg/24hr patch Place 21 mg onto the skin daily.   06/12/2022   QUEtiapine (SEROQUEL) 300 MG tablet Take 300 mg by mouth at bedtime.   06/11/2022    Scheduled:   aspirin EC  81 mg Oral Daily   atorvastatin  40 mg Oral Daily   carvedilol  6.25 mg Oral BID WC   Chlorhexidine Gluconate Cloth  6 each Topical Q0600   Chlorhexidine Gluconate Cloth  6 each Topical Q0600   Chlorhexidine Gluconate Cloth  6 each Topical Q0600   citalopram  20 mg Oral QPM   clonazePAM  0.5 mg Oral Daily   darbepoetin (ARANESP) injection - NON-DIALYSIS  60 mcg Subcutaneous Q Fri-1800   feeding supplement  237 mL Oral BID BM   furosemide  40 mg Oral BID   hydrALAZINE  25 mg Oral Q8H   insulin aspart  0-15 Units Subcutaneous TID WC   isosorbide mononitrate  15 mg Oral Daily   multivitamin  1 tablet Oral QHS   pantoprazole  40 mg Oral Daily   polyethylene glycol  17 g Oral Daily   QUEtiapine  300 mg Oral QHS   sodium chloride flush  3 mL Intravenous Q12H   Warfarin - Pharmacist Dosing Inpatient   Does not apply q1600   Infusions:   sodium chloride     heparin 1,600 Units/hr (06/26/22 0204)   PRN: sodium chloride, acetaminophen, ondansetron (ZOFRAN) IV, sodium chloride flush  Assessment: 63 yom with a history of LV thrombus (05/2022) on apixaban prior to arrival (Last dose 9/9 at 2000). Patient noted to have 2V CAD and possible need for CABG--deferring CABG for now until renal function improved. Heparin per pharmacy consult placed for chest pain/ACS and LV thrombus .  Started warfarin 9/21. INR 1.9>>2.2 from  yesterday--therapeutic. Held dose 9/23 due to INR increase 1.1>>1.9 after '5mg'$  dose x2 days. Will give lower dose today given INR increase with '5mg'$  dose.  Heparin level today is therapeutic at 0.42, on 1600 units/hr. Hgb 9.2--stable. Plts 202>>162--stable, pharmacy monitoring. No line issues or signs/symptoms of bleeding reported.  Goal of Therapy:  INR 2-3 Heparin level 0.3-0.7 units/ml Monitor platelets by anticoagulation protocol: Yes   Plan:  -Continue heparin gtt @ 1600 units/hr -warfarin '3mg'$  tonight  -Monitor heparin level, CBC, INR, s/sx of bleeding daily    Billey Gosling, PharmD PGY1 Pharmacy Resident 9/24/20238:42 AM

## 2022-06-26 NOTE — Progress Notes (Addendum)
Progress Note   Patient: Allen Cortez CWC:[id number] DOB: Dec 18, 1950 DOA: 06/12/2022     14 DOS: the patient was seen and examined on 06/26/2022   Brief hospital course: Allen Cortez was admitted to the hospital with the working diagnosis of acute decompensation of heart failure. Progressive renal failure now on ERSD.   71 y.o. male with a history of CAD, AAA s/p repair, hypertension, hyperlipidemia, diabetes mellitus type 2, OSA, GERD, renal cell carcinoma s/p partial left nephrectomy, CKD stage IIIa, abdominal wall mass, depression, LV thrombus on Eliquis. Patient reported worsening dyspnea and lower extremity edema. On his initial physical examination his blood pressure was 164/104, HR 94, RR 35 and 02 saturation 95%, lungs with no wheezing or rales, positive JVD, heart with S1 and S2 present and rhythmic with no gallops, abdomen with no distention and no lower extremity edema.   Na 139, K 4,1 CL 101 bicarbonate 22, glucose 106 bun 56 cr 5,0  High sensitive troponin 232 and 219  Wbc  10,7 hgb 10,7 plt 255   Chest radiograph with bilateral interstitial infiltrates with cephalization of the vasculature.   EKG 90 bpm, left axis deviation, normal intervals, left anterior fascicular block, sinus rhythm with no significant ST segment or T wave changes.   Creatinine continues to worsen. Supportive care. During hospitalization, patient developed transient aphasia and dysarthria with associated headache in setting of heparin IV infusion; code stroke called which revealed evidence of no acute stroke.  09/18 patient had a temporal HD cathter placement and started on renal replacement therapy.   09/21 patient will need to continue with outpatient HD. He has been started on warfarin for anticoagulation. Outpatient follow up with CT surgery for eventual CABG.  09/22 Pending INR to be therapeutic and outpatient HD.  09/23 plan for patient to have cardiac MRI inpatient on Monday before discharge  home.   Assessment and Plan: * Acute kidney injury (AKI) with acute tubular necrosis (ATN) (HCC) Progressive renal failure, now probably ERSD. Patient has been placed on renal replacement therapy.   Plan to continue with outpatient renal replacement therapy.  Continue with furosemide po.   Acute on chronic respiratory failure due to acute pulmonary edema. Continue oxymetry monitoring and supplemental 02 per [place] Oxymetry today on room air is 96%  Anemia of chronic renal disease, continue with EPO and Iron supplementation. (may need to hold further doses, patient pending for cardiac MRI).   Acute on chronic systolic CHF (congestive heart failure) (HCC) Echocardiogram with reduced LV systolic function with EF 30 to 35%, akinesis of the left ventricular, apical septal wall, inferior wall, anterior wall and lateral wall, akinesis of the left ventricular, entire apical segment. Severe hypokinesis of the left ventricular basal mid anterior wall. RV systolic function preserved. RVSP 45.5   Patient will need to continue with renal replacement therapy for fluid management.  His volume has improved.    Continue carvedilol, isosorbide and hydralazine.   LV thrombus, start transition to warfarin, continue with heparin IV bridging.  INR today 2.2. possible dc heparin tomorrow, 5 days on warfarin and potentially therapeutic INR for 24 hrs.   Coronary atherosclerosis of native coronary artery Positive angina but no acute coronary syndrome (ruled out for NSTEMI).  Patient with severe tow vessel coronary artery disease, will need  CABG. Plan to continue medical therapy, and follow up as outpatient with CT surgery for CABG Currently patient is chest pain free.  Plan to get cardiac MRI before his discharge.  Type 2 diabetes mellitus with hyperlipidemia (HCC) Continue glucose cover and monitoring with insulin sliding scale.  Patient with poor appetite Fasting glucose 105 this am.  Continue with  nutritional supplements.   Continue with statin therapy.   GERD (gastroesophageal reflux disease) Continue with once daily pantoprazole.   Sleep apnea Patient reports he was unable to tolerate CPAP previously   Aphasia 09/13 code stroke  MRA and MRI with no acute changes CVA has been ruled out.  Continue close neuro checks.   Depression Plan to continue with citalopram, clonazepam, and quetiapine.   Malnutrition of moderate degree Continue with nutritional supplementation.         Subjective: Patient with no chest pain, dyspnea has improved. Out of bed and tolerating HD  Physical Exam: Vitals:   06/25/22 2111 06/26/22 0158 06/26/22 0503 06/26/22 0808  BP: (!) 133/90  139/84 (!) 149/90  Pulse:    91  Resp:   (!) 22 16  Temp:   98.2 F (36.8 C)   TempSrc:   Oral   SpO2:   95% 91%  Weight:  70 kg    Height:       Neurology awake and alert ENT with mild pallor Cardiovascular with S1 and S2 present and rhythmic with no gallops Respiratory with no rales or wheezing Abdomen with no distention  No lower extremity edema  Data Reviewed:    Family Communication: no family at the bedside   Disposition: Status is: Inpatient Remains inpatient appropriate because: pending brain MRI   Planned Discharge Destination: Home     Author: Tawni Millers, MD 06/26/2022 10:42 AM  For on call review [email or web address].

## 2022-06-26 NOTE — Progress Notes (Signed)
   06/26/22 1055  Mobility  Activity Ambulated independently in hallway  Level of Assistance Standby assist, set-up cues, supervision of patient - no hands on  Assistive Device Other (Comment) (IV Pole)  Distance Ambulated (ft) 20 ft  Activity Response Tolerated well  $Mobility charge 1 Mobility   Mobility Specialist Progress Note  Pt was in bed and agreeable. Mobility cut short d/t c/o leg pain. Returned to bed w/ all needs met and call bell in reach.   Lucious Groves Mobility Specialist

## 2022-06-26 NOTE — Progress Notes (Signed)
[facility] Progress Note   Subjective:   no acute events overnight-  1250  UOP -  had HD yest-  no UF by design-  no labs this AM.  Needs to stay in hospital to get cardiac MRI tomorrow  Objective Vitals:   06/25/22 2111 06/26/22 0158 06/26/22 0503 06/26/22 0808  BP: (!) 133/90  139/84 (!) 149/90  Pulse:    91  Resp:   (!) 22 16  Temp:   98.2 F (36.8 C)   TempSrc:   Oral   SpO2:   95% 91%  Weight:  70 kg    Height:       Physical Exam General: In bed in no distress-  chronically ill appearing Heart: Normal rate, no rub Lungs: is on side-  crackles on dep side Abdomen: soft, nondistended Extremities: no edema, warm and well perfused Neuro: Minimal tremor, conversant and oriented  Additional Objective Labs: Basic Metabolic Panel: Recent Labs  Lab 06/21/22 0338 06/22/22 0521 06/23/22 0651 06/24/22 0411 06/25/22 0347  NA 138 138 136 138 136  K 4.2 4.2 4.2 4.3 4.3  CL 98 99 99 97* 96*  CO2 '23 23 22 26 23  '$ GLUCOSE 102* 105* 107* 103* 99  BUN 68* 78* 91* 46* 65*  CREATININE 6.91* 8.14* 8.65* 6.11* 7.65*  CALCIUM 9.0 9.1 9.2 9.1 9.2  PHOS 5.5* 5.7*  --   --  5.5*   Liver Function Tests: Recent Labs  Lab 06/21/22 0338 06/22/22 0521 06/25/22 0347  ALBUMIN 2.8* 2.7* 2.8*   No results for input(s): "LIPASE", "AMYLASE" in the last 168 hours. CBC: Recent Labs  Lab 06/21/22 0338 06/22/22 0521 06/23/22 0651 06/24/22 0411 06/25/22 0347  WBC 9.4 9.7 11.4* 14.4* 14.3*  HGB 9.6* 9.5* 9.6* 9.2* 9.0*  HCT 28.3* 27.5* 28.1* 27.9* 27.9*  MCV 87.9 87.0 88.4 89.1 90.9  PLT 184 192 190 184 202   Blood Culture No results found for: "SDES", "SPECREQUEST", "CULT", "REPTSTATUS"  Cardiac Enzymes: No results for input(s): "CKTOTAL", "CKMB", "CKMBINDEX", "TROPONINI" in the last 168 hours.  CBG: Recent Labs  Lab 06/24/22 2100 06/25/22 0623 06/25/22 1224 06/25/22 2128 06/26/22 0600  GLUCAP 135* 105* 120* 127* 105*   Iron Studies:  Recent Labs     06/25/22 0347  IRON 25*  TIBC 272  FERRITIN 130   '@lablastinr3'$ @ Studies/Results: No results found. Medications:  sodium chloride     heparin 1,600 Units/hr (06/26/22 0204)    aspirin EC  81 mg Oral Daily   atorvastatin  40 mg Oral Daily   carvedilol  6.25 mg Oral BID WC   Chlorhexidine Gluconate Cloth  6 each Topical Q0600   Chlorhexidine Gluconate Cloth  6 each Topical Q0600   Chlorhexidine Gluconate Cloth  6 each Topical Q0600   citalopram  20 mg Oral QPM   clonazePAM  0.5 mg Oral Daily   darbepoetin (ARANESP) injection - NON-DIALYSIS  60 mcg Subcutaneous Q Fri-1800   feeding supplement  237 mL Oral BID BM   furosemide  40 mg Oral BID   hydrALAZINE  25 mg Oral Q8H   insulin aspart  0-15 Units Subcutaneous TID WC   isosorbide mononitrate  15 mg Oral Daily   multivitamin  1 tablet Oral QHS   pantoprazole  40 mg Oral Daily   polyethylene glycol  17 g Oral Daily   QUEtiapine  300 mg Oral QHS   sodium chloride flush  3 mL Intravenous Q12H   Warfarin - Pharmacist Dosing  Inpatient   Does not apply q1600   Assessment/ Plan: AKI on CKD 3b - b/l creatinine 1.6 prior to recent heart cath.   2 days post cath (creat 2.2) and from 9/5 in ED here (creat 3.3) are not entirely consistent w/ a typical post-contrast ATN / AKI pattern however cholesterol embolic injury is possible. Creat here 5.0  3 wks after heart cath and climbing. CES may not improve fully but CIN/ATN may.  C3  wnl.   UA +small blood (none on microscopy) and 30 protein; FeNa c/w ATN.  Renal US c/w CKD, simple cysts. He had mild uremic symptoms and also issues with oxygenation so decision made to support with HD.  S/p  [facility] placement and  first HD on 9/18-  second on 9/21, third 9/23- BUN and crt rising off of HD but is non oliguric-  have been able to arrange OP AKI HD at [facility] M[facility].  He is staying to get cardiac MRI tomorrow-  will give [place] an opportunity to watch one more day for renal improvement -  tentatively plan for HD  tomorrow but try to check labs first  Uncont HTN - getting amlodipine, hydralazine, coreg and imdur titrated by cardiology and BPs now fine.   Volume may be playing a role as well-  look to dec antihypertensives once volume is better.  Since made such good urine also added diuretic - lasix 40 BID PO  need to watch for hypotension -  put admin parameter on hydralazine  CAD - sp LHC on 8/22 at [facility] as above; Cardiology following - think he'll need CABG ultimately.  Pre op testing hindered by AKI for now.  CT surgery consulting. I would say if not recovered renal function in a month he is unlikely to recover and would proceed with CABG with the designation of ESRD-  for cardiac MRI tomorrow A/C systolic CHF - echo with EF 30-35%.  Meds limited by kidney function.    Acute hypoxic resp failure -overall stable, probably will need O2 for home  Anemia-  not too much of an issue but is trending down-   added esa -  iron stores low-  gave dose of iron 9/23  Bones-  have not started binder-  phos 5.5  or checked PTH   Louis Meckel

## 2022-06-27 ENCOUNTER — Other Ambulatory Visit (HOSPITAL_COMMUNITY): Payer: Self-pay

## 2022-06-27 DIAGNOSIS — I5031 Acute diastolic (congestive) heart failure: Secondary | ICD-10-CM | POA: Diagnosis not present

## 2022-06-27 DIAGNOSIS — I25118 Atherosclerotic heart disease of native coronary artery with other forms of angina pectoris: Secondary | ICD-10-CM | POA: Diagnosis not present

## 2022-06-27 DIAGNOSIS — I5023 Acute on chronic systolic (congestive) heart failure: Secondary | ICD-10-CM | POA: Diagnosis not present

## 2022-06-27 DIAGNOSIS — N170 Acute kidney failure with tubular necrosis: Secondary | ICD-10-CM | POA: Diagnosis not present

## 2022-06-27 LAB — RENAL FUNCTION PANEL
Albumin: 2.8 g/dL — ABNORMAL LOW (ref 3.5–5.0)
Anion gap: 12 (ref 5–15)
BUN: 50 mg/dL — ABNORMAL HIGH (ref 8–23)
CO2: 27 mmol/L (ref 22–32)
Calcium: 8.9 mg/dL (ref 8.9–10.3)
Chloride: 100 mmol/L (ref 98–111)
Creatinine, Ser: 6.8 mg/dL — ABNORMAL HIGH (ref 0.61–1.24)
GFR, Estimated: 8 mL/min — ABNORMAL LOW (ref >60)
Glucose, Bld: 95 mg/dL (ref 70–99)
Phosphorus: 5.8 mg/dL — ABNORMAL HIGH (ref 2.5–4.6)
Potassium: 4.1 mmol/L (ref 3.5–5.1)
Sodium: 139 mmol/L (ref 135–145)

## 2022-06-27 LAB — GLUCOSE, CAPILLARY
Glucose-Capillary: 95 mg/dL (ref 70–99)
Glucose-Capillary: 121 mg/dL — ABNORMAL HIGH (ref 70–99)

## 2022-06-27 LAB — CBC
HCT: 28.1 % — ABNORMAL LOW (ref 39.0–52.0)
Hemoglobin: 9.4 g/dL — ABNORMAL LOW (ref 13.0–17.0)
MCH: 30.5 pg (ref 26.0–34.0)
MCHC: 33.5 g/dL (ref 30.0–36.0)
MCV: 91.2 fL (ref 80.0–100.0)
Platelets: 185 10*3/uL (ref 150–400)
RBC: 3.08 MIL/uL — ABNORMAL LOW (ref 4.22–5.81)
RDW: 15.6 % — ABNORMAL HIGH (ref 11.5–15.5)
WBC: 10.5 10*3/uL (ref 4.0–10.5)
nRBC: 0 % (ref 0.0–0.2)

## 2022-06-27 LAB — HEPARIN LEVEL (UNFRACTIONATED): Heparin Unfractionated: 0.48 IU/mL (ref 0.30–0.70)

## 2022-06-27 LAB — PROTIME-INR
INR: 2.1 — ABNORMAL HIGH (ref 0.8–1.2)
Prothrombin Time: 23.4 seconds — ABNORMAL HIGH (ref 11.4–15.2)

## 2022-06-27 MED ORDER — CARVEDILOL 6.25 MG PO TABS
6.2500 mg | ORAL_TABLET | Freq: Two times a day (BID) | ORAL | 0 refills | Status: DC
  Filled 2022-06-27: qty 60, 30d supply, fill #0

## 2022-06-27 MED ORDER — ENSURE ENLIVE PO LIQD
237.0000 mL | Freq: Two times a day (BID) | ORAL | 0 refills | Status: DC
  Filled 2022-06-27 – 2022-06-28 (×2): qty 14220, 30d supply, fill #0

## 2022-06-27 MED ORDER — WARFARIN SODIUM 5 MG PO TABS: 5.0000 mg | ORAL_TABLET | Freq: Every day | ORAL | Status: DC

## 2022-06-27 MED ORDER — RENA-VITE PO TABS
1.0000 | ORAL_TABLET | Freq: Every day | ORAL | 0 refills | Status: DC
  Filled 2022-06-27: qty 30, 30d supply, fill #0

## 2022-06-27 MED ORDER — HYDRALAZINE HCL 25 MG PO TABS
25.0000 mg | ORAL_TABLET | Freq: Three times a day (TID) | ORAL | 0 refills | Status: DC
  Filled 2022-06-27: qty 90, 30d supply, fill #0

## 2022-06-27 MED ORDER — ASPIRIN 81 MG PO TBEC
81.0000 mg | DELAYED_RELEASE_TABLET | Freq: Every day | ORAL | 0 refills | Status: DC
  Filled 2022-06-27: qty 30, 30d supply, fill #0

## 2022-06-27 MED ORDER — ISOSORBIDE MONONITRATE ER 30 MG PO TB24
15.0000 mg | ORAL_TABLET | Freq: Every day | ORAL | 0 refills | Status: DC
  Filled 2022-06-27: qty 15, 30d supply, fill #0

## 2022-06-27 MED ORDER — ACETAMINOPHEN 325 MG PO TABS: 650.0000 mg | ORAL_TABLET | ORAL | Status: AC | PRN

## 2022-06-27 MED ORDER — FUROSEMIDE 40 MG PO TABS
40.0000 mg | ORAL_TABLET | Freq: Two times a day (BID) | ORAL | 0 refills | Status: DC
  Filled 2022-06-27: qty 60, 30d supply, fill #0

## 2022-06-27 MED ORDER — WARFARIN SODIUM 5 MG PO TABS
ORAL_TABLET | ORAL | 0 refills | Status: DC
  Filled 2022-06-27: qty 30, 30d supply, fill #0

## 2022-06-27 NOTE — Progress Notes (Signed)
[facility] Progress Note   Subjective:    no acute events overnight Outpt HD is THS at [facility] UOP only 0.5L in past 24h K 4.1, HCO 27, SCr 5.6-->6.8 No cardiac MRI rec rec IV Fe  Objective Vitals:   06/26/22 1725 06/26/22 2107 06/27/22 0345 06/27/22 0809  BP: (!) 140/95 (!) 152/94 138/82 (!) 157/92  Pulse: 90 81 81   Resp:  18 18   Temp:  98.3 F (36.8 C) 98 F (36.7 C)   TempSrc:  Oral    SpO2:  (!) 84% 93%   Weight:   70.4 kg   Height:       Physical Exam General: In bed in no distress-  chronically ill appearing Heart: Normal rate, no rub Lungs: CTAB Abdomen: soft, nondistended Extremities: no edema, warm and well perfused Neuro: Minimal tremor, conversant and oriented  Additional Objective Labs: Basic Metabolic Panel: Recent Labs  Lab 06/25/22 0347 06/26/22 1035 06/27/22 0611  NA 136 136 139  K 4.3 4.3 4.1  CL 96* 97* 100  CO2 '23 26 27  '$ GLUCOSE 99 115* 95  BUN 65* 38* 50*  CREATININE 7.65* 5.60* 6.80*  CALCIUM 9.2 8.8* 8.9  PHOS 5.5* 5.1* 5.8*    Liver Function Tests: Recent Labs  Lab 06/25/22 0347 06/26/22 1035 06/27/22 0611  ALBUMIN 2.8* 2.7* 2.8*    No results for input(s): "LIPASE", "AMYLASE" in the last 168 hours. CBC: Recent Labs  Lab 06/23/22 0651 06/24/22 0411 06/25/22 0347 06/26/22 0700 06/27/22 0611  WBC 11.4* 14.4* 14.3* 12.1* 10.5  HGB 9.6* 9.2* 9.0* 9.2* 9.4*  HCT 28.1* 27.9* 27.9* 27.5* 28.1*  MCV 88.4 89.1 90.9 91.4 91.2  PLT 190 184 202 162 185    Blood Culture No results found for: "SDES", "SPECREQUEST", "CULT", "REPTSTATUS"  Cardiac Enzymes: No results for input(s): "CKTOTAL", "CKMB", "CKMBINDEX", "TROPONINI" in the last 168 hours.  CBG: Recent Labs  Lab 06/26/22 0600 06/26/22 1136 06/26/22 1541 06/26/22 2108 06/27/22 0607  GLUCAP 105* 113* 133* 99 95    Iron Studies:  Recent Labs    06/25/22 0347  IRON 25*  TIBC 272  FERRITIN 130    '@lablastinr3'$ @ Studies/Results: No results  found. Medications:  sodium chloride      aspirin EC  81 mg Oral Daily   atorvastatin  40 mg Oral Daily   carvedilol  6.25 mg Oral BID WC   Chlorhexidine Gluconate Cloth  6 each Topical Q0600   citalopram  20 mg Oral QPM   clonazePAM  0.5 mg Oral Daily   darbepoetin (ARANESP) injection - NON-DIALYSIS  60 mcg Subcutaneous Q Fri-1800   feeding supplement  237 mL Oral BID BM   furosemide  40 mg Oral BID   hydrALAZINE  25 mg Oral Q8H   insulin aspart  0-15 Units Subcutaneous TID WC   isosorbide mononitrate  15 mg Oral Daily   multivitamin  1 tablet Oral QHS   pantoprazole  40 mg Oral Daily   polyethylene glycol  17 g Oral Daily   QUEtiapine  300 mg Oral QHS   sodium chloride flush  3 mL Intravenous Q12H   warfarin  5 mg Oral q1600   Warfarin - Pharmacist Dosing Inpatient   Does not apply [id number]   Assessment/ Plan: AKI on CKD 3b - b/l creatinine 1.6 prior to recent heart cath.   2 days post cath (creat 2.2) and from 9/5 in ED here (creat 3.3) are not entirely consistent w/ a typical  post-contrast ATN / AKI pattern however cholesterol embolic injury is possible. Creat here 5.0  3 wks after heart cath and climbing. CES may not improve fully but CIN/ATN may.  C3  wnl.   UA +small blood (none on microscopy) and 30 protein; FeNa c/w ATN.  Renal US c/w CKD, simple cysts. He had mild uremic symptoms and also issues with oxygenation so decision made to support with HD.  S/p  TDC placement and  first HD on 9/18-  second on 9/21, third 9/23- BUN and crt rising off of HD but is non oliguric-  have been able to arrange OP AKI HD at [facility].  For DC today. He is aware to go to outpt HD tomorrow  Uncont HTN - getting amlodipine, hydralazine, coreg and imdur titrated by cardiology and BPs now fine.   Volume may be playing a role as well-  look to dec antihypertensives once volume is better.  Since made such good urine also added diuretic - lasix 40 BID PO  need to watch for hypotension -  put admin  parameter on hydralazine  CAD - sp LHC on 8/22 at [facility] as above; Cardiology following - think he'll need CABG ultimately.  Pre op testing hindered by AKI for now.  CT surgery consulting. I would say if not recovered renal function in a month he is unlikely to recover and would proceed with CABG with the designation of ESRD-  for cardiac MRI tomorrow A/C systolic CHF - echo with EF 30-35%.  Meds limited by kidney function.    Acute hypoxic resp failure -overall stable, probably will need O2 for home  Anemia-  not too much of an issue but is trending down-   added esa -  iron stores low-  gave dose of iron 9/23  Bones-  have not started binder-  phos 5.5  or checked PTH   Rexene Agent

## 2022-06-27 NOTE — Progress Notes (Signed)
Pt to d/c to home today. Spoke to pt's wife, [name], via phone to confirm pt's out-pt HD schedule and pt to start tomorrow. Pt's wife voices understanding and agreeable to plan. Contacted [place] this am and spoke to [place], [place] manager. Clinic advised pt will d/c today and start tomorrow as planned. Contacted renal NP to request that orders be sent to clinic for tomorrow's treatment. Pt's out-pt HD arrangements have been placed on pt's AVS as well.   Melven Sartorius Renal Navigator [phone]

## 2022-06-27 NOTE — Progress Notes (Signed)
Order to discharge pt home.  Discharge instructions/AVS given to patient and reviewed - education provided as needed.  Pt advised to call PCP and/or come back to the hospital if there are any problems. Pt verbalized understanding.     Pts walker delivered to room.  Pt waiting on oxygen delivery prior to discharge.

## 2022-06-27 NOTE — Evaluation (Signed)
Physical Therapy Evaluation Patient Details Name: Allen Cortez MRN: [id number] DOB: 1951-03-06 Today's Date: 06/27/2022  History of Present Illness  pt is 71 yo male who preented to ED on 06/12/22 with SOB, was in acute decompensation of heart failure, followed by ESRD, now on HD. PMH: CHF, CAD, AAA repair, HTN, HLD, DM2, OSA, GERD, renal cell carcinoma with partial L nephrectomy, CKD3, abdominal wall mass, depression.  Clinical Impression  Patient evaluated by Physical Therapy with no further acute PT needs identified. All education has been completed and the patient has no further questions. Pt received up in room, independent with RW. Pt with generalized weakness LE's in part due to pain he is experiencing from thighs down through knees and into calves. Activity exacerbates, rest alleviates. Discussed slow progression of activity at home and rec HHPT to help with this. Pt's LE pain also somewhat better with O2. SPO2 dropped to 87% on RA with ambulation and pt tolerated only 30'. Remained in 90's on 3L O2 with increased distance of 46'.  See below for any follow-up Physical Therapy or equipment needs. PT is signing off. Thank you for this referral.        Recommendations for follow up therapy are one component of a multi-disciplinary discharge planning process, led by the attending physician.  Recommendations may be updated based on patient status, additional functional criteria and insurance authorization.  Follow Up Recommendations Home health PT      Assistance Recommended at Discharge PRN  Patient can return home with the following  Help with stairs or ramp for entrance;Assistance with cooking/housework    Equipment Recommendations Rolling walker (2 wheels)  Recommendations for Other Services       Functional Status Assessment Patient has had a recent decline in their functional status and demonstrates the ability to make significant improvements in function in a reasonable and  predictable amount of time.     Precautions / Restrictions Precautions Precautions: Other (comment) Precaution Comments: O2 sats Restrictions Weight Bearing Restrictions: No      Mobility  Bed Mobility Overal bed mobility: Independent                  Transfers Overall transfer level: Independent Equipment used: Rolling walker (2 wheels), None               General transfer comment: pt safe with transfers, mildly increased time due to discomfort in thighs. Education given on proper hand placement with RW    Ambulation/Gait Ambulation/Gait assistance: Modified independent (Device/Increase time) Gait Distance (Feet): 75 Feet (30', 45') Assistive device: Rolling walker (2 wheels) Gait Pattern/deviations: Step-through pattern Gait velocity: decreased Gait velocity interpretation: 1.31 - 2.62 ft/sec, indicative of limited community ambulator   General Gait Details: first 72' on RA with SPO2 dropping to 87% and pt limited by LE pain. Second bout on 3L O2 with SPO2 remaining in low 90's and tolerated increased distance before pain began  Stairs            Wheelchair Mobility    Modified Rankin (Stroke Patients Only)       Balance Overall balance assessment: No apparent balance deficits (not formally assessed)                                           Pertinent Vitals/Pain Pain Assessment Pain Assessment: 0-10 Pain Score: 5  Pain Location: B  thighs, knees, calves Pain Descriptors / Indicators: Aching, Sore Pain Intervention(s): Limited activity within patient's tolerance, Monitored during session    Home Living Family/patient expects to be discharged to:: Private residence Living Arrangements: Spouse/significant other Available Help at Discharge: Family;Available 24 hours/day Type of Home: House Home Access: Stairs to enter Entrance Stairs-Rails: Right;Left;Can reach both Entrance Stairs-Number of Steps: 3   Home Layout: One  level Home Equipment: Shower seat - built in Additional Comments: lives with wife, she is independent    Prior Function Prior Level of Function : Independent/Modified Independent;Driving                     Hand Dominance   Dominant Hand: Right    Extremity/Trunk Assessment   Upper Extremity Assessment Upper Extremity Assessment: Overall WFL for tasks assessed    Lower Extremity Assessment Lower Extremity Assessment: Generalized weakness    Cervical / Trunk Assessment Cervical / Trunk Assessment: Normal  Communication   Communication: No difficulties  Cognition Arousal/Alertness: Awake/alert Behavior During Therapy: WFL for tasks assessed/performed Overall Cognitive Status: Within Functional Limits for tasks assessed                                          General Comments General comments (skin integrity, edema, etc.): education given on activity progression at home home and self monitoring with pulse ox. He is glad to go home but lamenting need for O2 and HD    Exercises     Assessment/Plan    PT Assessment All further PT needs can be met in the next venue of care  PT Problem List Decreased strength;Decreased activity tolerance;Decreased mobility;Decreased knowledge of use of DME;Decreased knowledge of precautions;Pain;Cardiopulmonary status limiting activity       PT Treatment Interventions      PT Goals (Current goals can be found in the Care Plan section)  Acute Rehab PT Goals Patient Stated Goal: return home PT Goal Formulation: All assessment and education complete, DC therapy    Frequency       Co-evaluation               AM-PAC PT "6 Clicks" Mobility  Outcome Measure Help needed turning from your back to your side while in a flat bed without using bedrails?: None Help needed moving from lying on your back to sitting on the side of a flat bed without using bedrails?: None Help needed moving to and from a bed to a  chair (including a wheelchair)?: None Help needed standing up from a chair using your arms (e.g., wheelchair or bedside chair)?: None Help needed to walk in hospital room?: A Little Help needed climbing 3-5 steps with a railing? : A Little 6 Click Score: 22    End of Session Equipment Utilized During Treatment: Oxygen Activity Tolerance: Patient tolerated treatment well;Patient limited by pain Patient left: in bed;with call bell/phone within reach Nurse Communication: Mobility status PT Visit Diagnosis: Pain;Difficulty in walking, not elsewhere classified (R26.2) Pain - part of body: Leg    Time: 8250-5397 PT Time Calculation (min) (ACUTE ONLY): 22 min   Charges:   PT Evaluation $PT Eval Moderate Complexity: [place], PT  Acute Rehab Services Secure chat preferred Office [place] 06/27/2022, 12:28 PM

## 2022-06-27 NOTE — TOC Transition Note (Signed)
Transition of Care [facility]) - CM/SW Discharge Note   Patient Details  Name: Allen Cortez MRN: [id number] Date of Birth: 1951-02-11  Transition of Care [facility]) CM/SW Contact:  Zenon Mayo, RN Phone Number: 06/27/2022, 1:07 PM   Clinical Narrative:    Patient is needing home oxygen per physical therapy, Patient is ok with [employer] supplying this.  MD ordered oxygen , NCM made referral thru parachute for home oxygen 3 liters.     Final next level of care: [place] Barriers to Discharge: Continued Medical Work up   Patient Goals and CMS Choice Patient states their goals for this hospitalization and ongoing recovery are:: return home with wife CMS Medicare.gov Compare Post Acute Care list provided to:: Patient Represenative (must comment) Choice offered to / list presented to : Spouse  Discharge Placement                       Discharge Plan and Services   Discharge Planning Services: CM Consult Post Acute Care Choice: Home Health, Durable Medical Equipment          DME Arranged: Walker rolling, Hospital bed, Lightweight manual wheelchair with seat cushion, Oxygen DME Agency: [employer] Date DME Agency Contacted: 06/24/22 Time DME Agency Contacted: [phone] Representative spoke with at DME Agency: Thedore Mins HH Arranged: PT, Nurse's Aide [place] Agency: [place] Date [place]: 06/24/22 Time [place]: 6160 Representative spoke with at [place]: [place] ([place]) Interventions     Readmission Risk Interventions    06/24/2022    3:23 PM  Readmission Risk Prevention Plan  Transportation Screening Complete  PCP or Specialist Appt within 3-5 Days Complete  HRI or [place] Complete  Social Work Consult for [place] Planning/Counseling Complete  Palliative Care Screening Not Applicable  Medication Review Press photographer) Complete

## 2022-06-27 NOTE — TOC Progression Note (Addendum)
Transition of Care [facility]) - Progression Note    Patient Details  Name: Allen Cortez MRN: [id number] Date of Birth: 1951/04/06  Transition of Care [facility]) CM/SW Contact  Zenon Mayo, RN Phone Number: 06/27/2022, 10:22 AM  Clinical Narrative:    Patient is for dc today, he is set up  with [facility] for [facility], physical therapy will check to see if he needs home oxygen, [employer] to supply a hospital bed and, Alanson Puls also, which will be delivered to the home.  Gilford Rile has been delivered to patient's room.  NCM notified Tommi Rumps with [employer] of dc today.   Expected Discharge Plan: [place] Barriers to Discharge: Continued Medical Work up  Expected Discharge Plan and Services Expected Discharge Plan: [place]   Discharge Planning Services: CM Consult Post Acute Care Choice: Home Health, Durable Medical Equipment Living arrangements for the past 2 months: Single Family Home Expected Discharge Date: 06/27/22               DME Arranged: Gilford Rile rolling, Hospital bed, Lightweight manual wheelchair with seat cushion DME Agency: [employer] Date DME Agency Contacted: 06/24/22 Time DME Agency Contacted: [phone] Representative spoke with at DME Agency: Thedore Mins HH Arranged: PT, Nurse's Aide [place] Agency: [place] Date [place]: 06/24/22 Time [place]: [place] Representative spoke with at [place]: [place] ([place]) Interventions    Readmission Risk Interventions    06/24/2022    3:23 PM  Readmission Risk Prevention Plan  Transportation Screening Complete  PCP or Specialist Appt within 3-5 Days Complete  HRI or [place] Complete  Social Work Consult for [place] Planning/Counseling Complete  Palliative Care Screening Not Applicable  Medication Review Press photographer) Complete

## 2022-06-27 NOTE — Progress Notes (Signed)
TOC medications delivered to pt.

## 2022-06-27 NOTE — Progress Notes (Signed)
Additional PT Note:  SATURATION QUALIFICATIONS: (This note is used to comply with regulatory documentation for home oxygen)  Patient Saturations on Room Air at Rest = 93%  Patient Saturations on Room Air while Ambulating = 87%  Patient Saturations on 3 Liters of oxygen while Ambulating = 93%  Please briefly explain why patient needs home oxygen:Pt experiences O2 desaturation when ambulating on RA and increased LE pain. With 3 LO2 SPO2 remains in 90's and pt with less LE pain.   Leighton Roach, PT  Acute Rehab Services Secure chat preferred Office [phone]

## 2022-06-27 NOTE — Progress Notes (Signed)
Mobility Specialist Progress Note:   06/27/22 0952  Mobility  Activity Ambulated with assistance in hallway  Level of Assistance Standby assist, set-up cues, supervision of patient - no hands on  Assistive Device Front wheel walker  Distance Ambulated (ft) 60 ft  Activity Response Tolerated well  $Mobility charge 1 Mobility   Pt received in bed willing to participate in mobility. Complaints of leg cramps. Left EOB with call bell in reach and all needs met.   [facility] Surveyor, mining Chat only

## 2022-06-27 NOTE — Plan of Care (Signed)
  Problem: Education: Goal: Ability to describe self-care measures that may prevent or decrease complications (Diabetes Survival Skills Education) will improve Outcome: Adequate for Discharge Goal: Individualized Educational Video(s) Outcome: Adequate for Discharge   Problem: Coping: Goal: Ability to adjust to condition or change in health will improve Outcome: Adequate for Discharge   Problem: Fluid Volume: Goal: Ability to maintain a balanced intake and output will improve Outcome: Adequate for Discharge   Problem: Health Behavior/Discharge Planning: Goal: Ability to identify and utilize available resources and services will improve Outcome: Adequate for Discharge Goal: Ability to manage health-related needs will improve Outcome: Adequate for Discharge   Problem: Metabolic: Goal: Ability to maintain appropriate glucose levels will improve Outcome: Adequate for Discharge   Problem: Nutritional: Goal: Maintenance of adequate nutrition will improve Outcome: Adequate for Discharge Goal: Progress toward achieving an optimal weight will improve Outcome: Adequate for Discharge   Problem: Skin Integrity: Goal: Risk for impaired skin integrity will decrease Outcome: Adequate for Discharge   Problem: Tissue Perfusion: Goal: Adequacy of tissue perfusion will improve Outcome: Adequate for Discharge   Problem: Education: Goal: Knowledge of General Education information will improve Description: Including pain rating scale, medication(s)/side effects and non-pharmacologic comfort measures Outcome: Adequate for Discharge   Problem: Health Behavior/Discharge Planning: Goal: Ability to manage health-related needs will improve Outcome: Adequate for Discharge   Problem: Clinical Measurements: Goal: Ability to maintain clinical measurements within normal limits will improve Outcome: Adequate for Discharge Goal: Will remain free from infection Outcome: Adequate for Discharge Goal:  Diagnostic test results will improve Outcome: Adequate for Discharge Goal: Respiratory complications will improve Outcome: Adequate for Discharge Goal: Cardiovascular complication will be avoided Outcome: Adequate for Discharge   Problem: Activity: Goal: Risk for activity intolerance will decrease Outcome: Adequate for Discharge   Problem: Nutrition: Goal: Adequate nutrition will be maintained Outcome: Adequate for Discharge   Problem: Coping: Goal: Level of anxiety will decrease Outcome: Adequate for Discharge   Problem: Elimination: Goal: Will not experience complications related to bowel motility Outcome: Adequate for Discharge Goal: Will not experience complications related to urinary retention Outcome: Adequate for Discharge   Problem: Pain Managment: Goal: General experience of comfort will improve Outcome: Adequate for Discharge   Problem: Safety: Goal: Ability to remain free from injury will improve Outcome: Adequate for Discharge   Problem: Skin Integrity: Goal: Risk for impaired skin integrity will decrease Outcome: Adequate for Discharge   Problem: Education: Goal: Knowledge of disease or condition will improve Outcome: Adequate for Discharge Goal: Knowledge of the prescribed therapeutic regimen will improve Outcome: Adequate for Discharge Goal: Individualized Educational Video(s) Outcome: Adequate for Discharge   Problem: Activity: Goal: Ability to tolerate increased activity will improve Outcome: Adequate for Discharge Goal: Will verbalize the importance of balancing activity with adequate rest periods Outcome: Adequate for Discharge   Problem: Respiratory: Goal: Ability to maintain a clear airway will improve Outcome: Adequate for Discharge Goal: Levels of oxygenation will improve Outcome: Adequate for Discharge Goal: Ability to maintain adequate ventilation will improve Outcome: Adequate for Discharge

## 2022-06-27 NOTE — Discharge Summary (Addendum)
Physician Discharge Summary   Patient: Allen Cortez MRN: [id number] DOB: 11-Aug-1951  Admit date:     06/12/2022  Discharge date: 06/27/22  Discharge Physician: Jimmy Picket Arantza Darrington   PCP: Gerome Sam, MD   Recommendations at discharge:    Patient will continue outpatient hemodialysis. Tuesday, Thursday and Saturday.   Continue warfarin with target INR 2 to 3, at the time of his discharge is 2.1  Outpatient cardiac MRI Follow up with Dr Collins Scotland in 7 to 10 days Follow up with warfarin clinica and cardiology as outpatient.  Follow up INR next week   I spoke with patient's wife at the bedside, we talked in detail about patient's condition, plan of care and prognosis and all questions were addressed.   Discharge Diagnoses: Principal Problem:   Acute kidney injury (AKI) with acute tubular necrosis (ATN) (HCC) Active Problems:   Acute on chronic systolic CHF (congestive heart failure) (HCC)   Coronary atherosclerosis of native coronary artery   Type 2 diabetes mellitus with hyperlipidemia (HCC)   GERD (gastroesophageal reflux disease)   Sleep apnea   Aphasia   Depression   Malnutrition of moderate degree  Resolved Problems:   Acute diastolic heart failure [facility]  Hospital Course: JAMARIS THEARD was admitted to the hospital with the working diagnosis of acute decompensation of heart failure. Progressive renal failure now on ERSD.   71 y.o. male with a history of CAD, AAA s/p repair, hypertension, hyperlipidemia, diabetes mellitus type 2, OSA, GERD, renal cell carcinoma s/p partial left nephrectomy, CKD stage IIIa, abdominal wall mass, depression, LV thrombus on Eliquis. Patient reported worsening dyspnea and lower extremity edema. On his initial physical examination his blood pressure was 164/104, HR 94, RR 35 and 02 saturation 95%, lungs with no wheezing or rales, positive JVD, heart with S1 and S2 present and rhythmic with no gallops, abdomen with no  distention and no lower extremity edema.   Na 139, K 4,1 CL 101 bicarbonate 22, glucose 106 bun 56 cr 5,0  High sensitive troponin 232 and 219  Wbc  10,7 hgb 10,7 plt 255   Chest radiograph with bilateral interstitial infiltrates with cephalization of the vasculature.   EKG 90 bpm, left axis deviation, normal intervals, left anterior fascicular block, sinus rhythm with no significant ST segment or T wave changes.   Patient was placed on furosemide for diuresis.   Creatinine continued to worsen. Supportive care. During hospitalization, patient developed transient aphasia and dysarthria with associated headache in setting of heparin IV infusion; code stroke called which revealed evidence of no acute stroke.  09/18 patient had a temporal HD cathter placement and started on renal replacement therapy.   09/21 patient will need to continue with outpatient HD. He has been started on warfarin for anticoagulation. Outpatient follow up with CT surgery for eventual CABG.  09/22 Pending INR to be therapeutic and outpatient HD.  09/23 plan for patient to have cardiac MRI as outpatient (patient had IV iron during his hospitalization).   Assessment and Plan: * Acute kidney injury (AKI) with acute tubular necrosis (ATN) (HCC) Progressive renal failure, now probably ERSD. Patient has been placed on renal replacement therapy.   Plan to continue with outpatient renal replacement therapy, scheduled Monday, Wednesday and Friday.  Continue with furosemide po.   Acute on chronic respiratory failure due to acute pulmonary edema. Continue oxymetry monitoring and supplemental 02 per [place] Oxymetry 91% on room air. Will get ambulatory oxymetry on room air before his discharge.  Anemia of chronic renal disease, continue with EPO and Iron supplementation (had IV iron).    Acute on chronic systolic CHF (congestive heart failure) (HCC) Echocardiogram with reduced LV systolic function with EF 30 to 35%, akinesis  of the left ventricular, apical septal wall, inferior wall, anterior wall and lateral wall, akinesis of the left ventricular, entire apical segment. Severe hypokinesis of the left ventricular basal mid anterior wall. RV systolic function preserved. RVSP 45.5   Patient will need to continue with renal replacement therapy for fluid management.  His volume has improved.    Continue carvedilol, isosorbide and hydralazine.   LV thrombus, patient was placed on warfarin with heparin bridging, at the time of his discharge his INR has been therapeutic for last 24 hrs and was been on warfarin for 5 days.  Plan to continue outpatient warfarin and keep INR target 2 to 3.   Coronary atherosclerosis of native coronary artery Positive angina but no acute coronary syndrome (ruled out for NSTEMI).  Patient with severe tow vessel coronary artery disease, will need  CABG. Plan to continue medical therapy, and follow up as outpatient with CT surgery for CABG Currently patient is chest pain free.  Plan to get cardiac MRI as outpatient.   Type 2 diabetes mellitus with hyperlipidemia (HCC) Continue glucose cover and monitoring with insulin sliding scale.  His glucose has remained stable during his hospitalization, he had poor oral intake.  Continue with nutritional supplements.   Continue with statin therapy.   GERD (gastroesophageal reflux disease) Continue with once daily pantoprazole.   Sleep apnea Patient reports he was unable to tolerate CPAP previously   Aphasia 09/13 code stroke  MRA and MRI with no acute changes CVA has been ruled out.  Continue close neuro checks.   Depression Plan to continue with citalopram, clonazepam, and quetiapine.   Malnutrition of moderate degree Continue with nutritional supplementation.          Consultants: cardiology, nephrology, neurology  Procedures performed: right IJ tunneled catheter.   Disposition: Home Diet recommendation:  Cardiac and Carb  modified diet DISCHARGE MEDICATION: Allergies as of 06/27/2022       Reactions   Dilaudid [hydromorphone Hcl] Other (See Comments)   hallucinations   Mirapex [pramipexole Dihydrochloride] Other (See Comments)   Leg pain   Prednisone Other (See Comments)   irritability        Medication List     STOP taking these medications    amLODipine 10 MG tablet Commonly known as: NORVASC   apixaban 5 MG Tabs tablet Commonly known as: ELIQUIS       TAKE these medications    acetaminophen 325 MG tablet Commonly known as: TYLENOL Take 2 tablets (650 mg total) by mouth every 4 (four) hours as needed for headache or mild pain.   Aspirin Low Dose 81 MG tablet Generic drug: aspirin EC Take 1 tablet (81 mg total) by mouth daily. Swallow whole. Start taking on: June 28, 2022   atorvastatin 40 MG tablet Commonly known as: LIPITOR Take 40 mg by mouth at bedtime.   carvedilol 6.25 MG tablet Commonly known as: COREG Take 1 tablet (6.25 mg total) by mouth 2 (two) times daily with a meal.   citalopram 40 MG tablet Commonly known as: CELEXA Take 40 mg by mouth every evening.   clonazePAM 0.5 MG tablet Commonly known as: KLONOPIN Take 0.5 mg by mouth at bedtime.   feeding supplement Liqd Take 237 mLs by mouth 2 (two) times daily  between meals.   furosemide 40 MG tablet Commonly known as: LASIX Take 1 tablet (40 mg total) by mouth 2 (two) times daily.   gabapentin 300 MG capsule Commonly known as: NEURONTIN Take 300 mg by mouth 3 (three) times daily.   hydrALAZINE 25 MG tablet Commonly known as: APRESOLINE Take 1 tablet (25 mg total) by mouth every 8 (eight) hours.   isosorbide mononitrate 30 MG 24 hr tablet Commonly known as: IMDUR Take 0.5 tablets (15 mg total) by mouth daily. Start taking on: June 28, 2022   lansoprazole 15 MG capsule Commonly known as: PREVACID Take 15 mg by mouth every morning.   multivitamin Tabs tablet Take 1 tablet by mouth at  bedtime.   nicotine 21 mg/24hr patch Commonly known as: NICODERM CQ - dosed in mg/24 hours Place 21 mg onto the skin daily.   QUEtiapine 300 MG tablet Commonly known as: SEROQUEL Take 300 mg by mouth at bedtime.   warfarin 5 MG tablet Commonly known as: COUMADIN Take 1 tablet daily Monday, Wednesday, Friday, Saturday and 'Sunday and take half tablet daily on Tuesday and Thursday.               Durable Medical Equipment  (From admission, onward)           Start     Ordered   06/24/22 1429  For home use only DME Hospital bed  Once       Question Answer Comment  Length of Need Lifetime   Patient has (list medical condition): CHF   The above medical condition requires: Patient requires the ability to reposition frequently   Head must be elevated greater than: 30 degrees   Bed type Semi-electric   Support Surface: Gel Overlay      06/24/22 1429   06/24/22 1427  For home use only DME lightweight manual wheelchair with seat cushion  Once       Comments: Patient suffers from weakness which impairs their ability to perform daily activities like bathing, dressing, grooming, and toileting in the home.  A cane will not resolve  issue with performing activities of daily living. A wheelchair will allow patient to safely perform daily activities. Patient is not able to propel themselves in the home using a standard weight wheelchair due to general weakness. Patient can self propel in the lightweight wheelchair. Length of need Lifetime. Accessories: elevating leg rests (ELRs), wheel locks, extensions and anti-tippers.   06/24/22 1428   06/24/22 1426  For home use only DME Walker rolling  Once       Question Answer Comment  Walker: With 5 Inch Wheels   Patient needs a walker to treat with the following condition Weakness      09'$ /22/23 [place], [facility] Kid[facility]. Go on 06/28/2022.   Why: Schedule is Tuesday/Thursday/Saturday with  5:55 am chair time.  Please arrive at 5:35 am. Contact information: [place] [address] [place], [facility] Follow up.   Specialty: Home Health Services Contact information: [place] [address] [phone]                Discharge Exam: Filed Weights   06/25/22 1719 06/26/22 0158 06/27/22 0345  Weight: 71.5 kg 70 kg 70.4 kg   BP (!) 157/92 (BP Location: Left Arm)   Pulse  81   Temp 98 F (36.7 C)   Resp 18   Ht '5\' 10"'$  (1.778 m)   Wt 70.4 kg   SpO2 93%   BMI 22.28 kg/m   Patient is feeling well, no chest pain or dyspnea, has been out of bed  Neurology awake and alert ENT with mild pallor Cardiovascular with S1 and S2 present and rhythmic with no gallops Respiratory with no rales or wheezing Abdomen with no distention  No lower extremity edema   Condition at discharge: stable  The results of significant diagnostics from this hospitalization (including imaging, microbiology, ancillary and laboratory) are listed below for reference.   Imaging Studies: IR Fluoro Guide CV Line Right  Result Date: 06/20/2022 INDICATION: AKI EXAM: Ultrasound-guided puncture of the right internal jugular vein Placement of a tunneled hemodialysis catheter using fluoroscopic guidance MEDICATIONS: Per EMR ANESTHESIA/SEDATION: Moderate (conscious) sedation was employed during this procedure. A total of Versed 1 mg and Fentanyl 25 mcg was administered intravenously. Moderate Sedation Time: 25 minutes. The patient's level of consciousness and vital signs were monitored continuously by radiology nursing throughout the procedure under my direct supervision. FLUOROSCOPY TIME:  Fluoroscopy Time: 1.6 minutes (10 mGy) COMPLICATIONS: None immediate. PROCEDURE: Informed written consent was obtained from the patient after a thorough discussion of the procedural risks, benefits and alternatives. All questions were addressed.  Maximal Sterile Barrier Technique was utilized including caps, mask, sterile gowns, sterile gloves, sterile drape, hand hygiene and skin antiseptic. A timeout was performed prior to the initiation of the procedure. The patient was placed supine on the exam table. The right neck and chest was prepped and draped in the standard sterile fashion. Ultrasound was used to evaluate the right internal jugular vein, which found to be patent and suitable for access. An ultrasound image was permanently stored in the electronic medical record. Using ultrasound guidance, the right internal jugular vein was directly punctured using a 21 gauge micropuncture set. An 018 wire was advanced into the SVC, followed by serial tract dilation and placement of an 035 wire which was advanced into the IVC. Attention was then turned to an infraclavicular location, where a small dermatotomy was made after the administration of additional local anesthetic. From this location, a 19 cm tip-to-cuff hemodialysis catheter was tunneled to the venotomy site. A peel-away sheath was then advanced over the access wire. Through this peel-away sheath, the hemodialysis catheter was advanced into the central veins, such that the tip was positioned near the superior cavoatrial junction. The line was found to flush and aspirate appropriately. It was sutured to the skin using 0 silk suture, and the venotomy was closed with 4-0 Vicryl and Dermabond. A sterile dressing was placed. The patient tolerated the procedure well without immediate complication. IMPRESSION: Successful placement of a tunneled hemodialysis catheter via the right internal jugular vein. The line is ready for immediate use. Electronically Signed   By: Albin Felling M.D.   On: 06/20/2022 09:23   IR US Guide Vasc Access Right  Result Date: 06/20/2022 INDICATION: AKI EXAM: Ultrasound-guided puncture of the right internal jugular vein Placement of a tunneled hemodialysis catheter using  fluoroscopic guidance MEDICATIONS: Per EMR ANESTHESIA/SEDATION: Moderate (conscious) sedation was employed during this procedure. A total of Versed 1 mg and Fentanyl 25 mcg was administered intravenously. Moderate Sedation Time: 25 minutes. The patient's level of consciousness and vital signs were monitored continuously by radiology nursing throughout the procedure under my direct supervision. FLUOROSCOPY TIME:  Fluoroscopy Time: 1.6 minutes (10 mGy) COMPLICATIONS:  None immediate. PROCEDURE: Informed written consent was obtained from the patient after a thorough discussion of the procedural risks, benefits and alternatives. All questions were addressed. Maximal Sterile Barrier Technique was utilized including caps, mask, sterile gowns, sterile gloves, sterile drape, hand hygiene and skin antiseptic. A timeout was performed prior to the initiation of the procedure. The patient was placed supine on the exam table. The right neck and chest was prepped and draped in the standard sterile fashion. Ultrasound was used to evaluate the right internal jugular vein, which found to be patent and suitable for access. An ultrasound image was permanently stored in the electronic medical record. Using ultrasound guidance, the right internal jugular vein was directly punctured using a 21 gauge micropuncture set. An 018 wire was advanced into the SVC, followed by serial tract dilation and placement of an 035 wire which was advanced into the IVC. Attention was then turned to an infraclavicular location, where a small dermatotomy was made after the administration of additional local anesthetic. From this location, a 19 cm tip-to-cuff hemodialysis catheter was tunneled to the venotomy site. A peel-away sheath was then advanced over the access wire. Through this peel-away sheath, the hemodialysis catheter was advanced into the central veins, such that the tip was positioned near the superior cavoatrial junction. The line was found to  flush and aspirate appropriately. It was sutured to the skin using 0 silk suture, and the venotomy was closed with 4-0 Vicryl and Dermabond. A sterile dressing was placed. The patient tolerated the procedure well without immediate complication. IMPRESSION: Successful placement of a tunneled hemodialysis catheter via the right internal jugular vein. The line is ready for immediate use. Electronically Signed   By: Albin Felling M.D.   On: 06/20/2022 09:23   DG CHEST PORT 1 VIEW  Result Date: 06/19/2022 CLINICAL DATA:  Hypoxia EXAM: PORTABLE CHEST 1 VIEW COMPARISON:  Prior chest x-ray 06/18/2022 FINDINGS: Stable cardiomegaly. Mediastinal contours are unchanged. Pulmonary vascular congestion is present with diffuse interstitial prominence. Overall, these findings are slightly improved compared to yesterday. Probable moderate layering right pleural effusion with associated right basilar atelectasis. No pneumothorax. IMPRESSION: 1. Improving pulmonary edema. 2. Moderate layering right pleural effusion. 3. Extensive background chronic pulmonary parenchymal changes and bronchial wall thickening again noted. Electronically Signed   By: Jacqulynn Cadet M.D.   On: 06/19/2022 07:52   DG CHEST PORT 1 VIEW  Result Date: 06/18/2022 CLINICAL DATA:  Shortness of breath EXAM: PORTABLE CHEST 1 VIEW COMPARISON:  06/17/2022 FINDINGS: Transverse diameter of heart is increased. Central pulmonary vessels are prominent. Small right pleural effusion is seen. There are increased interstitial and alveolar markings in right lung. Left lateral CP angle is clear. There is no pneumothorax. IMPRESSION: Cardiomegaly. Increased interstitial and alveolar markings are seen in right lung which may suggest asymmetric pulmonary edema or multifocal pneumonia. Small right pleural effusion. Electronically Signed   By: Elmer Picker M.D.   On: 06/18/2022 12:49   DG CHEST PORT 1 VIEW  Result Date: 06/17/2022 CLINICAL DATA:  Short of breath  EXAM: PORTABLE CHEST 1 VIEW COMPARISON:  06/12/2022 FINDINGS: Two frontal views of the chest demonstrate a stable cardiac silhouette. Chronic scarring and fibrotic changes are again noted, stable. No acute airspace disease, effusion, or pneumothorax. No acute bony abnormalities. IMPRESSION: 1. Chronic background scarring and fibrosis. No acute airspace disease. Electronically Signed   By: Randa Ngo M.D.   On: 06/17/2022 21:38   MR ANGIO HEAD WO CONTRAST  Result Date: 06/16/2022 CLINICAL  DATA:  71 year old male code stroke presentation yesterday. EXAM: MRA HEAD WITHOUT CONTRAST TECHNIQUE: Angiographic images of the Circle of Willis were acquired using MRA technique without intravenous contrast. COMPARISON:  Brain MRI today. FINDINGS: Anterior circulation: Antegrade flow in both ICA siphons with mild siphon dolichoectasia. Mild siphon irregularity, but no siphon stenosis. Ophthalmic and posterior communicating artery origins are within normal limits. Patent carotid termini, MCA and ACA origins. Anterior communicating artery is normal. Azygous ACA A2 anatomy (normal variant). Visible ACA branches are within normal limits. Bilateral MCA M1 segments and bifurcations are patent without stenosis. Right MCA branches are within normal limits. There is mild irregularity and stenosis of the left MCA superior M2 division near its origin. Other left MCA branches are within normal limits. Posterior circulation: Antegrade flow in the distal left vertebral artery which appears dominant and supplies the basilar. No distal left vertebral stenosis, and the left PICA origin is patent. No flow signal in the distal right vertebral artery, which is likely occluded on series 9, image 24. Right AICA appears patent and dominant. Patent basilar artery with tortuosity. Bilateral AICA, SCA, and PCA origins are patent. Both posterior communicating arteries are present, the left is larger. Bilateral PCA branches are within normal limits.  Anatomic variants: Dominant left vertebral artery. Azygous ACA anatomy. Other: Brain MRI today reported separately. IMPRESSION: 1. 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 Dominant appearing left vertebral artery is patent and supplies the Basilar. 2. Generalized intracranial artery tortuosity with otherwise mild intracranial atherosclerosis (left MCA superior M2 division). Electronically Signed   By: Genevie Ann M.D.   On: 06/16/2022 05:58   MR BRAIN WO CONTRAST  Result Date: 06/16/2022 CLINICAL DATA:  71 year old male code stroke presentation yesterday. EXAM: MRI HEAD WITHOUT CONTRAST TECHNIQUE: Multiplanar, multiecho pulse sequences of the brain and surrounding structures were obtained without intravenous contrast. COMPARISON:  Head CT yesterday. FINDINGS: Brain: No restricted diffusion or evidence of acute infarction. There are scattered small chronic infarcts in both cerebellar hemispheres. Chronic small lacunar infarcts in the bilateral basal ganglia. Small area of cortical encephalomalacia in the posterior left parietal lobe (series 6, image 37). And similar small cortical encephalomalacia along the left superior frontal gyrus series 15, image 22. Mild if any chronic cerebral blood products, suggestion of a chronic subcortical white matter microhemorrhage in the left parietal lobe on series 18, image 34. Brainstem remains normal. No midline shift, mass effect, evidence of mass lesion, ventriculomegaly, extra-axial collection or acute intracranial hemorrhage. Cervicomedullary junction and pituitary are within normal limits. Vascular: Major intracranial vascular flow voids are preserved, the distal left vertebral artery appears dominant and is dolichoectatic. There is other generalized intracranial artery tortuosity. See MRA findings reported separately today. Skull and upper cervical spine: Negative for age visible cervical spine. Visualized bone marrow signal is within normal limits.  Sinuses/Orbits: Negative orbits. Paranasal Visualized paranasal sinuses and mastoids are stable and well aerated. Other: Visible internal auditory structures appear normal. Negative stylomastoid foramina, visible scalp and face soft tissues. IMPRESSION: 1. No acute intracranial abnormality. 2. Chronic small and medium-sized vessel ischemia in the Left MCA territory, bilateral cerebellum, and the bilateral basal ganglia. 3. Generalized intracranial artery tortuosity. MRA is reported separately. Electronically Signed   By: Genevie Ann M.D.   On: 06/16/2022 05:53   CT HEAD CODE STROKE WO CONTRAST  Result Date: 06/15/2022 CLINICAL DATA:  Code stroke.  Neuro deficit, acute, stroke suspected EXAM: CT HEAD WITHOUT CONTRAST TECHNIQUE: Contiguous axial images were obtained from the  base of the skull through the vertex without intravenous contrast. RADIATION DOSE REDUCTION: This exam was performed according to the departmental dose-optimization program which includes automated exposure control, adjustment of the mA and/or kV according to patient size and/or use of iterative reconstruction technique. COMPARISON:  Most recent available study is from 2012 FINDINGS: Brain: No acute intracranial hemorrhage, mass effect, or edema. Gray-white differentiation is preserved age-indeterminate but probably chronic bilateral cerebellar infarcts. Patchy and confluent areas of low-density in the supratentorial white matter are nonspecific but may reflect mild to moderate chronic microvascular ischemic changes. Prominence of the ventricles and sulci reflects generalized parenchymal volume loss. There is no extra-axial collection. Vascular: No hyperdense vessel. There is intracranial atherosclerotic calcification at the skull base. Skull: Unremarkable. Sinuses/Orbits: No acute abnormality. Other: Mastoid air cells are clear. ASPECTS ([place] Stroke Program Early CT Score) - Ganglionic level infarction (caudate, lentiform nuclei, internal  capsule, insula, M1-M3 cortex): 7 - Supraganglionic infarction (M4-M6 cortex): 3 Total score (0-10 with 10 being normal): 10 IMPRESSION: There is no acute intracranial hemorrhage or evidence of acute infarction. ASPECT score is 10. 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 Chronic microvascular ischemic changes. Electronically Signed   By: Macy Mis M.D.   On: 06/15/2022 16:54   ECHOCARDIOGRAM COMPLETE  Result Date: 06/12/2022    ECHOCARDIOGRAM REPORT   Patient Name:   ALCIDES NUTTING Date of Exam: 06/12/2022 Medical Rec #:  [id number]        Height:       70.0 in Accession #:    [id number]       Weight:       167.0 lb Date of Birth:  Sep 17, 1951       BSA:          1.933 m Patient Age:    6 years         BP:           163/104 mmHg Patient Gender: M                HR:           96 bpm. Exam Location:  Inpatient Procedure: 2D Echo Indications:    dyspnea. Left ventricular clot.  History:        Patient has no prior history of Echocardiogram examinations.                 Risk Factors:Dyslipidemia, Hypertension, Diabetes, Current                 Smoker and Sleep Apnea.  Sonographer:    Johny Chess RDCS Referring Phys: [id number] [place]  1. 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 Left ventricular ejection fraction, by estimation, is 30-35%. The left ventricle is abnormal. The left ventricle has focal regional wall motion abnormalities. The left ventricular internal cavity size was mildly dilated. There is akinesis of the left ventricular, apical septal wall, inferior wall, anterior wall and lateral wall. There is akinesis of the left ventricular, entire apical segment. There is severe hypokinesis of the left ventricular, basal-mid anterior wall. A falst tendon is present in the mid LV cavity.  2. Right ventricular systolic function is normal. The right  ventricular size is normal. There is moderately elevated pulmonary artery systolic pressure. The estimated right ventricular systolic pressure is 30.8 mmHg.  3. The mitral valve is normal in structure. Mild mitral  valve regurgitation. No evidence of mitral stenosis.  4. The aortic valve is tricuspid. There is moderate calcification of the aortic valve. Aortic valve regurgitation is not visualized. Aortic valve sclerosis/calcification is present, without any evidence of aortic stenosis.  5. The inferior vena cava is dilated in size with >50% respiratory variability, suggesting right atrial pressure of 8 mmHg. 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 Left ventricular ejection fraction, by estimation, is 30 to 35%. The left ventricle has moderately decreased function. The left ventricle demonstrates regional wall motion abnormalities. Definity contrast agent was given IV to delineate the left ventricular endocardial borders. The left ventricular internal cavity size was mildly dilated. There is no left ventricular hypertrophy. Left ventricular diastolic parameters are indeterminate. Elevated left ventricular end-diastolic pressure. Right Ventricle: The right ventricular size is normal. No increase in right ventricular wall thickness. Right ventricular systolic function is normal. There is moderately elevated pulmonary artery systolic pressure. The tricuspid regurgitant velocity is 3.06 m/s, and with an assumed right atrial pressure of 8 mmHg, the estimated right ventricular systolic pressure is 62.3 mmHg. Left Atrium: Left atrial size was normal in size. Right Atrium: Right atrial size was normal in size. Pericardium: There is no evidence of pericardial effusion. Mitral Valve: The mitral valve is normal in structure. Mild mitral annular calcification. Mild mitral valve regurgitation. No evidence  of mitral valve stenosis. Tricuspid Valve: The tricuspid valve is normal in structure. Tricuspid valve regurgitation is mild . No evidence of tricuspid stenosis. Aortic Valve: The aortic valve is tricuspid. There is moderate calcification of the aortic valve. Aortic valve regurgitation is not visualized. Aortic valve sclerosis/calcification is present, without any evidence of aortic stenosis. Pulmonic Valve: The pulmonic valve was normal in structure. Pulmonic valve regurgitation is not visualized. No evidence of pulmonic stenosis. Aorta: The aortic root is normal in size and structure. Venous: The inferior vena cava is dilated in size with greater than 50% respiratory variability, suggesting right atrial pressure of 8 mmHg. IAS/Shunts: No atrial level shunt detected by color flow Doppler.  LEFT VENTRICLE PLAX 2D LVIDd:         5.90 cm   Diastology LVIDs:         4.80 cm   LV e' medial:    4.35 cm/s LV PW:         0.90 cm   LV E/e' medial:  21.0 LV IVS:        0.90 cm   LV e' lateral:   6.20 cm/s LVOT diam:     2.30 cm   LV E/e' lateral: 14.8 LV SV:         59 LV SV Index:   31 LVOT Area:     4.15 cm  RIGHT VENTRICLE             IVC RV S prime:     16.10 cm/s  IVC diam: 2.20 cm TAPSE (M-mode): 1.8 cm LEFT ATRIUM             Index        RIGHT ATRIUM           Index LA diam:        4.20 cm 2.17 cm/m   RA Area:     11.40 cm LA Vol (A2C):   40.5 ml 20.95 ml/m  RA Volume:   24.30 ml  12.57 ml/m  LA Vol (A4C):   80.0 ml 41.38 ml/m LA Biplane Vol: 62.6 ml 32.38 ml/m  AORTIC VALVE LVOT Vmax:   84.80 cm/s LVOT Vmean:  56.100 cm/s LVOT VTI:    0.142 m  AORTA Ao Root diam: 3.20 cm Ao Asc diam:  3.20 cm MITRAL VALVE               TRICUSPID VALVE MV Area (PHT): 5.88 cm    TR Peak grad:   37.5 mmHg MV Decel Time: 129 msec    TR Vmax:        306.00 cm/s MV E velocity: 91.50 cm/s MV A velocity: 96.40 cm/s  SHUNTS MV E/A ratio:  0.95        Systemic VTI:  0.14 m                            Systemic Diam: 2.30 cm Fransico Him  MD Electronically signed by Fransico Him MD Signature Date/Time: 06/12/2022/5:48:41 PM    Final    US RENAL  Result Date: 06/12/2022 CLINICAL DATA:  Acute renal failure. Chronic kidney disease, stage IV. Partial left nephrectomy. EXAM: RENAL / URINARY TRACT ULTRASOUND COMPLETE COMPARISON:  MRI of the abdomen without and with contrast 09/12/2010. FINDINGS: Right Kidney: Renal measurements: 11.0 x 5.0 x 5.6 cm = volume: 162 mL. Renal parenchyma is isoechoic to the index organ, the liver. 2 simple cysts are present near the upper pole measuring 2.4 and 2.0 cm respectively. Left Kidney: Renal measurements: 9.5 x 5.5 x 4.7 cm = volume: 129 mL. Renal parenchyma is isoechoic to the index organ, the spleen. A simple cyst is present at the upper pole measuring up 2.2 cm and at the lower pole measuring up 1.8 cm. No stone or solid lesion is present. No obstruction is present. Bladder: Appears normal for degree of bladder distention. Other: None. IMPRESSION: 1. No acute abnormality. 2. Increased echogenicity of the renal parenchyma bilaterally. This is nonspecific, but commonly seen in the setting of medical renal disease. 3. Simple cysts of both kidneys.  Recommend no follow-up. Electronically Signed   By: San Morelle M.D.   On: 06/12/2022 14:32   DG Chest 2 View  Result Date: 06/12/2022 CLINICAL DATA:  Shortness of breath EXAM: CHEST - 2 VIEW COMPARISON:  CT chest dated 04/29/2022 FINDINGS: Subpleural reticulation/fibrosis in the lungs bilaterally, similar to prior CT chest, favoring chronic interstitial lung disease. No superimposed opacity suspicious for pneumonia. No pleural effusion or pneumothorax. The heart top-normal in size. Visualized osseous structures are within normal limits. IMPRESSION: Stable chronic interstitial lung disease. No evidence of acute cardiopulmonary disease. Electronically Signed   By: Julian Hy M.D.   On: 06/12/2022 04:00    Microbiology: Results for orders placed or  performed during the hospital encounter of 06/12/22  SARS Coronavirus 2 by RT PCR (hospital order, performed in [facility] hospital lab) *cepheid single result test* Anterior Nasal Swab     Status: None   Collection Time: 06/12/22  5:58 AM   Specimen: Anterior Nasal Swab  Result Value Ref Range Status   SARS Coronavirus 2 by RT PCR NEGATIVE NEGATIVE Final    Comment: (NOTE) SARS-CoV-2 target nucleic acids are NOT DETECTED.  The SARS-CoV-2 RNA is generally detectable in upper and lower respiratory specimens during the acute phase of infection. The lowest concentration of SARS-CoV-2 viral copies this assay can detect is 250 copies / mL. A negative result does not preclude  SARS-CoV-2 infection and should not be used as the sole basis for treatment or other patient management decisions.  A negative result may occur with improper specimen collection / handling, submission of specimen other than nasopharyngeal swab, presence of viral mutation(s) within the areas targeted by this assay, and inadequate number of viral copies (<250 copies / mL). A negative result must be combined with clinical observations, patient history, and epidemiological information.  Fact Sheet for Patients:   https://www.patel.info/  Fact Sheet for Healthcare Providers: https://hall.com/  This test is not yet approved or  cleared by the [place] FDA and has been authorized for detection and/or diagnosis of SARS-CoV-2 by FDA under an Emergency Use Authorization (EUA).  This EUA will remain in effect (meaning this test can be used) for the duration of the COVID-19 declaration under Section 564(b)(1) of the Act, 21 U.S.C. section 360bbb-3(b)(1), unless the authorization is terminated or revoked sooner.  Performed at [facility], [place] [address], [place], [place] [address]     Labs: CBC: Recent Labs  Lab 06/23/22 [id number] 06/24/22 0411 06/25/22 0347  06/26/22 0700 06/27/22 0611  WBC 11.4* 14.4* 14.3* 12.1* 10.5  HGB 9.6* 9.2* 9.0* 9.2* 9.4*  HCT 28.1* 27.9* 27.9* 27.5* 28.1*  MCV 88.4 89.1 90.9 91.4 91.2  PLT 190 184 202 162 794   Basic Metabolic Panel: Recent Labs  Lab 06/21/22 0338 06/22/22 0521 06/23/22 0651 06/24/22 0411 06/25/22 0347 06/26/22 1035 06/27/22 0611  NA 138 138 136 138 136 136 139  K 4.2 4.2 4.2 4.3 4.3 4.3 4.1  CL 98 99 99 97* 96* 97* 100  CO2 '23 23 22 26 23 26 27  '$ GLUCOSE 102* 105* 107* 103* 99 115* 95  BUN 68* 78* 91* 46* 65* 38* 50*  CREATININE 6.91* 8.14* 8.65* 6.11* 7.65* 5.60* 6.80*  CALCIUM 9.0 9.1 9.2 9.1 9.2 8.8* 8.9  PHOS 5.5* 5.7*  --   --  5.5* 5.1* 5.8*   Liver Function Tests: Recent Labs  Lab 06/21/22 0338 06/22/22 0521 06/25/22 0347 06/26/22 1035 06/27/22 0611  ALBUMIN 2.8* 2.7* 2.8* 2.7* 2.8*   CBG: Recent Labs  Lab 06/26/22 0600 06/26/22 1136 06/26/22 1541 06/26/22 2108 06/27/22 0607  GLUCAP 105* 113* 133* 99 95    Discharge time spent: greater than 30 minutes.  Signed: Tawni Millers, MD [facility] 06/27/2022

## 2022-06-28 ENCOUNTER — Other Ambulatory Visit (HOSPITAL_COMMUNITY): Payer: Self-pay

## 2022-06-28 ENCOUNTER — Other Ambulatory Visit (HOSPITAL_COMMUNITY): Payer: Self-pay | Admitting: Physician Assistant

## 2022-06-28 DIAGNOSIS — Z0189 Encounter for other specified special examinations: Secondary | ICD-10-CM

## 2022-06-28 NOTE — Progress Notes (Unsigned)
Office Visit    Patient Name: [name] Date of Encounter: 06/29/2022  Primary Care Provider:  Gerome Sam, MD Primary Cardiologist:  Shelva Majestic, MD Primary Electrophysiologist: None  Chief Complaint    Allen Cortez is a 71 y.o. 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) ESRD (on HD)  Past Medical History    Past Medical History:  Diagnosis Date   AAA (abdominal aortic aneurysm) ([place])    Abdominal wall mass of left lower quadrant    Basal cell carcinoma    CAD (coronary artery disease) 2011   moderate   Colon polyp    DDD (degenerative disc disease)    Depression    Diabetes mellitus type II    no meds   GERD (gastroesophageal reflux disease)    Hyperlipidemia    Neuromuscular disorder (HCC)    Osteopenia    PAD (peripheral artery disease) ([place])    Renal cell carcinoma    Restless leg syndrome    Sleep apnea    used a cpap yr ago-does not snore or use it now.   Past Surgical History:  Procedure Laterality Date   ABDOMINAL AORTIC ANEURYSM REPAIR     HEMORRHOID SURGERY     HERNIA REPAIR  03/06/12   ventral hernia repair   IR FLUORO GUIDE CV LINE RIGHT  06/20/2022   IR US GUIDE VASC ACCESS RIGHT  06/20/2022   NEPHRECTOMY     left due to renal cell carcenoma   VENTRAL HERNIA REPAIR  03/06/2012   Procedure: HERNIA REPAIR VENTRAL ADULT;  Surgeon: Joyice Faster. Cornett, MD;  Location: [facility];  Service: General;  Laterality: N/A;  excision of stitch granuloma    Allergies  Allergies  Allergen Reactions   Dilaudid [Hydromorphone Hcl] Other (See Comments)    hallucinations   Mirapex [Pramipexole Dihydrochloride] Other (See Comments)    Leg pain   Prednisone Other (See Comments)    irritability    History of Present Illness    Allen Cortez  is a 71 year old male with the above mention past medical history who presents  today for posthospital follow-up of acute on chronic CHF.  Allen Cortez was previously followed by Dr.Varanasi and LHC performed in 2011 that showed moderate nonobstructive CAD.  He was lost to follow-up until current hospitalization.  Patient had 2D echo ordered as part of work-up for recurrent AAA repair that showed LV thrombus.  He was started on Eliquis and referred for left heart cath that was completed on 05/31/2022.  Catheterization completed and showed multivessel CAD.  Patient was referred to CVTS for consideration of CABG that is still pending to take place in outpatient.  He is currently being treated medically. 2D echo with reduced LV systolic function with EF 30 to 35%, akinesis of the left ventricular, apical septal wall, inferior wall, anterior wall and lateral wall, akinesis of the left ventricular, entire apical segment. Severe hypokinesis of the left ventricular basal mid anterior wall. RV systolic function preserved. RVSP 45.5   Allen Cortez was readmitted by EMS on 06/12/2022 with increased shortness of breath and lower extremity pain.  He had also presented to the ED on 9/5 and found to have AKI with creatinine of 3.34.  He however left AMA prior to treatment completion.  ED patient had complaint of intermittent chest discomfort troponins were mildly elevated  and EKG showed no acute ST changes.  Chest x-ray showed chronic interstitial lung disease.  CVTS was consulted cardiac MRI for further evaluation of anterior wall viability for graft.  Due to patient's elevating creatinine currently unable to complete MRI at that time.  Fluid volume being managed by nephrology and GDMT due to renal function.  Code stroke was called on 5/13 due to left-sided headache and aphasia.  CVA was ruled out and patient symptoms resolved.  Tunneled dialysis catheter placed and required HD due to elevated creatinine considered to be ESRD.  Patient was transition from Eliquis to Coumadin.  Per HD patient was given okay to  proceed with cardiac MRI but received IV iron and was forced to delay procedure for at least 1 week.  Plan for outpatient MRI made at discharge.  Allen Cortez presents today for posthospital follow-up with his wife.  Since last being seen in the office patient reports that he is having leg pains and stomach pains.  His wife states that he was backed up while in the hospital and now is able to use the bathroom without difficulty now.  He has begun outpatient dialysis on Tuesday, Thursday, Saturday schedule.  He is currently tolerating medications without any adverse reactions.  Patient is currently scheduled for MRI in October for work-up of possible CABG.  Patient denies chest pain, palpitations, dyspnea, PND, orthopnea, nausea, vomiting, dizziness, syncope, edema, weight gain, or early satiety.     Home Medications    Current Outpatient Medications  Medication Sig Dispense Refill   acetaminophen (TYLENOL) 325 MG tablet Take 2 tablets (650 mg total) by mouth every 4 (four) hours as needed for headache or mild pain.     aspirin EC 81 MG tablet Take 1 tablet (81 mg total) by mouth daily. Swallow whole. 30 tablet 0   atorvastatin (LIPITOR) 40 MG tablet Take 40 mg by mouth at bedtime.     carvedilol (COREG) 6.25 MG tablet Take 1 tablet (6.25 mg total) by mouth 2 (two) times daily with a meal. 60 tablet 0   citalopram (CELEXA) 40 MG tablet Take 40 mg by mouth every evening.     clonazePAM (KLONOPIN) 0.5 MG tablet Take 0.5 mg by mouth at bedtime.     feeding supplement (ENSURE ENLIVE / ENSURE PLUS) LIQD Take 237 mLs by mouth 2 (two) times daily between meals. 14220 mL 0   furosemide (LASIX) 40 MG tablet Take 1 tablet (40 mg total) by mouth 2 (two) times daily. 60 tablet 0   hydrALAZINE (APRESOLINE) 25 MG tablet Take 1 tablet (25 mg total) by mouth every 8 (eight) hours. 90 tablet 0   isosorbide mononitrate (IMDUR) 30 MG 24 hr tablet Take 0.5 tablets (15 mg total) by mouth daily. 15 tablet 0    lansoprazole (PREVACID) 15 MG capsule Take 15 mg by mouth every morning.     multivitamin (RENA-VIT) TABS tablet Take 1 tablet by mouth at bedtime. 30 tablet 0   QUEtiapine (SEROQUEL) 300 MG tablet Take 300 mg by mouth at bedtime.     tretinoin (RETIN-A) 0.1 % cream as needed for irritation.     warfarin (COUMADIN) 5 MG tablet Take 1 tablet daily Monday, Wednesday, Friday, Saturday and Sunday and take half tablet daily on Tuesday and Thursday. 30 tablet 0   No current facility-administered medications for this visit.     Review of Systems  Please see the history of present illness.    (+) Loss of appetite (+) Leg  pain  All other systems reviewed and are otherwise negative except as noted above.  Physical Exam    Wt Readings from Last 3 Encounters:  06/29/22 156 lb (70.8 kg)  06/27/22 155 lb 4.8 oz (70.4 kg)  06/07/22 167 lb (75.8 kg)   VS: Vitals:   06/29/22 1453  BP: (!) 148/90  Pulse: 86  SpO2: 95%  ,Body mass index is 22.38 kg/m.  Constitutional:      Appearance: Healthy appearance. Not in distress.  Neck:     Vascular: JVD normal.  Pulmonary:     Effort: Pulmonary effort is normal.     Breath sounds: No wheezing. No rales. Diminished in the bases Cardiovascular:     Normal rate. Regular rhythm. Normal S1. Normal S2.      Murmurs: There is no murmur.  Edema:    Peripheral edema absent.  Abdominal:     Palpations: Abdomen is soft non tender. There is no hepatomegaly.  Skin:    General: Skin is warm and dry.  Neurological:     General: No focal deficit present.     Mental Status: Alert and oriented to person, place and time.     Cranial Nerves: Cranial nerves are intact.  EKG/LABS/Other Studies Reviewed    ECG personally reviewed by me today -none completed today  Lab Results  Component Value Date   WBC 10.5 06/27/2022   HGB 9.4 (L) 06/27/2022   HCT 28.1 (L) 06/27/2022   MCV 91.2 06/27/2022   PLT 185 06/27/2022   Lab Results  Component Value Date    CREATININE 6.80 (H) 06/27/2022   BUN 50 (H) 06/27/2022   NA 139 06/27/2022   K 4.1 06/27/2022   CL 100 06/27/2022   CO2 27 06/27/2022   Lab Results  Component Value Date   ALT 10 04/09/2014   AST 16 04/09/2014   ALKPHOS 80 04/09/2014   BILITOT 0.3 04/09/2014   Lab Results  Component Value Date   CHOL 113 06/15/2022   HDL 26 (L) 06/15/2022   LDLCALC 60 06/15/2022   TRIG 137 06/15/2022   CHOLHDL 4.3 06/15/2022    Lab Results  Component Value Date   HGBA1C 6.1 (H) 06/13/2022    Assessment & Plan    1.  Coronary artery disease: -LHC completed showing three-vessel CAD with CVTS consult for CABG -Scheduled to determine viability of anterior wall prior to performing bypass -Today patient reports no chest pain or discomfort -Continue GDMT with ASA 81 mg, Lipitor 40 mg, carvedilol, isosorbide mononitrate 15 mg  2.  LV mural thrombus: -Patient currently on Coumadin 5 mg -Patient's INR to be followed had Coumadin clinic  3.  HTN: -Blood pressure today was controlled at 148/90 -Continue hydralazine 25 mg 3 times daily and carvedilol 6.25 mg  4.  HFpEF: -with reduced LV systolic function with EF 30 to 35%, akinesis of the left ventricular, apical septal wall, inferior wall, anterior wall and lateral wall, akinesis of the left ventricular, entire apical segment -Continue GDMT as noted above  5.  HLD: -Continue atorvastatin and ASA 81 mg  Disposition: Follow-up with Shelva Majestic, MD or APP in scheduled   Medication Adjustments/Labs and Tests Ordered: Current medicines are reviewed at length with the patient today.  Concerns regarding medicines are outlined above.   Signed, Mable Fill, Marissa Nestle, NP 06/29/2022, 5:00 PM [facility]  Note:  This document was prepared using Dragon voice recognition software and may include unintentional dictation errors.

## 2022-06-29 ENCOUNTER — Other Ambulatory Visit (HOSPITAL_COMMUNITY): Payer: Self-pay

## 2022-06-29 ENCOUNTER — Ambulatory Visit (INDEPENDENT_AMBULATORY_CARE_PROVIDER_SITE_OTHER): Payer: Self-pay | Admitting: Pharmacist

## 2022-06-29 ENCOUNTER — Encounter: Payer: Self-pay | Admitting: Nurse Practitioner

## 2022-06-29 ENCOUNTER — Ambulatory Visit: Payer: No Typology Code available for payment source | Attending: Nurse Practitioner | Admitting: Nurse Practitioner

## 2022-06-29 VITALS — BP 148/90 | HR 86 | Ht 70.0 in | Wt 156.0 lb

## 2022-06-29 DIAGNOSIS — I25118 Atherosclerotic heart disease of native coronary artery with other forms of angina pectoris: Secondary | ICD-10-CM | POA: Insufficient documentation

## 2022-06-29 DIAGNOSIS — I10 Essential (primary) hypertension: Secondary | ICD-10-CM | POA: Insufficient documentation

## 2022-06-29 DIAGNOSIS — E785 Hyperlipidemia, unspecified: Secondary | ICD-10-CM | POA: Insufficient documentation

## 2022-06-29 DIAGNOSIS — I5033 Acute on chronic diastolic (congestive) heart failure: Secondary | ICD-10-CM | POA: Diagnosis not present

## 2022-06-29 DIAGNOSIS — D6859 Other primary thrombophilia: Secondary | ICD-10-CM

## 2022-06-29 DIAGNOSIS — I4891 Unspecified atrial fibrillation: Secondary | ICD-10-CM

## 2022-06-29 DIAGNOSIS — I513 Intracardiac thrombosis, not elsewhere classified: Secondary | ICD-10-CM

## 2022-06-29 DIAGNOSIS — Z5181 Encounter for therapeutic drug level monitoring: Secondary | ICD-10-CM | POA: Diagnosis not present

## 2022-06-29 DIAGNOSIS — Z7901 Long term (current) use of anticoagulants: Secondary | ICD-10-CM | POA: Insufficient documentation

## 2022-06-29 LAB — POCT INR: INR: 4.9 — AB (ref 2–3)

## 2022-06-29 NOTE — Patient Instructions (Signed)
Hold warfarin tonight and tomorrow then start taking warfarin '5mg'$  (1 tablet) daily except 2.'5mg'$  (1/2 tablet) on Tuesday, Thursday and Saturday.  Keep ensure at 1 per day. If you want to increase please let us know. Call with any questions or medication changes [phone]

## 2022-06-29 NOTE — Patient Instructions (Signed)
Medication Instructions:   *If you need a refill on your cardiac medications before your next appointment, please call your pharmacy*   Lab Work:  If you have labs (blood work) drawn today and your tests are completely normal, you will receive your results only by: [place] (if you have MyChart) OR A paper copy in the mail If you have any lab test that is abnormal or we need to change your treatment, we will call you to review the results.   Testing/Procedures:    Follow-Up: At [facility], you and your health needs are our priority.  As part of our continuing mission to provide you with exceptional heart care, we have created designated Provider Care Teams.  These Care Teams include your primary Cardiologist (physician) and Advanced Practice Providers (APPs -  Physician Assistants and Nurse Practitioners) who all work together to provide you with the care you need, when you need it.  We recommend signing up for the patient portal called "MyChart".  Sign up information is provided on this After Visit Summary.  MyChart is used to connect with patients for Virtual Visits (Telemedicine).  Patients are able to view lab/test results, encounter notes, upcoming appointments, etc.  Non-urgent messages can be sent to your provider as well.   To learn more about what you can do with MyChart, go to [email or web address].    Your next appointment:  Next available for Dr Claiborne Billings at [place] [facility]

## 2022-06-29 NOTE — Progress Notes (Signed)
Patient was drinking about 1.5 ensure in the hospital. Now that he is home, only drinking about 1. Does not have much appetite, not eating much. Was d/c from hospital Monday. New HD start.

## 2022-06-30 ENCOUNTER — Other Ambulatory Visit (HOSPITAL_COMMUNITY): Payer: Self-pay

## 2022-07-02 ENCOUNTER — Other Ambulatory Visit: Payer: Self-pay

## 2022-07-02 ENCOUNTER — Encounter (HOSPITAL_BASED_OUTPATIENT_CLINIC_OR_DEPARTMENT_OTHER): Payer: Self-pay | Admitting: Emergency Medicine

## 2022-07-02 ENCOUNTER — Emergency Department (HOSPITAL_BASED_OUTPATIENT_CLINIC_OR_DEPARTMENT_OTHER)
Admission: EM | Admit: 2022-07-02 | Discharge: 2022-07-02 | Disposition: A | Discharge status: Home / Self Care | Payer: No Typology Code available for payment source | Attending: Emergency Medicine | Admitting: Emergency Medicine

## 2022-07-02 DIAGNOSIS — M79604 Pain in right leg: Secondary | ICD-10-CM | POA: Insufficient documentation

## 2022-07-02 DIAGNOSIS — Z7982 Long term (current) use of aspirin: Secondary | ICD-10-CM | POA: Diagnosis not present

## 2022-07-02 DIAGNOSIS — M79651 Pain in right thigh: Secondary | ICD-10-CM

## 2022-07-02 DIAGNOSIS — Z79899 Other long term (current) drug therapy: Secondary | ICD-10-CM | POA: Diagnosis not present

## 2022-07-02 DIAGNOSIS — M79605 Pain in left leg: Secondary | ICD-10-CM | POA: Diagnosis not present

## 2022-07-02 LAB — CBC WITH DIFFERENTIAL/PLATELET
Abs Immature Granulocytes: 0.06 10*3/uL (ref 0.00–0.07)
Basophils Absolute: 0.1 10*3/uL (ref 0.0–0.1)
Basophils Relative: 1 %
Eosinophils Absolute: 0.5 10*3/uL (ref 0.0–0.5)
Eosinophils Relative: 5 %
HCT: 27.7 % — ABNORMAL LOW (ref 39.0–52.0)
Hemoglobin: 8.6 g/dL — ABNORMAL LOW (ref 13.0–17.0)
Immature Granulocytes: 1 %
Lymphocytes Relative: 17 %
Lymphs Abs: 1.8 10*3/uL (ref 0.7–4.0)
MCH: 29.9 pg (ref 26.0–34.0)
MCHC: 31 g/dL (ref 30.0–36.0)
MCV: 96.2 fL (ref 80.0–100.0)
Monocytes Absolute: 1 10*3/uL (ref 0.1–1.0)
Monocytes Relative: 9 %
Neutro Abs: 7.4 10*3/uL (ref 1.7–7.7)
Neutrophils Relative %: 67 %
Platelets: 183 10*3/uL (ref 150–400)
RBC: 2.88 MIL/uL — ABNORMAL LOW (ref 4.22–5.81)
RDW: 16.8 % — ABNORMAL HIGH (ref 11.5–15.5)
WBC: 10.8 10*3/uL — ABNORMAL HIGH (ref 4.0–10.5)
nRBC: 0 % (ref 0.0–0.2)

## 2022-07-02 LAB — BASIC METABOLIC PANEL
Anion gap: 11 (ref 5–15)
BUN: 21 mg/dL (ref 8–23)
CO2: 30 mmol/L (ref 22–32)
Calcium: 8.8 mg/dL — ABNORMAL LOW (ref 8.9–10.3)
Chloride: 98 mmol/L (ref 98–111)
Creatinine, Ser: 3.74 mg/dL — ABNORMAL HIGH (ref 0.61–1.24)
GFR, Estimated: 16 mL/min — ABNORMAL LOW (ref >60)
Glucose, Bld: 127 mg/dL — ABNORMAL HIGH (ref 70–99)
Potassium: 4.5 mmol/L (ref 3.5–5.1)
Sodium: 139 mmol/L (ref 135–145)

## 2022-07-02 LAB — PROTIME-INR
INR: 2 — ABNORMAL HIGH (ref 0.8–1.2)
Prothrombin Time: 22.9 seconds — ABNORMAL HIGH (ref 11.4–15.2)

## 2022-07-02 LAB — CK: Total CK: 36 U/L — ABNORMAL LOW (ref 49–397)

## 2022-07-02 MED ORDER — OXYCODONE HCL 5 MG PO TABS: 2.5000 mg | ORAL_TABLET | Freq: Four times a day (QID) | ORAL | 0 refills | Status: DC | PRN

## 2022-07-02 MED ORDER — OXYCODONE-ACETAMINOPHEN 5-325 MG PO TABS
2.0000 | ORAL_TABLET | Freq: Once | ORAL | Status: AC
  Administered 2022-07-02: 2 via ORAL
  Filled 2022-07-02: qty 2

## 2022-07-02 MED ORDER — ONDANSETRON 4 MG PO TBDP
4.0000 mg | ORAL_TABLET | Freq: Once | ORAL | Status: AC
  Administered 2022-07-02: 4 mg via ORAL
  Filled 2022-07-02: qty 1

## 2022-07-02 NOTE — ED Notes (Signed)
Needs labs drawn.

## 2022-07-02 NOTE — ED Provider Notes (Signed)
[facility] EMERGENCY DEPT Provider Note   CSN: [id number] Arrival date & time: 07/02/22  1400     History  Chief Complaint  Patient presents with   Leg Pain    Allen Cortez is a 71 y.o. male who presents emergency department with bilateral severe leg cramping and pain.  Patient was admitted and then discharged on 06/27/2022.  He was admitted for acute kidney injury with end-stage renal disease due to acute tubular necrosis.  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  He has chronic hypoxic respiratory failure due to COPD on oxygen.  Type 2 diabetes, hyperlipidemia.  History of partial left nephrectomy.  Patient was also found to be in volume overload requiring diuresis.  Patient has had about 2 rounds of outpatient dialysis.  He complains of severe bilateral leg pain.  He is unable to sleep at night.  His son who is at bedside states that he was on gabapentin for years but taken off of this recently due to nephrotoxicity.   Leg Pain      Home Medications Prior to Admission medications   Medication Sig Start Date End Date Taking? Authorizing Provider  acetaminophen (TYLENOL) 325 MG tablet Take 2 tablets (650 mg total) by mouth every 4 (four) hours as needed for headache or mild pain. 06/27/22   Arrien, Jimmy Picket, MD  aspirin EC 81 MG tablet Take 1 tablet (81 mg total) by mouth daily. Swallow whole. 06/28/22 07/28/22  Arrien, Jimmy Picket, MD  atorvastatin (LIPITOR) 40 MG tablet Take 40 mg by mouth at bedtime. 05/24/22   [provider]  carvedilol (COREG) 6.25 MG tablet Take 1 tablet (6.25 mg total) by mouth 2 (two) times daily with a meal. 06/27/22 07/27/22  Arrien, Jimmy Picket, MD  citalopram (CELEXA) 40 MG tablet Take 40 mg by mouth every evening.    [provider]  clonazePAM (KLONOPIN) 0.5 MG tablet Take 0.5 mg by mouth at bedtime. 10/03/21   [provider]  feeding supplement (ENSURE  ENLIVE / ENSURE PLUS) LIQD Take 237 mLs by mouth 2 (two) times daily between meals. 06/27/22 07/27/22  Arrien, Jimmy Picket, MD  furosemide (LASIX) 40 MG tablet Take 1 tablet (40 mg total) by mouth 2 (two) times daily. 06/27/22 07/27/22  Arrien, Jimmy Picket, MD  hydrALAZINE (APRESOLINE) 25 MG tablet Take 1 tablet (25 mg total) by mouth every 8 (eight) hours. 06/27/22 07/27/22  Arrien, Jimmy Picket, MD  isosorbide mononitrate (IMDUR) 30 MG 24 hr tablet Take 0.5 tablets (15 mg total) by mouth daily. 06/28/22 07/28/22  Arrien, Jimmy Picket, MD  lansoprazole (PREVACID) 15 MG capsule Take 15 mg by mouth every morning. 01/24/22   [provider]  multivitamin (RENA-VIT) TABS tablet Take 1 tablet by mouth at bedtime. 06/27/22 07/27/22  Arrien, Jimmy Picket, MD  QUEtiapine (SEROQUEL) 300 MG tablet Take 300 mg by mouth at bedtime. 10/03/17   [provider]  tretinoin (RETIN-A) 0.1 % cream as needed for irritation. 02/11/22   [provider]  warfarin (COUMADIN) 5 MG tablet Take 1 tablet daily Monday, Wednesday, Friday, Saturday and Sunday and take half tablet daily on Tuesday and Thursday. 06/27/22   Arrien, Jimmy Picket, MD      Allergies    Dilaudid Greer Pickerel hcl], Mirapex [pramipexole dihydrochloride], and Prednisone    Review of Systems   Review of Systems  Physical Exam Updated Vital Signs BP 132/82 (BP Location: Right Arm)   Pulse  81   Temp 98.4 F (36.9 C)   Resp 20   SpO2 100%  Physical Exam Vitals and nursing note reviewed.  Constitutional:      General: He is not in acute distress.    Appearance: He is well-developed. He is ill-appearing. He is not toxic-appearing or diaphoretic.  HENT:     Head: Normocephalic and atraumatic.  Eyes:     General: No scleral icterus.    Conjunctiva/sclera: Conjunctivae normal.  Cardiovascular:     Rate and Rhythm: Normal rate and regular rhythm.     Pulses:          Dorsalis pedis pulses are 2+ on the  right side and 2+ on the left side.       Posterior tibial pulses are 2+ on the right side and 2+ on the left side.     Heart sounds: Normal heart sounds.  Pulmonary:     Effort: Pulmonary effort is normal. No respiratory distress.     Breath sounds: Normal breath sounds.  Abdominal:     Palpations: Abdomen is soft.     Tenderness: There is no abdominal tenderness.  Musculoskeletal:     Cervical back: Normal range of motion and neck supple.  Skin:    General: Skin is warm and dry.  Neurological:     Mental Status: He is alert.  Psychiatric:        Behavior: Behavior normal.     ED Results / Procedures / Treatments   Labs (all labs ordered are listed, but only abnormal results are displayed) Labs Reviewed  CBC WITH DIFFERENTIAL/PLATELET - Abnormal; Notable for the following components:      Result Value   WBC 10.8 (*)    RBC 2.88 (*)    Hemoglobin 8.6 (*)    HCT 27.7 (*)    RDW 16.8 (*)    All other components within normal limits  PROTIME-INR - Abnormal; Notable for the following components:   Prothrombin Time 22.9 (*)    INR 2.0 (*)    All other components within normal limits  BASIC METABOLIC PANEL  CK    EKG None  Radiology No results found.  Procedures Procedures    Medications Ordered in ED Medications  oxyCODONE-acetaminophen (PERCOCET/ROXICET) 5-325 MG per tablet 2 tablet (2 tablets Oral Given 07/02/22 1555)  ondansetron (ZOFRAN-ODT) disintegrating tablet 4 mg (4 mg Oral Given 07/02/22 1555)    ED Course/ Medical Decision Making/ A&P Clinical Course as of 07/02/22 2027  Sat Jul 02, 2022  2011 INR(!): 2.0 INR in therapeutic range [AH]  2011 Creatinine(!): 3.74 Creatinine improving from previous [AH]  2012 WBC(!): 10.8 Mildly elevated white blood cell count [AH]    Clinical Course User Index [AH] Margarita Mail, PA-C                           Medical Decision Making 71 year old male who presents emergency department with complex medical  history and multiple comorbidities for severe bilateral thigh pain.  Differential diagnosis includes claudication, neurogenic versus arterial, electrolyte abnormality, side effect of end-stage renal disease and hemodialysis, calciphylaxis.  On physical examination patient has bilateral palpable pulses in the feet, warm well-perfused feet with cap refill less than 2 seconds. He has no weakness in the lower extremities. I ordered and reviewed labs that included CK which is normal and shows no evidence of rhabdomyolysis.  CBC shows mildly elevated white blood cell count BMP with elevated blood  glucose, creatinine improving from previous, PT and INR shows therapeutic range.  Patient treated in the emergency department with oral Percocet with significant improvement in his pain.  I have low suspicion for neuropathy. Will treat the patient with oral pain medication.  PDMP reviewed.  Will discharge with oxycodone.  Discussed outpatient follow-up and return precautions.  Given referral to vascular surgery to rule out vascular disease as the cause.  Patient appears otherwise appropriate for discharge at this time  Amount and/or Complexity of Data Reviewed Labs: ordered. Decision-making details documented in ED Course.  Risk Prescription drug management.           Final Clinical Impression(s) / ED Diagnoses Final diagnoses:  None    Rx / DC Orders ED Discharge Orders     None         Margarita Mail, PA-C 07/02/22 2301    Ezequiel Essex, MD 07/03/22 0030

## 2022-07-02 NOTE — ED Provider Triage Note (Signed)
Emergency Medicine Provider Triage Evaluation Note  Allen Cortez , a 71 y.o. male  was evaluated in triage.  Pt complains of leg pain. Recent admission (extensive issues see chart) newly on HD. Having sever BL leg pain. Taken off gabapentin.   Review of Systems  Positive: Leg pain  Negative: swelling  Physical Exam  BP 132/82 (BP Location: Right Arm)   Pulse 81   Temp 98.4 F (36.9 C)   Resp 20   SpO2 100%  Gen:   Awake, no distress   Resp:  Normal effort  MSK:   Moves extremities without difficulty  Other:  BL PT pulses 2+  Medical Decision Making  Medically screening exam initiated at 3:48 PM.  Appropriate orders placed.  [name] was informed that the remainder of the evaluation will be completed by another provider, this initial triage assessment does not replace that evaluation, and the importance of remaining in the ED until their evaluation is complete.  Work up initiated.   Margarita Mail, PA-C 07/02/22 1551

## 2022-07-02 NOTE — ED Triage Notes (Signed)
Pt is new dialysis patient, he is having severe pain to both legs ,cramping.

## 2022-07-02 NOTE — Discharge Instructions (Addendum)
Please be aware that oxycodone can make you constipated.   Gets much fluid as you are able to drink based on your dialysis needs.  Also take fiber supplements or MiraLAX if you are having trouble moving or bowels. Get help right away if: You have chest pain. You have trouble breathing. Your foot or leg is cold or it changes color. Your foot or leg becomes numb. You have any symptoms of a stroke. "BE FAST" is an easy way to remember the main warning signs of a stroke: B - Balance. Signs are dizziness, sudden trouble walking, or loss of balance. E - Eyes. Signs are trouble seeing or a sudden change in vision. F - Face. Signs are sudden weakness or numbness of the face, or the face or eyelid drooping on one side. A - Arms. Signs are weakness or numbness in an arm. This happens suddenly and usually on one side of the body. S - Speech. Signs are sudden trouble speaking, slurred speech, or trouble understanding what people say. T - Time. Time to call emergency services. Write down what time symptoms started. You have other signs of a stroke, such as: A sudden, severe headache with no known cause. Nausea or vomiting. Seizure.

## 2022-07-05 ENCOUNTER — Telehealth: Payer: Self-pay | Admitting: Cardiovascular Disease

## 2022-07-05 NOTE — Telephone Encounter (Signed)
ok 

## 2022-07-05 NOTE — Telephone Encounter (Signed)
Spoke with spouse who reports that when patient got out of bed and went to bathroom, his O2 sat dropped to 76-80. She increased his O2 from 3 to 4L/m. O2 came up to 92%. Education on not increasing O2 any higher without contacting clinic first. While standing, he gets "excruciating pain" in his legs. His toes get blue and "ice cold" when he sits in recliner. Education on not using heating pad and to protect feet from injury. She uses a regular blanket to cover his feet. She does not know how long his toes have been blue. No edema in legs/feet. He gets short of breath easily. Appointment made with Janan Ridge, PA for 10/6.

## 2022-07-05 NOTE — Telephone Encounter (Signed)
Pt c/o Shortness Of Breath: STAT if SOB developed within the last 24 hours or pt is noticeably SOB on the phone  1. Are you currently SOB (can you hear that pt is SOB on the phone)?   No  2. How long have you been experiencing SOB?  Since Sunday morning  3. Are you SOB when sitting or when up moving around?   Both sitting and when moving around  4. Are you currently experiencing any other symptoms?    Wife called concerned that the patient's oxygen stats has been dropping - from 53 down to 23's and he has been having excruciating pain in his legs.  Oxygen set at 4 liters.

## 2022-07-06 ENCOUNTER — Ambulatory Visit: Payer: No Typology Code available for payment source | Attending: Cardiology

## 2022-07-06 DIAGNOSIS — I513 Intracardiac thrombosis, not elsewhere classified: Secondary | ICD-10-CM | POA: Diagnosis not present

## 2022-07-06 DIAGNOSIS — D6859 Other primary thrombophilia: Secondary | ICD-10-CM | POA: Diagnosis not present

## 2022-07-06 DIAGNOSIS — Z7901 Long term (current) use of anticoagulants: Secondary | ICD-10-CM

## 2022-07-06 LAB — POCT INR: INR: 5.3 — AB (ref 2.0–3.0)

## 2022-07-06 NOTE — Patient Instructions (Addendum)
Description   Hold warfarin tonight and tomorrow then START taking warfarin 0.5 tablet daily except 1 tablet on Mondays.  Stay consistent with Nepro (1-2 per day)  Call with any questions or medication changes [phone]

## 2022-07-08 ENCOUNTER — Encounter: Payer: Self-pay | Admitting: Physician Assistant

## 2022-07-08 ENCOUNTER — Other Ambulatory Visit: Payer: Self-pay | Admitting: Physician Assistant

## 2022-07-08 ENCOUNTER — Other Ambulatory Visit (HOSPITAL_COMMUNITY): Payer: No Typology Code available for payment source

## 2022-07-08 ENCOUNTER — Ambulatory Visit: Payer: No Typology Code available for payment source | Attending: Physician Assistant | Admitting: Physician Assistant

## 2022-07-08 ENCOUNTER — Ambulatory Visit
Admission: RE | Admit: 2022-07-08 | Discharge: 2022-07-08 | Disposition: A | Discharge status: Home / Self Care | Payer: Medicare Other | Source: Ambulatory Visit | Attending: Physician Assistant | Admitting: Physician Assistant

## 2022-07-08 VITALS — BP 134/78 | HR 80 | Ht 70.0 in | Wt 156.6 lb

## 2022-07-08 DIAGNOSIS — N186 End stage renal disease: Secondary | ICD-10-CM | POA: Insufficient documentation

## 2022-07-08 DIAGNOSIS — E119 Type 2 diabetes mellitus without complications: Secondary | ICD-10-CM | POA: Insufficient documentation

## 2022-07-08 DIAGNOSIS — I2510 Atherosclerotic heart disease of native coronary artery without angina pectoris: Secondary | ICD-10-CM | POA: Diagnosis not present

## 2022-07-08 DIAGNOSIS — R0602 Shortness of breath: Secondary | ICD-10-CM

## 2022-07-08 DIAGNOSIS — I255 Ischemic cardiomyopathy: Secondary | ICD-10-CM | POA: Diagnosis not present

## 2022-07-08 DIAGNOSIS — M79604 Pain in right leg: Secondary | ICD-10-CM | POA: Insufficient documentation

## 2022-07-08 DIAGNOSIS — Z9889 Other specified postprocedural states: Secondary | ICD-10-CM | POA: Diagnosis not present

## 2022-07-08 DIAGNOSIS — M79605 Pain in left leg: Secondary | ICD-10-CM | POA: Insufficient documentation

## 2022-07-08 DIAGNOSIS — E785 Hyperlipidemia, unspecified: Secondary | ICD-10-CM | POA: Insufficient documentation

## 2022-07-08 DIAGNOSIS — Z992 Dependence on renal dialysis: Secondary | ICD-10-CM | POA: Insufficient documentation

## 2022-07-08 DIAGNOSIS — Z8679 Personal history of other diseases of the circulatory system: Secondary | ICD-10-CM | POA: Insufficient documentation

## 2022-07-08 DIAGNOSIS — I513 Intracardiac thrombosis, not elsewhere classified: Secondary | ICD-10-CM | POA: Insufficient documentation

## 2022-07-08 DIAGNOSIS — I10 Essential (primary) hypertension: Secondary | ICD-10-CM | POA: Insufficient documentation

## 2022-07-08 MED ORDER — OXYCODONE HCL 5 MG PO TABS: 2.5000 mg | ORAL_TABLET | Freq: Four times a day (QID) | ORAL | 0 refills | Status: DC | PRN

## 2022-07-08 NOTE — Patient Instructions (Signed)
Medication Instructions:  Your physician recommends that you continue on your current medications as directed. Please refer to the Current Medication list given to you today.  *If you need a refill on your cardiac medications before your next appointment, please call your pharmacy*  Lab Work: NONE ordered at this time of appointment   If you have labs (blood work) drawn today and your tests are completely normal, you will receive your results only by: [facility] (if you have MyChart) OR A paper copy in the mail If you have any lab test that is abnormal or we need to change your treatment, we will call you to review the results.  Testing/Procedures: Almyra Deforest, PA-C has requested that you have a chest x-ray. A chest x-ray takes a picture of the organs and structures inside the chest, including the heart, lungs, and blood vessels. This test can show several things, including, whether the heart is enlarges; whether fluid is building up in the lungs; and whether pacemaker / defibrillator leads are still in place.  Almyra Deforest, PA-C has requested that you have a lower extremity arterial exercise duplex. During this test, exercise and ultrasound are used to evaluate arterial blood flow in the legs. Allow one hour for this exam. There are no restrictions or special instructions.   Follow-Up: At [facility], you and your health needs are our priority.  As part of our continuing mission to provide you with exceptional heart care, we have created designated Provider Care Teams.  These Care Teams include your primary Cardiologist (physician) and Advanced Practice Providers (APPs -  Physician Assistants and Nurse Practitioners) who all work together to provide you with the care you need, when you need it.  Your next appointment:   3 week(s)  The format for your next appointment:   In Person  Provider:   Almyra Deforest, PA-C        Other Instructions   Important Information About  [place]

## 2022-07-08 NOTE — Telephone Encounter (Signed)
Patient seen in office today 10/06

## 2022-07-08 NOTE — Progress Notes (Signed)
Cardiology Office Note:    Date:  07/10/2022   ID:  Allen Cortez, DOB Jan 04, 1951, MRN [id number]  PCP:  Gerome Sam, MD   [place] [facility] Cardiologist:  Shelva Majestic, MD     Referring MD: Gerome Sam*   Chief Complaint  Patient presents with   Follow-up    Seen for Dr. Claiborne Billings    History of Present Illness:    Allen Cortez is a 71 y.o. male with a hx of nonobstructive CAD noted on previous cath 2011, AAA s/p repair 2014-2015, hypertension, hyperlipidemia, DM2, obstructive sleep apnea, GERD, renal cell carcinoma s/p partial left nephrectomy 2015-2016, abdominal wall mass of left lower quadrant and CKD stage III.  Since his previous cardiac catheterization in 2011, he has been lost to follow-up.  PCP recently ordered an echocardiogram as part of work-up for AAA and reportedly showed cardiomegaly with LV thrombus.  He was started on Eliquis.  He was subsequently referred for cardiac catheterization at [facility].  He had this done on 05/31/2022 and that reportedly showed 100% occlusion in the "front of his heart" and 80% occlusion in the "back of his heart".  No stent was placed.  He was referred to Dr. Kipp Brood for consideration of CABG.  Following his cath, he had a significant neural lower extremity pain and has been barely able to walk.  Lower extremity venous Doppler obtained at [facility] on 06/08/2022 was negative for DVT.  He had good bilateral posterior tibial pulses.  He was seen in the ED on 06/07/2022 however left before seeing.  Lab work at that visit showed significant AKI with creatinine of 3.34.  Baseline creatinine was about 1.5-1.6.  He returned back to the emergency room on 06/09/2022 with complaint of worsening dyspnea.  On arrival, he was hypertensive, O2 saturation 95% on 5 L of nasal cannula.  Serial troponin was elevated at 200.  Chest x-ray showed chronic interstitial lung disease with no acute finding.  He was seen by cardiology service.  Repeat  echocardiogram obtained on 06/12/2022 showed a large apical aneurysm with mobile lucency in the apex measuring 1.07 x 1.72 cm consistent with LV thrombus, EF 30 to 35%, regional wall motion abnormality present, severe hypokinesis of the LV basal mid anterior wall, moderately elevated pulmonary artery systolic pressure of 18.5 mmHg, mild MR.  Carvedilol was added to his medical regimen.  Cath films from [facility] was reviewed, patient had a CTO of mid LAD and severe mid left circumflex stenosis on 05/31/2022.  The best course option is still proceed with bypass surgery.  Initial plan was to proceed with cardiac MRI for viability of the anterior wall, however this was hindered by his AKI.  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  He was started on dialysis, currently on TThSat schedule.  Eventually, it was decided for the patient to obtain cardiac MRI as outpatient.  Eliquis was switched to Coumadin which has more date for treatment LV thrombus.  Patient was seen by Ambrose Pancoast NP on 06/29/2022, at which time he was doing well.  Since leaving the hospital, he was seen back in the ED on 07/02/2022 with significant neck pain.  According to family member, he was on gabapentin for years however taken off recently due to nephrotoxicity.  Card MRI scheduled for 10/12.  Patient presents today for evaluation of shortness of breath and leg pain.  He is currently on 3  L 24/7 of oxygen.  He went to dialysis yesterday and tried to pull fluid off of him but however could not do so due to onset of severe neck pain.  I recommend a 1 view chest x-ray today to evaluate for pulmonary edema (as I doubt he can stand up to get the 2 view x ray).  On physical exam, he has diminished breath sound and crackles in the lower one quarter of both lungs.  He does not have significant leg edema.  Since the patient has end-stage renal disease, nephrology service will need to  manage his volume status.  His nephrologist is Dr. Corliss Parish.  He also describe severe bilateral leg pain from the upper thigh all the way down to the foot.  His wife mentioned occasional mottling and bluish discoloration of the lower extremity. It was mentioned he always had some bad pain, however leg pain started mainly in August after his cath at [facility]. On physical exam, he has good posterior tibial pulse bilaterally, however I cannot feel the dorsalis pedis pulse.  Given the recent diagnosis of LV thrombus, I am concerned about embolic event to his lower extremity arterial system.  I will recommend a multisegmental arterial vascular ultrasound.  I discussed his case with DOD Dr. Sallyanne Kuster, we do believe the patient is experiencing excruciating pain.  He has run out of pain medication, we gave him a one-time prescription for 5 days of oxycodone to help manage the pain.  He is aware this is a one-time prescription and no further refills will be given from [place].  He will need to see his PCP at [facility] to obtain more pain control.  Past Medical History:  Diagnosis Date   AAA (abdominal aortic aneurysm) (HCC)    Abdominal wall mass of left lower quadrant    Basal cell carcinoma    CAD (coronary artery disease) 2011   moderate   Colon polyp    DDD (degenerative disc disease)    Depression    Diabetes mellitus type II    no meds   GERD (gastroesophageal reflux disease)    Hyperlipidemia    Neuromuscular disorder (HCC)    Osteopenia    PAD (peripheral artery disease) (HCC)    Renal cell carcinoma    Restless leg syndrome    Sleep apnea    used a cpap yr ago-does not snore or use it now.    Past Surgical History:  Procedure Laterality Date   ABDOMINAL AORTIC ANEURYSM REPAIR     HEMORRHOID SURGERY     HERNIA REPAIR  03/06/12   ventral hernia repair   IR FLUORO GUIDE CV LINE RIGHT  06/20/2022   IR US GUIDE VASC ACCESS RIGHT  06/20/2022   NEPHRECTOMY     left due to renal cell carcenoma    VENTRAL HERNIA REPAIR  03/06/2012   Procedure: HERNIA REPAIR VENTRAL ADULT;  Surgeon: Joyice Faster. Cornett, MD;  Location: [facility];  Service: General;  Laterality: N/A;  excision of stitch granuloma    Current Medications: Current Meds  Medication Sig   acetaminophen (TYLENOL) 325 MG tablet Take 2 tablets (650 mg total) by mouth every 4 (four) hours as needed for headache or mild pain.   aspirin EC 81 MG tablet Take 1 tablet (81 mg total) by mouth daily. Swallow whole.   atorvastatin (LIPITOR) 40 MG tablet Take 40 mg by mouth at bedtime.   carvedilol (COREG) 6.25 MG tablet Take 1 tablet (6.25 mg total)  by mouth 2 (two) times daily with a meal.   citalopram (CELEXA) 40 MG tablet Take 40 mg by mouth every evening.   clonazePAM (KLONOPIN) 0.5 MG tablet Take 0.5 mg by mouth at bedtime.   feeding supplement (ENSURE ENLIVE / ENSURE PLUS) LIQD Take 237 mLs by mouth 2 (two) times daily between meals.   furosemide (LASIX) 40 MG tablet Take 1 tablet (40 mg total) by mouth 2 (two) times daily.   hydrALAZINE (APRESOLINE) 25 MG tablet Take 1 tablet (25 mg total) by mouth every 8 (eight) hours.   isosorbide mononitrate (IMDUR) 30 MG 24 hr tablet Take 0.5 tablets (15 mg total) by mouth daily.   lansoprazole (PREVACID) 15 MG capsule Take 15 mg by mouth every morning.   multivitamin (RENA-VIT) TABS tablet Take 1 tablet by mouth at bedtime.   QUEtiapine (SEROQUEL) 300 MG tablet Take 300 mg by mouth at bedtime.   tretinoin (RETIN-A) 0.1 % cream as needed for irritation.   warfarin (COUMADIN) 5 MG tablet Take 1 tablet daily Monday, Wednesday, Friday, Saturday and Sunday and take half tablet daily on Tuesday and Thursday.   [DISCONTINUED] oxyCODONE (ROXICODONE) 5 MG immediate release tablet Take 0.5-1 tablets (2.5-5 mg total) by mouth every 6 (six) hours as needed for severe pain.     Allergies:   Dilaudid [hydromorphone hcl], Mirapex [pramipexole dihydrochloride], and Prednisone   Social History    Socioeconomic History   Marital status: Married    Spouse name: Not on file   Number of children: Not on file   Years of education: Not on file   Highest education level: Not on file  Occupational History   Not on file  Tobacco Use   Smoking status: Former    Packs/day: 1.00    Types: Cigarettes    Quit date: 05/09/2022    Years since quitting: 0.1   Smokeless tobacco: Never  Substance and Sexual Activity   Alcohol use: Yes    Comment: glass of wine at night   Drug use: No   Sexual activity: Not on file  Other Topics Concern   Not on file  Social History Narrative   Not on file   Social Determinants of Health   Financial Resource Strain: Not on file  Food Insecurity: Not on file  Transportation Needs: Not on file  Physical Activity: Not on file  Stress: Not on file  Social Connections: Not on file     Family History: The patient's family history includes Aneurysm in his mother; Cancer in his father and sister; Depression in his father and sister; Diabetes in his mother; Heart disease in his sister; Pancreatitis in his brother.  ROS:   Please see the history of present illness.     All other systems reviewed and are negative.  EKGs/Labs/Other Studies Reviewed:    The following studies were reviewed today:  Echo 06/12/2022  1. 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  Left ventricular ejection fraction, by estimation, is 30-35%. The left  ventricle is abnormal. The left  ventricle has focal regional wall motion abnormalities. The left  ventricular internal cavity size was mildly dilated. There is akinesis of  the left ventricular, apical septal wall, inferior wall, anterior wall and  lateral wall. There is akinesis of the  left ventricular, entire apical segment. There is severe hypokinesis of  the left ventricular, basal-mid anterior wall. A  falst tendon is  present  in the mid LV cavity.   2. Right ventricular systolic function is normal. The right ventricular  size is normal. There is moderately elevated pulmonary artery systolic  pressure. The estimated right ventricular systolic pressure is 85.4 mmHg.   3. The mitral valve is normal in structure. Mild mitral valve  regurgitation. No evidence of mitral stenosis.   4. The aortic valve is tricuspid. There is moderate calcification of the  aortic valve. Aortic valve regurgitation is not visualized. Aortic valve  sclerosis/calcification is present, without any evidence of aortic  stenosis.   5. The inferior vena cava is dilated in size with >50% respiratory  variability, suggesting right atrial pressure of 8 mmHg.   EKG:  EKG is not ordered today.    Recent Labs: 06/07/2022: Magnesium 1.9 06/19/2022: B Natriuretic Peptide 1,453.6 07/02/2022: BUN 21; Creatinine, Ser 3.74; Hemoglobin 8.6; Platelets 183; Potassium 4.5; Sodium 139  Recent Lipid Panel    Component Value Date/Time   CHOL 113 06/15/2022 0455   TRIG 137 06/15/2022 0455   HDL 26 (L) 06/15/2022 0455   CHOLHDL 4.3 06/15/2022 0455   VLDL 27 06/15/2022 0455   LDLCALC 60 06/15/2022 0455     Risk Assessment/Calculations:           Physical Exam:    VS:  BP 134/78   Pulse 80   Ht '5\' 10"'$  (1.778 m)   Wt 156 lb 9.6 oz (71 kg)   SpO2 94% Comment: 3L of oxygen nasal  BMI 22.47 kg/m        Wt Readings from Last 3 Encounters:  07/08/22 156 lb 9.6 oz (71 kg)  06/29/22 156 lb (70.8 kg)  06/27/22 155 lb 4.8 oz (70.4 kg)     GEN:  Well nourished, well developed in no acute distress HEENT: Normal NECK: No JVD; No carotid bruits LYMPHATICS: No lymphadenopathy CARDIAC: RRR, no murmurs, rubs, gallops RESPIRATORY:  Clear to auscultation without rales, wheezing or rhonchi  ABDOMEN: Soft, non-tender, non-distended MUSCULOSKELETAL:  No edema; No deformity  SKIN: Warm and dry NEUROLOGIC:  Alert and oriented x 3 PSYCHIATRIC:   Normal affect   ASSESSMENT:    1. SOB (shortness of breath)   2. Leg pain, bilateral   3. Coronary artery disease involving native coronary artery of native heart without angina pectoris   4. S/P AAA repair   5. Hypertension, unspecified type   6. Hyperlipidemia LDL goal <70   7. Controlled type 2 diabetes mellitus without complication, without long-term current use of insulin (Allen Cortez)   8. LV (left ventricular) mural thrombus   9. ESRD on dialysis (Allen Cortez)   10. Ischemic cardiomyopathy    PLAN:    In order of problems listed above:  Dyspnea: Patient did not finish his dialysis yesterday due to significant leg pain.  On physical exam, his lower 1/4 of both lungs had crackles.  I suspect he is volume overloaded.  He is due for dialysis tomorrow.  Volume is managed by nephrology service  Bilateral leg pain: According to wife, he had back pain prior to his cath at [facility] in August, however since then, he has been having excruciating lower extremity pain.  She described bluish discoloration and mottling of bilateral lower extremity.  On physical exam, he had good posterior tibial pulse however poor dorsalis pedis pulse.  Leg pain is extending from the upper thigh all the way down to the foot.  He has a history of LV thrombus, therefore there  is also concern for distal embolization.  I will obtain segmental arterial Doppler.  He was given oxycodone for pain control when he left the hospital, he has not been able to see his PCP, he is scheduled for next week.  I discussed the case with Dr. Sallyanne Kuster DOD.  We do believe the patient is experiencing significant debilitating pain and will prescribe 5 days of pain medication to help control his symptoms.  He understands this is a one-time prescription, further pain control will need to be from his PCP.  CAD: Denies any recent chest pain.  Cardiac catheterization at [facility] revealed occluded mid LAD and severe disease in mid left circumflex artery.  He is scheduled to  undergo cardiac MRI for viability test to see if anterior wall is still viable.  He will be seeing Dr. Kipp Brood of CT surgery afterward to discuss possibility of CABG  History of AAA repair: No acute concern  Hypertension: Blood pressure stable  Hyperlipidemia: On Lipitor  DM2: Managed by primary care provider  LV thrombus: Identified on echocardiogram obtained at [facility].  Reconfirmed on recent echocardiogram at home.  On Coumadin therapy.  Recent supratherapeutic, patient has been holding Coumadin for 2 days and will resume today.  Upcoming visit with Coumadin clinic  End-stage renal disease on dialysis: Patient's previous creatinine has been around 1.6 range, however has had significant deterioration recently.  He has been started on dialysis therapy, dialysis scheduled Tuesday Thursday and Saturday.  He is being followed by Dr. Corliss Parish of nephrology service  Ischemic cardiomyopathy: Continue carvedilol, hydralazine and Imdur           Medication Adjustments/Labs and Tests Ordered: Current medicines are reviewed at length with the patient today.  Concerns regarding medicines are outlined above.  Orders Placed This Encounter  Procedures   VAS [place] LOWER EXT ART SEG MULTI (SEGMENTALS & LE RAYNAUDS)   Meds ordered this encounter  Medications   DISCONTD: oxyCODONE (ROXICODONE) 5 MG immediate release tablet    Sig: Take 0.5-1 tablets (2.5-5 mg total) by mouth every 6 (six) hours as needed for severe pain.    Dispense:  20 tablet    Refill:  0    Will defer additional rx to PCP, for severe leg pain    Order Specific Question:   Supervising Provider    Answer:   Lelon Perla [[id number]]   oxyCODONE (ROXICODONE) 5 MG immediate release tablet    Sig: Take 0.5-1 tablets (2.5-5 mg total) by mouth every 6 (six) hours as needed for up to 5 days for severe pain.    Dispense:  20 tablet    Refill:  0    Will defer additional rx to PCP, for severe leg pain    Order Specific  Question:   Supervising Provider    Answer:   Lelon Perla [[id number]]    Patient Instructions  Medication Instructions:  Your physician recommends that you continue on your current medications as directed. Please refer to the Current Medication list given to you today.  *If you need a refill on your cardiac medications before your next appointment, please call your pharmacy*  Lab Work: NONE ordered at this time of appointment   If you have labs (blood work) drawn today and your tests are completely normal, you will receive your results only by: [place] (if you have MyChart) OR A paper copy in the mail If you have any lab test that is abnormal or we need to change  your treatment, we will call you to review the results.  Testing/Procedures: Almyra Deforest, PA-C has requested that you have a chest x-ray. A chest x-ray takes a picture of the organs and structures inside the chest, including the heart, lungs, and blood vessels. This test can show several things, including, whether the heart is enlarges; whether fluid is building up in the lungs; and whether pacemaker / defibrillator leads are still in place.  Almyra Deforest, PA-C has requested that you have a lower extremity arterial exercise duplex. During this test, exercise and ultrasound are used to evaluate arterial blood flow in the legs. Allow one hour for this exam. There are no restrictions or special instructions.   Follow-Up: At [facility], you and your health needs are our priority.  As part of our continuing mission to provide you with exceptional heart care, we have created designated Provider Care Teams.  These Care Teams include your primary Cardiologist (physician) and Advanced Practice Providers (APPs -  Physician Assistants and Nurse Practitioners) who all work together to provide you with the care you need, when you need it.  Your next appointment:   3 week(s)  The format for your next appointment:   In  Person  Provider:   Almyra Deforest, PA-C        Other Instructions   Important Information About [place]         Allen Cortez, [place]  07/10/2022 9:21 PM    [place]

## 2022-07-09 NOTE — Progress Notes (Signed)
Spoke with patient's wife, discussed fluid in the patient's lung consistent with physical exam. She has informed me that patient underwent full dialysis this morning. His O2 sat when he woke up this morning was in the 70s as his nasal cannula fell off. I asked the patient's wife to call [employer] to see if they have anything to keep [place] on the nose at night. O2 sat improved since this morning. Mental status also improved. Requested Mrs. Haynes to keep close eye on his breathing in the next few days.

## 2022-07-10 ENCOUNTER — Encounter: Payer: Self-pay | Admitting: Physician Assistant

## 2022-07-11 ENCOUNTER — Telehealth: Payer: Self-pay | Admitting: Physician Assistant

## 2022-07-11 ENCOUNTER — Telehealth: Payer: Self-pay | Admitting: Cardiovascular Disease

## 2022-07-11 ENCOUNTER — Ambulatory Visit (HOSPITAL_COMMUNITY)
Admission: RE | Admit: 2022-07-11 | Discharge: 2022-07-11 | Disposition: A | Discharge status: Home / Self Care | Payer: No Typology Code available for payment source | Source: Ambulatory Visit | Attending: Physician Assistant | Admitting: Physician Assistant

## 2022-07-11 DIAGNOSIS — M79604 Pain in right leg: Secondary | ICD-10-CM

## 2022-07-11 DIAGNOSIS — M79605 Pain in left leg: Secondary | ICD-10-CM | POA: Diagnosis not present

## 2022-07-11 MED ORDER — FUROSEMIDE 40 MG PO TABS: 40.0000 mg | ORAL_TABLET | Freq: Two times a day (BID) | ORAL | 3 refills | Status: DC

## 2022-07-11 MED ORDER — CARVEDILOL 6.25 MG PO TABS: 6.2500 mg | ORAL_TABLET | Freq: Two times a day (BID) | ORAL | 3 refills | Status: DC

## 2022-07-11 MED ORDER — HYDRALAZINE HCL 25 MG PO TABS: 25.0000 mg | ORAL_TABLET | Freq: Three times a day (TID) | ORAL | 3 refills | Status: DC

## 2022-07-11 MED ORDER — ISOSORBIDE MONONITRATE ER 30 MG PO TB24: 15.0000 mg | ORAL_TABLET | Freq: Every day | ORAL | 0 refills | Status: DC

## 2022-07-11 MED ORDER — ASPIRIN 81 MG PO TBEC: 81.0000 mg | DELAYED_RELEASE_TABLET | Freq: Every day | ORAL | 3 refills | Status: DC

## 2022-07-11 NOTE — Telephone Encounter (Signed)
*  STAT* If patient is at the pharmacy, call can be transferred to refill team.   1. Which medications need to be refilled? (please list name of each medication and dose if known)   aspirin EC 81 MG tablet    carvedilol (COREG) 6.25 MG tablet    furosemide (LASIX) 40 MG tablet    hydrALAZINE (APRESOLINE) 25 MG tablet    isosorbide mononitrate (IMDUR) 30 MG 24 hr tablet    2. Which pharmacy/location (including street and city if local pharmacy) is medication to be sent to? [id number] [place] [place] [address]  3. Do they need a 30 day or 90 day supply?  30 day  Pt's son would also like a callback regarding pt's portable oxygen. Please advise

## 2022-07-11 NOTE — Telephone Encounter (Signed)
New Message.        Allen Cortez is calling to verify handwritten prescription for OXycodone.

## 2022-07-12 ENCOUNTER — Telehealth: Payer: Self-pay

## 2022-07-12 ENCOUNTER — Other Ambulatory Visit (HOSPITAL_COMMUNITY): Payer: Self-pay

## 2022-07-12 ENCOUNTER — Telehealth: Payer: Self-pay | Admitting: Cardiovascular Disease

## 2022-07-12 ENCOUNTER — Telehealth: Payer: Self-pay | Admitting: Physician Assistant

## 2022-07-12 ENCOUNTER — Other Ambulatory Visit: Payer: Self-pay | Admitting: Physician Assistant

## 2022-07-12 MED ORDER — OXYCODONE HCL 5 MG PO TABS: 2.5000 mg | ORAL_TABLET | Freq: Four times a day (QID) | ORAL | 0 refills | Status: AC | PRN

## 2022-07-12 NOTE — Telephone Encounter (Signed)
Patient's son is calling about his father's oxygen he said he has one of the portable pull along ones, it is difficult for his dad to use.  He wants to know if it can be switched to a carry one.

## 2022-07-12 NOTE — Telephone Encounter (Signed)
Returned call to patients son (okay per DPR) who states that patient needs a new wheelchair that is portable instead of the one that he was discharged with. Advised patients son to reach out to patients PCP (has an appointment tomorrow) regarding this as Cardiology did not order. Patients son aware and grateful for return call. Advised to call back with any issues, questions, or concerns. Patients son verbalized understanding.

## 2022-07-12 NOTE — Telephone Encounter (Signed)
Patient's son called about a wheelchair that was ordered for him.  He states it's too heavy for his mother he wants to transport wheelchair can be ordered, so it can be lifted by his mother.

## 2022-07-12 NOTE — Telephone Encounter (Signed)
Returned call to patients son(okay per DPR) who states that he is not sure who ordered the patients oxygen when patient was discharged recently from the hospital but states that patient needs a portable oxygen concentrator now due to having issues carrying his large tank. Advised patients son to reach out to [place] (patients PCP) regarding this, as I do not see that it was ordered by cardiology. Son confirmed that patient does have oxygen at the moment and just would like a different tank. Patients son will reach out to PCP.

## 2022-07-12 NOTE — Telephone Encounter (Signed)
We were unable to send in oxycodone last Friday electronically as we did not have a fingerprint scanner to do verification.  I gave the patient a paper prescription, however pharmacy could not fill it.  Spoke with pharmacy staff this afternoon, they can only accept a electronic prescription for oxycodone.   [facility] was able [facility]  [facility] have [facility].  I have also informed the patient's son who can pick up the prescription for his father.  Note, [facility], I have instructed them to shred the paper prescription.  This is a one-time prescription as the patient has severe lower leg pain.  No further prescription for narcotic will be given from cardiology service.  He has been instructed to follow-up with his PCP for pain management.

## 2022-07-12 NOTE — Telephone Encounter (Signed)
Pt's wife calling stating that pt can not get his medication oxycodone, because pharmacy needs approval from the doctor first to be able to refill pt's medication. Wife would like a call back concerning this matter. Please address

## 2022-07-13 ENCOUNTER — Encounter: Payer: Self-pay | Admitting: Cardiovascular Disease

## 2022-07-13 ENCOUNTER — Ambulatory Visit (INDEPENDENT_AMBULATORY_CARE_PROVIDER_SITE_OTHER): Payer: No Typology Code available for payment source | Admitting: *Deleted

## 2022-07-13 ENCOUNTER — Ambulatory Visit: Payer: Medicare Other

## 2022-07-13 ENCOUNTER — Telehealth (HOSPITAL_COMMUNITY): Payer: Self-pay | Admitting: *Deleted

## 2022-07-13 DIAGNOSIS — D6859 Other primary thrombophilia: Secondary | ICD-10-CM

## 2022-07-13 DIAGNOSIS — I513 Intracardiac thrombosis, not elsewhere classified: Secondary | ICD-10-CM

## 2022-07-13 DIAGNOSIS — Z7901 Long term (current) use of anticoagulants: Secondary | ICD-10-CM | POA: Diagnosis not present

## 2022-07-13 LAB — POCT INR: INR: 3.5 — AB (ref 2.0–3.0)

## 2022-07-13 NOTE — Patient Instructions (Addendum)
Description   Spoke with Consuello Bossier [facility] RN and advised to hold today's dose then start taking warfarin 1/2 tablet daily. Recheck INR in 1 week. Stay consistent with Nepro (1-2 per day).  Call with any questions or medication changes [phone]

## 2022-07-13 NOTE — Telephone Encounter (Signed)
Reaching out to patient to offer assistance regarding upcoming cardiac imaging study; pt verbalizes understanding of appt date/time, parking situation and where to check in, and verified current allergies; name and call back number provided for further questions should they arise  Gordy Clement RN [place] [facility] [phone] office [phone] cell  Patient's wife denies that he has any metal or claustrophobia. Pt get dialysis Tuesday, Thursday, Saturday.

## 2022-07-14 ENCOUNTER — Encounter (HOSPITAL_COMMUNITY): Payer: Self-pay

## 2022-07-14 ENCOUNTER — Ambulatory Visit (HOSPITAL_COMMUNITY)
Admit: 2022-07-14 | Discharge: 2022-07-14 | Disposition: A | Discharge status: Home / Self Care | Payer: Medicare Other | Attending: Physician Assistant | Admitting: Physician Assistant

## 2022-07-14 DIAGNOSIS — Z0189 Encounter for other specified special examinations: Secondary | ICD-10-CM

## 2022-07-14 MED ORDER — CARVEDILOL 6.25 MG PO TABS: 6.2500 mg | ORAL_TABLET | Freq: Two times a day (BID) | ORAL | 3 refills | Status: DC

## 2022-07-14 MED ORDER — HYDRALAZINE HCL 25 MG PO TABS: 25.0000 mg | ORAL_TABLET | Freq: Three times a day (TID) | ORAL | 3 refills | Status: DC

## 2022-07-14 MED ORDER — ISOSORBIDE MONONITRATE ER 30 MG PO TB24: 15.0000 mg | ORAL_TABLET | Freq: Every day | ORAL | 3 refills | Status: DC

## 2022-07-14 MED ORDER — FUROSEMIDE 40 MG PO TABS: 40.0000 mg | ORAL_TABLET | Freq: Two times a day (BID) | ORAL | 3 refills | Status: DC

## 2022-07-15 ENCOUNTER — Other Ambulatory Visit (HOSPITAL_COMMUNITY): Payer: Self-pay | Admitting: Physician Assistant

## 2022-07-15 ENCOUNTER — Ambulatory Visit (HOSPITAL_COMMUNITY)
Admission: RE | Admit: 2022-07-15 | Discharge: 2022-07-15 | Disposition: A | Discharge status: Home / Self Care | Payer: No Typology Code available for payment source | Source: Ambulatory Visit | Attending: Physician Assistant | Admitting: Physician Assistant

## 2022-07-15 DIAGNOSIS — I3139 Other pericardial effusion (noninflammatory): Secondary | ICD-10-CM

## 2022-07-15 DIAGNOSIS — Z0189 Encounter for other specified special examinations: Secondary | ICD-10-CM

## 2022-07-15 MED ORDER — GADOBUTROL 1 MMOL/ML IV SOLN
10.0000 mL | Freq: Once | INTRAVENOUS | Status: AC | PRN
  Administered 2022-07-15: 10 mL via INTRAVENOUS

## 2022-07-18 ENCOUNTER — Inpatient Hospital Stay (HOSPITAL_COMMUNITY)
Admission: EM | Admit: 2022-07-18 | Discharge: 2022-08-02 | DRG: 321 | Disposition: A | Discharge status: Hospice | Payer: No Typology Code available for payment source | Attending: Internal Medicine | Admitting: Internal Medicine

## 2022-07-18 ENCOUNTER — Encounter (HOSPITAL_COMMUNITY): Payer: Self-pay | Admitting: Emergency Medicine

## 2022-07-18 ENCOUNTER — Emergency Department (HOSPITAL_COMMUNITY): Payer: No Typology Code available for payment source

## 2022-07-18 DIAGNOSIS — E1122 Type 2 diabetes mellitus with diabetic chronic kidney disease: Secondary | ICD-10-CM | POA: Diagnosis present

## 2022-07-18 DIAGNOSIS — K56609 Unspecified intestinal obstruction, unspecified as to partial versus complete obstruction: Secondary | ICD-10-CM | POA: Diagnosis not present

## 2022-07-18 DIAGNOSIS — K920 Hematemesis: Secondary | ICD-10-CM | POA: Diagnosis not present

## 2022-07-18 DIAGNOSIS — G4733 Obstructive sleep apnea (adult) (pediatric): Secondary | ICD-10-CM | POA: Diagnosis present

## 2022-07-18 DIAGNOSIS — R7989 Other specified abnormal findings of blood chemistry: Secondary | ICD-10-CM

## 2022-07-18 DIAGNOSIS — J9611 Chronic respiratory failure with hypoxia: Secondary | ICD-10-CM | POA: Diagnosis not present

## 2022-07-18 DIAGNOSIS — I25110 Atherosclerotic heart disease of native coronary artery with unstable angina pectoris: Secondary | ICD-10-CM | POA: Diagnosis present

## 2022-07-18 DIAGNOSIS — I513 Intracardiac thrombosis, not elsewhere classified: Secondary | ICD-10-CM | POA: Diagnosis not present

## 2022-07-18 DIAGNOSIS — J690 Pneumonitis due to inhalation of food and vomit: Secondary | ICD-10-CM | POA: Diagnosis not present

## 2022-07-18 DIAGNOSIS — Z905 Acquired absence of kidney: Secondary | ICD-10-CM

## 2022-07-18 DIAGNOSIS — Z66 Do not resuscitate: Secondary | ICD-10-CM | POA: Diagnosis present

## 2022-07-18 DIAGNOSIS — I25119 Atherosclerotic heart disease of native coronary artery with unspecified angina pectoris: Secondary | ICD-10-CM | POA: Diagnosis not present

## 2022-07-18 DIAGNOSIS — G709 Myoneural disorder, unspecified: Secondary | ICD-10-CM | POA: Diagnosis not present

## 2022-07-18 DIAGNOSIS — E1169 Type 2 diabetes mellitus with other specified complication: Secondary | ICD-10-CM | POA: Diagnosis present

## 2022-07-18 DIAGNOSIS — Z20822 Contact with and (suspected) exposure to covid-19: Secondary | ICD-10-CM | POA: Diagnosis present

## 2022-07-18 DIAGNOSIS — R54 Age-related physical debility: Secondary | ICD-10-CM | POA: Diagnosis present

## 2022-07-18 DIAGNOSIS — K219 Gastro-esophageal reflux disease without esophagitis: Secondary | ICD-10-CM | POA: Diagnosis not present

## 2022-07-18 DIAGNOSIS — Z7901 Long term (current) use of anticoagulants: Secondary | ICD-10-CM

## 2022-07-18 DIAGNOSIS — N186 End stage renal disease: Secondary | ICD-10-CM | POA: Diagnosis present

## 2022-07-18 DIAGNOSIS — R627 Adult failure to thrive: Secondary | ICD-10-CM | POA: Diagnosis present

## 2022-07-18 DIAGNOSIS — F332 Major depressive disorder, recurrent severe without psychotic features: Secondary | ICD-10-CM | POA: Diagnosis present

## 2022-07-18 DIAGNOSIS — I132 Hypertensive heart and chronic kidney disease with heart failure and with stage 5 chronic kidney disease, or end stage renal disease: Secondary | ICD-10-CM | POA: Diagnosis present

## 2022-07-18 DIAGNOSIS — Z7982 Long term (current) use of aspirin: Secondary | ICD-10-CM

## 2022-07-18 DIAGNOSIS — J449 Chronic obstructive pulmonary disease, unspecified: Secondary | ICD-10-CM | POA: Diagnosis present

## 2022-07-18 DIAGNOSIS — I953 Hypotension of hemodialysis: Secondary | ICD-10-CM | POA: Diagnosis not present

## 2022-07-18 DIAGNOSIS — E871 Hypo-osmolality and hyponatremia: Secondary | ICD-10-CM | POA: Diagnosis not present

## 2022-07-18 DIAGNOSIS — Z833 Family history of diabetes mellitus: Secondary | ICD-10-CM

## 2022-07-18 DIAGNOSIS — I25118 Atherosclerotic heart disease of native coronary artery with other forms of angina pectoris: Secondary | ICD-10-CM | POA: Diagnosis not present

## 2022-07-18 DIAGNOSIS — E785 Hyperlipidemia, unspecified: Secondary | ICD-10-CM | POA: Diagnosis not present

## 2022-07-18 DIAGNOSIS — J9621 Acute and chronic respiratory failure with hypoxia: Secondary | ICD-10-CM | POA: Diagnosis present

## 2022-07-18 DIAGNOSIS — Z992 Dependence on renal dialysis: Secondary | ICD-10-CM | POA: Diagnosis not present

## 2022-07-18 DIAGNOSIS — I714 Abdominal aortic aneurysm, without rupture, unspecified: Secondary | ICD-10-CM | POA: Diagnosis present

## 2022-07-18 DIAGNOSIS — Z818 Family history of other mental and behavioral disorders: Secondary | ICD-10-CM

## 2022-07-18 DIAGNOSIS — F4321 Adjustment disorder with depressed mood: Secondary | ICD-10-CM | POA: Diagnosis present

## 2022-07-18 DIAGNOSIS — R45851 Suicidal ideations: Secondary | ICD-10-CM | POA: Diagnosis not present

## 2022-07-18 DIAGNOSIS — Z79899 Other long term (current) drug therapy: Secondary | ICD-10-CM

## 2022-07-18 DIAGNOSIS — D62 Acute posthemorrhagic anemia: Secondary | ICD-10-CM | POA: Diagnosis present

## 2022-07-18 DIAGNOSIS — I5043 Acute on chronic combined systolic (congestive) and diastolic (congestive) heart failure: Secondary | ICD-10-CM | POA: Diagnosis present

## 2022-07-18 DIAGNOSIS — I255 Ischemic cardiomyopathy: Secondary | ICD-10-CM

## 2022-07-18 DIAGNOSIS — I5023 Acute on chronic systolic (congestive) heart failure: Principal | ICD-10-CM

## 2022-07-18 DIAGNOSIS — F17200 Nicotine dependence, unspecified, uncomplicated: Secondary | ICD-10-CM | POA: Diagnosis not present

## 2022-07-18 DIAGNOSIS — D509 Iron deficiency anemia, unspecified: Secondary | ICD-10-CM | POA: Diagnosis present

## 2022-07-18 DIAGNOSIS — I21A1 Myocardial infarction type 2: Secondary | ICD-10-CM | POA: Diagnosis present

## 2022-07-18 DIAGNOSIS — N170 Acute kidney failure with tubular necrosis: Secondary | ICD-10-CM | POA: Diagnosis present

## 2022-07-18 DIAGNOSIS — Z801 Family history of malignant neoplasm of trachea, bronchus and lung: Secondary | ICD-10-CM

## 2022-07-18 DIAGNOSIS — D649 Anemia, unspecified: Secondary | ICD-10-CM | POA: Diagnosis not present

## 2022-07-18 DIAGNOSIS — I480 Paroxysmal atrial fibrillation: Secondary | ICD-10-CM | POA: Diagnosis present

## 2022-07-18 DIAGNOSIS — Z515 Encounter for palliative care: Secondary | ICD-10-CM | POA: Diagnosis not present

## 2022-07-18 DIAGNOSIS — R410 Disorientation, unspecified: Secondary | ICD-10-CM | POA: Diagnosis not present

## 2022-07-18 DIAGNOSIS — Z993 Dependence on wheelchair: Secondary | ICD-10-CM

## 2022-07-18 DIAGNOSIS — G4730 Sleep apnea, unspecified: Secondary | ICD-10-CM | POA: Diagnosis present

## 2022-07-18 DIAGNOSIS — Z87891 Personal history of nicotine dependence: Secondary | ICD-10-CM

## 2022-07-18 DIAGNOSIS — F4310 Post-traumatic stress disorder, unspecified: Secondary | ICD-10-CM | POA: Diagnosis not present

## 2022-07-18 DIAGNOSIS — E440 Moderate protein-calorie malnutrition: Secondary | ICD-10-CM | POA: Diagnosis present

## 2022-07-18 DIAGNOSIS — Z955 Presence of coronary angioplasty implant and graft: Secondary | ICD-10-CM

## 2022-07-18 DIAGNOSIS — G2581 Restless legs syndrome: Secondary | ICD-10-CM | POA: Diagnosis present

## 2022-07-18 DIAGNOSIS — Z85528 Personal history of other malignant neoplasm of kidney: Secondary | ICD-10-CM

## 2022-07-18 DIAGNOSIS — Z7189 Other specified counseling: Secondary | ICD-10-CM | POA: Diagnosis not present

## 2022-07-18 DIAGNOSIS — E1151 Type 2 diabetes mellitus with diabetic peripheral angiopathy without gangrene: Secondary | ICD-10-CM | POA: Diagnosis present

## 2022-07-18 DIAGNOSIS — K922 Gastrointestinal hemorrhage, unspecified: Secondary | ICD-10-CM | POA: Diagnosis not present

## 2022-07-18 DIAGNOSIS — I739 Peripheral vascular disease, unspecified: Secondary | ICD-10-CM | POA: Diagnosis present

## 2022-07-18 DIAGNOSIS — Z803 Family history of malignant neoplasm of breast: Secondary | ICD-10-CM

## 2022-07-18 DIAGNOSIS — D631 Anemia in chronic kidney disease: Secondary | ICD-10-CM | POA: Diagnosis present

## 2022-07-18 DIAGNOSIS — R4701 Aphasia: Secondary | ICD-10-CM | POA: Diagnosis present

## 2022-07-18 DIAGNOSIS — Z85828 Personal history of other malignant neoplasm of skin: Secondary | ICD-10-CM

## 2022-07-18 DIAGNOSIS — Z8249 Family history of ischemic heart disease and other diseases of the circulatory system: Secondary | ICD-10-CM

## 2022-07-18 DIAGNOSIS — E875 Hyperkalemia: Secondary | ICD-10-CM | POA: Diagnosis not present

## 2022-07-18 DIAGNOSIS — I2510 Atherosclerotic heart disease of native coronary artery without angina pectoris: Secondary | ICD-10-CM | POA: Diagnosis present

## 2022-07-18 DIAGNOSIS — J189 Pneumonia, unspecified organism: Secondary | ICD-10-CM | POA: Diagnosis not present

## 2022-07-18 DIAGNOSIS — Z951 Presence of aortocoronary bypass graft: Secondary | ICD-10-CM

## 2022-07-18 DIAGNOSIS — R791 Abnormal coagulation profile: Secondary | ICD-10-CM | POA: Diagnosis not present

## 2022-07-18 LAB — COMPREHENSIVE METABOLIC PANEL
ALT: 11 U/L (ref 0–44)
AST: 18 U/L (ref 15–41)
Albumin: 2.7 g/dL — ABNORMAL LOW (ref 3.5–5.0)
Alkaline Phosphatase: 68 U/L (ref 38–126)
Anion gap: 12 (ref 5–15)
BUN: 51 mg/dL — ABNORMAL HIGH (ref 8–23)
CO2: 28 mmol/L (ref 22–32)
Calcium: 8.6 mg/dL — ABNORMAL LOW (ref 8.9–10.3)
Chloride: 97 mmol/L — ABNORMAL LOW (ref 98–111)
Creatinine, Ser: 6.05 mg/dL — ABNORMAL HIGH (ref 0.61–1.24)
GFR, Estimated: 9 mL/min — ABNORMAL LOW (ref >60)
Glucose, Bld: 130 mg/dL — ABNORMAL HIGH (ref 70–99)
Potassium: 4.1 mmol/L (ref 3.5–5.1)
Sodium: 137 mmol/L (ref 135–145)
Total Bilirubin: 0.5 mg/dL (ref 0.3–1.2)
Total Protein: 6.2 g/dL — ABNORMAL LOW (ref 6.5–8.1)

## 2022-07-18 LAB — CBC WITH DIFFERENTIAL/PLATELET
Abs Immature Granulocytes: 0.02 10*3/uL (ref 0.00–0.07)
Basophils Absolute: 0.1 10*3/uL (ref 0.0–0.1)
Basophils Relative: 1 %
Eosinophils Absolute: 0.7 10*3/uL — ABNORMAL HIGH (ref 0.0–0.5)
Eosinophils Relative: 9 %
HCT: 21.2 % — ABNORMAL LOW (ref 39.0–52.0)
Hemoglobin: 6.6 g/dL — CL (ref 13.0–17.0)
Immature Granulocytes: 0 %
Lymphocytes Relative: 15 %
Lymphs Abs: 1.2 10*3/uL (ref 0.7–4.0)
MCH: 30.8 pg (ref 26.0–34.0)
MCHC: 31.1 g/dL (ref 30.0–36.0)
MCV: 99.1 fL (ref 80.0–100.0)
Monocytes Absolute: 0.7 10*3/uL (ref 0.1–1.0)
Monocytes Relative: 9 %
Neutro Abs: 5.1 10*3/uL (ref 1.7–7.7)
Neutrophils Relative %: 66 %
Platelets: 246 10*3/uL (ref 150–400)
RBC: 2.14 MIL/uL — ABNORMAL LOW (ref 4.22–5.81)
RDW: 16.9 % — ABNORMAL HIGH (ref 11.5–15.5)
WBC: 7.8 10*3/uL (ref 4.0–10.5)
nRBC: 0 % (ref 0.0–0.2)

## 2022-07-18 LAB — IRON AND TIBC
Iron: 45 ug/dL (ref 45–182)
Saturation Ratios: 19 % (ref 17.9–39.5)
TIBC: 238 ug/dL — ABNORMAL LOW (ref 250–450)
UIBC: 193 ug/dL

## 2022-07-18 LAB — HEPATIC FUNCTION PANEL
ALT: 11 U/L (ref 0–44)
AST: 19 U/L (ref 15–41)
Albumin: 2.7 g/dL — ABNORMAL LOW (ref 3.5–5.0)
Alkaline Phosphatase: 59 U/L (ref 38–126)
Bilirubin, Direct: 0.2 mg/dL (ref 0.0–0.2)
Indirect Bilirubin: 0.5 mg/dL (ref 0.3–0.9)
Total Bilirubin: 0.7 mg/dL (ref 0.3–1.2)
Total Protein: 6.3 g/dL — ABNORMAL LOW (ref 6.5–8.1)

## 2022-07-18 LAB — RESP PANEL BY RT-PCR (FLU A&B, COVID) ARPGX2
Influenza A by PCR: NEGATIVE
Influenza B by PCR: NEGATIVE
SARS Coronavirus 2 by RT PCR: NEGATIVE

## 2022-07-18 LAB — I-STAT VENOUS BLOOD GAS, ED
Acid-Base Excess: 5 mmol/L — ABNORMAL HIGH (ref 0.0–2.0)
Bicarbonate: 29.6 mmol/L — ABNORMAL HIGH (ref 20.0–28.0)
Calcium, Ion: 1.11 mmol/L — ABNORMAL LOW (ref 1.15–1.40)
HCT: 21 % — ABNORMAL LOW (ref 39.0–52.0)
Hemoglobin: 7.1 g/dL — ABNORMAL LOW (ref 13.0–17.0)
O2 Saturation: 55 %
Potassium: 4.1 mmol/L (ref 3.5–5.1)
Sodium: 135 mmol/L (ref 135–145)
TCO2: 31 mmol/L (ref 22–32)
pCO2, Ven: 44 mmHg (ref 44–60)
pH, Ven: 7.435 — ABNORMAL HIGH (ref 7.25–7.43)
pO2, Ven: 28 mmHg — CL (ref 32–45)

## 2022-07-18 LAB — TROPONIN I (HIGH SENSITIVITY)
Troponin I (High Sensitivity): 266 ng/L (ref <18)
Troponin I (High Sensitivity): 271 ng/L (ref <18)
Troponin I (High Sensitivity): 256 ng/L (ref <18)

## 2022-07-18 LAB — BRAIN NATRIURETIC PEPTIDE: B Natriuretic Peptide: 2398.5 pg/mL — ABNORMAL HIGH (ref 0.0–100.0)

## 2022-07-18 LAB — HEPATITIS B SURFACE ANTIGEN: Hepatitis B Surface Ag: NONREACTIVE

## 2022-07-18 LAB — HEPATITIS B CORE ANTIBODY, TOTAL: Hep B Core Total Ab: NONREACTIVE

## 2022-07-18 LAB — PROTIME-INR
INR: 2.3 — ABNORMAL HIGH (ref 0.8–1.2)
Prothrombin Time: 25.1 seconds — ABNORMAL HIGH (ref 11.4–15.2)

## 2022-07-18 LAB — CK: Total CK: 106 U/L (ref 49–397)

## 2022-07-18 LAB — POC OCCULT BLOOD, ED: Fecal Occult Bld: NEGATIVE

## 2022-07-18 LAB — CBG MONITORING, ED: Glucose-Capillary: 113 mg/dL — ABNORMAL HIGH (ref 70–99)

## 2022-07-18 LAB — FERRITIN: Ferritin: 185 ng/mL (ref 24–336)

## 2022-07-18 LAB — PREPARE RBC (CROSSMATCH)

## 2022-07-18 LAB — TSH: TSH: 1.676 u[IU]/mL (ref 0.350–4.500)

## 2022-07-18 MED ORDER — ANTICOAGULANT SODIUM CITRATE 4% (200MG/5ML) IV SOLN
5.0000 mL | Status: DC | PRN
  Filled 2022-07-18: qty 5

## 2022-07-18 MED ORDER — PENTAFLUOROPROP-TETRAFLUOROETH EX AERO: 1.0000 | INHALATION_SPRAY | CUTANEOUS | Status: DC | PRN

## 2022-07-18 MED ORDER — INSULIN ASPART 100 UNIT/ML IJ SOLN: 0.0000 [IU] | INTRAMUSCULAR | Status: DC

## 2022-07-18 MED ORDER — HYDROMORPHONE HCL 1 MG/ML IJ SOLN: 0.5000 mg | Freq: Once | INTRAMUSCULAR | Status: DC

## 2022-07-18 MED ORDER — LIDOCAINE-PRILOCAINE 2.5-2.5 % EX CREA: 1.0000 | TOPICAL_CREAM | CUTANEOUS | Status: DC | PRN

## 2022-07-18 MED ORDER — HEPARIN SODIUM (PORCINE) 1000 UNIT/ML DIALYSIS
1000.0000 [IU] | INTRAMUSCULAR | Status: DC | PRN
  Filled 2022-07-18: qty 1

## 2022-07-18 MED ORDER — SODIUM CHLORIDE 0.9% IV SOLUTION: Freq: Once | INTRAVENOUS | Status: DC

## 2022-07-18 MED ORDER — LIDOCAINE HCL (PF) 1 % IJ SOLN: 5.0000 mL | INTRAMUSCULAR | Status: DC | PRN

## 2022-07-18 MED ORDER — CHLORHEXIDINE GLUCONATE CLOTH 2 % EX PADS
6.0000 | MEDICATED_PAD | Freq: Every day | CUTANEOUS | Status: DC
  Administered 2022-07-19 – 2022-07-22 (×4): 6 via TOPICAL

## 2022-07-18 MED ORDER — ALTEPLASE 2 MG IJ SOLR: 2.0000 mg | Freq: Once | INTRAMUSCULAR | Status: DC | PRN

## 2022-07-18 NOTE — Assessment & Plan Note (Signed)
Defer to nephrology to help with fluids management

## 2022-07-18 NOTE — ED Provider Notes (Signed)
[facility] EMERGENCY DEPARTMENT Provider Note  CSN: [id number] Arrival date & time: 07/18/22 1520  Chief Complaint(s) No chief complaint on file.  HPI Allen Cortez is a 71 y.o. male with a history of diabetes, coronary artery disease, ischemic cardiomyopathy, recent diagnosis of end-stage renal disease on Tuesday Thursday Saturday dialysis presenting with generalized fatigue and dyspnea.  He reports shortness of breath and chest tightness.  He reports it is worse over the past few days.  He reports he is using his chronic home oxygen, 3 L.  He denies any chest pain currently.  Denies any fevers or chills, denies cough.  He also reports that he has been going to dialysis as instructed.  He denies any abdominal pain.  Denies any rectal bleeding, melena.  No vomiting or hematemesis.  No fevers or chills.   Past Medical History Past Medical History:  Diagnosis Date   AAA (abdominal aortic aneurysm) (HCC)    Abdominal wall mass of left lower quadrant    Basal cell carcinoma    CAD (coronary artery disease) 2011   moderate   Colon polyp    DDD (degenerative disc disease)    Depression    Diabetes mellitus type II    no meds   GERD (gastroesophageal reflux disease)    Hyperlipidemia    Neuromuscular disorder (HCC)    Osteopenia    PAD (peripheral artery disease) (HCC)    Renal cell carcinoma    Restless leg syndrome    Sleep apnea    used a cpap yr ago-does not snore or use it now.   Patient Active Problem List   Diagnosis Date Noted   Acute on chronic combined systolic and diastolic CHF (congestive heart failure) ([place]) 07/18/2022   Chronic respiratory failure with hypoxia ([place]) 07/18/2022   Primary hypercoagulable state ([place]) 06/29/2022   Long term (current) use of anticoagulants 06/29/2022   NSTEMI (non-ST elevated myocardial infarction) ([place])    Malnutrition of moderate degree 06/23/2022   Acute respiratory failure with hypoxia ([place]) 06/18/2022    Aphasia    Dysarthria    Ischemic cardiomyopathy    Coronary artery disease of native artery of native heart with stable angina pectoris (HCC)    LV (left ventricular) mural thrombus    Acute kidney injury (AKI) with acute tubular necrosis (ATN) ([place]) 06/12/2022   Type 2 diabetes mellitus with hyperlipidemia ([place]) 06/12/2022   Acute on chronic systolic CHF (congestive heart failure) ([place]) 06/12/2022   MDD (major depressive disorder), recurrent episode, severe ([place]) 04/09/2014   Coronary atherosclerosis of native coronary artery 07/15/2013   Tobacco use disorder 07/15/2013   GERD (gastroesophageal reflux disease)    Depression    Basal cell carcinoma    DDD (degenerative disc disease)    Colon polyp    Restless leg syndrome    Osteopenia    PAD (peripheral artery disease) (HCC)    Neuromuscular disorder (HCC)    Sleep apnea    AAA (abdominal aortic aneurysm) ([place])    Abdominal wall mass of left lower quadrant 01/24/2012   Home Medication(s) Prior to Admission medications   Medication Sig Start Date End Date Taking? Authorizing Provider  acetaminophen (TYLENOL) 325 MG tablet Take 2 tablets (650 mg total) by mouth every 4 (four) hours as needed for headache or mild pain. 06/27/22  Yes Arrien, Jimmy Picket, MD  aspirin EC 81 MG tablet Take 1 tablet (81 mg total) by mouth daily. Swallow whole. 07/11/22  Yes Shelva Majestic  A, MD  atorvastatin (LIPITOR) 40 MG tablet Take 40 mg by mouth at bedtime. 05/24/22  Yes [provider]  B Complex-C-Zn-Folic Acid (DIALYVITE 076-AUQJ 15) 0.8 MG TABS Take 1 tablet by mouth 3 (three) times daily as needed (with meals). 07/05/22  Yes [provider]  carvedilol (COREG) 6.25 MG tablet Take 1 tablet (6.25 mg total) by mouth 2 (two) times daily with a meal. 07/14/22  Yes Troy Sine, MD  citalopram (CELEXA) 40 MG tablet Take 40 mg by mouth every evening.   Yes [provider]  clonazePAM (KLONOPIN) 0.5 MG tablet Take 0.5 mg by  mouth at bedtime. 10/03/21  Yes [provider]  furosemide (LASIX) 40 MG tablet Take 1 tablet (40 mg total) by mouth 2 (two) times daily. 07/14/22  Yes Troy Sine, MD  hydrALAZINE (APRESOLINE) 25 MG tablet Take 1 tablet (25 mg total) by mouth every 8 (eight) hours. 07/14/22  Yes Troy Sine, MD  isosorbide mononitrate (IMDUR) 30 MG 24 hr tablet Take 0.5 tablets (15 mg total) by mouth daily. 07/14/22  Yes Troy Sine, MD  lansoprazole (PREVACID) 15 MG capsule Take 15 mg by mouth every morning. 01/24/22  Yes [provider]  multivitamin (RENA-VIT) TABS tablet Take 1 tablet by mouth at bedtime. 06/27/22 07/27/22 Yes Arrien, Jimmy Picket, MD  Nutritional Supplements (NEPRO) LIQD Take 237 mLs by mouth 2 (two) times daily.   Yes [provider]  QUEtiapine (SEROQUEL) 300 MG tablet Take 300 mg by mouth at bedtime. 10/03/17  Yes [provider]  warfarin (COUMADIN) 5 MG tablet Take 1 tablet daily Monday, Wednesday, Friday, Saturday and Sunday and take half tablet daily on Tuesday and Thursday. Patient taking differently: Take 2.5-5 mg by mouth See admin instructions. Take 2.5 mg (1/2 tablet) by mouth daily every day of the week except for Monday's. On Monday's take 5 mg (1 tablet). 06/27/22  Yes Arrien, Jimmy Picket, MD  feeding supplement (ENSURE ENLIVE / ENSURE PLUS) LIQD Take 237 mLs by mouth 2 (two) times daily between meals. Patient not taking: Reported on 07/18/2022 06/27/22 07/27/22  Arrien, Jimmy Picket, MD                                                                                                                                    Past Surgical History Past Surgical History:  Procedure Laterality Date   ABDOMINAL AORTIC ANEURYSM REPAIR     HEMORRHOID SURGERY     HERNIA REPAIR  03/06/12   ventral hernia repair   IR FLUORO GUIDE CV LINE RIGHT  06/20/2022   IR US GUIDE VASC ACCESS RIGHT  06/20/2022   NEPHRECTOMY     left due to renal cell  carcenoma   VENTRAL HERNIA REPAIR  03/06/2012   Procedure: HERNIA REPAIR VENTRAL ADULT;  Surgeon: Joyice Faster. Cornett, MD;  Location: [facility];  Service: General;  Laterality: N/A;  excision of stitch granuloma   Family History Family History  Problem Relation Age of Onset   Diabetes Mother    Aneurysm Mother        in brain   Cancer Father        lung   Depression Father    Cancer Sister        lung, breast   Pancreatitis Brother    Heart disease Sister        MI   Depression Sister     Social History Social History   Tobacco Use   Smoking status: Former    Packs/day: 1.00    Types: Cigarettes    Quit date: 05/09/2022    Years since quitting: 0.1   Smokeless tobacco: Never  Substance Use Topics   Alcohol use: Yes    Comment: glass of wine at night   Drug use: No   Allergies Dilaudid [hydromorphone hcl], Mirapex [pramipexole dihydrochloride], Prednisone, and Gabapentin  Review of Systems Review of Systems  All other systems reviewed and are negative.   Physical Exam Vital Signs  I have reviewed the triage vital signs BP (!) 141/90   Pulse 88   Temp 98.2 F (36.8 C) (Oral)   Resp 18   SpO2 98%  Physical Exam Vitals and nursing note reviewed.  Constitutional:      General: He is not in acute distress.    Appearance: Normal appearance.  HENT:     Mouth/Throat:     Mouth: Mucous membranes are moist.  Eyes:     Conjunctiva/sclera: Conjunctivae normal.  Cardiovascular:     Rate and Rhythm: Normal rate and regular rhythm.  Pulmonary:     Effort: No respiratory distress.     Breath sounds: Rales (bibasilar) present.     Comments: Mild increased work of breathing Abdominal:     General: Abdomen is flat.     Palpations: Abdomen is soft.     Tenderness: There is no abdominal tenderness.  Genitourinary:    Comments: Chaperoned by RN, brown stool, no melena Musculoskeletal:     Right lower leg: Edema present.     Left lower leg: Edema  present.  Skin:    General: Skin is warm and dry.     Capillary Refill: Capillary refill takes less than 2 seconds.  Neurological:     Mental Status: He is alert and oriented to person, place, and time. Mental status is at baseline.  Psychiatric:        Mood and Affect: Mood normal.        Behavior: Behavior normal.     ED Results and Treatments Labs (all labs ordered are listed, but only abnormal results are displayed) Labs Reviewed  COMPREHENSIVE METABOLIC PANEL - Abnormal; Notable for the following components:      Result Value   Chloride 97 (*)    Glucose, Bld 130 (*)    BUN 51 (*)    Creatinine, Ser 6.05 (*)    Calcium 8.6 (*)    Total Protein 6.2 (*)    Albumin 2.7 (*)    GFR, Estimated 9 (*)    All other components within normal limits  CBC WITH DIFFERENTIAL/PLATELET - Abnormal; Notable for the following components:   RBC 2.14 (*)    Hemoglobin 6.6 (*)    HCT 21.2 (*)    RDW 16.9 (*)    Eosinophils Absolute 0.7 (*)    All other components within normal limits  BRAIN NATRIURETIC PEPTIDE -  Abnormal; Notable for the following components:   B Natriuretic Peptide 2,398.5 (*)    All other components within normal limits  PROTIME-INR - Abnormal; Notable for the following components:   Prothrombin Time 25.1 (*)    INR 2.3 (*)    All other components within normal limits  CBG MONITORING, ED - Abnormal; Notable for the following components:   Glucose-Capillary 113 (*)    All other components within normal limits  I-STAT VENOUS BLOOD GAS, ED - Abnormal; Notable for the following components:   pH, Ven 7.435 (*)    pO2, Ven 28 (*)    Bicarbonate 29.6 (*)    Acid-Base Excess 5.0 (*)    Calcium, Ion 1.11 (*)    HCT 21.0 (*)    Hemoglobin 7.1 (*)    All other components within normal limits  TROPONIN I (HIGH SENSITIVITY) - Abnormal; Notable for the following components:   Troponin I (High Sensitivity) 266 (*)    All other components within normal limits  TROPONIN I  (HIGH SENSITIVITY) - Abnormal; Notable for the following components:   Troponin I (High Sensitivity) 271 (*)    All other components within normal limits  RESP PANEL BY RT-PCR (FLU A&B, COVID) ARPGX2  BLOOD GAS, VENOUS  CK  HEPATIC FUNCTION PANEL  PREALBUMIN  TSH  HEPATITIS B SURFACE ANTIGEN  HEPATITIS B SURFACE ANTIBODY,QUALITATIVE  HEPATITIS B SURFACE ANTIBODY, QUANTITATIVE  HEPATITIS B CORE ANTIBODY, TOTAL  HEPATITIS C ANTIBODY  FERRITIN  IRON AND TIBC  PHOSPHORUS  PARATHYROID HORMONE, INTACT (NO CA)  POC OCCULT BLOOD, ED  TYPE AND SCREEN  PREPARE RBC (CROSSMATCH)  TROPONIN I (HIGH SENSITIVITY)                                                                                                                          Radiology DG Chest 2 View  Result Date: 07/18/2022 CLINICAL DATA:  Shortness of breath. Bilateral leg swelling for several days. EXAM: CHEST - 2 VIEW COMPARISON:  07/08/2022 FINDINGS: Mildly enlarged cardiac silhouette with an interval increase in size. Interval increased prominence of the interstitial markings and increased patchy densities in both lungs. Small bilateral pleural effusions. Stable right jugular double-lumen catheter with its tip in the upper right atrium. Unremarkable bones. IMPRESSION: 1. Worsening changes of congestive heart failure. 2. Small bilateral pleural effusions. 3. Mild cardiomegaly. Electronically Signed   By: Claudie Revering M.D.   On: 07/18/2022 17:45    Pertinent labs & imaging results that were available during my care of the patient were reviewed by me and considered in my medical decision making (see MDM for details).  Medications Ordered in ED Medications  insulin aspart (novoLOG) injection 0-6 Units ( Subcutaneous Not Given 07/18/22 2050)  Chlorhexidine Gluconate Cloth 2 % PADS 6 each (has no administration in time range)  0.9 %  sodium chloride infusion (Manually program via Guardrails IV Fluids) (has no administration in time range)   HYDROmorphone (DILAUDID) injection 0.5 mg (  has no administration in time range)                                                                                                                                     Procedures .Critical Care  Performed by: Cristie Hem, MD Authorized by: Cristie Hem, MD   Critical care provider statement:    Critical care time (minutes):  30   Critical care end time:  07/18/2022 9:14 PM   Critical care was necessary to treat or prevent imminent or life-threatening deterioration of the following conditions:  Cardiac failure, circulatory failure, renal failure and respiratory failure   Critical care was time spent personally by me on the following activities:  Development of treatment plan with patient or surrogate, discussions with consultants, examination of patient, ordering and review of laboratory studies, ordering and review of radiographic studies, ordering and performing treatments and interventions, pulse oximetry, re-evaluation of patient's condition and review of old charts   Care discussed with: admitting provider     (including critical care time)  Medical Decision Making / ED Course   MDM:  71 year old male presenting to the emergency department with shortness of breath.  Patient chronically ill-appearing, no acute distress, does have increased work of breathing and crackles on exam.  Not hypoxic on home oxygen  suspect acute CHF complicated by end-stage renal disease and hypervolemia.  Troponin very elevated at 270, slightly higher than time of discharge.  No active chest pain, EKG appears similar to prior, suspect most likely cause is demand, discussed with Dr. Jannifer Franklin on-call cardiology who agrees but will evaluate patient given significant elevation, patient also with end-stage renal disease which will likely cause increased troponin level.  No evidence of pneumonia on chest x-ray, no sign of pneumothorax, doubt dissection with  stable cardiomediastinal silhouette.  Discussed presentation with Dr. Augustin Coupe of nephrology who agrees that patient needs dialysis.  Labs notable for new hemoglobin drop to 6.6, rectal exam negative for melena or acute bleeding, fecal occult blood test negative.  Could be delusional due to hypervolemia.  Discussed presentation with the admitting doctor who has accepted the patient for admission.     Clinical Course as of 07/18/22 2124  Mon Jul 18, 2022  1952 Discussed with Dr. Augustin Coupe, who will add patient to schedule first chair in AM.  [WS]    Clinical Course User Index [WS] Cristie Hem, MD     Additional history obtained: -Additional history obtained from family and ems -External records from outside source obtained and reviewed including: Chart review including previous notes, labs, imaging, consultation notes including recent hospitalization 06/12/22    Lab Tests: -I ordered, reviewed, and interpreted labs.   The pertinent results include:   Labs Reviewed  COMPREHENSIVE METABOLIC PANEL - Abnormal; Notable for the following components:      Result Value   Chloride 97 (*)    Glucose, Bld 130 (*)  BUN 51 (*)    Creatinine, Ser 6.05 (*)    Calcium 8.6 (*)    Total Protein 6.2 (*)    Albumin 2.7 (*)    GFR, Estimated 9 (*)    All other components within normal limits  CBC WITH DIFFERENTIAL/PLATELET - Abnormal; Notable for the following components:   RBC 2.14 (*)    Hemoglobin 6.6 (*)    HCT 21.2 (*)    RDW 16.9 (*)    Eosinophils Absolute 0.7 (*)    All other components within normal limits  BRAIN NATRIURETIC PEPTIDE - Abnormal; Notable for the following components:   B Natriuretic Peptide 2,398.5 (*)    All other components within normal limits  PROTIME-INR - Abnormal; Notable for the following components:   Prothrombin Time 25.1 (*)    INR 2.3 (*)    All other components within normal limits  CBG MONITORING, ED - Abnormal; Notable for the following components:    Glucose-Capillary 113 (*)    All other components within normal limits  I-STAT VENOUS BLOOD GAS, ED - Abnormal; Notable for the following components:   pH, Ven 7.435 (*)    pO2, Ven 28 (*)    Bicarbonate 29.6 (*)    Acid-Base Excess 5.0 (*)    Calcium, Ion 1.11 (*)    HCT 21.0 (*)    Hemoglobin 7.1 (*)    All other components within normal limits  TROPONIN I (HIGH SENSITIVITY) - Abnormal; Notable for the following components:   Troponin I (High Sensitivity) 266 (*)    All other components within normal limits  TROPONIN I (HIGH SENSITIVITY) - Abnormal; Notable for the following components:   Troponin I (High Sensitivity) 271 (*)    All other components within normal limits  RESP PANEL BY RT-PCR (FLU A&B, COVID) ARPGX2  BLOOD GAS, VENOUS  CK  HEPATIC FUNCTION PANEL  PREALBUMIN  TSH  HEPATITIS B SURFACE ANTIGEN  HEPATITIS B SURFACE ANTIBODY,QUALITATIVE  HEPATITIS B SURFACE ANTIBODY, QUANTITATIVE  HEPATITIS B CORE ANTIBODY, TOTAL  HEPATITIS C ANTIBODY  FERRITIN  IRON AND TIBC  PHOSPHORUS  PARATHYROID HORMONE, INTACT (NO CA)  POC OCCULT BLOOD, ED  TYPE AND SCREEN  PREPARE RBC (CROSSMATCH)  TROPONIN I (HIGH SENSITIVITY)    Notable for elevated troponin, BNP, worsening anemia  EKG   EKG Interpretation  Date/Time:  Monday July 18 2022 16:39:02 EDT Ventricular Rate:  85 PR Interval:  144 QRS Duration: 98 QT Interval:  418 QTC Calculation: 497 R Axis:   -29 Text Interpretation: Normal sinus rhythm Possible Left atrial enlargement Minimal voltage criteria for LVH, may be normal variant ( Cornell product ) Anterior infarct , age undetermined Abnormal ECG When compared with ECG of 18-Jul-2022 16:09, PREVIOUS ECG IS PRESENT Confirmed by Elnora Morrison [phone]) on 07/18/2022 5:01:58 PM         Imaging Studies ordered: I ordered imaging studies including CXR On my interpretation imaging demonstrates pulm edema I independently visualized and interpreted imaging. I agree  with the radiologist interpretation   Medicines ordered and prescription drug management: Meds ordered this encounter  Medications   insulin aspart (novoLOG) injection 0-6 Units    Order Specific Question:   Correction coverage:    Answer:   Very Sensitive (ESRD/Dialysis)    Order Specific Question:   CBG < 70:    Answer:   Implement Hypoglycemia Standing Orders and refer to Hypoglycemia Standing Orders sidebar report    Order Specific Question:   CBG 70 - 120:  Answer:   0 units    Order Specific Question:   CBG 121 - 150:    Answer:   0 units    Order Specific Question:   CBG 151 - 200:    Answer:   1 unit    Order Specific Question:   CBG 201-250:    Answer:   2 units    Order Specific Question:   CBG 251-300:    Answer:   3 units    Order Specific Question:   CBG 301-350:    Answer:   4 units    Order Specific Question:   CBG 351-400:    Answer:   5 units    Order Specific Question:   CBG > 400    Answer:   Give 6 units and call MD   Chlorhexidine Gluconate Cloth 2 % PADS 6 each   0.9 %  sodium chloride infusion (Manually program via Guardrails IV Fluids)   HYDROmorphone (DILAUDID) injection 0.5 mg    Please double check that he's received it. According to spouse and son bedside he received dilaudid last hospitalization    -I have reviewed the patients home medicines and have made adjustments as needed   Consultations Obtained: I requested consultation with the nephrologist, cardiologist,  and discussed lab and imaging findings as well as pertinent plan - they recommend: dialysis   Cardiac Monitoring: The patient was maintained on a cardiac monitor.  I personally viewed and interpreted the cardiac monitored which showed an underlying rhythm of: NSR  Social Determinants of Health:  Diagnosis or treatment significantly limited by social determinants of health: former smoker   Reevaluation: After the interventions noted above, I reevaluated the patient and found that  they have stayed the same  Co morbidities that complicate the patient evaluation  Past Medical History:  Diagnosis Date   AAA (abdominal aortic aneurysm) (HCC)    Abdominal wall mass of left lower quadrant    Basal cell carcinoma    CAD (coronary artery disease) 2011   moderate   Colon polyp    DDD (degenerative disc disease)    Depression    Diabetes mellitus type II    no meds   GERD (gastroesophageal reflux disease)    Hyperlipidemia    Neuromuscular disorder (HCC)    Osteopenia    PAD (peripheral artery disease) (HCC)    Renal cell carcinoma    Restless leg syndrome    Sleep apnea    used a cpap yr ago-does not snore or use it now.      Dispostion: Disposition decision including need for hospitalization was considered, and patient admitted to the hospital.    Final Clinical Impression(s) / ED Diagnoses Final diagnoses:  Acute on chronic systolic congestive heart failure (HCC)  Ischemic cardiomyopathy  Elevated troponin I level  Acute anemia     This chart was dictated using voice recognition software.  Despite best efforts to proofread,  errors can occur which can change the documentation meaning.    Cristie Hem, MD 07/18/22 2124

## 2022-07-18 NOTE — Assessment & Plan Note (Signed)
Will need further follow-up For vascular surgery

## 2022-07-18 NOTE — Consult Note (Addendum)
Reason for Consult: Volume overload Referring Physician:  Theodis Blaze, PA  Chief Complaint: Shortness of breath  HD Orders [place] 4hr TTS EDW presumably 70kg 3/2.25, NO UFP Heparin 2K Units Mircera 50 q2weeks (last given 10/14)  Assessment/Plan: AKI on CKD 3b - b/l creatinine 1.6 prior to recent heart cath.   2 days post cath (creat 2.2) and from 9/5 in ED here (creat 3.3) are not entirely consistent w/ a typical post-contrast ATN / AKI pattern however cholesterol embolic injury is possible. CES not likely to improve but CIN/ATN may.  Prior renal US c/w CKD, simple cysts. [place] placed on and 1st HD on 9/18. - Plan on dialyzing emergently tonight; he does develop severe leg pain with dialysis. Will write for Dilaudid 0.5 before HD today and UF profile #2. At outpt HD he actually takes Oxycodone prescribed by cardiology for the leg pain per spouse and son who were bedside. - Seen on HD at 1014PM 4K bath, 3hr treatment -> decrease to 2L UF and will dialyze again 2nd shift tomorrow mainly for UF. RIJ TC  HTN - home regimen hydralazine, coreg and imdur; BPs are adequate in the ED. On Lasix 40 BID at home but not voiding much; strict I&O's. CAD - sp LHC on 8/22 at [facility] as above and seen by CTS. MRI last Friday and may need CABG with total occlusion in the LAD.  He may not recover renal function and may need to proceed with CABG with the designation of ESRD A/C systolic CHF - echo with EF 30-35%.  Meds limited by kidney function.     Acute hypoxic resp failure -overall stable, O2 as needed Anemia-  would transfuse 1 unit with dialysis, recheck iron stores + ESA Bones-  will check a phos and PTH  HPI: Allen Cortez is an 71 y.o. 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 Patient placed at [facility] TTS with last treatment on Saturday after receiving a cardiac MRI on Friday. He is presenting with worsening shortness of breath, orthopnea, poor exercise tolerance; he denies fever, chills, cough, sore throat. He also denies NSAID use, excessive fluid intake (actually not eating or drinking very much); he has decreased urine output and does not feel full after voiding. He also denies tarry stool, blood in the stool or vomiting blood.   In the ED his BUN/Cr was 51/6.05 with a potassium of 4.1 and HCO3 of 28, Hb 6.6 with a BNP of 2400.  CXR showed Interval increased prominence of the interstitial markings and increased patchy densities in both lungs. Small bilateral pleural effusions. BP was 122-150/77-90 with a HR in the 80-90's range.    ROS Pertinent items are noted in HPI.  Chemistry and CBC: Creatinine, Ser  Date/Time Value Ref Range Status  07/18/2022 04:53 PM 6.05 (H) 0.61 - 1.24 mg/dL Final  07/02/2022 06:20 PM 3.74 (H) 0.61 - 1.24 mg/dL Final  06/27/2022 06:11 AM 6.80 (H) 0.61 - 1.24 mg/dL Final  06/26/2022 10:35 AM 5.60 (H) 0.61 - 1.24 mg/dL Final  06/25/2022 03:47 AM 7.65 (H) 0.61 - 1.24 mg/dL Final  06/24/2022 04:11 AM 6.11 (H) 0.61 - 1.24 mg/dL Final  06/23/2022 06:51 AM 8.65 (H) 0.61 - 1.24 mg/dL Final  06/22/2022 05:21 AM 8.14 (H) 0.61 - 1.24 mg/dL Final  06/21/2022 03:38 AM 6.91 (H) 0.61 - 1.24 mg/dL Final  06/20/2022 10:05 AM 9.26 (H) 0.61 - 1.24 mg/dL Final  06/19/2022 03:01 AM 8.45 (H) 0.61 - 1.24 mg/dL Final  06/18/2022 03:46 AM 7.50 (H) 0.61 - 1.24 mg/dL Final  06/17/2022 04:05 AM 7.37 (H) 0.61 - 1.24 mg/dL Final  06/16/2022 03:20 AM 6.74 (H) 0.61 - 1.24 mg/dL Final  06/15/2022 05:39 PM 6.64 (H) 0.61 - 1.24 mg/dL Final  06/15/2022 04:55 AM 6.41 (H) 0.61 - 1.24  mg/dL Final  06/14/2022 04:57 AM 5.98 (H) 0.61 - 1.24 mg/dL Final  06/13/2022 02:00 AM 5.60 (H) 0.61 - 1.24 mg/dL Final  06/12/2022 03:26 AM 5.07 (H) 0.61 - 1.24 mg/dL Final  06/07/2022 05:33 PM 3.34 (H) 0.61 - 1.24 mg/dL Final  04/09/2014 03:37 PM 1.15 0.50 - 1.35 mg/dL Final  03/02/2012 08:55 AM 1.19 0.50 - 1.35 mg/dL Final  10/25/2010 04:50 AM 1.44 0.4 - 1.5 mg/dL Final  10/23/2010 05:35 AM 1.45 0.4 - 1.5 mg/dL Final  10/22/2010 05:00 AM 1.54 (H) 0.4 - 1.5 mg/dL Final  10/21/2010 11:39 AM 1.50 0.4 - 1.5 mg/dL Final  10/15/2010 09:10 AM 1.50 0.4 - 1.5 mg/dL Final  12/29/2009 04:33 AM 1.12 0.4 - 1.5 mg/dL Final  12/27/2009 04:30 AM 1.19 0.4 - 1.5 mg/dL Final  12/26/2009 04:02 AM 1.30 0.4 - 1.5 mg/dL Final  12/25/2009 11:37 AM 1.13 0.4 - 1.5 mg/dL Final  12/23/2009 08:56 AM 1.28 0.4 - 1.5 mg/dL Final   Recent Labs  Lab 07/18/22 1653  NA 137  K 4.1  CL 97*  CO2 28  GLUCOSE 130*  BUN 51*  CREATININE 6.05*  CALCIUM 8.6*   Recent Labs  Lab 07/18/22 1653  WBC 7.8  NEUTROABS 5.1  HGB 6.6*  HCT 21.2*  MCV 99.1  PLT 246   Liver Function Tests: Recent Labs  Lab 07/18/22 1653  AST 18  ALT 11  ALKPHOS 68  BILITOT 0.5  PROT 6.2*  ALBUMIN 2.7*   No results for input(s): "LIPASE", "AMYLASE" in the last 168 hours. No results for input(s): "AMMONIA" in the last 168 hours. Cardiac Enzymes: No results for input(s): "CKTOTAL", "CKMB", "CKMBINDEX", "TROPONINI" in the last 168 hours. Iron Studies: No results for input(s): "IRON", "TIBC", "TRANSFERRIN", "FERRITIN" in the last 72 hours. PT/INR: '@LABRCNTIP'$ (inr:5)  Xrays/Other Studies: ) Results for orders placed or performed during the hospital encounter of 07/18/22 (from the past 48 hour(s))  Resp Panel by RT-PCR (Flu A&B, Covid) Anterior Nasal Swab     Status: None   Collection Time: 07/18/22  4:28 PM   Specimen: Anterior Nasal Swab  Result Value Ref Range   SARS Coronavirus 2 by RT PCR NEGATIVE NEGATIVE    Comment:  (NOTE) SARS-CoV-2 target nucleic acids are NOT DETECTED.  The SARS-CoV-2 RNA is generally detectable in upper respiratory specimens during the acute phase of infection. The lowest concentration of SARS-CoV-2 viral copies this assay can detect is 138 copies/mL. A negative result does not preclude SARS-Cov-2 infection and should not be used as the sole basis for treatment or other patient management decisions. A negative result may occur with  improper specimen collection/handling, submission of specimen other than nasopharyngeal swab, presence of viral mutation(s) within the areas targeted by this assay, and inadequate number of viral copies(<138 copies/mL). A negative  result must be combined with clinical observations, patient history, and epidemiological information. The expected result is Negative.  Fact Sheet for Patients:  EntrepreneurPulse.com.au  Fact Sheet for Healthcare Providers:  IncredibleEmployment.be  This test is no t yet approved or cleared by the [place] FDA and  has been authorized for detection and/or diagnosis of SARS-CoV-2 by FDA under an Emergency Use Authorization (EUA). This EUA will remain  in effect (meaning this test can be used) for the duration of the COVID-19 declaration under Section 564(b)(1) of the Act, 21 U.S.C.section 360bbb-3(b)(1), unless the authorization is terminated  or revoked sooner.       Influenza A by PCR NEGATIVE NEGATIVE   Influenza B by PCR NEGATIVE NEGATIVE    Comment: (NOTE) The Xpert Xpress SARS-CoV-2/FLU/RSV plus assay is intended as an aid in the diagnosis of influenza from Nasopharyngeal swab specimens and should not be used as a sole basis for treatment. Nasal washings and aspirates are unacceptable for Xpert Xpress SARS-CoV-2/FLU/RSV testing.  Fact Sheet for Patients: EntrepreneurPulse.com.au  Fact Sheet for Healthcare  Providers: IncredibleEmployment.be  This test is not yet approved or cleared by the [place] FDA and has been authorized for detection and/or diagnosis of SARS-CoV-2 by FDA under an Emergency Use Authorization (EUA). This EUA will remain in effect (meaning this test can be used) for the duration of the COVID-19 declaration under Section 564(b)(1) of the Act, 21 U.S.C. section 360bbb-3(b)(1), unless the authorization is terminated or revoked.  Performed at [facility], [place] [address] [place], [place] [address]   Comprehensive metabolic panel     Status: Abnormal   Collection Time: 07/18/22  4:53 PM  Result Value Ref Range   Sodium 137 135 - 145 mmol/L   Potassium 4.1 3.5 - 5.1 mmol/L   Chloride 97 (L) 98 - 111 mmol/L   CO2 28 22 - 32 mmol/L   Glucose, Bld 130 (H) 70 - 99 mg/dL    Comment: Glucose reference range applies only to samples taken after fasting for at least 8 hours.   BUN 51 (H) 8 - 23 mg/dL   Creatinine, Ser 6.05 (H) 0.61 - 1.24 mg/dL   Calcium 8.6 (L) 8.9 - 10.3 mg/dL   Total Protein 6.2 (L) 6.5 - 8.1 g/dL   Albumin 2.7 (L) 3.5 - 5.0 g/dL   AST 18 15 - 41 U/L   ALT 11 0 - 44 U/L   Alkaline Phosphatase 68 38 - 126 U/L   Total Bilirubin 0.5 0.3 - 1.2 mg/dL   GFR, Estimated 9 (L) >60 mL/min    Comment: (NOTE) Calculated using the CKD-EPI Creatinine Equation (2021)    Anion gap 12 5 - 15    Comment: Performed at [facility], [place] [address], [place], [place] [address]  Troponin I (High Sensitivity)     Status: Abnormal   Collection Time: 07/18/22  4:53 PM  Result Value Ref Range   Troponin I (High Sensitivity) 266 (HH) <18 ng/L    Comment: CRITICAL RESULT CALLED TO, READ BACK BY AND VERIFIED WITH C. ROWE RN 07/18/22 '@1815'$  BY J. WHITE (NOTE) Elevated high sensitivity troponin I (hsTnI) values and significant  changes across serial measurements may suggest ACS but many other  chronic and acute conditions are known to elevate  hsTnI results.  Refer to the "Links" section for chest pain algorithms and additional  guidance. Performed at [facility], [place] [address], [place], [place] [address]   CBC with  Differential     Status: Abnormal   Collection Time: 07/18/22  4:53 PM  Result Value Ref Range   WBC 7.8 4.0 - 10.5 K/uL   RBC 2.14 (L) 4.22 - 5.81 MIL/uL   Hemoglobin 6.6 (LL) 13.0 - 17.0 g/dL    Comment: REPEATED TO VERIFY THIS CRITICAL RESULT HAS VERIFIED AND BEEN CALLED TO C.ROWE,RN BY JOHN VANG ON 10 16 2023 AT 8144, AND HAS BEEN READ BACK.     HCT 21.2 (L) 39.0 - 52.0 %   MCV 99.1 80.0 - 100.0 fL   MCH 30.8 26.0 - 34.0 pg   MCHC 31.1 30.0 - 36.0 g/dL   RDW 16.9 (H) 11.5 - 15.5 %   Platelets 246 150 - 400 K/uL   nRBC 0.0 0.0 - 0.2 %   Neutrophils Relative % 66 %   Neutro Abs 5.1 1.7 - 7.7 K/uL   Lymphocytes Relative 15 %   Lymphs Abs 1.2 0.7 - 4.0 K/uL   Monocytes Relative 9 %   Monocytes Absolute 0.7 0.1 - 1.0 K/uL   Eosinophils Relative 9 %   Eosinophils Absolute 0.7 (H) 0.0 - 0.5 K/uL   Basophils Relative 1 %   Basophils Absolute 0.1 0.0 - 0.1 K/uL   Immature Granulocytes 0 %   Abs Immature Granulocytes 0.02 0.00 - 0.07 K/uL    Comment: Performed at [facility] [address] [place], [place] [address]  Brain natriuretic peptide     Status: Abnormal   Collection Time: 07/18/22  4:53 PM  Result Value Ref Range   B Natriuretic Peptide 2,398.5 (H) 0.0 - 100.0 pg/mL    Comment: Performed at [facility] [address], [place], [place] [address]  Troponin I (High Sensitivity)     Status: Abnormal   Collection Time: 07/18/22  6:45 PM  Result Value Ref Range   Troponin I (High Sensitivity) 271 (HH) <18 ng/L    Comment: CRITICAL VALUE NOTED. VALUE IS CONSISTENT WITH PREVIOUSLY REPORTED/CALLED VALUE (NOTE) Elevated high sensitivity troponin I (hsTnI) values and significant  changes across serial measurements may suggest ACS but many other  chronic and acute conditions are  known to elevate hsTnI results.  Refer to the "Links" section for chest pain algorithms and additional  guidance. Performed at [facility], [place] [address], [place],  [address]   POC occult blood, ED     Status: None   Collection Time: 07/18/22  7:36 PM  Result Value Ref Range   Fecal Occult Bld NEGATIVE NEGATIVE   DG Chest 2 View  Result Date: 07/18/2022 CLINICAL DATA:  Shortness of breath. Bilateral leg swelling for several days. EXAM: CHEST - 2 VIEW COMPARISON:  07/08/2022 FINDINGS: Mildly enlarged cardiac silhouette with an interval increase in size. Interval increased prominence of the interstitial markings and increased patchy densities in both lungs. Small bilateral pleural effusions. Stable right jugular double-lumen catheter with its tip in the upper right atrium. Unremarkable bones. IMPRESSION: 1. Worsening changes of congestive heart failure. 2. Small bilateral pleural effusions. 3. Mild cardiomegaly. Electronically Signed   By: Claudie Revering M.D.   On: 07/18/2022 17:45    PMH:   Past Medical History:  Diagnosis Date   AAA (abdominal aortic aneurysm) ([place])    Abdominal wall mass of left lower quadrant    Basal cell carcinoma    CAD (coronary artery disease) 2011   moderate   Colon polyp    DDD (degenerative disc disease)  Depression    Diabetes mellitus type II    no meds   GERD (gastroesophageal reflux disease)    Hyperlipidemia    Neuromuscular disorder (H[facility])    Osteopenia    PAD (peripheral artery disease) ([facility])    Renal cell carcinoma    Restless leg syndrome    Sleep apnea    used a cpap yr ago-does not snore or use it now.    PSH:   Past Surgical History:  Procedure Laterality Date   ABDOMINAL AORTIC ANEURYSM REPAIR     HEMORRHOID SURGERY     HERNIA REPAIR  03/06/12   ventral hernia repair   IR FLUORO GUIDE CV LINE RIGHT  06/20/2022   IR US GUIDE VASC ACCESS RIGHT  06/20/2022   NEPHRECTOMY     left due to renal cell carcenoma   VENTRAL  HERNIA REPAIR  03/06/2012   Procedure: HERNIA REPAIR VENTRAL ADULT;  Surgeon: Joyice Faster. Cornett, MD;  Location: [place];  Service: General;  Laterality: N/A;  excision of stitch granuloma    Allergies:  Allergies  Allergen Reactions   Dilaudid [Hydromorphone Hcl] Other (See Comments)    hallucinations   Mirapex [Pramipexole Dihydrochloride] Other (See Comments)    Leg pain   Prednisone Other (See Comments)    irritability   Gabapentin     Other reaction(s): Tremor    Medications:   Prior to Admission medications   Medication Sig Start Date End Date Taking? Authorizing Provider  acetaminophen (TYLENOL) 325 MG tablet Take 2 tablets (650 mg total) by mouth every 4 (four) hours as needed for headache or mild pain. 06/27/22   Arrien, Jimmy Picket, MD  aspirin EC 81 MG tablet Take 1 tablet (81 mg total) by mouth daily. Swallow whole. 07/11/22   Troy Sine, MD  atorvastatin (LIPITOR) 40 MG tablet Take 40 mg by mouth at bedtime. 05/24/22   [provider]  carvedilol (COREG) 6.25 MG tablet Take 1 tablet (6.25 mg total) by mouth 2 (two) times daily with a meal. 07/14/22   Troy Sine, MD  citalopram (CELEXA) 40 MG tablet Take 40 mg by mouth every evening.    [provider]  clonazePAM (KLONOPIN) 0.5 MG tablet Take 0.5 mg by mouth at bedtime. 10/03/21   [provider]  feeding supplement (ENSURE ENLIVE / ENSURE PLUS) LIQD Take 237 mLs by mouth 2 (two) times daily between meals. 06/27/22 07/27/22  Arrien, Jimmy Picket, MD  furosemide (LASIX) 40 MG tablet Take 1 tablet (40 mg total) by mouth 2 (two) times daily. 07/14/22   Troy Sine, MD  hydrALAZINE (APRESOLINE) 25 MG tablet Take 1 tablet (25 mg total) by mouth every 8 (eight) hours. 07/14/22   Troy Sine, MD  isosorbide mononitrate (IMDUR) 30 MG 24 hr tablet Take 0.5 tablets (15 mg total) by mouth daily. 07/14/22   Troy Sine, MD  lansoprazole (PREVACID) 15 MG capsule Take 15 mg  by mouth every morning. 01/24/22   [provider]  multivitamin (RENA-VIT) TABS tablet Take 1 tablet by mouth at bedtime. 06/27/22 07/27/22  Arrien, Jimmy Picket, MD  QUEtiapine (SEROQUEL) 300 MG tablet Take 300 mg by mouth at bedtime. 10/03/17   [provider]  tretinoin (RETIN-A) 0.1 % cream as needed for irritation. 02/11/22   [provider]  warfarin (COUMADIN) 5 MG tablet Take 1 tablet daily Monday, Wednesday, Friday, Saturday and Sunday and take half tablet daily on Tuesday and Thursday. 06/27/22  Arrien, Jimmy Picket, MD    Discontinued Meds:  There are no discontinued medications.  Social History:  reports that he quit smoking about 2 months ago. His smoking use included cigarettes. He smoked an average of 1 pack per day. He has never used smokeless tobacco. He reports current alcohol use. He reports that he does not use drugs.  Family History:   Family History  Problem Relation Age of Onset   Diabetes Mother    Aneurysm Mother        in brain   Cancer Father        lung   Depression Father    Cancer Sister        lung, breast   Pancreatitis Brother    Heart disease Sister        MI   Depression Sister     Blood pressure (!) 145/92, pulse 93, temperature 98.2 F (36.8 C), temperature source Oral, resp. rate (!) 21, SpO2 98 %. General: In bed with HOB elevated significantly -  chronically ill appearing Heart: Normal rate, no rub Lungs: rales b/l up to 1/3  Abdomen: soft, nondistended Extremities: tr edema, warm and well perfused Neuro: Minimal tremor, conversant and oriented Back: no flank tenderness GU: no foley and no suprapubic TTP Access: RIJ TC       Dwana Melena, MD 07/18/2022, 7:53 PM

## 2022-07-18 NOTE — H&P (Signed)
Allen Cortez ZOX:[id number] DOB: [birth year] DOA: 07/18/2022     PCP: Gerome Sam, MD   Outpatient Specialists:   CARDS: Dr.Kelly    Patient arrived to ER on 07/18/22 at 1520 Referred by Attending Cristie Hem, MD   Patient coming from:    home Lives  With family    Chief Complaint: Shortness of breath chest pain   HPI: Allen Cortez is a 71 y.o. male with medical history significant of end-stage renal disease on hemodialysis Tuesday Thursday Saturday, CAD, systolic CHF, DM2, GERD, OSA, aphasia, malnutrition Iron deficiency anemia  Presented with   Patient comes in from home for shortness of breath last hemodialysis was on Saturday has been having lower extremity swelling started for the past 2 days patient is on 3 L of oxygen at baseline have not had any chest pain no nausea no vomiting Has known history of CAD had a recent admission for cardiac catheterization that showed 80% blockage in 1 side and 100% in the artery. Reports having chest tightness unable to lay flat because he is so short of breath no fevers no chills Patient has been dialysis dependent since his cardiac catheterization currently getting it on Tuesday Thursday Saturday. He is still not making much urine  Quit smoking recently       Initial COVID TEST  NEGATIVE   Lab Results  Component Value Date   [place] NEGATIVE 07/18/2022   [place] NEGATIVE 06/12/2022     Regarding pertinent Chronic problems:     Hyperlipidemia -  on statins Lipitor (atorvastatin)  Lipid Panel     Component Value Date/Time   CHOL 113 06/15/2022 0455   TRIG 137 06/15/2022 0455   HDL 26 (L) 06/15/2022 0455   CHOLHDL 4.3 06/15/2022 0455   VLDL 27 06/15/2022 0455   LDLCALC 60 06/15/2022 0455     HTN on hydralazine, coreg, imdur   chronic CHF diastolic/systolic/ combined - last echo September 2023 EF 30-35%  akinesis of the left ventricular, apical septal wall, inferior wall, anterior  wall and lateral wall, akinesis of the left ventricular, entire apical segment. Severe hypokinesis of the left ventricular basal mid anterior wall. RV systolic function preserved. RVSP 45.5    LV thrombus, patient was placed on warfarin INR target 2 to 3.      CAD  - On Aspirin, statin, betablocker, Plavix                 - followed by cardiology                - last cardiac cath August 2023 At some point will need CABG        DM 2 -  Lab Results  Component Value Date   HGBA1C 6.1 (H) 06/13/2022    diet controlled     End-stage renal disease on hemodialysis Tuesday Thursday Saturday Lab Results  Component Value Date   CREATININE 6.05 (H) 07/18/2022   CREATININE 3.74 (H) 07/02/2022   CREATININE 6.80 (H) 06/27/2022       Chronic anemia - baseline hg Hemoglobin & Hematocrit  Recent Labs    06/27/22 0611 07/02/22 1820 07/18/22 1653  HGB 9.4* 8.6* 6.6*     While in ER: Clinical Course as of 07/18/22 2023  Mon Jul 18, 2022  1952 Discussed with Dr. Augustin Coupe, who will add patient to schedule first chair in AM.  [WS]    Clinical Course User Index [WS] Cristie Hem, MD  CXR - 1. Worsening changes of congestive heart failure. 2. Small bilateral pleural effusions. 3. Mild cardiomegaly.    Following Medications were ordered in ER: Medications - No data to display  _______________________________________________________ ER Provider Called:   Nephrology Dr.Lin They Recommend admit to medicine   Will see in AM      ED Triage Vitals  Enc Vitals Group     BP 07/18/22 1555 122/77     Pulse Rate 07/18/22 1555 83     Resp 07/18/22 1555 20     Temp 07/18/22 1555 97.7 F (36.5 C)     Temp Source 07/18/22 1555 Oral     SpO2 07/18/22 1555 95 %     Weight --      Height --      Head Circumference --      Peak Flow --      Pain Score 07/18/22 1526 0     Pain Loc --      Pain Edu? --      Excl. in [place]? --   TMAX(24)@      _________________________________________ Significant initial  Findings: Abnormal Labs Reviewed  COMPREHENSIVE METABOLIC PANEL - Abnormal; Notable for the following components:      Result Value   Chloride 97 (*)    Glucose, Bld 130 (*)    BUN 51 (*)    Creatinine, Ser 6.05 (*)    Calcium 8.6 (*)    Total Protein 6.2 (*)    Albumin 2.7 (*)    GFR, Estimated 9 (*)    All other components within normal limits  CBC WITH DIFFERENTIAL/PLATELET - Abnormal; Notable for the following components:   RBC 2.14 (*)    Hemoglobin 6.6 (*)    HCT 21.2 (*)    RDW 16.9 (*)    Eosinophils Absolute 0.7 (*)    All other components within normal limits  BRAIN NATRIURETIC PEPTIDE - Abnormal; Notable for the following components:   B Natriuretic Peptide 2,398.5 (*)    All other components within normal limits  PROTIME-INR - Abnormal; Notable for the following components:   Prothrombin Time 25.1 (*)    INR 2.3 (*)    All other components within normal limits  TROPONIN I (HIGH SENSITIVITY) - Abnormal; Notable for the following components:   Troponin I (High Sensitivity) 266 (*)    All other components within normal limits  TROPONIN I (HIGH SENSITIVITY) - Abnormal; Notable for the following components:   Troponin I (High Sensitivity) 271 (*)    All other components within normal limits     _________________________ Troponin 266 - 271 ECG: Ordered Personally reviewed and interpreted by me showing: HR : 85 Rhythm: Normal sinus rhythm Possible Left atrial enlargement Minimal voltage criteria for LVH, may be normal variant ( Cornell product ) Anterior infarct , age undetermined Abnormal ECG QTC 497   The recent clinical data is shown below. Vitals:   07/18/22 1845 07/18/22 1915 07/18/22 1930 07/18/22 2000  BP: (!) 150/87 (!) 146/93 (!) 145/92 (!) 148/94  Pulse: 90 87 93 90  Resp: (!) 30 (!) 28 (!) 21 (!) 22  Temp:      TempSrc:      SpO2: 98% 100% 98% 97%    WBC     Component Value  Date/Time   WBC 7.8 07/18/2022 1653   LYMPHSABS 1.2 07/18/2022 1653   MONOABS 0.7 07/18/2022 1653   EOSABS 0.7 (H) 07/18/2022 1653   BASOSABS 0.1 07/18/2022 1653  Hemoccult neg  Results for orders placed or performed during the hospital encounter of 07/18/22  Resp Panel by RT-PCR (Flu A&B, Covid) Anterior Nasal Swab     Status: None   Collection Time: 07/18/22  4:28 PM   Specimen: Anterior Nasal Swab  Result Value Ref Range Status   SARS Coronavirus 2 by RT PCR NEGATIVE NEGATIVE Final         Influenza A by PCR NEGATIVE NEGATIVE Final   Influenza B by PCR NEGATIVE NEGATIVE Final           _______________________________________________ Hospitalist was called for admission for fluid overload   The following Work up has been ordered so far:  Orders Placed This Encounter  Procedures   Resp Panel by RT-PCR (Flu A&B, Covid) Anterior Nasal Swab   DG Chest 2 View   Comprehensive metabolic panel   CBC with Differential   Brain natriuretic peptide   Protime-INR   Cardiac monitoring   Consult to nephrology   Consult for [facility] Admission   Pulse oximetry, continuous   POC occult blood, ED   ED EKG   Type and screen [place] initial  Findings:  labs showing:  Recent Labs  Lab 07/18/22 1653  NA 137  K 4.1  CO2 28  GLUCOSE 130*  BUN 51*  CREATININE 6.05*  CALCIUM 8.6*    Cr   Up from baseline see below Lab Results  Component Value Date   CREATININE 6.05 (H) 07/18/2022   CREATININE 3.74 (H) 07/02/2022   CREATININE 6.80 (H) 06/27/2022    Recent Labs  Lab 07/18/22 1653  AST 18  ALT 11  ALKPHOS 68  BILITOT 0.5  PROT 6.2*  ALBUMIN 2.7*   Lab Results  Component Value Date   CALCIUM 8.6 (L) 07/18/2022   PHOS 5.8 (H) 06/27/2022    Plt: Lab Results  Component Value Date   PLT 246 07/18/2022      Venous  Blood Gas result:  pH   7.435 High  Sodium 135 mmol/L  pCO2, Ven 44.0 mmHg Potassium 4.1  mmol/L  pO2, Ven 28 Low Panic  mmHg      ABG    Component Value Date/Time   PHART 7.394 06/12/2022 0624   PCO2ART 35.8 06/12/2022 0624   PO2ART 65 (L) 06/12/2022 0624   HCO3 22.0 06/12/2022 0624   TCO2 23 06/12/2022 0624   ACIDBASEDEF 3.0 (H) 06/12/2022 0624   O2SAT 93 06/12/2022 0624          Recent Labs  Lab 07/18/22 1653  WBC 7.8  NEUTROABS 5.1  HGB 6.6*  HCT 21.2*  MCV 99.1  PLT 246    HG/HCT  Down   from baseline see below    Component Value Date/Time   HGB 6.6 (LL) 07/18/2022 1653   HCT 21.2 (L) 07/18/2022 1653   MCV 99.1 07/18/2022 1653     Cardiac Panel (last 3 results) No results for input(s): "CKTOTAL", "CKMB", "TROPONINI", "RELINDX" in the last 72 hours.  .car BNP (last 3 results) Recent Labs    06/19/22 0301 07/18/22 1653  BNP 1,453.6* 2,398.5*      DM  labs:  HbA1C: Recent Labs    06/13/22 0408  HGBA1C 6.1*       CBG (last 3)  No results for input(s): "GLUCAP" in the last 72 hours.        Cultures: No results found for: "SDES", "SPECREQUEST", "CULT", "REPTSTATUS"  Radiological Exams on Admission: DG Chest 2 View  Result Date: 07/18/2022 CLINICAL DATA:  Shortness of breath. Bilateral leg swelling for several days. EXAM: CHEST - 2 VIEW COMPARISON:  07/08/2022 FINDINGS: Mildly enlarged cardiac silhouette with an interval increase in size. Interval increased prominence of the interstitial markings and increased patchy densities in both lungs. Small bilateral pleural effusions. Stable right jugular double-lumen catheter with its tip in the upper right atrium. Unremarkable bones. IMPRESSION: 1. Worsening changes of congestive heart failure. 2. Small bilateral pleural effusions. 3. Mild cardiomegaly. Electronically Signed   By: Claudie Revering M.D.   On: 07/18/2022 17:45   _______________________________________________________________________________________________________ Latest  Blood pressure (!) 148/94, pulse 90, temperature 98.2 F  (36.8 C), temperature source Oral, resp. rate (!) 22, SpO2 97 %.   Vitals  labs and radiology finding personally reviewed  Review of Systems:    Pertinent positives include:  shortness of breath at rest.  dyspnea on exertion  Constitutional:  No weight loss, night sweats, Fevers, chills, fatigue, weight loss  HEENT:  No headaches, Difficulty swallowing,Tooth/dental problems,Sore throat,  No sneezing, itching, ear ache, nasal congestion, post nasal drip,  Cardio-vascular:  No chest pain, Orthopnea, PND, anasarca, dizziness, palpitations.no Bilateral lower extremity swelling  GI:  No heartburn, indigestion, abdominal pain, nausea, vomiting, diarrhea, change in bowel habits, loss of appetite, melena, blood in stool, hematemesis Resp:    No excess mucus, no productive cough, No non-productive cough, No coughing up of blood.No change in color of mucus.No wheezing. Skin:  no rash or lesions. No jaundice GU:  no dysuria, change in color of urine, no urgency or frequency. No straining to urinate.  No flank pain.  Musculoskeletal:  No joint pain or no joint swelling. No decreased range of motion. No back pain.  Psych:  No change in mood or affect. No depression or anxiety. No memory loss.  Neuro: no localizing neurological complaints, no tingling, no weakness, no double vision, no gait abnormality, no slurred speech, no confusion  All systems reviewed and apart from [place] all are negative _______________________________________________________________________________________________ Past Medical History:   Past Medical History:  Diagnosis Date   AAA (abdominal aortic aneurysm) (HCC)    Abdominal wall mass of left lower quadrant    Basal cell carcinoma    CAD (coronary artery disease) 2011   moderate   Colon polyp    DDD (degenerative disc disease)    Depression    Diabetes mellitus type II    no meds   GERD (gastroesophageal reflux disease)    Hyperlipidemia    Neuromuscular  disorder (HCC)    Osteopenia    PAD (peripheral artery disease) (HCC)    Renal cell carcinoma    Restless leg syndrome    Sleep apnea    used a cpap yr ago-does not snore or use it now.     Past Surgical History:  Procedure Laterality Date   ABDOMINAL AORTIC ANEURYSM REPAIR     HEMORRHOID SURGERY     HERNIA REPAIR  03/06/12   ventral hernia repair   IR FLUORO GUIDE CV LINE RIGHT  06/20/2022   IR US GUIDE VASC ACCESS RIGHT  06/20/2022   NEPHRECTOMY     left due to renal cell carcenoma   VENTRAL HERNIA REPAIR  03/06/2012   Procedure: HERNIA REPAIR VENTRAL ADULT;  Surgeon: Joyice Faster. Cornett, MD;  Location: [facility];  Service: General;  Laterality: N/A;  excision of stitch granuloma    Social History:  Ambulatory  walker  wheelchair bound,      reports that he quit smoking about 2 months ago. His smoking use included cigarettes. He smoked an average of 1 pack per day. He has never used smokeless tobacco. He reports current alcohol use. He reports that he does not use drugs.    Family History:  Family History  Problem Relation Age of Onset   Diabetes Mother    Aneurysm Mother        in brain   Cancer Father        lung   Depression Father    Cancer Sister        lung, breast   Pancreatitis Brother    Heart disease Sister        MI   Depression Sister    ______________________________________________________________________________________________ Allergies: Allergies  Allergen Reactions   Dilaudid [Hydromorphone Hcl] Other (See Comments)    hallucinations   Mirapex [Pramipexole Dihydrochloride] Other (See Comments)    Leg pain   Prednisone Other (See Comments)    irritability   Gabapentin     Other reaction(s): Tremor    Prior to Admission medications   Medication Sig Start Date End Date Taking? Authorizing Provider  acetaminophen (TYLENOL) 325 MG tablet Take 2 tablets (650 mg total) by mouth every 4 (four) hours as needed for headache or mild pain.  06/27/22  Yes Arrien, Jimmy Picket, MD  aspirin EC 81 MG tablet Take 1 tablet (81 mg total) by mouth daily. Swallow whole. 07/11/22  Yes Troy Sine, MD  atorvastatin (LIPITOR) 40 MG tablet Take 40 mg by mouth at bedtime. 05/24/22  Yes [provider]  B Complex-C-Zn-Folic Acid (DIALYVITE 497-WYOV 15) 0.8 MG TABS Take 1 tablet by mouth 3 (three) times daily as needed (with meals). 07/05/22  Yes [provider]  carvedilol (COREG) 6.25 MG tablet Take 1 tablet (6.25 mg total) by mouth 2 (two) times daily with a meal. 07/14/22  Yes Troy Sine, MD  citalopram (CELEXA) 40 MG tablet Take 40 mg by mouth every evening.   Yes [provider]  clonazePAM (KLONOPIN) 0.5 MG tablet Take 0.5 mg by mouth at bedtime. 10/03/21  Yes [provider]  furosemide (LASIX) 40 MG tablet Take 1 tablet (40 mg total) by mouth 2 (two) times daily. 07/14/22  Yes Troy Sine, MD  hydrALAZINE (APRESOLINE) 25 MG tablet Take 1 tablet (25 mg total) by mouth every 8 (eight) hours. 07/14/22  Yes Troy Sine, MD  isosorbide mononitrate (IMDUR) 30 MG 24 hr tablet Take 0.5 tablets (15 mg total) by mouth daily. 07/14/22  Yes Troy Sine, MD  lansoprazole (PREVACID) 15 MG capsule Take 15 mg by mouth every morning. 01/24/22  Yes [provider]  multivitamin (RENA-VIT) TABS tablet Take 1 tablet by mouth at bedtime. 06/27/22 07/27/22 Yes Arrien, Jimmy Picket, MD  Nutritional Supplements (NEPRO) LIQD Take 237 mLs by mouth 2 (two) times daily.   Yes [provider]  QUEtiapine (SEROQUEL) 300 MG tablet Take 300 mg by mouth at bedtime. 10/03/17  Yes [provider]  warfarin (COUMADIN) 5 MG tablet Take 1 tablet daily Monday, Wednesday, Friday, Saturday and Sunday and take half tablet daily on Tuesday and Thursday. Patient taking differently: Take 2.5-5 mg by mouth See admin instructions. Take 2.5 mg (1/2 tablet) by mouth daily every day of the week except for Monday's.  On Monday's take 5 mg (1 tablet). 06/27/22  Yes Arrien, Jimmy Picket, MD  feeding supplement (  ENSURE ENLIVE / ENSURE PLUS) LIQD Take 237 mLs by mouth 2 (two) times daily between meals. Patient not taking: Reported on 07/18/2022 06/27/22 07/27/22  Tawni Millers, MD    ___________________________________________________________________________________________________ Physical Exam:    07/18/2022    8:00 PM 07/18/2022    7:30 PM 07/18/2022    7:15 PM  Vitals with BMI  Systolic 295 284 132  Diastolic 94 92 93  Pulse 90 93 87    1. General:  in No  Acute distress    Chronically ill-appearing 2. Psychological: Alert and  Oriented 3. Head/ENT:    Dry Mucous Membranes                          Head Non traumatic, neck supple                          Poor Dentition 4. SKIN:  decreased Skin turgor,  Skin clean Dry and intact no rash 5. Heart: Regular rate and rhythm no  Murmur, no Rub or gallop 6. Lungs:  no wheezes or crackles   7. Abdomen: Soft,  non-tender, Non distended   obese  bowel sounds present 8. Lower extremities: no clubbing, cyanosis trace edema 9. Neurologically Grossly intact, moving all 4 extremities equally   10. MSK: Normal range of motion    Chart has been reviewed ______________________________________________________________________  Assessment/Plan 71 y.o. male with medical history significant of end-stage renal disease on hemodialysis Tuesday Thursday Saturday, CAD, systolic CHF, DM2, GERD, OSA, aphasia, malnutrition Iron deficiency anemia   Admitted for acute chf in the setting on CKD   Present on Admission:  Acute on chronic combined systolic and diastolic CHF (congestive heart failure) ([place])  Coronary atherosclerosis of native coronary artery  Type 2 diabetes mellitus with hyperlipidemia (HCC)  GERD (gastroesophageal reflux disease)  Tobacco use disorder  Chronic respiratory failure with hypoxia (HCC)  Acute on chronic systolic CHF (congestive  heart failure) ([place])  Acute kidney injury (AKI) with acute tubular necrosis (ATN) (HCC)  Malnutrition of moderate degree  PAD (peripheral artery disease) (HCC)  LV (left ventricular) mural thrombus  Anemia     Coronary atherosclerosis of native coronary artery Will need CT surgery consult in AM Continue to follow troponin  Type 2 diabetes mellitus with hyperlipidemia ([place]) Order sliding scale  GERD (gastroesophageal reflux disease) Change to Protonix 40 mg po qhs  Acute on chronic combined systolic and diastolic CHF (congestive heart failure) ([place]) Defer to nephrology to help with fluids management  Tobacco use disorder In remmision  Chronic respiratory failure with hypoxia ([place])  this patient has acute respiratory failure with Hypoxia   as documented by the presence of following: O2 saturatio< 90% on RA  Likely due to:  CHF exacerbation, Provide O2 therapy and titrate as needed  Continuous pulse ox   check Pulse ox with ambulation prior to discharge   may need  TC consult for home O2 set up    flutter valve ordered   Acute on chronic systolic CHF (congestive heart failure) (HCC) Fluid overload in the setting of known history of CHF as well as need for hemodialysis.  We will dialyze and monitor fluid status.  Appreciate cardiology and nephrology consult  Acute kidney injury (AKI) with acute tubular necrosis (ATN) ([place]) Currently on hemodialysis appreciate Nephrology following patient is being dialyzed today It is possible that his kidney function may not recover will need to continue  monitoring defer to nephrology  Malnutrition of moderate degree Order nutritional consult  PAD (peripheral artery disease) ([place]) Will need further follow-up For vascular surgery  LV (left ventricular) mural thrombus Transition from Coumadin to heparin repeat INR in a few hours and start heparin as INR drifts below 2  Anemia Stain anemia panel in the setting of kidney disease  progressive.  Patient denies any GI bleeding.  Hemoccult negative.  Repeat hemoglobin above 7 continue to follow and transfuse as needed suspect some dilutional component as well.    Other plan as per orders.  DVT prophylaxis: heparin     Code Status:    Code Status: Prior FULL CODE   as per patient   I had personally discussed CODE STATUS with patient and family    Family Communication:   Family   at  Bedside  plan of care was discussed   with   Son,   Wife   Disposition Plan:        To home once workup is complete and patient is stable   Following barriers for discharge:                                                        Anemia corrected                                                        Will need consultants to evaluate patient prior to discharge                       Would benefit from PT/OT eval prior to DC  Ordered                                        Transition of care consulted                   Nutrition    consulted                                     Consults called: cardiology and nephrology   Admission status:  ED Disposition     ED Disposition  Admit   Condition  --   [place]: [place] [[id number]]  Level of Care: Progressive [102]  Admit to Progressive based on following criteria: CARDIOVASCULAR & THORACIC of moderate stability with acute coronary syndrome symptoms/low risk myocardial infarction/hypertensive urgency/arrhythmias/heart failure potentially compromising stability and stable post cardiovascular intervention patients.  May admit patient to [facility] or [facility] if equivalent level of care is available:: No  Covid Evaluation: Confirmed COVID Negative  Diagnosis: Acute on chronic combined systolic and diastolic CHF (congestive heart failure) [facility]) [[id number]]  Admitting Physician: Toy Baker [[id number]]  Attending Physician: Toy Baker [[id number]]  Certification:: I certify this patient will need  inpatient services for at least 2 midnights  Estimated Length of Stay: 2  inpatient     I Expect 2 midnight stay secondary to severity of patient's current illness need for inpatient interventions justified by the following:     Severe lab/radiological/exam abnormalities including:  chf   and extensive comorbidities including:  DM2    CHF  CAD  COPD/asthma  CKD  Chronic anticoagulation  That are currently affecting medical management.   I expect  patient to be hospitalized for 2 midnights requiring inpatient medical care.  Patient is at high risk for adverse outcome (such as loss of life or disability) if not treated.  Indication for inpatient stay as follows:    Need for operative/procedural  intervention New or worsening hypoxia   Need for HD    Level of care      progressive tele indefinitely please discontinue once patient no longer qualifies COVID-19 Labs    Lab Results  Component Value Date   [place] 07/18/2022     Precautions: admitted as   Covid Negative  Austyn Perriello 07/18/2022, 11:05 PM    [facility]     after 2 AM please page floor coverage PA If 7AM-7PM, please contact the day team taking care of the patient using [email or web address]   Patient was evaluated in the context of the global COVID-19 pandemic, which necessitated consideration that the patient might be at risk for infection with the SARS-CoV-2 virus that causes COVID-19. Institutional protocols and algorithms that pertain to the evaluation of patients at risk for COVID-19 are in a state of rapid change based on information released by regulatory bodies including the CDC and federal and state organizations. These policies and algorithms were followed during the patient's care.

## 2022-07-18 NOTE — Assessment & Plan Note (Signed)
Change to Protonix 40 mg po qhs

## 2022-07-18 NOTE — Assessment & Plan Note (Signed)
this patient has acute respiratory failure with Hypoxia   as documented by the presence of following: O2 saturatio< 90% on RA  Likely due to:  CHF exacerbation, Provide O2 therapy and titrate as needed  Continuous pulse ox   check Pulse ox with ambulation prior to discharge   may need  TC consult for home O2 set up    flutter valve ordered

## 2022-07-18 NOTE — Assessment & Plan Note (Signed)
Currently on hemodialysis appreciate Nephrology following patient is being dialyzed today It is possible that his kidney function may not recover will need to continue monitoring defer to nephrology

## 2022-07-18 NOTE — Assessment & Plan Note (Signed)
Order nutritional consult 

## 2022-07-18 NOTE — ED Provider Triage Note (Signed)
Emergency Medicine Provider Triage Evaluation Note  Allen Cortez , a 71 y.o. male  was evaluated in triage.  Pt complains of shortness of breath and chest pain.  States that he had a left heart cath done recently which showed 80% blockage in the "front of his heart."  And 100% blockage in the "back of his heart."  States that he is having chest tightness and shortness of breath.  States that he is unable to lay flat due to the shortness of breath.  Has had some lower extremity swelling which she is concerned over.  He wears O2 2 L at home and has not increased this.  No cough or fevers..  Review of Systems  Positive: See above Negative:   Physical Exam  BP 122/77 (BP Location: Right Arm)   Pulse 83   Temp 97.7 F (36.5 C) (Oral)   Resp 20   SpO2 95%  Gen:   Awake, no distress   Resp:  Normal effort  MSK:   Moves extremities without difficulty  Other:  Diffuse bilateral rales  Medical Decision Making  Medically screening exam initiated at 4:28 PM.  Appropriate orders placed.  [name] was informed that the remainder of the evaluation will be completed by another provider, this initial triage assessment does not replace that evaluation, and the importance of remaining in the ED until their evaluation is complete.     Mickie Hillier, PA-C 07/18/22 1629

## 2022-07-18 NOTE — Consult Note (Signed)
Cardiology Consultation   Patient ID: Allen Cortez MRN: [id number]; DOB: Feb 03, 1951  Admit date: 07/18/2022 Date of Consult: 07/18/2022  PCP:  Gerome Sam, MD   [facility] Cardiologist:  Allen Majestic, MD        Patient Profile:   Allen Cortez is a 71 y.o. male with a PMHx of nonobstructive CAD 2011, AAA s/p repair 2013, HTN, HLD, T2DM, RCC s/p partial left nephrectomy 2015, OSA, GERD, RLS. who is being seen 07/18/2022 for the evaluation of acute on chronic HFrEF, NSTEMI and known CAD at the request of the primary hospitalist service.  History of Present Illness:   Allen Cortez is a 71 y.o. male with a PMHx of nonobstructive CAD 2011, AAA s/p repair 2013, HTN, HLD, T2DM, RCC s/p partial left nephrectomy 2015, OSA, GERD, RLS.   He recently had OP workup with TTE to f/u AAA and was found to have EF 30-35% with akinesis of the left ventricular, apical septal wall, inferior wall, anterior wall and lateral wall as well as akinesis of the left ventricular, entire apical segment. There was also severe hypokinesis of the left ventricular, basal-mid anterior wall. He was also found to have an LV thrombus and was started on Eliquis.   He subsequently underwent coronary angiography at the [facility] at which time he was noted to have CTO of the LAD and severe mid Circumflex stenosis. He was referred to Dr. Kipp Cortez for consideration of CABG. Cath was c/b contrast induced nephropathy and subsequent CKD.   He was admitted to [facility] on 06/12/2022 with worsening DOE and AHRF requiring 5L [place]. CXR showed bilateral interstitial infiltrates with cephalization of vasculature felt due to pulmonary edema. Hospital course complicated by worsening renal function, now with ESRD TTS as well as acute on chronic systolic CHF.  He followed with outpatient cardiologist, Dr. Claiborne Cortez, on 06/24/22 at which time cardiac MRI was ordered for evaluation of viability of the anterior wall. Results  documented below.   Patient presented to the ED today complaining of worsening shortness of breath and dyspnea on exertion.  Upon arrival, patient hypoxic requiring 3L O2 [place].  He was slightly hypertensive to 150/87.  Labs were notable for acute on chronic normocytic anemia with hemoglobin 6.6.  Potassium stable at 4.1.  INR 2.3.  Troponin 266 -> 271.  BNP 2398 from 1453 prior.  No medications were given in the ED.  Patient was admitted to inpatient medicine for further evaluation and treatment.  Cardiology was consulted for assistance.   Past Medical History:  Diagnosis Date   AAA (abdominal aortic aneurysm) (HCC)    Abdominal wall mass of left lower quadrant    Basal cell carcinoma    CAD (coronary artery disease) 2011   moderate   Colon polyp    DDD (degenerative disc disease)    Depression    Diabetes mellitus type II    no meds   GERD (gastroesophageal reflux disease)    Hyperlipidemia    Neuromuscular disorder (HCC)    Osteopenia    PAD (peripheral artery disease) (HCC)    Renal cell carcinoma    Restless leg syndrome    Sleep apnea    used a cpap yr ago-does not snore or use it now.    Past Surgical History:  Procedure Laterality Date   ABDOMINAL AORTIC ANEURYSM REPAIR     HEMORRHOID SURGERY     HERNIA REPAIR  03/06/12   ventral hernia repair   IR FLUORO GUIDE  CV LINE RIGHT  06/20/2022   IR US GUIDE VASC ACCESS RIGHT  06/20/2022   NEPHRECTOMY     left due to renal cell carcenoma   VENTRAL HERNIA REPAIR  03/06/2012   Procedure: HERNIA REPAIR VENTRAL ADULT;  Surgeon: Joyice Faster. Cornett, MD;  Location: [facility];  Service: General;  Laterality: N/A;  excision of stitch granuloma     Home Medications:  Prior to Admission medications   Medication Sig Start Date End Date Taking? Authorizing Provider  acetaminophen (TYLENOL) 325 MG tablet Take 2 tablets (650 mg total) by mouth every 4 (four) hours as needed for headache or mild pain. 06/27/22  Yes Arrien, Jimmy Picket, MD  aspirin EC 81 MG tablet Take 1 tablet (81 mg total) by mouth daily. Swallow whole. 07/11/22  Yes Troy Sine, MD  atorvastatin (LIPITOR) 40 MG tablet Take 40 mg by mouth at bedtime. 05/24/22  Yes [provider]  B Complex-C-Zn-Folic Acid (DIALYVITE 053-ZJQB 15) 0.8 MG TABS Take 1 tablet by mouth 3 (three) times daily as needed (with meals). 07/05/22  Yes [provider]  carvedilol (COREG) 6.25 MG tablet Take 1 tablet (6.25 mg total) by mouth 2 (two) times daily with a meal. 07/14/22  Yes Troy Sine, MD  citalopram (CELEXA) 40 MG tablet Take 40 mg by mouth every evening.   Yes [provider]  clonazePAM (KLONOPIN) 0.5 MG tablet Take 0.5 mg by mouth at bedtime. 10/03/21  Yes [provider]  furosemide (LASIX) 40 MG tablet Take 1 tablet (40 mg total) by mouth 2 (two) times daily. 07/14/22  Yes Troy Sine, MD  hydrALAZINE (APRESOLINE) 25 MG tablet Take 1 tablet (25 mg total) by mouth every 8 (eight) hours. 07/14/22  Yes Troy Sine, MD  isosorbide mononitrate (IMDUR) 30 MG 24 hr tablet Take 0.5 tablets (15 mg total) by mouth daily. 07/14/22  Yes Troy Sine, MD  lansoprazole (PREVACID) 15 MG capsule Take 15 mg by mouth every morning. 01/24/22  Yes [provider]  multivitamin (RENA-VIT) TABS tablet Take 1 tablet by mouth at bedtime. 06/27/22 07/27/22 Yes Arrien, Jimmy Picket, MD  Nutritional Supplements (NEPRO) LIQD Take 237 mLs by mouth 2 (two) times daily.   Yes [provider]  QUEtiapine (SEROQUEL) 300 MG tablet Take 300 mg by mouth at bedtime. 10/03/17  Yes [provider]  warfarin (COUMADIN) 5 MG tablet Take 1 tablet daily Monday, Wednesday, Friday, Saturday and Sunday and take half tablet daily on Tuesday and Thursday. Patient taking differently: Take 2.5-5 mg by mouth See admin instructions. Take 2.5 mg (1/2 tablet) by mouth daily every day of the week except for Monday's. On Monday's take 5 mg (1  tablet). 06/27/22  Yes Arrien, Jimmy Picket, MD  feeding supplement (ENSURE ENLIVE / ENSURE PLUS) LIQD Take 237 mLs by mouth 2 (two) times daily between meals. Patient not taking: Reported on 07/18/2022 06/27/22 07/27/22  Arrien, Jimmy Picket, MD    Inpatient Medications: Scheduled Meds:  sodium chloride   Intravenous Once   [START ON 07/19/2022] Chlorhexidine Gluconate Cloth  6 each Topical Q0600    HYDROmorphone (DILAUDID) injection  0.5 mg Intravenous Once   insulin aspart  0-6 Units Subcutaneous Q4H   Continuous Infusions:  PRN Meds:   Allergies:    Allergies  Allergen Reactions   Dilaudid [Hydromorphone Hcl] Other (See Comments)    hallucinations   Mirapex [Pramipexole Dihydrochloride] Other (See Comments)    Leg pain  Prednisone Other (See Comments)    irritability   Gabapentin     Other reaction(s): Tremor    Social History:   Social History   Socioeconomic History   Marital status: Married    Spouse name: Not on file   Number of children: Not on file   Years of education: Not on file   Highest education level: Not on file  Occupational History   Not on file  Tobacco Use   Smoking status: Former    Packs/day: 1.00    Types: Cigarettes    Quit date: 05/09/2022    Years since quitting: 0.1   Smokeless tobacco: Never  Substance and Sexual Activity   Alcohol use: Yes    Comment: glass of wine at night   Drug use: No   Sexual activity: Not on file  Other Topics Concern   Not on file  Social History Narrative   Not on file   Social Determinants of Health   Financial Resource Strain: Not on file  Food Insecurity: Not on file  Transportation Needs: Not on file  Physical Activity: Not on file  Stress: Not on file  Social Connections: Not on file  Intimate Partner Violence: Not on file    Family History:    Family History  Problem Relation Age of Onset   Diabetes Mother    Aneurysm Mother        in brain   Cancer Father        lung    Depression Father    Cancer Sister        lung, breast   Pancreatitis Brother    Heart disease Sister        MI   Depression Sister      ROS:  Please see the history of present illness. All other ROS reviewed and negative unless stated in HPI above.     Physical Exam/Data:   Vitals:   07/18/22 1915 07/18/22 1930 07/18/22 2000 07/18/22 2100  BP: (!) 146/93 (!) 145/92 (!) 148/94 (!) 141/90  Pulse: 87 93 90 88  Resp: (!) 28 (!) 21 (!) 22 18  Temp:      TempSrc:      SpO2: 100% 98% 97% 98%   No intake or output data in the 24 hours ending 07/18/22 2205    07/08/2022    8:50 AM 06/29/2022    2:53 PM 06/27/2022    3:45 AM  Last 3 Weights  Weight (lbs) 156 lb 9.6 oz 156 lb 155 lb 4.8 oz  Weight (kg) 71.033 kg 70.761 kg 70.444 kg     There is no height or weight on file to calculate BMI.  General:  Well nourished, well developed, increased work of breathing HEENT: normal Neck: no JVD Vascular: No carotid bruits; Distal pulses 2+ bilaterally Cardiac:  normal S1, S2; RRR; no murmur, rubs, gallops Lungs:  clear to auscultation bilaterally, crackles in the bilateral lung bases  Abd: soft, nontender, no hepatomegaly  Ext: no edema Musculoskeletal:  No deformities, BUE and BLE strength normal and equal Skin: warm and dry  Neuro:  CNs 2-12 intact, no focal abnormalities noted Psych:  Normal affect   Relevant CV Studies: ECHO 06/12/2022:  1. 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 Left ventricular ejection fraction, by estimation, is 30-35%. The left ventricle is abnormal. The left ventricle has focal regional wall  motion abnormalities. The left ventricular internal cavity size was mildly dilated. There is akinesis of  the left ventricular, apical septal wall, inferior wall, anterior wall and lateral wall. There is akinesis of the left ventricular, entire apical segment. There is severe  hypokinesis of the left ventricular, basal-mid anterior wall. A falst tendon is present in the mid LV cavity.   2. Right ventricular systolic function is normal. The right ventricular size is normal. There is moderately elevated pulmonary artery systolic pressure. The estimated right ventricular systolic pressure is 22.0 mmHg.   3. The mitral valve is normal in structure. Mild mitral valve regurgitation. No evidence of mitral stenosis.   4. The aortic valve is tricuspid. There is moderate calcification of the aortic valve. Aortic valve regurgitation is not visualized. Aortic valve sclerosis/calcification is present, without any evidence of aortic stenosis.   5. The inferior vena cava is dilated in size with >50% respiratory variability, suggesting right atrial pressure of 8 mmHg.    CMR 07/15/2022: 1. Subendocardial LGE consistent with prior infarct in mid anterior/anteroseptal, apical anterior/septal/inferior/lateral walls, and apex. LGE is greater than 50% transmural suggesting nonviability in apical anterior/septal/inferior/lateral walls and apex. LGE <50% transmural in mid anterior wall but wall thickness <4.64m suggests nonviability. Findings are consistent with nonviability in mid to distal LAD territory 2. Severe LV dilatation with severe systolic dysfunction (EF 225%. Thinning/akinesis of mid to apical anterior/anteroseptal walls and apical inferior/lateral walls. Aneurysm of apex. 3.  LV apical thrombus measures 1375mx 75m65m.  Normal RV size and systolic function (EF 48%42%. Diffuse pulmonary opacities and bilateral pleural effusions (moderate on right, small on left), consider CT chest for further evaluation.   Laboratory Data:  High Sensitivity Troponin:   Recent Labs  Lab 07/18/22 1653 07/18/22 1845 07/18/22 2045  TROPONINIHS 266* 271* 256*     Chemistry Recent Labs  Lab 07/18/22 1653 07/18/22 2046  NA 137 135  K 4.1 4.1  CL 97*  --   CO2 28  --   GLUCOSE 130*  --    BUN 51*  --   CREATININE 6.05*  --   CALCIUM 8.6*  --   GFRNONAA 9*  --   ANIONGAP 12  --     Recent Labs  Lab 07/18/22 1653 07/18/22 2045  PROT 6.2* 6.3*  ALBUMIN 2.7* 2.7*  AST 18 19  ALT 11 11  ALKPHOS 68 59  BILITOT 0.5 0.7   Lipids No results for input(s): "CHOL", "TRIG", "HDL", "LABVLDL", "LDLCALC", "CHOLHDL" in the last 168 hours.  Hematology Recent Labs  Lab 07/18/22 1653 07/18/22 2046  WBC 7.8  --   RBC 2.14*  --   HGB 6.6* 7.1*  HCT 21.2* 21.0*  MCV 99.1  --   MCH 30.8  --   MCHC 31.1  --   RDW 16.9*  --   PLT 246  --    Thyroid No results for input(s): "TSH", "FREET4" in the last 168 hours.  BNP Recent Labs  Lab 07/18/22 1653  BNP 2,398.5*    DDimer No results for input(s): "DDIMER" in the last 168 hours.   Radiology/Studies:  DG Chest 2 View  Result Date: 07/18/2022 CLINICAL DATA:  Shortness of breath. Bilateral leg swelling for several days. EXAM: CHEST - 2 VIEW COMPARISON:  07/08/2022 FINDINGS: Mildly enlarged cardiac silhouette with an interval increase in size. Interval increased prominence of the interstitial markings and increased patchy densities in both lungs. Small bilateral pleural effusions. Stable right jugular double-lumen  catheter with its tip in the upper right atrium. Unremarkable bones. IMPRESSION: 1. Worsening changes of congestive heart failure. 2. Small bilateral pleural effusions. 3. Mild cardiomegaly. Electronically Signed   By: Claudie Revering M.D.   On: 07/18/2022 17:45   MR CARDIAC MORPHOLOGY W WO CONTRAST  Result Date: 07/17/2022 CLINICAL DATA:  28M with CAD, ICM, ESRD, LV thrombus, AAA, RCC s/p partial nephrectomy, T2DM. Cath at [facility] showed occluded mid LAD and severe disease in mid LCX. Echo shows EF 30-35%, apical thrombus. CMR to assess viability EXAM: CARDIAC MRI TECHNIQUE: The patient was scanned on a 1.5 Tesla Siemens magnet. A dedicated cardiac coil was used. Functional imaging was done using Fiesta sequences. 2,3, and 4  chamber views were done to assess for RWMA's. Modified Simpson's rule using a short axis stack was used to calculate an ejection fraction on a dedicated work Conservation officer, nature. The patient received 10 cc of Gadavist. After 10 minutes inversion recovery sequences were used to assess for infiltration and scar tissue. CONTRAST:  10 cc  of Gadavist FINDINGS: Left ventricle: -Severe dilatation -Severe systolic dysfunction. Thinning/akinesis of mid to apical anterior/anteroseptal walls, apical inferior/lateral wall. Aneurysm of apex -LV apical thrombus measures 68m x 759m-Mild ECV elevation (30%) -Normal T2 values -Subendocardial LGE consistent with prior infarct in mid anterior/anteroseptal, apical anterior/septal/inferior/lateral walls, and apex. LGE is greater than 50% transmural suggesting nonviability in apical anterior/septal/inferior/lateral walls and apex. LGE <50% transmural in mid anterior wall but wall thickness <4.61m20muggests nonviability. Findings are consistent with nonviability in mid to distal LAD territory LV EF: 24% (Normal 56-78%) Absolute volumes: LV EDV: 346m29mormal 77-195 mL) LV ESV: 263mL33mrmal 19-72 mL) LV SV: 83mL 11mmal 51-133 mL) CO: 6.3L/min (Normal 2.8-8.8 L/min) Indexed volumes: LV EDV: 1861mL/s16m(Normal 47-92 mL/sq-m) LV ESV: 140mL/sq36mNormal 13-30 mL/sq-m) LV SV: 44mL/sq-39mormal 32-62 mL/sq-m) CI: 3.4L/min/sq-m (Normal 1.7-4.2 L/min/sq-m) Right ventricle: Normal size and systolic function RV EF:  48% (Normal 47-74%) Absolute volumes: RV EDV: 173mL (Nor69m88-227 mL) RV ESV: 89mL (Norm34m3-103 mL) RV SV: 84mL (Norma49m-138 mL) CO: 6.4L/min (Normal 2.8-8.8 L/min) Indexed volumes: RV EDV: 92mL/sq-m (N1ml 55-105 mL/sq-m) RV ESV: 48mL/sq-m (No64m 15-43 mL/sq-m) RV SV: 461mL/sq-m (Nor86m32-64 mL/sq-m) CI: 3.4L/min/sq-m (Normal 1.7-4.2 L/min/sq-m) Left atrium: Mild enlargement Right atrium: Normal size Mitral valve: Mild regurgitation Aortic valve: Tricuspid.  Mild  regurgitation Tricuspid valve: Mild regurgitation Pulmonic valve: No regurgitation Aorta: Normal proximal ascending aorta Pericardium: Normal Extracardiac structures: Diffuse pulmonary opacities and bilateral pleural effusions (moderate on right, small on left), consider CT chest for further evaluation IMPRESSION: 1. Subendocardial LGE consistent with prior infarct in mid anterior/anteroseptal, apical anterior/septal/inferior/lateral walls, and apex. LGE is greater than 50% transmural suggesting nonviability in apical anterior/septal/inferior/lateral walls and apex. LGE <50% transmural in mid anterior wall but wall thickness <4.61mm suggests no43mability. Findings are consistent with nonviability in mid to distal LAD territory 2. Severe LV dilatation with severe systolic dysfunction (EF 24%). Thinning/a03%esis of mid to apical anterior/anteroseptal walls and apical inferior/lateral walls. Aneurysm of apex 3.  LV apical thrombus measures 11mm x 7mm 4.  N103ml RV18mze and systolic function (EF 48%) 5. Diffuse pu00%nary opacities and bilateral pleural effusions (moderate on right, small on left), consider CT chest for further evaluation Electronically Signed   By: Christopher  SchumOswaldo Milian2023 15:09     Assessment and Plan:   Acute Hypoxic Respiratory Failure/DOE - 2/2 volume overload in the s/o HFrEF and ESRD TTS  -  acute on chronic anemia is likely complicating the picture - Nephrology has been consulted for HD tonight - primary team to work-up and treat anemia - tx of HFrEF as below   Type 2 MI/NSTEMI - Troponin 266 -> 271 - ECG without acute ischemic ST changes - likely due to volume overload as below - continue to monitor and trend troponin to a peak   Acute on Chronic HFrEF Ischemic cardiomyopathy HTN - Echo 06/12/22 LVEF 30-35% with clear evidence of LV thrombus. Akinesis of the anterior wall. No significant valve disease. He does not appear volume overloaded on exam.  -  Outpatient plan was to avoid ACEi/ARB/ARNI/spiro given renal disease. Current medical therapy includes Amlodipine, Carvedilol, Imdur, Hydralazine but not receiving hydralazine due to hold parameters for SBP<135 - given LV dysfunction Amlodipine was held in hopes to be able to administer the hydralazine going forward as part of GDMT for his cardiomyopathy - Plan: - volume management per nephrology, plans for HD today - continue home Lasix '40mg'$  po BID (pt admits to minimal UOP) - continue home Coreg 6.'25mg'$  po BID  - actually okay to start ACE in the s/o ESRD if hyperkalemia does not become a problem as cardiac benefit still exists, would prefer ACE over Hydralazine/Imdur for afterload reduction in this patient - would start Lisinopril '5mg'$  po daily and monitor K closely, if able, plan to transition to [facility] in the future - hold home Hydralazine '25mg'$  po q8hr - hold home Imdur '15mg'$  po daily  - holding Aldactone in the s/o ESRD - unable to give SGLT2 in the s/o ESRD  CAD with angina - He has severe two vessel CAD by cath at the [facility], CTO LAD and 80% LCx. It was thought in clinic that his coronary anatomy was favorable for CABG. CMR 07/15/22 without viable myocardium. However, it is known in the s/o ICM with EF < 30% that benefit of reperfusion still exists. Consult CTS in AM for discussion.  - continue ASA '81mg'$  po daily - continue Atorvastatin '40mg'$  po daily  - tx of HFrEF as above  LV thrombus - diagnosed at the [place] in August 2023  - holding Warfarin until CTS eval - heparin gtt started  AAA s/p remote repair with recurrence - family clarifies the repeat size last month was 4.2cm and that no specific intervention was felt needed, only to follow going forward  HTN - continue home Coreg 6.'25mg'$  po BID - start Lisinopril '5mg'$  po daily as above - hold home Hydralazine '25mg'$  po q8hr  - hold home Imdur '15mg'$  po daily  - continue to monitor and titrate meds as needed  HLD - LDL 06/15/22: 60  -  continue home Atorvastatin '40mg'$  po daily    Risk Assessment/Risk Scores:     TIMI Risk Score for Unstable Angina or Non-ST Elevation MI:   The patient's TIMI risk score is 5, which indicates a 26% risk of all cause mortality, new or recurrent myocardial infarction or need for urgent revascularization in the next 14 days.{   New York Heart Association (NYHA) Functional Class NYHA Class III        For questions or updates, please contact [place] Please consult [email or web address] for contact info under    Signed, Temple Pacini, MD  07/18/2022 10:05 PM

## 2022-07-18 NOTE — Assessment & Plan Note (Signed)
Stain anemia panel in the setting of kidney disease progressive.  Patient denies any GI bleeding.  Hemoccult negative.  Repeat hemoglobin above 7 continue to follow and transfuse as needed suspect some dilutional component as well.

## 2022-07-18 NOTE — Assessment & Plan Note (Signed)
In remmision

## 2022-07-18 NOTE — Assessment & Plan Note (Signed)
Order sliding scale  

## 2022-07-18 NOTE — Subjective & Objective (Signed)
Patient comes in from home for shortness of breath last hemodialysis was on Saturday has been having lower extremity swelling started for the past 2 days patient is on 3 L of oxygen at baseline have not had any chest pain no nausea no vomiting Has known history of CAD had a recent admission for cardiac catheterization that showed 80% blockage in 1 side and 100% in the artery. Reports having chest tightness unable to lay flat because he is so short of breath no fevers no chills Patient has been dialysis dependent since his cardiac catheterization currently getting it on Tuesday Thursday Saturday.

## 2022-07-18 NOTE — Assessment & Plan Note (Signed)
Will need CT surgery consult in AM Continue to follow troponin

## 2022-07-18 NOTE — Assessment & Plan Note (Signed)
Transition from Coumadin to heparin repeat INR in a few hours and start heparin as INR drifts below 2

## 2022-07-18 NOTE — Assessment & Plan Note (Signed)
Fluid overload in the setting of known history of CHF as well as need for hemodialysis.  We will dialyze and monitor fluid status.  Appreciate cardiology and nephrology consult

## 2022-07-18 NOTE — ED Triage Notes (Signed)
Patient BIB [facility] from home for evaluation of shortness of breath, history of CKD and CHF, last dialysis treatment on Saturday. Patient reports lower extremity swelling that started a few days ago. Wears 3L O2  at baseline. Denies chest pain, denies nausea, denies vomiting. Patient is alert, oriented, and in no apparent distress at this time. 18g saline lock in left AC. History of AAA that is being monitoring, reported last diameter approximately 4.2 cm.  EMS Vitals BP 130/85 HR 81 SpO2 99% on 4L O2  RR 32 and shallow CBG 155

## 2022-07-19 ENCOUNTER — Other Ambulatory Visit: Payer: Self-pay | Admitting: *Deleted

## 2022-07-19 ENCOUNTER — Other Ambulatory Visit: Payer: Self-pay

## 2022-07-19 ENCOUNTER — Encounter (HOSPITAL_COMMUNITY): Payer: Self-pay | Admitting: Internal Medicine

## 2022-07-19 DIAGNOSIS — I5043 Acute on chronic combined systolic (congestive) and diastolic (congestive) heart failure: Secondary | ICD-10-CM | POA: Diagnosis not present

## 2022-07-19 DIAGNOSIS — D649 Anemia, unspecified: Secondary | ICD-10-CM | POA: Diagnosis not present

## 2022-07-19 DIAGNOSIS — N170 Acute kidney failure with tubular necrosis: Secondary | ICD-10-CM | POA: Diagnosis not present

## 2022-07-19 DIAGNOSIS — I739 Peripheral vascular disease, unspecified: Secondary | ICD-10-CM

## 2022-07-19 DIAGNOSIS — I5023 Acute on chronic systolic (congestive) heart failure: Secondary | ICD-10-CM | POA: Diagnosis not present

## 2022-07-19 DIAGNOSIS — F17200 Nicotine dependence, unspecified, uncomplicated: Secondary | ICD-10-CM

## 2022-07-19 LAB — BPAM RBC
Blood Product Expiration Date: 202311162359
ISSUE DATE / TIME: 202310162344
Unit Type and Rh: 5100

## 2022-07-19 LAB — TYPE AND SCREEN
ABO/RH(D): O POS
Antibody Screen: NEGATIVE
Unit division: 0

## 2022-07-19 LAB — CBC
HCT: 24.4 % — ABNORMAL LOW (ref 39.0–52.0)
Hemoglobin: 8 g/dL — ABNORMAL LOW (ref 13.0–17.0)
MCH: 31.5 pg (ref 26.0–34.0)
MCHC: 32.8 g/dL (ref 30.0–36.0)
MCV: 96.1 fL (ref 80.0–100.0)
Platelets: 204 10*3/uL (ref 150–400)
RBC: 2.54 MIL/uL — ABNORMAL LOW (ref 4.22–5.81)
RDW: 16.1 % — ABNORMAL HIGH (ref 11.5–15.5)
WBC: 8.3 10*3/uL (ref 4.0–10.5)
nRBC: 0 % (ref 0.0–0.2)

## 2022-07-19 LAB — FOLATE: Folate: 35.4 ng/mL (ref >5.9)

## 2022-07-19 LAB — BLOOD GAS, VENOUS
Acid-Base Excess: 6.1 mmol/L — ABNORMAL HIGH (ref 0.0–2.0)
Bicarbonate: 31.8 mmol/L — ABNORMAL HIGH (ref 20.0–28.0)
Drawn by: 7331
O2 Saturation: 73.2 %
Patient temperature: 37.1
pCO2, Ven: 49 mmHg (ref 44–60)
pH, Ven: 7.42 (ref 7.25–7.43)
pO2, Ven: 46 mmHg — ABNORMAL HIGH (ref 32–45)

## 2022-07-19 LAB — HEPATITIS C ANTIBODY: HCV Ab: NONREACTIVE

## 2022-07-19 LAB — HEPATITIS B SURFACE ANTIBODY,QUALITATIVE: Hep B S Ab: NONREACTIVE

## 2022-07-19 LAB — PREALBUMIN: Prealbumin: 15 mg/dL — ABNORMAL LOW (ref 18–38)

## 2022-07-19 LAB — GLUCOSE, CAPILLARY
Glucose-Capillary: 114 mg/dL — ABNORMAL HIGH (ref 70–99)
Glucose-Capillary: 100 mg/dL — ABNORMAL HIGH (ref 70–99)
Glucose-Capillary: 105 mg/dL — ABNORMAL HIGH (ref 70–99)

## 2022-07-19 LAB — RETICULOCYTES
Immature Retic Fract: 39.4 % — ABNORMAL HIGH (ref 2.3–15.9)
RBC.: 1.4 MIL/uL — ABNORMAL LOW (ref 4.22–5.81)
Retic Count, Absolute: 55.2 10*3/uL (ref 19.0–186.0)
Retic Ct Pct: 3.9 % — ABNORMAL HIGH (ref 0.4–3.1)

## 2022-07-19 LAB — PROTIME-INR
INR: 2.1 — ABNORMAL HIGH (ref 0.8–1.2)
Prothrombin Time: 23.7 seconds — ABNORMAL HIGH (ref 11.4–15.2)

## 2022-07-19 LAB — VITAMIN B12: Vitamin B-12: 1131 pg/mL — ABNORMAL HIGH (ref 180–914)

## 2022-07-19 LAB — TROPONIN I (HIGH SENSITIVITY): Troponin I (High Sensitivity): 288 ng/L (ref <18)

## 2022-07-19 LAB — PHOSPHORUS: Phosphorus: 3.2 mg/dL (ref 2.5–4.6)

## 2022-07-19 MED ORDER — HYDRALAZINE HCL 50 MG PO TABS
50.0000 mg | ORAL_TABLET | Freq: Two times a day (BID) | ORAL | Status: DC
  Administered 2022-07-19 – 2022-07-20 (×2): 50 mg via ORAL
  Filled 2022-07-19 (×2): qty 1

## 2022-07-19 MED ORDER — ATORVASTATIN CALCIUM 40 MG PO TABS
40.0000 mg | ORAL_TABLET | Freq: Every day | ORAL | Status: DC
  Administered 2022-07-19 – 2022-07-30 (×11): 40 mg via ORAL
  Filled 2022-07-19 (×12): qty 1

## 2022-07-19 MED ORDER — SODIUM CHLORIDE 0.9% FLUSH: 3.0000 mL | INTRAVENOUS | Status: DC | PRN

## 2022-07-19 MED ORDER — INSULIN ASPART 100 UNIT/ML IJ SOLN: 0.0000 [IU] | Freq: Three times a day (TID) | INTRAMUSCULAR | Status: DC

## 2022-07-19 MED ORDER — RENA-VITE PO TABS
1.0000 | ORAL_TABLET | Freq: Every day | ORAL | Status: DC
  Administered 2022-07-19 – 2022-07-30 (×10): 1 via ORAL
  Filled 2022-07-19 (×11): qty 1

## 2022-07-19 MED ORDER — SODIUM CHLORIDE 0.9% FLUSH
3.0000 mL | Freq: Two times a day (BID) | INTRAVENOUS | Status: DC
  Administered 2022-07-19 – 2022-07-22 (×5): 3 mL via INTRAVENOUS

## 2022-07-19 MED ORDER — ALBUTEROL SULFATE (2.5 MG/3ML) 0.083% IN NEBU
2.5000 mg | INHALATION_SOLUTION | RESPIRATORY_TRACT | Status: DC | PRN
  Administered 2022-07-26 – 2022-07-30 (×2): 2.5 mg via RESPIRATORY_TRACT
  Filled 2022-07-19 (×4): qty 3

## 2022-07-19 MED ORDER — ATORVASTATIN CALCIUM 40 MG PO TABS: 40.0000 mg | ORAL_TABLET | Freq: Every day | ORAL | Status: DC

## 2022-07-19 MED ORDER — ACETAMINOPHEN 650 MG RE SUPP: 650.0000 mg | Freq: Four times a day (QID) | RECTAL | Status: DC | PRN

## 2022-07-19 MED ORDER — GUAIFENESIN ER 600 MG PO TB12
600.0000 mg | ORAL_TABLET | Freq: Two times a day (BID) | ORAL | Status: DC
  Administered 2022-07-19 – 2022-08-02 (×20): 600 mg via ORAL
  Filled 2022-07-19 (×23): qty 1

## 2022-07-19 MED ORDER — QUETIAPINE FUMARATE 300 MG PO TABS
300.0000 mg | ORAL_TABLET | Freq: Every day | ORAL | Status: DC
  Administered 2022-07-19 – 2022-08-01 (×13): 300 mg via ORAL
  Filled 2022-07-19 (×17): qty 1

## 2022-07-19 MED ORDER — CLONAZEPAM 0.5 MG PO TABS
0.5000 mg | ORAL_TABLET | Freq: Every day | ORAL | Status: DC
  Administered 2022-07-19 – 2022-08-01 (×12): 0.5 mg via ORAL
  Filled 2022-07-19 (×14): qty 1

## 2022-07-19 MED ORDER — ACETAMINOPHEN 325 MG PO TABS
650.0000 mg | ORAL_TABLET | Freq: Four times a day (QID) | ORAL | Status: DC | PRN
  Administered 2022-07-25: 650 mg via ORAL

## 2022-07-19 MED ORDER — BISACODYL 5 MG PO TBEC: 10.0000 mg | DELAYED_RELEASE_TABLET | Freq: Every day | ORAL | Status: DC | PRN

## 2022-07-19 MED ORDER — NEPRO/CARBSTEADY PO LIQD
237.0000 mL | Freq: Three times a day (TID) | ORAL | Status: DC
  Administered 2022-07-19 – 2022-07-31 (×11): 237 mL via ORAL

## 2022-07-19 MED ORDER — ASPIRIN 81 MG PO TBEC
81.0000 mg | DELAYED_RELEASE_TABLET | Freq: Every day | ORAL | Status: DC
  Administered 2022-07-19 – 2022-07-26 (×7): 81 mg via ORAL
  Filled 2022-07-19 (×7): qty 1

## 2022-07-19 MED ORDER — PROSOURCE PLUS PO LIQD
30.0000 mL | Freq: Two times a day (BID) | ORAL | Status: DC
  Filled 2022-07-19: qty 30

## 2022-07-19 MED ORDER — HEPARIN SODIUM (PORCINE) 1000 UNIT/ML IJ SOLN
INTRAMUSCULAR | Status: AC
  Administered 2022-07-19: 4000 [IU]
  Filled 2022-07-19: qty 4

## 2022-07-19 MED ORDER — CARVEDILOL 6.25 MG PO TABS
6.2500 mg | ORAL_TABLET | Freq: Two times a day (BID) | ORAL | Status: DC
  Administered 2022-07-19 – 2022-08-02 (×17): 6.25 mg via ORAL
  Filled 2022-07-19 (×21): qty 1

## 2022-07-19 MED ORDER — HYDRALAZINE HCL 25 MG PO TABS
25.0000 mg | ORAL_TABLET | Freq: Three times a day (TID) | ORAL | Status: DC
  Administered 2022-07-19: 25 mg via ORAL
  Filled 2022-07-19: qty 1

## 2022-07-19 MED ORDER — SODIUM CHLORIDE 0.9 % IV SOLN
250.0000 mg | INTRAVENOUS | Status: AC
  Administered 2022-07-19 – 2022-07-26 (×4): 250 mg via INTRAVENOUS
  Filled 2022-07-19 (×8): qty 20

## 2022-07-19 MED ORDER — SODIUM CHLORIDE 0.9 % IV SOLN: 250.0000 mL | INTRAVENOUS | Status: DC | PRN

## 2022-07-19 MED ORDER — ISOSORBIDE DINITRATE 20 MG PO TABS
20.0000 mg | ORAL_TABLET | Freq: Two times a day (BID) | ORAL | Status: DC
  Administered 2022-07-19 – 2022-08-02 (×18): 20 mg via ORAL
  Filled 2022-07-19 (×30): qty 1

## 2022-07-19 NOTE — Progress Notes (Signed)
ANTICOAGULATION CONSULT NOTE - Initial Consult  Pharmacy Consult for heparin  Indication: LV thrombus  Allergies  Allergen Reactions   Dilaudid [Hydromorphone Hcl] Other (See Comments)    hallucinations   Mirapex [Pramipexole Dihydrochloride] Other (See Comments)    Leg pain   Prednisone Other (See Comments)    irritability   Gabapentin     Other reaction(s): Tremor    Patient Measurements: Height: '5\' 10"'$  (177.8 cm) Weight: 68.6 kg (151 lb 3.8 oz) IBW/kg (Calculated) : 73  Vital Signs: Temp: 98 F (36.7 C) (10/17 0741) Temp Source: Oral (10/17 0741) BP: 136/80 (10/17 0741) Pulse Rate: 80 (10/17 0741)  Labs: Recent Labs    07/18/22 1653 07/18/22 1845 07/18/22 1915 07/18/22 2045 07/18/22 2046 07/19/22 0454  HGB 6.6*  --   --   --  7.1* 8.0*  HCT 21.2*  --   --   --  21.0* 24.4*  PLT 246  --   --   --   --  204  LABPROT  --   --  25.1*  --   --  23.7*  INR  --   --  2.3*  --   --  2.1*  CREATININE 6.05*  --   --   --   --   --   CKTOTAL  --   --   --  106  --   --   TROPONINIHS 266* 271*  --  256*  --  288*    Estimated Creatinine Clearance: 10.7 mL/min (A) (by C-G formula based on SCr of 6.05 mg/dL (H)).   Medical History: Past Medical History:  Diagnosis Date   AAA (abdominal aortic aneurysm) (HCC)    Abdominal wall mass of left lower quadrant    Basal cell carcinoma    CAD (coronary artery disease) 2011   moderate   Colon polyp    DDD (degenerative disc disease)    Depression    Diabetes mellitus type II    no meds   GERD (gastroesophageal reflux disease)    Hyperlipidemia    Neuromuscular disorder (HCC)    Osteopenia    PAD (peripheral artery disease) (HCC)    Renal cell carcinoma    Restless leg syndrome    Sleep apnea    used a cpap yr ago-does not snore or use it now.    Medications:  Medications Prior to Admission  Medication Sig Dispense Refill Last Dose   acetaminophen (TYLENOL) 325 MG tablet Take 2 tablets (650 mg total) by mouth  every 4 (four) hours as needed for headache or mild pain.   Past Month   aspirin EC 81 MG tablet Take 1 tablet (81 mg total) by mouth daily. Swallow whole. 30 tablet 3 07/18/2022   atorvastatin (LIPITOR) 40 MG tablet Take 40 mg by mouth at bedtime.   07/17/2022   B Complex-C-Zn-Folic Acid (DIALYVITE 678-LFYB 15) 0.8 MG TABS Take 1 tablet by mouth 3 (three) times daily as needed (with meals).   Past Week   carvedilol (COREG) 6.25 MG tablet Take 1 tablet (6.25 mg total) by mouth 2 (two) times daily with a meal. 180 tablet 3 07/18/2022 at 0900   citalopram (CELEXA) 40 MG tablet Take 40 mg by mouth every evening.   07/17/2022   clonazePAM (KLONOPIN) 0.5 MG tablet Take 0.5 mg by mouth at bedtime.   07/17/2022   furosemide (LASIX) 40 MG tablet Take 1 tablet (40 mg total) by mouth 2 (two) times daily. 180 tablet 3  07/18/2022   hydrALAZINE (APRESOLINE) 25 MG tablet Take 1 tablet (25 mg total) by mouth every 8 (eight) hours. 270 tablet 3 07/18/2022   isosorbide mononitrate (IMDUR) 30 MG 24 hr tablet Take 0.5 tablets (15 mg total) by mouth daily. 45 tablet 3 07/18/2022   lansoprazole (PREVACID) 15 MG capsule Take 15 mg by mouth every morning.   07/18/2022   multivitamin (RENA-VIT) TABS tablet Take 1 tablet by mouth at bedtime. 30 tablet 0 07/17/2022   Nutritional Supplements (NEPRO) LIQD Take 237 mLs by mouth 2 (two) times daily.   07/18/2022   QUEtiapine (SEROQUEL) 300 MG tablet Take 300 mg by mouth at bedtime.   07/17/2022   warfarin (COUMADIN) 5 MG tablet Take 1 tablet daily Monday, Wednesday, Friday, Saturday and Sunday and take half tablet daily on Tuesday and Thursday. (Patient taking differently: Take 2.5-5 mg by mouth See admin instructions. Take 2.5 mg (1/2 tablet) by mouth daily every day of the week except for Monday's. On Monday's take 5 mg (1 tablet).) 30 tablet 0 07/17/2022 at 2000   feeding supplement (ENSURE ENLIVE / ENSURE PLUS) LIQD Take 237 mLs by mouth 2 (two) times daily between meals.  (Patient not taking: Reported on 07/18/2022) 14220 mL 0 Not Taking    Assessment: 71 yo male on warfarin PTA for recent LV thrombus. He is also noted with ESRD and s/p emergent HD.  Plans are for possible CABG and pharmacy to start heparin when INR is  < 2.0 -INR= 2.1, hg= 8  Goal of Therapy:  Heparin level 0.3-0.7 units/ml Monitor platelets by anticoagulation protocol: Yes   Plan:  -Will start heparin when INR < 2.0 -Daily PT/INR  Hildred Laser, PharmD Clinical Pharmacist **Pharmacist phone directory can now be found on [email or web address] ([email or web address]).  Listed under [place].

## 2022-07-19 NOTE — Progress Notes (Signed)
Received patient in bed to unit.  Alert and oriented.  Informed consent signed and in chart.   Treatment initiated: 1349 Treatment completed: 1850  07/19/22 1850  Vitals  Temp 97.6 F (36.4 C)  Temp Source Oral  BP Location Left Arm  BP Method Automatic  Patient Position (if appropriate) Lying  Pulse Rate Source Monitor  ECG Heart Rate 95  Resp (!) 22  Oxygen Therapy  SpO2 100 %  O2 Device Nasal Cannula  O2 Flow Rate (L/min) 3 L/min  During Treatment Monitoring  Intra-Hemodialysis Comments Tx completed     Patient tolerated well.  Transported back to the room  Alert, without acute distress.  Hand-off given to patient's nurse.   Access used: Catheter Access issues: n/a  Total UF removed: 2L Medication(s) given: N/A       [facility]

## 2022-07-19 NOTE — Evaluation (Signed)
Physical Therapy Evaluation Patient Details Name: Allen Cortez MRN: [id number] DOB: 1951-08-13 Today's Date: 07/19/2022  History of Present Illness  Pt is 71 yo male who preented to ED on 07/18/22 with SOB and LE edema.  PMH: ESRD on HD, CHF, CAD, AAA repair, HTN, HLD, DM2, OSA, GERD, renal cell carcinoma with partial L nephrectomy, CKD3, abdominal wall mass, depression.  Clinical Impression  Pt admitted with above diagnosis. Pt known to this PT from last admission in September. Since that time he has been very limited with daily mobility by SOB and continues to have LE pain when ambulating. Pt ambulated 7' with rollator, 2x, needing seated rest break in between. Pt remained on 4L O2 and SPO2 in 90's but pt with gray pallor to face and reports that he feels like he cannot breathe while ambulating. He was tolerating further distances than this on last admission.  Pt currently with functional limitations due to the deficits listed below (see PT Problem List). Pt will benefit from skilled PT to increase their independence and safety with mobility to allow discharge to the venue listed below.          Recommendations for follow up therapy are one component of a multi-disciplinary discharge planning process, led by the attending physician.  Recommendations may be updated based on patient status, additional functional criteria and insurance authorization.  Follow Up Recommendations Home health PT      Assistance Recommended at Discharge PRN  Patient can return home with the following  Help with stairs or ramp for entrance;Assistance with cooking/housework    Equipment Recommendations None recommended by PT  Recommendations for Other Services       Functional Status Assessment Patient has had a recent decline in their functional status and demonstrates the ability to make significant improvements in function in a reasonable and predictable amount of time.     Precautions / Restrictions  Precautions Precautions: Other (comment) Precaution Comments: O2 sats Restrictions Weight Bearing Restrictions: No      Mobility  Bed Mobility Overal bed mobility: Independent                  Transfers Overall transfer level: Needs assistance Equipment used: Rolling walker (2 wheels) Transfers: Sit to/from Stand Sit to Stand: Supervision           General transfer comment: close supervision given due to pt SOB with all activity and face with gray pallor when mobilizing    Ambulation/Gait Ambulation/Gait assistance: Supervision Gait Distance (Feet): 14 Feet (7', 7') Assistive device: Rollator (4 wheels) Gait Pattern/deviations: Step-through pattern Gait velocity: decreased Gait velocity interpretation: <1.8 ft/sec, indicate of risk for recurrent falls   General Gait Details: pt needed seated rest break after 7'. Kept on 4L O2 with sats in 90's throughout but symptomatically pt feels like he can't breathe  Stairs            Wheelchair Mobility    Modified Rankin (Stroke Patients Only)       Balance Overall balance assessment: No apparent balance deficits (not formally assessed)                                           Pertinent Vitals/Pain Pain Assessment Pain Assessment: Faces Faces Pain Scale: Hurts little more Pain Location: B thighs, knees, calves Pain Descriptors / Indicators: Aching, Sore Pain Intervention(s): Limited activity within patient's  tolerance, Monitored during session    Home Living Family/patient expects to be discharged to:: Private residence Living Arrangements: Spouse/significant other Available Help at Discharge: Family;Available 24 hours/day Type of Home: House Home Access: Stairs to enter Entrance Stairs-Rails: Right;Left;Can reach both Entrance Stairs-Number of Steps: 3   Home Layout: One level Home Equipment: 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 (2 wheels) Additional Comments: lives with  wife, she is independent    Prior Function Prior Level of Function : Needs assist             Mobility Comments: was independent before admission in 9/23 but since then has had diffciulty with getting around house and self care because of SOB       Hand Dominance   Dominant Hand: Right    Extremity/Trunk Assessment   Upper Extremity Assessment Upper Extremity Assessment: Overall WFL for tasks assessed    Lower Extremity Assessment Lower Extremity Assessment: Generalized weakness    Cervical / Trunk Assessment Cervical / Trunk Assessment: Normal  Communication   Communication: No difficulties  Cognition Arousal/Alertness: Awake/alert Behavior During Therapy: WFL for tasks assessed/performed Overall Cognitive Status: Within Functional Limits for tasks assessed                                          General Comments General comments (skin integrity, edema, etc.): wife present end of session    Exercises     Assessment/Plan    PT Assessment Patient needs continued PT services  PT Problem List Decreased strength;Decreased activity tolerance;Decreased mobility;Decreased knowledge of use of DME;Decreased knowledge of precautions;Pain;Cardiopulmonary status limiting activity       PT Treatment Interventions DME instruction;Gait training;Stair training;Functional mobility training;Therapeutic activities;Therapeutic exercise;Patient/family education    PT Goals (Current goals can be found in the Care Plan section)  Acute Rehab PT Goals Patient Stated Goal: return home PT Goal Formulation: With patient Time For Goal Achievement: 08/02/22 Potential to Achieve Goals: Good    Frequency Min 3X/week     Co-evaluation               AM-PAC PT "6 Clicks" Mobility  Outcome Measure Help needed turning from your back to your side while in a flat bed without using bedrails?: None Help needed moving from lying on your back to sitting on the side of  a flat bed without using bedrails?: None Help needed moving to and from a bed to a chair (including a wheelchair)?: None Help needed standing up from a chair using your arms (e.g., wheelchair or bedside chair)?: A Little Help needed to walk in hospital room?: A Little Help needed climbing 3-5 steps with a railing? : A Lot 6 Click Score: 20    End of Session Equipment Utilized During Treatment: Oxygen Activity Tolerance: Patient limited by fatigue Patient left: in bed;with call bell/phone within reach;with family/visitor present Nurse Communication: Mobility status PT Visit Diagnosis: Pain;Difficulty in walking, not elsewhere classified (R26.2) Pain - part of body: Leg    Time: 4481-8563 PT Time Calculation (min) (ACUTE ONLY): 19 min   Charges:   PT Evaluation $PT Eval Moderate Complexity: [place], PT  Acute Rehab Services Secure chat preferred Office Reader 07/19/2022, 1:49 PM

## 2022-07-19 NOTE — Evaluation (Signed)
Occupational Therapy Evaluation Patient Details Name: Allen Cortez MRN: [id number] DOB: November 13, 1950 Today's Date: 07/19/2022   History of Present Illness Pt is 71 yo male who preented to ED on 07/18/22 with SOB and LE edema.  PMH: ESRD on HD, CHF, CAD, AAA repair, HTN, HLD, DM2, OSA, GERD, renal cell carcinoma with partial L nephrectomy, CKD3, abdominal wall mass, depression.   Clinical Impression   [name] was evaluated s/p the above admission list, he is typically indep at baseline but shares that he has had increased difficulty with mobility and ADLs since recent admission in 06/2022. Upon evaluation pt had functional limitations due to generalized weakness, increased SOB, and poor activity tolerance. Overall he was min G for simple transfers and short mobility with RW. Due ot deficits listed below he also requires up to min A for ADLs. Pt unable to tolerate supine position or standing ADLs due to SOB. Pt on 4L with SpO2 >90%. OT to continue to follow acutely. Recommend d/c to home with [place].      Recommendations for follow up therapy are one component of a multi-disciplinary discharge planning process, led by the attending physician.  Recommendations may be updated based on patient status, additional functional criteria and insurance authorization.   Follow Up Recommendations  Home health OT    Assistance Recommended at Discharge Frequent or constant Supervision/Assistance  Patient can return home with the following A little help with walking and/or transfers;A little help with bathing/dressing/bathroom;Assistance with cooking/housework;Direct supervision/assist for medications management;Direct supervision/assist for financial management;Assist for transportation;Help with stairs or ramp for entrance    Functional Status Assessment  Patient has had a recent decline in their functional status and demonstrates the ability to make significant improvements in function in a reasonable and  predictable amount of time.  Equipment Recommendations  Other (comment);BSC/3in1 (rollator)    Recommendations for Other Services       Precautions / Restrictions Precautions Precautions: Other (comment) Precaution Comments: O2 sats Restrictions Weight Bearing Restrictions: No      Mobility Bed Mobility Overal bed mobility: Independent                  Transfers Overall transfer level: Needs assistance Equipment used: Rolling walker (2 wheels) Transfers: Sit to/from Stand Sit to Stand: Min guard                  Balance Overall balance assessment: No apparent balance deficits (not formally assessed)               ADL either performed or assessed with clinical judgement   ADL Overall ADL's : Needs assistance/impaired Eating/Feeding: Independent;Sitting   Grooming: Set up;Sitting   Upper Body Bathing: Supervision/ safety;Set up;Sitting   Lower Body Bathing: Minimal assistance;Sit to/from stand   Upper Body Dressing : Set up;Sitting   Lower Body Dressing: Minimal assistance;Sit to/from stand   Toilet Transfer: Min guard;Ambulation;BSC/3in1   Toileting- Clothing Manipulation and Hygiene: Supervision/safety;Sitting/lateral lean       Functional mobility during ADLs: Min guard;Rolling walker (2 wheels) General ADL Comments: significantly increased time needed for energy conservation and SOB     Vision Baseline Vision/History: 1 Wears glasses Vision Assessment?: No apparent visual deficits            Pertinent Vitals/Pain Pain Assessment Pain Assessment: Faces Faces Pain Scale: Hurts a little bit Pain Location: generalized with movement Pain Descriptors / Indicators: Aching, Sore Pain Intervention(s): Limited activity within patient's tolerance, Monitored during session     Hand  Dominance Right   Extremity/Trunk Assessment Upper Extremity Assessment Upper Extremity Assessment: Generalized weakness   Lower Extremity  Assessment Lower Extremity Assessment: Generalized weakness   Cervical / Trunk Assessment Cervical / Trunk Assessment: Normal   Communication Communication Communication: No difficulties   Cognition Arousal/Alertness: Awake/alert Behavior During Therapy: Flat affect Overall Cognitive Status: Within Functional Limits for tasks assessed                 General Comments: anxious for movement due to SOB     General Comments  VSS on 2L, wife present     Home Living Family/patient expects to be discharged to:: Private residence Living Arrangements: Spouse/significant other Available Help at Discharge: Family;Available 24 hours/day Type of Home: House Home Access: Stairs to enter [employer] of Steps: 3 Entrance Stairs-Rails: Right;Left;Can reach both Home Layout: One level     Bathroom Shower/Tub: Tub/shower unit;Walk-in shower   Bathroom Toilet: Handicapped height     Home Equipment: 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 Active with HHPT ADLs Comments: sponge bathing, requiring increased assist for ADLs since last admission        OT Problem List: Cardiopulmonary status limiting activity;Decreased strength;Decreased range of motion;Decreased activity tolerance;Impaired balance (sitting and/or standing);Decreased safety awareness;Decreased knowledge of use of DME or AE      OT Treatment/Interventions: Self-care/ADL training;Therapeutic exercise;DME and/or AE instruction;Therapeutic activities;Patient/family education;Balance training    OT Goals(Current goals can be found in the care plan section) Acute Rehab OT Goals Patient Stated Goal: to  feel better OT Goal Formulation: With patient Time For Goal Achievement: 08/02/22 Potential to Achieve Goals: Good ADL Goals Pt Will Perform Grooming: with modified independence;standing Pt Will Perform Lower Body Dressing: with modified independence;sit to/from stand Pt Will Transfer to Toilet: with modified independence;ambulating Additional ADL Goal #1: pt will tolerate at least 5 minutes of standing funcitonal activity without sitting rest break Additional ADL Goal #2: Pt will indep recall at least 3 energy conservation strategies to apply to the home setting at d/c.  OT Frequency: Min 2X/week       AM-PAC OT "6 Clicks" Daily Activity     Outcome Measure Help from another person eating meals?: None Help from another person taking care of personal grooming?: A Little Help from another person toileting, which includes using toliet, bedpan, or urinal?: A Little Help from another person bathing (including washing, rinsing, drying)?: A Little Help from another person to put on and taking off regular upper body clothing?: A Little Help from another person to put on and taking off regular lower body clothing?: A Little 6 Click Score: 19   End of Session Equipment Utilized During Treatment: Gait belt;Oxygen;Rolling walker (2 wheels) Nurse Communication: Mobility status  Activity Tolerance: Patient tolerated treatment well Patient left: in bed;with call bell/phone within reach;with family/visitor present  OT Visit Diagnosis: Unsteadiness on feet (R26.81);Other abnormalities of gait and mobility (R26.89);Muscle weakness (generalized) (M62.81)                Time: 1093-2355 OT Time Calculation (min): 16 min Charges:  OT Evaluation $OT Eval Low Complexity: 1 Low   Aldona Bar D Causey  07/19/2022, 3:15 PM

## 2022-07-19 NOTE — Discharge Instructions (Signed)
Taste changes Try increasing the flavor of foods with herbs, spices, seasoning blends, marinades, and sauces Season foods with tart flavors such as citrus fruits, vinegar-based dressings, and pickled foods. You may need to avoid these foods if you are experiencing sore mouth or throat. Add sugar to improve the flavor of salty foods Add salt to decrease the sweetness of sugary foods Use lemon juice on foods to mask metallic taste Use plastic utensils if you experience a metallic taste Practice good oral hygiene Rinse mouth with baking soda and saltwater rinse (1 quart water mixed with  teaspoon of salt and 1 teaspoon of baking soda) before and after meals to cleanse the tastebuds

## 2022-07-19 NOTE — Progress Notes (Signed)
[facility] Progress Note   Subjective:   Patient seen and examined at bedside.  Reports breathing better this AM following dialysis overnight.  Denies CP, orthopnea, abdominal pain, n/v/d, melena and hematochezia.    Objective Vitals:   07/19/22 0224 07/19/22 0346 07/19/22 0448 07/19/22 0741  BP:  (!) 157/90 137/81 136/80  Pulse:  87 82 80  Resp:  (!) 21 (!) 24 19  Temp:  98.5 F (36.9 C) 98.2 F (36.8 C) 98 F (36.7 C)  TempSrc:  Oral Axillary Oral  SpO2:  97% 92% 96%  Weight: 68.6 kg     Height: '5\' 10"'$  (1.778 m)      Physical Exam General:chronically ill appearing male in NAD Heart:RRR, no mrg Lungs:+crackles in RLL, +scattered wheezing, nml WOB on 4L O2 via [place] Abdomen:soft, NTND Extremities:no LE edema Dialysis Access: [facility]   Filed Weights   07/19/22 0224  Weight: 68.6 kg    Intake/Output Summary (Last 24 hours) at 07/19/2022 0809 Last data filed at 07/19/2022 0120 Gross per 24 hour  Intake --  Output 2100 ml  Net -2100 ml    Additional Objective Labs: Basic Metabolic Panel: Recent Labs  Lab 07/18/22 1653 07/18/22 2046 07/19/22 0454  NA 137 135  --   K 4.1 4.1  --   CL 97*  --   --   CO2 28  --   --   GLUCOSE 130*  --   --   BUN 51*  --   --   CREATININE 6.05*  --   --   CALCIUM 8.6*  --   --   PHOS  --   --  3.2   Liver Function Tests: Recent Labs  Lab 07/18/22 1653 07/18/22 2045  AST 18 19  ALT 11 11  ALKPHOS 68 59  BILITOT 0.5 0.7  PROT 6.2* 6.3*  ALBUMIN 2.7* 2.7*   CBC: Recent Labs  Lab 07/18/22 1653 07/18/22 2046 07/19/22 0454  WBC 7.8  --  8.3  NEUTROABS 5.1  --   --   HGB 6.6* 7.1* 8.0*  HCT 21.2* 21.0* 24.4*  MCV 99.1  --  96.1  PLT 246  --  204   Cardiac Enzymes: Recent Labs  Lab 07/18/22 2045  CKTOTAL 106   CBG: Recent Labs  Lab 07/18/22 2046 07/19/22 0236 07/19/22 0744  GLUCAP 113* 114* 100*   Iron Studies:  Recent Labs    07/18/22 2045  IRON 45  TIBC 238*  FERRITIN 185   Lab  Results  Component Value Date   INR 2.1 (H) 07/19/2022   INR 2.3 (H) 07/18/2022   INR 3.5 (A) 07/13/2022   Studies/Results: DG Chest 2 View  Result Date: 07/18/2022 CLINICAL DATA:  Shortness of breath. Bilateral leg swelling for several days. EXAM: CHEST - 2 VIEW COMPARISON:  07/08/2022 FINDINGS: Mildly enlarged cardiac silhouette with an interval increase in size. Interval increased prominence of the interstitial markings and increased patchy densities in both lungs. Small bilateral pleural effusions. Stable right jugular double-lumen catheter with its tip in the upper right atrium. Unremarkable bones. IMPRESSION: 1. Worsening changes of congestive heart failure. 2. Small bilateral pleural effusions. 3. Mild cardiomegaly. Electronically Signed   By: Claudie Revering M.D.   On: 07/18/2022 17:45    Medications:  sodium chloride      sodium chloride   Intravenous Once   atorvastatin  40 mg Oral QHS   Chlorhexidine Gluconate Cloth  6 each Topical Q0600   clonazePAM  0.5 mg Oral QHS   guaiFENesin  600 mg Oral BID    HYDROmorphone (DILAUDID) injection  0.5 mg Intravenous Once   insulin aspart  0-6 Units Subcutaneous Q4H   QUEtiapine  300 mg Oral QHS   sodium chloride flush  3 mL Intravenous Q12H    Dialysis Orders: [place] 4hr TTS EDW presumably 70kg 3/2.25, NO UFP Heparin 2K Units Mircera 50 q2weeks (last given 10/14)  Background: 71 y.o. 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 Patient placed at [facility] TTS with last treatment on Saturday after receiving a cardiac MRI on Friday. He  is presenting with worsening shortness of breath, orthopnea, poor exercise tolerance; he denies fever, chills, cough, sore throat. He also denies NSAID use, excessive fluid intake (actually not eating or drinking very much); he has decreased urine output and does not feel full after voiding. He also denies tarry stool, blood in the stool or vomiting blood. In the ED his BUN/Cr was 51/6.05 with a potassium of 4.1 and HCO3 of 28, Hb 6.6 with a BNP of 2400.  CXR showed Interval increased prominence of the interstitial markings and increased patchy densities in both lungs. Small bilateral pleural effusions. BP was 122-150/77-90 with a HR in the 80-90's range   Assessment/Plan: Acute hypoxic resp failure - likely related to volume overload. CXR w/CHF and b/l pleural effusions.  HD overnight w/net UF 2L, plan for HD again today for volume removal.   AKI on CKD 3b - baseline creatinine 1.6 prior to recent heart cath.   2 days post cath (creat 2.2) and from 9/5 in ED here (creat 3.3) are not entirely consistent w/ a typical post-contrast ATN / AKI pattern however cholesterol embolic injury is possible. CES not likely to improve but CIN/ATN may.  Prior renal US c/w CKD, simple cysts. [place] placed on and 1st HD on 9/18. - Emergent HD completed overnight with net UF 2.1L.  HD scheduled again for today per regular schedule.  -Hx severe leg pain with dialysis. Order for Dilaudid 0.5 before HD and UF profile #2. At outpt HD he actually takes Oxycodone prescribed by cardiology for the leg pain per family.  HTN - home regimen hydralazine, coreg and imdur; BP in goal w/o antihypertensives.  On Lasix 40 BID at home but not voiding much; strict I&O's. CAD - sp LHC on 8/22 at [facility] as above and seen by CTS. MRI last Friday and may need CABG with total occlusion in the LAD.  He may not recover renal function and may need to proceed with CABG with the designation of ESRD.  CTS consulted.  A/C systolic CHF - echo with EF 30-35%.   Meds limited by kidney function.  Volume removal with HD.  Anemia-  Hgb 8.0 s/p 1 unit pRBC. Tsat 19%, start iron load.  ESA recently dosed.  Bones-  CCa and phos in goal.  Not on binders or VDRA.  Nutrition - Currently on heart healthy/carb modified diet.  Add fluid restrictions. Alb 2.7 - start protein supplements.   Jen Mow, PA-C [facility] 07/19/2022,8:09 AM  LOS:  1 day

## 2022-07-19 NOTE — Progress Notes (Addendum)
Progress Note  Patient: Allen Cortez URK:[id number] DOB: [birth year]  DOA: 07/18/2022  DOS: 07/19/2022    Brief hospital course: Allen Cortez is a 71 y.o. male with a history of CAD, chronic HFrEF (LVEF 30-35%), LV thrombus on coumadin, RCC s/p partial left nephrectomy, recent AKI post cardiac catheterization now on hemodialysis TTS, IDA, T2DM, OSA, chronic hypoxic respiratory failure who presented to the ED 10/16 with shortness of breath, orthopnea found to have acute on chronic hypoxic respiratory failure, pulmonary edema on CXR, admitted and taken for urgent hemodialysis with plan for repeat session 10/17.   Assessment and Plan: Acute on chronic hypoxic respiratory failure due to acute on chronic combined HFrEF:  - Continue supplemental oxygen as needed to maintain normal respiratory effort and SpO2 >90%.  - Managing volume with HD (10/16 urgently, repeat 10/17) still with symptoms. GDMT per cardiology including coreg, hydralazine.   AKI on stage IIIb CKD, suspected ESRD: s/p catheterization concerning for ATN and/or cholesterol emboli.  - Continue HD per nephrology.   Multivessel CAD with demand myocardial ischemia: Noted on recent cardiac catheterization. Tn flat 266 > 271, no acute ST changes on ECG. - Has cardiothoracic surgery follow up scheduled with Dr. Kipp Brood 10/20. They have been consulted this admission. - Cardiology consulted, continue ASA, statin, BB  LV thrombus:  - IV heparin once INR < 2, pending CT surgery evaluation. Hold coumadin.   Iron deficiency anemia:  - s/p 1u PRBCs 10/16 - ESA and iron, hgb stable, will monitor  T2DM: HbA1c 6.1% - SSI  HLD:  - Continue statin  AAA: s/p remote repair. Last check was 4.2cm.   Moderate malnutrition: Supplement  PAD: No changes  Subjective: Reports persistent dyspnea worse with exertion but most when laying flat.  Objective: Vitals:   07/19/22 1542 07/19/22 1549 07/19/22 1629 07/19/22 1700  BP: 127/87 (!)  134/91 121/76 117/71  Pulse: 85 86 82 89  Resp: 20 (!) 27 (!) 26 (!) 23  Temp: 98.3 F (36.8 C)     TempSrc: Oral     SpO2: 98% 99% 98% 98%  Weight: 68.6 kg     Height:       Gen: Chronically ill-appearing, unkempt 71 y.o. male in no distress Pulm: Tachypneic on 4L O2 with crackles, no wheezes.  CV: Regular rate and rhythm. Soft systolic murmur, no rub, or gallop. No JVD, no dependent edema. GI: Abdomen soft, non-tender, non-distended, with normoactive bowel sounds.  Ext: Warm, no deformities Skin: No acute rashes, lesions or ulcers on visualized skin. TDC c/d/i Neuro: Alert and oriented. No focal neurological deficits. Psych: Judgement and insight appear fair. Mood euthymic & affect congruent. Behavior is appropriate.    Data Personally reviewed: CBC: Recent Labs  Lab 07/18/22 1653 07/18/22 2046 07/19/22 0454  WBC 7.8  --  8.3  NEUTROABS 5.1  --   --   HGB 6.6* 7.1* 8.0*  HCT 21.2* 21.0* 24.4*  MCV 99.1  --  96.1  PLT 246  --  831   Basic Metabolic Panel: Recent Labs  Lab 07/18/22 1653 07/18/22 2046 07/19/22 0454  NA 137 135  --   K 4.1 4.1  --   CL 97*  --   --   CO2 28  --   --   GLUCOSE 130*  --   --   BUN 51*  --   --   CREATININE 6.05*  --   --   CALCIUM 8.6*  --   --  PHOS  --   --  3.2   GFR: Estimated Creatinine Clearance: 10.7 mL/min (A) (by C-G formula based on SCr of 6.05 mg/dL (H)). Liver Function Tests: Recent Labs  Lab 07/18/22 1653 07/18/22 2045  AST 18 19  ALT 11 11  ALKPHOS 68 59  BILITOT 0.5 0.7  PROT 6.2* 6.3*  ALBUMIN 2.7* 2.7*   No results for input(s): "LIPASE", "AMYLASE" in the last 168 hours. No results for input(s): "AMMONIA" in the last 168 hours. Coagulation Profile: Recent Labs  Lab 07/13/22 0000 07/18/22 1915 07/19/22 0454  INR 3.5* 2.3* 2.1*   Cardiac Enzymes: Recent Labs  Lab 07/18/22 2045  CKTOTAL 106   BNP (last 3 results) No results for input(s): "PROBNP" in the last 8760 hours. HbA1C: No results for  input(s): "HGBA1C" in the last 72 hours. CBG: Recent Labs  Lab 07/18/22 2046 07/19/22 0236 07/19/22 0744 07/19/22 1225  GLUCAP 113* 114* 100* 105*   Lipid Profile: No results for input(s): "CHOL", "HDL", "LDLCALC", "TRIG", "CHOLHDL", "LDLDIRECT" in the last 72 hours. Thyroid Function Tests: Recent Labs    07/18/22 2040  TSH 1.676   Anemia Panel: Recent Labs    07/18/22 2045 07/19/22 0454  VITAMINB12  --  1,131*  FOLATE  --  35.4  FERRITIN 185  --   TIBC 238*  --   IRON 45  --   RETICCTPCT  --  3.9*   Urine analysis:    Component Value Date/Time   COLORURINE YELLOW 06/12/2022 1720   APPEARANCEUR CLEAR 06/12/2022 1720   LABSPEC 1.010 06/12/2022 1720   PHURINE 5.0 06/12/2022 1720   GLUCOSEU NEGATIVE 06/12/2022 1720   HGBUR SMALL (A) 06/12/2022 1720   BILIRUBINUR NEGATIVE 06/12/2022 1720   KETONESUR NEGATIVE 06/12/2022 1720   PROTEINUR 30 (A) 06/12/2022 1720   UROBILINOGEN 0.2 12/23/2009 0857   NITRITE NEGATIVE 06/12/2022 1720   LEUKOCYTESUR NEGATIVE 06/12/2022 1720   Recent Results (from the past 240 hour(s))  Resp Panel by RT-PCR (Flu A&B, Covid) Anterior Nasal Swab     Status: None   Collection Time: 07/18/22  4:28 PM   Specimen: Anterior Nasal Swab  Result Value Ref Range Status   SARS Coronavirus 2 by RT PCR NEGATIVE NEGATIVE Final    Comment: (NOTE) SARS-CoV-2 target nucleic acids are NOT DETECTED.  The SARS-CoV-2 RNA is generally detectable in upper respiratory specimens during the acute phase of infection. The lowest concentration of SARS-CoV-2 viral copies this assay can detect is 138 copies/mL. A negative result does not preclude SARS-Cov-2 infection and should not be used as the sole basis for treatment or other patient management decisions. A negative result may occur with  improper specimen collection/handling, submission of specimen other than nasopharyngeal swab, presence of viral mutation(s) within the areas targeted by this assay, and  inadequate number of viral copies(<138 copies/mL). A negative result must be combined with clinical observations, patient history, and epidemiological information. The expected result is Negative.  Fact Sheet for Patients:  EntrepreneurPulse.com.au  Fact Sheet for Healthcare Providers:  IncredibleEmployment.be  This test is no t yet approved or cleared by the [place] FDA and  has been authorized for detection and/or diagnosis of SARS-CoV-2 by FDA under an Emergency Use Authorization (EUA). This EUA will remain  in effect (meaning this test can be used) for the duration of the COVID-19 declaration under Section 564(b)(1) of the Act, 21 U.S.C.section 360bbb-3(b)(1), unless the authorization is terminated  or revoked sooner.       Influenza  A by PCR NEGATIVE NEGATIVE Final   Influenza B by PCR NEGATIVE NEGATIVE Final    Comment: (NOTE) The Xpert Xpress SARS-CoV-2/FLU/RSV plus assay is intended as an aid in the diagnosis of influenza from Nasopharyngeal swab specimens and should not be used as a sole basis for treatment. Nasal washings and aspirates are unacceptable for Xpert Xpress SARS-CoV-2/FLU/RSV testing.  Fact Sheet for Patients: EntrepreneurPulse.com.au  Fact Sheet for Healthcare Providers: IncredibleEmployment.be  This test is not yet approved or cleared by the [place] FDA and has been authorized for detection and/or diagnosis of SARS-CoV-2 by FDA under an Emergency Use Authorization (EUA). This EUA will remain in effect (meaning this test can be used) for the duration of the COVID-19 declaration under Section 564(b)(1) of the Act, 21 U.S.C. section 360bbb-3(b)(1), unless the authorization is terminated or revoked.  Performed at [facility], [place] [address], [place], [place] [address]      DG Chest 2 View  Result Date: 07/18/2022 CLINICAL DATA:  Shortness of breath. Bilateral  leg swelling for several days. EXAM: CHEST - 2 VIEW COMPARISON:  07/08/2022 FINDINGS: Mildly enlarged cardiac silhouette with an interval increase in size. Interval increased prominence of the interstitial markings and increased patchy densities in both lungs. Small bilateral pleural effusions. Stable right jugular double-lumen catheter with its tip in the upper right atrium. Unremarkable bones. IMPRESSION: 1. Worsening changes of congestive heart failure. 2. Small bilateral pleural effusions. 3. Mild cardiomegaly. Electronically Signed   By: Claudie Revering M.D.   On: 07/18/2022 17:45    Family Communication: None at bedside  Disposition: Status is: Inpatient Remains inpatient appropriate because: Continued respiratory failure requiring serial dialysis Planned Discharge Destination: Home      Patrecia Pour, MD 07/19/2022 5:25 PM Page by [email or web address]

## 2022-07-19 NOTE — Progress Notes (Signed)
Initial Nutrition Assessment  DOCUMENTATION CODES:   Not applicable  INTERVENTION:   Renal Multivitamin w/ minerals daily Nepro Shake po TID, each supplement provides 425 kcal and 19 grams protein Liberalize pt diet to regular due to poor appetite and ongoing weight loss.  Encourage good PO intake  Tips to help with taste changes in AVS  NUTRITION DIAGNOSIS:   Increased nutrient needs related to chronic illness (CHF, ESRD) as evidenced by estimated needs.  GOAL:   Patient will meet greater than or equal to 90% of their needs  MONITOR:   PO intake, Supplement acceptance, Labs, I & O's, Weight trends  REASON FOR ASSESSMENT:   Consult Assessment of nutrition requirement/status  ASSESSMENT:   71 y.o. male presented to the ED with shortness of breath and BLE swelling. PMH includes ESRD on HD (TTS), CAD, CHF, T2DM,  GERD, MDD, and NSTEMI. Pt admitted with acute on chronic CHF due to fluid overload.   Met with pt and wife in room. Discussed that pt has had a terrible appetite for some time now. States that he has had some taste changes with dialysis. RD to add handout to provide tips with that. Reports that he is drinking 2 Nepro shakes per day in place of food. Both endorse a significant amount of weight loss. Pt current weight is below EDW.  Discussed that RD will order Nepro shakes here. Pt to try additional flavors. RN asked to [employer] as pt does not like.  Medications reviewed and include: NovoLog SSI, IV Ferrlecit Labs reviewed.   HD on 10/17 Net UF: 2.1 L EDW: 70 kg  NUTRITION - FOCUSED PHYSICAL EXAM:  Deferred to follow-up due to therapy coming before HD.   Diet Order:   Diet Order             Diet regular Room service appropriate? Yes with Assist; Fluid consistency: Thin; Fluid restriction: 1500 mL Fluid  Diet effective now                   EDUCATION NEEDS:   Education needs have been addressed  Skin:  Skin Assessment: Reviewed RN  Assessment  Last BM:  Unknown  Height:   Ht Readings from Last 1 Encounters:  07/19/22 _0  (1.778 m)    Weight:   Wt Readings from Last 1 Encounters:  07/19/22 68.6 kg    Ideal Body Weight:  75.5 kg  BMI:  Body mass index is 21.7 kg/m.  Estimated Nutritional Needs:   Kcal:  2000-2200  Protein:  100-120 grams  Fluid:  UOP + 1 L    Hermina Barters RD, LDN Clinical Dietitian See [facility] for contact information.

## 2022-07-19 NOTE — Progress Notes (Addendum)
Rounding Note    Patient Name: Allen Cortez Date of Encounter: 07/19/2022  [place] Cardiologist: Shelva Majestic, MD   Subjective   Breathing better this morning. No chest pain. Planned for another HD session today.  Still feels terrible.  Has orthopnea, but was able to lie down a little bit today and sleep some-was sleeping when I saw him.  Inpatient Medications    Scheduled Meds:  (feeding supplement) PROSource Plus  30 mL Oral BID BM   sodium chloride   Intravenous Once   atorvastatin  40 mg Oral QHS   Chlorhexidine Gluconate Cloth  6 each Topical Q0600   clonazePAM  0.5 mg Oral QHS   guaiFENesin  600 mg Oral BID    HYDROmorphone (DILAUDID) injection  0.5 mg Intravenous Once   insulin aspart  0-6 Units Subcutaneous Q4H   QUEtiapine  300 mg Oral QHS   sodium chloride flush  3 mL Intravenous Q12H   Continuous Infusions:  sodium chloride     ferric gluconate (FERRLECIT) IVPB     PRN Meds: sodium chloride, acetaminophen **OR** acetaminophen, albuterol, sodium chloride flush   Vital Signs    Vitals:   07/19/22 0224 07/19/22 0346 07/19/22 0448 07/19/22 0741  BP:  (!) 157/90 137/81 136/80  Pulse:  87 82 80  Resp:  (!) 21 (!) 24 19  Temp:  98.5 F (36.9 C) 98.2 F (36.8 C) 98 F (36.7 C)  TempSrc:  Oral Axillary Oral  SpO2:  97% 92% 96%  Weight: 68.6 kg     Height: '5\' 10"'$  (1.778 m)       Intake/Output Summary (Last 24 hours) at 07/19/2022 0851 Last data filed at 07/19/2022 0120 Gross per 24 hour  Intake --  Output 2100 ml  Net -2100 ml      07/19/2022    2:24 AM 07/08/2022    8:50 AM 06/29/2022    2:53 PM  Last 3 Weights  Weight (lbs) 151 lb 3.8 oz 156 lb 9.6 oz 156 lb  Weight (kg) 68.6 kg 71.033 kg 70.761 kg      Telemetry    Sinus Rhythm, PVCs - Personally Reviewed  ECG    Sinus Rhythm, 83 bpm Q waves in inferior leads, LVH - Personally Reviewed  Physical Exam   GEN: No acute distress. Older male, wearing [place]   Neck: No  JVD Cardiac: RRR, soft systolic murmur, no rubs, or gallops.  Respiratory: Clear to auscultation bilaterally. GI: Soft, nontender, non-distended  MS: No edema; No deformity. Neuro:  Nonfocal  Psych: Normal affect   Labs    High Sensitivity Troponin:   Recent Labs  Lab 07/18/22 1653 07/18/22 1845 07/18/22 2045 07/19/22 0454  TROPONINIHS 266* 271* 256* 288*     Chemistry Recent Labs  Lab 07/18/22 1653 07/18/22 2045 07/18/22 2046  NA 137  --  135  K 4.1  --  4.1  CL 97*  --   --   CO2 28  --   --   GLUCOSE 130*  --   --   BUN 51*  --   --   CREATININE 6.05*  --   --   CALCIUM 8.6*  --   --   PROT 6.2* 6.3*  --   ALBUMIN 2.7* 2.7*  --   AST 18 19  --   ALT 11 11  --   ALKPHOS 68 59  --   BILITOT 0.5 0.7  --   GFRNONAA 9*  --   --  ANIONGAP 12  --   --     Lipids No results for input(s): "CHOL", "TRIG", "HDL", "LABVLDL", "LDLCALC", "CHOLHDL" in the last 168 hours.  Hematology Recent Labs  Lab 07/18/22 1653 07/18/22 2046 07/19/22 0454  WBC 7.8  --  8.3  RBC 2.14*  --  2.54*  1.40*  HGB 6.6* 7.1* 8.0*  HCT 21.2* 21.0* 24.4*  MCV 99.1  --  96.1  MCH 30.8  --  31.5  MCHC 31.1  --  32.8  RDW 16.9*  --  16.1*  PLT 246  --  204   Thyroid  Recent Labs  Lab 07/18/22 2040  TSH 1.676    BNP Recent Labs  Lab 07/18/22 1653  BNP 2,398.5*    DDimer No results for input(s): "DDIMER" in the last 168 hours.   Radiology    DG Chest 2 View  Result Date: 07/18/2022 CLINICAL DATA:  Shortness of breath. Bilateral leg swelling for several days. EXAM: CHEST - 2 VIEW COMPARISON:  07/08/2022 FINDINGS: Mildly enlarged cardiac silhouette with an interval increase in size. Interval increased prominence of the interstitial markings and increased patchy densities in both lungs. Small bilateral pleural effusions. Stable right jugular double-lumen catheter with its tip in the upper right atrium. Unremarkable bones. IMPRESSION: 1. Worsening changes of congestive heart failure.  2. Small bilateral pleural effusions. 3. Mild cardiomegaly. Electronically Signed   By: Claudie Revering M.D.   On: 07/18/2022 17:45    Cardiac Studies   ECHO 06/12/2022:  1. 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 Left ventricular ejection fraction, by estimation, is 30-35%. The left ventricle is abnormal. The left ventricle has focal regional wall motion abnormalities. The left ventricular internal cavity size was mildly dilated. There is akinesis of  the left ventricular, apical septal wall, inferior wall, anterior wall and lateral wall. There is akinesis of the left ventricular, entire apical segment. There is severe hypokinesis of the left ventricular, basal-mid anterior wall. A falst tendon is present in the mid LV cavity.   2. Right ventricular systolic function is normal. The right ventricular size is normal. There is moderately elevated pulmonary artery systolic pressure. The estimated right ventricular systolic pressure is 90.2 mmHg.   3. The mitral valve is normal in structure. Mild mitral valve regurgitation. No evidence of mitral stenosis.   4. The aortic valve is tricuspid. There is moderate calcification of the aortic valve. Aortic valve regurgitation is not visualized. Aortic valve sclerosis/calcification is present, without any evidence of aortic stenosis.   5. The inferior vena cava is dilated in size with >50% respiratory variability, suggesting right atrial pressure of 8 mmHg.      CMR 07/15/2022: 1. Subendocardial LGE consistent with prior infarct in mid anterior/anteroseptal, apical anterior/septal/inferior/lateral walls, and apex. LGE is greater than 50% transmural suggesting nonviability in apical anterior/septal/inferior/lateral walls and apex. LGE <50% transmural in mid anterior wall but wall thickness <4.43m suggests nonviability. Findings are consistent with nonviability in mid  to distal LAD territory 2. Severe LV dilatation with severe systolic dysfunction (EF 240%. Thinning/akinesis of mid to apical anterior/anteroseptal walls and apical inferior/lateral walls. Aneurysm of apex. 3.  LV apical thrombus measures 165mx 56m31m.  Normal RV size and systolic function (EF 48%97%. Diffuse pulmonary opacities and bilateral pleural effusions (moderate on right, small on left), consider CT chest for further evaluation.   -> I wonder  if the films that are actually loaded in his name are the actual films.  I do not see CTO of the LAD and significant circumflex disease on the films that are listed under his name.  I do not doubt the veracity of the report because Dr. Angelena Form himself has reviewed the images and felt to be in agreement with the findings noted here.      Patient Profile     71 y.o. male with a PMHx of nonobstructive CAD 2011, AAA s/p repair 2013, HTN, HLD, T2DM, RCC s/p partial left nephrectomy 2015, OSA, GERD, RLS. who is being seen 07/18/2022 for the evaluation of acute on chronic HFrEF, NSTEMI and known CAD at the request of the primary hospitalist service.  Assessment & Plan    Acute hypoxic respiratory failure ESRD on HD -- presented with significant volume overload in the setting of HFrEF and ESRD. Had HD last evening and planned for additional session today -- nephrology following  HFrEF -ICM => WITH ACUTE ON CHRONIC COMBINED SYSTOLIC AND DIASTOLIC HEART FAILURE -- Echo 06/12/22 LVEF 30-35% with clear evidence of LV thrombus. Akinesis of the anterior wall. No significant valve disease -- volume management per HD -> probably has a few more days of dialysis before he feels better. -- resume coreg 6.'25mg'$  BID, hydralazine '25mg'$  TID -> as pressure increases, would titrate up hydralazine and potentially add Isordil for additional afterload reduction. -- currently no ACE/ARB/ARNi with renal disease -- no room for SGLT2i with ERSD  CAD w/ Angina & Elevated  troponin /Demand Ischemia -type II MI/supply versus demand ischemia from combined CHF, known CAD and ESRD) -- cath through the [place] with severe 2v disease with CTO of the LAD and 80% Lcx. Was awaiting TCTS input regarding options. Outpatient cardiac MRI 10/13 without viable myocardium. Have requested formal TCTS consult -- hsTn 266>>271 in the setting of known CAD with volume overload -- continue ASA, statin, BB -> add Isordil to the hydralazine The cath films that were scanned in for him do not correlate with [facility] findings on report--report indicates occluded LAD and severe disease in the LCx.  Based on result of cardiac CMR, unclear if revascularization of the LAD territory with reasonable, especially if it involves CABG.  Otherwise, would potentially consider the possibility of PCI to the LCx to optimize coronary perfusion. The major symptom he notes with exertion is extreme leg pain if he is not wearing his NCoxygen even walking to the bathroom.  LV thrombus -- diagnosed at the [facility] -- Cardiac MRI + thrombus -- coumadin held, INR 2.1 with plans for heparin once INR <2  HTN -- resume coreg 6.'25mg'$  BID, hydralazine '25mg'$  TID (increase to 50 mg BID + Isordil 20 mg)  HLD -- recent LDL 60 -- continue atrovastatin '40mg'$  daily  AAA s/p remote repair with recurrence -- family reported repeat size last month was 4.2cm and that no specific intervention was felt needed  Significant bilateral leg pain that happens to be get worse with exertion but also at rest when not wearing nasal cannula oxygen.  Not sure if this symptom is related to true claudication or simply just low oxygen levels from heart failure.  He apparently has had lower extremity venous Dopplers, but I do not see them scanned.  Would also have a low threshold to scan both legs for venous and arterial insufficiency.   For questions or updates, please contact [place] Please consult [email or web address] for contact info under  Signed, Reino Bellis, NP  07/19/2022, 8:51 AM     ATTENDING ATTESTATION  I have seen, examined and evaluated the patient this PM on rounds after discussing with Reino Bellis, NP-C .  After reviewing all the available data and chart, we discussed the patients laboratory, study & physical findings as well as symptoms in detail.  I agree with her findings, examination as well as impression recommendations as per our discussion.    Attending adjustments noted in italics.  Significant CAD noted on outside films although on my review of the films I do not see these findings.  The images that I see do not correlate at all with the findings on cardiac MRI.  I think there may be the wrong films added.  He has significant anterior wall infarct on cardiac MRI that is consistent with the reported CTO of the LAD.  With no significant viability, question the benefit for revascularization.  We have asked CVTS to see the patient and provide an opinion.  Otherwise continue to titrate medications. 1 thing to consider would be possible PCI to the LCx if LAD is occluded and nonviable based on Coronary CTA.  BP is still up-may have room to titrate up afterload reduction although with ESRD, question the benefit of ARB/ARNI.  We will plan to use hydralazine-nitrate and then potentially amlodipine for this.  We will continue to titrate his medications and await recommendations from CVTS.   Leonie Man, MD, MS Glenetta Hew, M.D., M.S. Interventional Cardiologist  [place]  Pager # [phone] Phone # [phone] [address] [place], [place] [address]

## 2022-07-19 NOTE — Evaluation (Signed)
Clinical/Bedside Swallow Evaluation Patient Details  Name: Allen Cortez MRN: [id number] Date of Birth: [birth year]  Today's Date: 07/19/2022 Time: SLP Start Time (ACUTE ONLY): 0947 SLP Stop Time (ACUTE ONLY): 1000 SLP Time Calculation (min) (ACUTE ONLY): 13 min  Past Medical History:  Past Medical History:  Diagnosis Date   AAA (abdominal aortic aneurysm) ([place])    Abdominal wall mass of left lower quadrant    Basal cell carcinoma    CAD (coronary artery disease) 2011   moderate   Colon polyp    DDD (degenerative disc disease)    Depression    Diabetes mellitus type II    no meds   GERD (gastroesophageal reflux disease)    Hyperlipidemia    Neuromuscular disorder (HCC)    Osteopenia    PAD (peripheral artery disease) (HCC)    Renal cell carcinoma    Restless leg syndrome    Sleep apnea    used a cpap yr ago-does not snore or use it now.   Past Surgical History:  Past Surgical History:  Procedure Laterality Date   ABDOMINAL AORTIC ANEURYSM REPAIR     HEMORRHOID SURGERY     HERNIA REPAIR  03/06/12   ventral hernia repair   IR FLUORO GUIDE CV LINE RIGHT  06/20/2022   IR US GUIDE VASC ACCESS RIGHT  06/20/2022   NEPHRECTOMY     left due to renal cell carcenoma   VENTRAL HERNIA REPAIR  03/06/2012   Procedure: HERNIA REPAIR VENTRAL ADULT;  Surgeon: Joyice Faster. Cornett, MD;  Location: [facility];  Service: General;  Laterality: N/A;  excision of stitch granuloma   HPI:  Allen Cortez is a 71 y.o. male who presented to the ED with SOB and fatigue.  Prior medical history significant for end-stage renal disease on HD T/TH/Sat, CAD, systolic CHF, DM2, GERD, OSA, malnutrition, on home O2    Assessment / Plan / Recommendation  Clinical Impression  Pt presented with normal oropharyngeal function. Oral mechanism exam normal. No focal deficits. No reports of GERD symptoms - managed with meds per pt. Demonstrated thorough mastication of regular solids; the appearance of a  brisk swallow response; no s/s of aspiration. No dysphagia identified. Continue current diet - no SLP f/u needed. SLP Visit Diagnosis: Dysphagia, unspecified (R13.10)    Aspiration Risk  No limitations    Diet Recommendation   Continue current hh/carb mod diet; thin liquids  Medication Administration: Whole meds with liquid    Other  Recommendations Oral Care Recommendations: Oral care BID    Recommendations for follow up therapy are one component of a multi-disciplinary discharge planning process, led by the attending physician.  Recommendations may be updated based on patient status, additional functional criteria and insurance authorization.  Follow up Recommendations No SLP follow up        [place] Date of Onset: 07/18/22 HPI: Allen Cortez is a 71 y.o. male who presented to the ED with SOB and fatigue.  Prior medical history significant for end-stage renal disease on HD T/TH/Sat, CAD, systolic CHF, DM2, GERD, OSA, malnutrition, on home O2 Type of Study: Bedside Swallow Evaluation Previous Swallow Assessment: no Diet Prior to this Study: Regular;Thin liquids Temperature Spikes Noted: No Respiratory Status: Nasal cannula History of Recent Intubation: No Behavior/Cognition: Alert;Cooperative;Pleasant mood Oral Cavity Assessment: Within Functional Limits Oral Care Completed by SLP: No Oral Cavity - Dentition: Adequate natural dentition Vision: Functional for self-feeding Self-Feeding Abilities: Able to feed self Patient Positioning:  Upright in bed Baseline Vocal Quality: Normal Volitional Cough: Strong Volitional Swallow: Able to elicit    Oral/Motor/Sensory Function Overall Oral Motor/Sensory Function: Within functional limits   Ice Chips Ice chips: Within functional limits   Thin Liquid Thin Liquid: Within functional limits    Nectar Thick Nectar Thick Liquid: Not tested   Honey Thick Honey Thick Liquid: Not tested   Puree Puree: Within functional  limits   Solid    Allen Romero L. Tivis Ringer, MA CCC/SLP Clinical Specialist - Acute Care SLP Acute Rehabilitation Services Office number [phone]  Solid: Within functional limits      Allen Cortez 07/19/2022,10:04 AM

## 2022-07-19 NOTE — TOC Initial Note (Signed)
Transition of Care [facility]) - Initial/Assessment Note    Patient Details  Name: Allen Cortez MRN: [id number] Date of Birth: 1951-08-05  Transition of Care [facility]) CM/SW Contact:    Bethena Roys, RN Phone Number: 07/19/2022, 12:38 PM  Clinical Narrative:  Risk for Readmission Assessment completed. PTA patient was from home with spouse and the plan is to return home once stable. Patient is currently active with [facility] for RN Services. Patient will need resumption orders once stable. Case Manager will follow for PT needs. Patient has DME: rolling walker; hospital bed; lightweight manual wheelchair with seat cushion, and oxygen in the home via [employer]. Oxygen is at 3 [employer] will follow for increased oxygen needs. Patient has a follow up appointment with PCP Dr. Odella AquasOsvaldo Human at the [facility] in December. Patient is a member of the [employer] connected and CSW is Joseph Art @ [phone] ext [phone]. Patient states he receives all medications from the [place] for free. Spouse states that she drives the patient to all appointments. Patient plans to return home once stable- spouse is at the bedside and she will provide transportation home once stable.         Expected Discharge Plan: [place] Barriers to Discharge: Continued Medical Work up   Patient Goals and CMS Choice Patient states their goals for this hospitalization and ongoing recovery are:: to return home with spouse.      Expected Discharge Plan and Services Expected Discharge Plan: [place] In-house Referral: NA Discharge Planning Services: CM Consult Post Acute Care Choice: Home Health, Resumption of Svcs/PTA Provider Living arrangements for the past 2 months: Single Family Home                 DME Arranged: N/A DME Agency: NA       HH Arranged: RN [place] Agency: [place] Date [place]:  07/19/22 Time [place]: 1237 Representative spoke with at [place]: Tommi Rumps  Prior Living Arrangements/Services Living arrangements for the past 2 months: [place] with:: Spouse Patient language and need for interpreter reviewed:: Yes        Need for Family Participation in Patient Care: Yes (Comment) Care giver support system in place?: Yes (comment) Current home services: DME (oxygen via [employer] Walker rolling; Hospital bed; Lightweight manual wheelchair with seat cushion,) Criminal Activity/Legal Involvement Pertinent to Current Situation/Hospitalization: No - Comment as needed  Activities of Daily Living Home Assistive Devices/Equipment: CBG Meter, Oxygen, Nebulizer ADL Screening (condition at time of admission) Patient's cognitive ability adequate to safely complete daily activities?: Yes Is the patient deaf or have difficulty hearing?: No Does the patient have difficulty seeing, even when wearing glasses/contacts?: No Does the patient have difficulty concentrating, remembering, or making decisions?: No Patient able to express need for assistance with ADLs?: Yes Does the patient have difficulty dressing or bathing?: No Independently performs ADLs?: Yes (appropriate for developmental age) Does the patient have difficulty walking or climbing stairs?: Yes Weakness of Legs: Both Weakness of Arms/Hands: Both  Permission Sought/Granted Permission sought to share information with : Case Manager, Customer service manager, Family Supports       Permission granted to share info w AGENCY: Alvis Lemmings        Emotional Assessment Appearance:: Appears stated age Attitude/Demeanor/Rapport: Engaged Affect (typically observed): Appropriate Orientation: : Oriented to Situation, Oriented to  Time, Oriented to Place, Oriented to Self Alcohol /  Substance Use: Not Applicable Psych Involvement: No (comment)  Admission diagnosis:  Ischemic cardiomyopathy  [I25.5] Acute on chronic systolic congestive heart failure (HCC) [I50.23] Elevated troponin I level [R79.89] Acute on chronic combined systolic and diastolic CHF (congestive heart failure) (HCC) [I50.43] Acute anemia [D64.9] Patient Active Problem List   Diagnosis Date Noted   Acute on chronic combined systolic and diastolic CHF (congestive heart failure) ([place]) 07/18/2022   Chronic respiratory failure with hypoxia ([place]) 07/18/2022   Anemia 07/18/2022   Primary hypercoagulable state ([place]) 06/29/2022   Long term (current) use of anticoagulants 06/29/2022   NSTEMI (non-ST elevated myocardial infarction) ([place])    Malnutrition of moderate degree 06/23/2022   Acute respiratory failure with hypoxia ([place]) 06/18/2022   Aphasia    Dysarthria    Ischemic cardiomyopathy    Coronary artery disease of native artery of native heart with stable angina pectoris (HCC)    LV (left ventricular) mural thrombus    Acute kidney injury (AKI) with acute tubular necrosis (ATN) ([place]) 06/12/2022   Type 2 diabetes mellitus with hyperlipidemia ([place]) 06/12/2022   Acute on chronic systolic CHF (congestive heart failure) ([place]) 06/12/2022   MDD (major depressive disorder), recurrent episode, severe ([place]) 04/09/2014   Coronary atherosclerosis of native coronary artery 07/15/2013   Tobacco use disorder 07/15/2013   GERD (gastroesophageal reflux disease)    Depression    Basal cell carcinoma    DDD (degenerative disc disease)    Colon polyp    Restless leg syndrome    Osteopenia    PAD (peripheral artery disease) (HCC)    Neuromuscular disorder (HCC)    Sleep apnea    AAA (abdominal aortic aneurysm) ([place])    Abdominal wall mass of left lower quadrant 01/24/2012   PCP:  Gerome Sam, MD Pharmacy:   [facility] [place], [place] [address] Phone: [phone] Fax: [phone]  [id number] [place]  [address] [address]: [phone]: [address] [address]Phone: [phone]: [phone] [facility] [address]Phone: [phone]: [phone] [facility] [address] [address]: [phone]: [phone] [address]: [phone]: [phone]  Readmission Risk Interventions    07/19/2022   12:30 PM 06/24/2022    3:23 PM  Readmission Risk Prevention Plan  Transportation Screening Complete Complete  PCP or Specialist Appt within 3-5 Days  Complete  HRI or [place] Complete  Social Work Consult for RElsinorePlanning/Counseling  Complete  Palliative Care Screening  Not Applicable  Medication Review (Press photographer Complete Complete  HRI or HHebronComplete   SW Recovery Care/Counseling Consult Complete   Palliative Care Screening Not ALyleNot Applicable

## 2022-07-19 NOTE — Progress Notes (Signed)
Heart Failure Navigator Progress Note  Assessed for Heart & Vascular TOC clinic readiness.  Patient does not meet criteria due to hemodialysis.     Earnestine Leys, BSN, Clinical cytogeneticist Only

## 2022-07-20 DIAGNOSIS — R7989 Other specified abnormal findings of blood chemistry: Secondary | ICD-10-CM

## 2022-07-20 DIAGNOSIS — K219 Gastro-esophageal reflux disease without esophagitis: Secondary | ICD-10-CM | POA: Diagnosis not present

## 2022-07-20 DIAGNOSIS — N170 Acute kidney failure with tubular necrosis: Secondary | ICD-10-CM | POA: Diagnosis not present

## 2022-07-20 DIAGNOSIS — I5023 Acute on chronic systolic (congestive) heart failure: Secondary | ICD-10-CM | POA: Diagnosis not present

## 2022-07-20 DIAGNOSIS — I255 Ischemic cardiomyopathy: Secondary | ICD-10-CM | POA: Diagnosis not present

## 2022-07-20 DIAGNOSIS — I25118 Atherosclerotic heart disease of native coronary artery with other forms of angina pectoris: Secondary | ICD-10-CM | POA: Diagnosis not present

## 2022-07-20 LAB — RENAL FUNCTION PANEL
Albumin: 2.6 g/dL — ABNORMAL LOW (ref 3.5–5.0)
Anion gap: 9 (ref 5–15)
BUN: 20 mg/dL (ref 8–23)
CO2: 29 mmol/L (ref 22–32)
Calcium: 8.5 mg/dL — ABNORMAL LOW (ref 8.9–10.3)
Chloride: 97 mmol/L — ABNORMAL LOW (ref 98–111)
Creatinine, Ser: 3.83 mg/dL — ABNORMAL HIGH (ref 0.61–1.24)
GFR, Estimated: 16 mL/min — ABNORMAL LOW (ref >60)
Glucose, Bld: 104 mg/dL — ABNORMAL HIGH (ref 70–99)
Phosphorus: 3.4 mg/dL (ref 2.5–4.6)
Potassium: 4.3 mmol/L (ref 3.5–5.1)
Sodium: 135 mmol/L (ref 135–145)

## 2022-07-20 LAB — CBC
HCT: 24.3 % — ABNORMAL LOW (ref 39.0–52.0)
Hemoglobin: 8 g/dL — ABNORMAL LOW (ref 13.0–17.0)
MCH: 31.7 pg (ref 26.0–34.0)
MCHC: 32.9 g/dL (ref 30.0–36.0)
MCV: 96.4 fL (ref 80.0–100.0)
Platelets: 207 10*3/uL (ref 150–400)
RBC: 2.52 MIL/uL — ABNORMAL LOW (ref 4.22–5.81)
RDW: 16.5 % — ABNORMAL HIGH (ref 11.5–15.5)
WBC: 8.5 10*3/uL (ref 4.0–10.5)
nRBC: 0 % (ref 0.0–0.2)

## 2022-07-20 LAB — HEPATITIS B SURFACE ANTIBODY, QUANTITATIVE: Hep B S AB Quant (Post): <3.1 m[IU]/mL — ABNORMAL LOW (ref >9.9)

## 2022-07-20 LAB — PARATHYROID HORMONE, INTACT (NO CA): PTH: 81 pg/mL — ABNORMAL HIGH (ref 15–65)

## 2022-07-20 LAB — PROTIME-INR
INR: 2.2 — ABNORMAL HIGH (ref 0.8–1.2)
Prothrombin Time: 24.4 seconds — ABNORMAL HIGH (ref 11.4–15.2)

## 2022-07-20 LAB — GLUCOSE, CAPILLARY
Glucose-Capillary: 143 mg/dL — ABNORMAL HIGH (ref 70–99)
Glucose-Capillary: 97 mg/dL (ref 70–99)
Glucose-Capillary: 121 mg/dL — ABNORMAL HIGH (ref 70–99)
Glucose-Capillary: 133 mg/dL — ABNORMAL HIGH (ref 70–99)
Glucose-Capillary: 130 mg/dL — ABNORMAL HIGH (ref 70–99)

## 2022-07-20 MED ORDER — CHLORHEXIDINE GLUCONATE CLOTH 2 % EX PADS
6.0000 | MEDICATED_PAD | Freq: Every day | CUTANEOUS | Status: DC
  Administered 2022-07-20 – 2022-07-21 (×2): 6 via TOPICAL

## 2022-07-20 MED ORDER — HYDRALAZINE HCL 25 MG PO TABS
25.0000 mg | ORAL_TABLET | Freq: Two times a day (BID) | ORAL | Status: DC
  Administered 2022-07-20 – 2022-08-02 (×17): 25 mg via ORAL
  Filled 2022-07-20 (×20): qty 1

## 2022-07-20 MED ORDER — CITALOPRAM HYDROBROMIDE 20 MG PO TABS
20.0000 mg | ORAL_TABLET | Freq: Every day | ORAL | Status: DC
  Administered 2022-07-20 – 2022-08-01 (×10): 20 mg via ORAL
  Filled 2022-07-20 (×12): qty 1

## 2022-07-20 NOTE — Progress Notes (Signed)
[facility] Progress Note   Subjective:   Patient seen and examined at bedside.  Reports breathing better today.  Tolerated dialysis yesterday but signed off 8 min early d/t abdominal pain and needing to have a BM.  Reports constipation with abdominal pain throughout the night which improved following a 2nd BM this AM.  Denies CP and n/v/d.    Objective Vitals:   07/19/22 2035 07/19/22 2155 07/20/22 0030 07/20/22 0746  BP: (!) 141/82 (!) 143/90 112/60 125/76  Pulse: 97  91 97  Resp: '19  18 19  '$ Temp: 97.6 F (36.4 C)  97.6 F (36.4 C) 97.8 F (36.6 C)  TempSrc: Oral  Oral Oral  SpO2:   99% 91%  Weight:      Height:       Physical Exam General: chronically ill appearing male in NAD Heart:RRR, no mrg Lungs:+crackles on R, otherwise CTAB, nml WOB on RA Abdomen:soft, NTND Extremities:no LE edema Dialysis Access: [facility]   Filed Weights   07/19/22 0224 07/19/22 1542 07/19/22 1852  Weight: 68.6 kg 68.6 kg 66.6 kg    Intake/Output Summary (Last 24 hours) at 07/20/2022 0933 Last data filed at 07/19/2022 1852 Gross per 24 hour  Intake --  Output 2000 ml  Net -2000 ml    Additional Objective Labs: Basic Metabolic Panel: Recent Labs  Lab 07/18/22 1653 07/18/22 2046 07/19/22 0454  NA 137 135  --   K 4.1 4.1  --   CL 97*  --   --   CO2 28  --   --   GLUCOSE 130*  --   --   BUN 51*  --   --   CREATININE 6.05*  --   --   CALCIUM 8.6*  --   --   PHOS  --   --  3.2   Liver Function Tests: Recent Labs  Lab 07/18/22 1653 07/18/22 2045  AST 18 19  ALT 11 11  ALKPHOS 68 59  BILITOT 0.5 0.7  PROT 6.2* 6.3*  ALBUMIN 2.7* 2.7*   No results for input(s): "LIPASE", "AMYLASE" in the last 168 hours. CBC: Recent Labs  Lab 07/18/22 1653 07/18/22 2046 07/19/22 0454 07/20/22 0208  WBC 7.8  --  8.3 8.5  NEUTROABS 5.1  --   --   --   HGB 6.6* 7.1* 8.0* 8.0*  HCT 21.2* 21.0* 24.4* 24.3*  MCV 99.1  --  96.1 96.4  PLT 246  --  204 207    Cardiac  Enzymes: Recent Labs  Lab 07/18/22 2045  CKTOTAL 106   CBG: Recent Labs  Lab 07/19/22 0236 07/19/22 0744 07/19/22 1225 07/19/22 2050 07/20/22 0745  GLUCAP 114* 100* 105* 143* 97   Iron Studies:  Recent Labs    07/18/22 2045  IRON 45  TIBC 238*  FERRITIN 185   Lab Results  Component Value Date   INR 2.2 (H) 07/20/2022   INR 2.1 (H) 07/19/2022   INR 2.3 (H) 07/18/2022   Studies/Results: DG Chest 2 View  Result Date: 07/18/2022 CLINICAL DATA:  Shortness of breath. Bilateral leg swelling for several days. EXAM: CHEST - 2 VIEW COMPARISON:  07/08/2022 FINDINGS: Mildly enlarged cardiac silhouette with an interval increase in size. Interval increased prominence of the interstitial markings and increased patchy densities in both lungs. Small bilateral pleural effusions. Stable right jugular double-lumen catheter with its tip in the upper right atrium. Unremarkable bones. IMPRESSION: 1. Worsening changes of congestive heart failure. 2. Small bilateral pleural effusions.  3. Mild cardiomegaly. Electronically Signed   By: Claudie Revering M.D.   On: 07/18/2022 17:45    Medications:  sodium chloride     ferric gluconate (FERRLECIT) IVPB 250 mg (07/19/22 1451)    sodium chloride   Intravenous Once   aspirin EC  81 mg Oral Daily   atorvastatin  40 mg Oral QHS   carvedilol  6.25 mg Oral BID WC   Chlorhexidine Gluconate Cloth  6 each Topical Q0600   clonazePAM  0.5 mg Oral QHS   feeding supplement (NEPRO CARB STEADY)  237 mL Oral TID BM   guaiFENesin  600 mg Oral BID   hydrALAZINE  50 mg Oral BID   insulin aspart  0-6 Units Subcutaneous TID AC & HS   isosorbide dinitrate  20 mg Oral BID   multivitamin  1 tablet Oral QHS   QUEtiapine  300 mg Oral QHS   sodium chloride flush  3 mL Intravenous Q12H    Dialysis Orders: [facility] 4hr TTS EDW presumably 70kg 3/2.25, NO UFP Heparin 2K Units Mircera 50 q2weeks (last given 10/14)   Background: 71 y.o. 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 Patient placed at [facility] TTS with last treatment on Saturday after receiving a cardiac MRI on Friday. He is presenting with worsening shortness of breath, orthopnea, poor exercise tolerance; he denies fever, chills, cough, sore throat. He also denies NSAID use, excessive fluid intake (actually not eating or drinking very much); he has decreased urine output and does not feel full after voiding. He also denies tarry stool, blood in the stool or vomiting blood. In the ED his BUN/Cr was 51/6.05 with a potassium of 4.1 and HCO3 of 28, Hb 6.6 with a BNP of 2400.  CXR showed Interval increased prominence of the interstitial markings and increased patchy densities in both lungs. Small bilateral pleural effusions. BP was 122-150/77-90 with a HR in the 80-90's range     Assessment/Plan: Acute hypoxic resp failure - likely related to volume overload. CXR w/CHF and b/l pleural effusions.  HD overnight w/net UF 4.2L over last 24 hrs.  Continue to titrate down volume as tolerated.  AKI on CKD 3b - baseline creatinine 1.6 prior to recent heart cath.   2 days post cath (creat 2.2) and from 9/5 in ED here (creat 3.3) are not entirely consistent w/ a typical post-contrast ATN / AKI pattern however cholesterol embolic injury is possible. CES not likely to improve but CIN/ATN may.  Prior renal US c/w CKD, simple cysts. TDC placed on and 1st HD on 9/18. - HD tomorrow per regular schedule.  - Minimal to no UOP, no signs of renal recovery so far. HD dependent for 6 weeks.  -Hx severe leg pain with dialysis. Order  for Dilaudid 0.5 before HD and UF profile #2. At outpt HD he actually takes Oxycodone prescribed by cardiology for the leg pain per family.  HTN - home regimen hydralazine, coreg and imdur; BP in goal w/o antihypertensives. d/c home lasix d/t minimal to no UOP. CAD - sp LHC on 8/22 at [facility] as above  and seen by CTS. MRI last Friday and may need CABG with total occlusion in the LAD.  He may not recover renal function and may need to proceed with CABG with the designation of ESRD.  CTS consulted - no plans for surgery at this time, high risk.  His case to be discussed at multidisciplinary conference.  A/C systolic CHF - echo with EF 30-35%.  Meds limited by kidney function.  Volume removal with HD.  Anemia-  Hgb 8.0 s/p 1 unit pRBC. Tsat 19%, start iron load.  ESA recently dosed.  Bones-  CCa and phos in goal.  Not on binders or VDRA.  Nutrition - Currently on heart healthy/carb modified diet.  Add fluid restrictions. Alb 2.7 - start protein supplements.   Jen Mow, PA-C [facility] 07/20/2022,9:33 AM  LOS: 2 days

## 2022-07-20 NOTE — Assessment & Plan Note (Signed)
Cardiac catheterization with 2 vessel disease, LAD 80%, Lcx.   Continue medical therapy with aspirin and B blockade. Statin therapy.  Cardiac MRI with mid anterior/ anteroseptal, apical, septal, inferior, lateral walls and apex with LGE consistent with prior infarct. LGE greater than 50%, suggesting nonviability in apical anterior, septal, inferior and lateral walls and apex.  Nonviability in mid to distal LAD territory   Patient likely not candidate for surgery revascularization CABG. Follow up with cardiology recommendations.  Continue medical therapy with aspirin and statin.

## 2022-07-20 NOTE — Progress Notes (Signed)
Progress Note   Patient: Allen Cortez KKX:[id number] DOB: 1950-12-18 DOA: 07/18/2022     2 DOS: the patient was seen and examined on 07/20/2022   Brief hospital course: [name] was admitted to the hospital with volume overload in the setting of ESRD.   71 yo male with the past medical history of CAD, systolic heart failure, LV thrombus, right renal cancer sp partial left nephrectomy, T2DM, ESRD and chronic hypoxemic respiratory failure who presented with dyspnea. At home he had progressive dyspnea and orthopnea despite renal replacement therapy with ultrafiltration on T-TH-Sat schedule. On his initial physical examination his blood pressure was 148/94, HT 94, lungs with no wheezing, heart with S1 and S2 present and rhythmic, abdomen with no distention, no lower extremity edema.   Na 137, K 4,1 CL 97 bicarbonate 28 glucose 130 bun 51 cr 6,0  BNP 2,398 High sensitive troponin 266, 271  Wbc 7,8 hgb 6,6 plt [place] 19 negative   Chest radiograph with cardiomegaly, bilateral interstitial infiltrates, symmetric, bilateral small pleural effusions, right IJ vein HD catheter in place.   EKG 85 bpm, left axis deviation, normal intervals, sinus rhythm with poor R to R wave progression, with no significant ST segment or T wave changes, positive LVH.   10/17 Patient had urgent HD with ultrafiltration      Assessment and Plan: * Acute kidney injury (AKI) with acute tubular necrosis (ATN) ([place]) ESRD on HD.  Patient with improving volume status Today is negative 4,100 ml   Plan to continue ultrafiltration and hemodialysis per nephrology recommendations.  Acute on chronic hypoxemic respiratory failure due to acute pulmonary edema Oxygenation has improved 02 saturation today 100% on 3 L/min per [place].   Anemia of chronic renal disease with iron deficiency  Continue with EPO and iron.   Acute on chronic systolic CHF (congestive heart failure) (HCC)  Echocardiogram with reduced LV  systolic function EF 30 to 35%, akinesis of the LV apical segment wall, inferior wall, anterior wall and lateral wall. Akinesis of the entire apical segment. Severe hypokinesis of the left ventricle, basal mid anterior wall. Preserved RV systolic function. RVSP 45.5 mmHg.  Systolic blood pressure  169 to 118 mmHg.   Continue close blood pressure monitoring.  Continue with carvedilol After load reduction with hydralazine and isosorbide.   Continue heparin for LV thrombus, transition to warfarin after completing cardiac workup.   Coronary atherosclerosis of native coronary artery Cardiac catheterization with 2 vessel disease, LAD 80%, Lcx.   Continue medical therapy with aspirin and B blockade. Statin therapy.  Cardiac MRI with mid anterior/ anteroseptal, apical, septal, inferior, lateral walls and apex with LGE consistent with prior infarct. LGE greater than 50%, suggesting nonviability in apical anterior, septal, inferior and lateral walls and apex.  Nonviability in mid to distal LAD territory   Patient likely not candidate for surgery revascularization CABG. Follow up with cardiology recommendations.   Type 2 diabetes mellitus with hyperlipidemia (HCC) Continue insulin sliding scale for glucose cover and monitoring Patient is tolerating po well.   GERD (gastroesophageal reflux disease) Continue with antiacid therapy.   Malnutrition of moderate degree Continue with nutritional supplements.   PAD (peripheral artery disease) ([place]) Continue with statin therapy.         Subjective: Patient with no chest pain or dyspnea, continue to be very weak and deconditioned   Physical Exam: Vitals:   07/20/22 0030 07/20/22 0746 07/20/22 1001 07/20/22 1116  BP: 112/60 125/76 (!) 145/88 118/73  Pulse: 91 97 96 95  Resp: '18 19  18  '$ Temp: 97.6 F (36.4 C) 97.8 F (36.6 C)  97.8 F (36.6 C)  TempSrc: Oral Oral  Oral  SpO2: 99% 91%  100%  Weight:      Height:       Neurology awake  and alert ENT with mild pallor Cardiovascular with S1 and S2 present and rhythmic with no rubs, murmurs or gallops Respiratory with mild rales at bases, no wheezing Abdomen with no distention  No lower extremity edema  Data Reviewed:    Family Communication: I spoke with patient's sisters at the bedside, we talked in detail about patient's condition, plan of care and prognosis and all questions were addressed.   Disposition: Status is: Inpatient Remains inpatient appropriate because: inpatient renal replacement therapy   Planned Discharge Destination: Home   Author: Tawni Millers, MD 07/20/2022 12:58 PM  For on call review [email or web address].

## 2022-07-20 NOTE — Assessment & Plan Note (Addendum)
ESRD on HD.  Patient with improving volume status Hyperkalemia, hyponatremia.   K today is 5,5 with serum bicarbonate at 23 and BUN 78 pre HD.   Plan to continue ultrafiltration and hemodialysis per nephrology recommendations.  Acute on chronic hypoxemic respiratory failure due to acute pulmonary edema Oxygenation has improved 02 saturation today 95% on 3 L/min per [place].   Anemia of chronic renal disease with iron deficiency  Continue with EPO and iron.

## 2022-07-20 NOTE — Progress Notes (Signed)
Pt receives out-pt HD at [facility] on TTS. Pt arrives at 5:35 am for 5:55 chair time. Will assist as needed.  Melven Sartorius Renal Navigator [phone]

## 2022-07-20 NOTE — Assessment & Plan Note (Addendum)
Echocardiogram with reduced LV systolic function EF 30 to 35%, akinesis of the LV apical segment wall, inferior wall, anterior wall and lateral wall. Akinesis of the entire apical segment. Severe hypokinesis of the left ventricle, basal mid anterior wall. Preserved RV systolic function. RVSP 45.5 mmHg.  Systolic blood pressure  113 to 130 mmHg.   Continue close blood pressure monitoring.  Continue with carvedilol After load reduction with hydralazine and isosorbide.   Continue heparin for LV thrombus, transition to warfarin after completing cardiac workup.

## 2022-07-20 NOTE — Assessment & Plan Note (Signed)
Continue with antiacid therapy.  ?

## 2022-07-20 NOTE — Assessment & Plan Note (Addendum)
Glucose has been stable, will hold on insulin therapy and will continue capillary glucose monitoring as needed bid.  Patient with poor oral intake.

## 2022-07-20 NOTE — Progress Notes (Signed)
     [address]       [place] [address]             [phone]       Allen Cortez is know to our service.  He represents with another NSTEMI, and heart failure exacerbation in the setting of acute on chronic renal failure.  He has undergone a cardiac MRI which shows minimal uptake along his anterior wall, an EF of 25%, an LV thrombus, and an apical aneurysm.  He would be extremely high risk for surgical revascularization at this point.  His case will be discussed in our multidisciplinary conference.  No plans for surgery at this point.

## 2022-07-20 NOTE — Progress Notes (Addendum)
Rounding Note    Patient Name: Allen Cortez Date of Encounter: 07/20/2022  [place] Cardiologist: Shelva Majestic, MD   Subjective   Had a rough night. Constipation. No chest pain, does feel his breathing is better.   Inpatient Medications    Scheduled Meds:  sodium chloride   Intravenous Once   aspirin EC  81 mg Oral Daily   atorvastatin  40 mg Oral QHS   carvedilol  6.25 mg Oral BID WC   Chlorhexidine Gluconate Cloth  6 each Topical Q0600   clonazePAM  0.5 mg Oral QHS   feeding supplement (NEPRO CARB STEADY)  237 mL Oral TID BM   guaiFENesin  600 mg Oral BID   hydrALAZINE  50 mg Oral BID   insulin aspart  0-6 Units Subcutaneous TID AC & HS   isosorbide dinitrate  20 mg Oral BID   multivitamin  1 tablet Oral QHS   QUEtiapine  300 mg Oral QHS   sodium chloride flush  3 mL Intravenous Q12H   Continuous Infusions:  sodium chloride     ferric gluconate (FERRLECIT) IVPB 250 mg (07/19/22 1451)   PRN Meds: sodium chloride, acetaminophen **OR** acetaminophen, albuterol, bisacodyl, sodium chloride flush   Vital Signs    Vitals:   07/19/22 2035 07/19/22 2155 07/20/22 0030 07/20/22 0746  BP: (!) 141/82 (!) 143/90 112/60 125/76  Pulse: 97  91 97  Resp: '19  18 19  '$ Temp: 97.6 F (36.4 C)  97.6 F (36.4 C) 97.8 F (36.6 C)  TempSrc: Oral  Oral Oral  SpO2:   99% 91%  Weight:      Height:        Intake/Output Summary (Last 24 hours) at 07/20/2022 0808 Last data filed at 07/19/2022 1852 Gross per 24 hour  Intake --  Output 2000 ml  Net -2000 ml      07/19/2022    6:52 PM 07/19/2022    3:42 PM 07/19/2022    2:24 AM  Last 3 Weights  Weight (lbs) 146 lb 13.2 oz 151 lb 3.8 oz 151 lb 3.8 oz  Weight (kg) 66.6 kg 68.6 kg 68.6 kg      Telemetry    Sinus Rhythm - Personally Reviewed  ECG    No new tracing  Physical Exam   GEN: Older WM, wearing [place] no distress Neck: No JVD Cardiac: RRR, no murmurs, rubs, or gallops.  Respiratory: Clear to  auscultation bilaterally. GI: Soft, nontender, non-distended  MS: No edema; No deformity. Neuro:  Nonfocal  Psych: Normal affect   Labs    High Sensitivity Troponin:   Recent Labs  Lab 07/18/22 1653 07/18/22 1845 07/18/22 2045 07/19/22 0454  TROPONINIHS 266* 271* 256* 288*     Chemistry Recent Labs  Lab 07/18/22 1653 07/18/22 2045 07/18/22 2046  NA 137  --  135  K 4.1  --  4.1  CL 97*  --   --   CO2 28  --   --   GLUCOSE 130*  --   --   BUN 51*  --   --   CREATININE 6.05*  --   --   CALCIUM 8.6*  --   --   PROT 6.2* 6.3*  --   ALBUMIN 2.7* 2.7*  --   AST 18 19  --   ALT 11 11  --   ALKPHOS 68 59  --   BILITOT 0.5 0.7  --   GFRNONAA 9*  --   --  ANIONGAP 12  --   --     Lipids No results for input(s): "CHOL", "TRIG", "HDL", "LABVLDL", "LDLCALC", "CHOLHDL" in the last 168 hours.  Hematology Recent Labs  Lab 07/18/22 1653 07/18/22 2046 07/19/22 0454 07/20/22 0208  WBC 7.8  --  8.3 8.5  RBC 2.14*  --  2.54*  1.40* 2.52*  HGB 6.6* 7.1* 8.0* 8.0*  HCT 21.2* 21.0* 24.4* 24.3*  MCV 99.1  --  96.1 96.4  MCH 30.8  --  31.5 31.7  MCHC 31.1  --  32.8 32.9  RDW 16.9*  --  16.1* 16.5*  PLT 246  --  204 207   Thyroid  Recent Labs  Lab 07/18/22 2040  TSH 1.676    BNP Recent Labs  Lab 07/18/22 1653  BNP 2,398.5*    DDimer No results for input(s): "DDIMER" in the last 168 hours.   Radiology    DG Chest 2 View  Result Date: 07/18/2022 CLINICAL DATA:  Shortness of breath. Bilateral leg swelling for several days. EXAM: CHEST - 2 VIEW COMPARISON:  07/08/2022 FINDINGS: Mildly enlarged cardiac silhouette with an interval increase in size. Interval increased prominence of the interstitial markings and increased patchy densities in both lungs. Small bilateral pleural effusions. Stable right jugular double-lumen catheter with its tip in the upper right atrium. Unremarkable bones. IMPRESSION: 1. Worsening changes of congestive heart failure. 2. Small bilateral  pleural effusions. 3. Mild cardiomegaly. Electronically Signed   By: Claudie Revering M.D.   On: 07/18/2022 17:45    Cardiac Studies   ECHO 06/12/2022:  1. 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 Left ventricular ejection fraction, by estimation, is 30-35%. The left ventricle is abnormal. The left ventricle has focal regional wall motion abnormalities. The left ventricular internal cavity size was mildly dilated. There is akinesis of  the left ventricular, apical septal wall, inferior wall, anterior wall and lateral wall. There is akinesis of the left ventricular, entire apical segment. There is severe hypokinesis of the left ventricular, basal-mid anterior wall. A falst tendon is present in the mid LV cavity.   2. Right ventricular systolic function is normal. The right ventricular size is normal. There is moderately elevated pulmonary artery systolic pressure. The estimated right ventricular systolic pressure is 40.9 mmHg.   3. The mitral valve is normal in structure. Mild mitral valve regurgitation. No evidence of mitral stenosis.   4. The aortic valve is tricuspid. There is moderate calcification of the aortic valve. Aortic valve regurgitation is not visualized. Aortic valve sclerosis/calcification is present, without any evidence of aortic stenosis.   5. The inferior vena cava is dilated in size with >50% respiratory variability, suggesting right atrial pressure of 8 mmHg.      CMR 07/15/2022: 1. Subendocardial LGE consistent with prior infarct in mid anterior/anteroseptal, apical anterior/septal/inferior/lateral walls, and apex. LGE is greater than 50% transmural suggesting nonviability in apical anterior/septal/inferior/lateral walls and apex. LGE <50% transmural in mid anterior wall but wall thickness <4.11m suggests nonviability. Findings are consistent with nonviability in mid to distal LAD  territory 2. Severe LV dilatation with severe systolic dysfunction (EF 273%. Thinning/akinesis of mid to apical anterior/anteroseptal walls and apical inferior/lateral walls. Aneurysm of apex. 3.  LV apical thrombus measures 159mx 12m41m.  Normal RV size and systolic function (EF 48%53%. Diffuse pulmonary opacities and bilateral pleural effusions (moderate on right, small on left),  consider CT chest for further evaluation.    Patient Profile     71 y.o. male with a PMHx of nonobstructive CAD 2011, AAA s/p repair 2013, HTN, HLD, T2DM, RCC s/p partial left nephrectomy 2015, OSA, GERD, RLS. who is being seen 07/18/2022 for the evaluation of acute on chronic HFrEF, NSTEMI and known CAD at the request of the primary hospitalist service.  Assessment & Plan    Acute hypoxic respiratory failure ESRD on HD -- presented with significant volume overload in the setting of HFrEF and ESRD. Had two HD sessions since admission.  -- nephrology following   HFrEF -ICM => WITH ACUTE ON CHRONIC COMBINED SYSTOLIC AND DIASTOLIC HEART FAILURE -- Echo 06/12/22 LVEF 30-35% with clear evidence of LV thrombus. Akinesis of the anterior wall. No significant valve disease -- volume management per HD  -- on coreg 6.'25mg'$  BID, hydralazine '50mg'$  TID, isordil '20mg'$  BID -- currently no ACE/ARB/ARNi with renal disease -- no room for SGLT2i with ERSD   CAD w/ Angina & Elevated troponin /Demand Ischemia -type II MI/supply versus demand ischemia from combined CHF, known CAD and ESRD) -- cath through the [place] with severe 2v disease with CTO of the LAD and 80% Lcx. Was awaiting TCTS input regarding options. Outpatient cardiac MRI 10/13 without viable myocardium. Have requested formal TCTS consult -- hsTn 266>>271 in the setting of known CAD with volume overload -- continue ASA, statin, BB -> add Isordil to the hydralazine -- after discussion with MD, the cath films that were scanned in for him do not correlate with the Cath Lab  findings on report--report indicates occluded LAD and severe disease in the LCx.  Based on result of cardiac CMR, unclear if revascularization of the LAD territory with reasonable, especially if it involves CABG. He is pending TCTS consult. Will request [facility] films again (we have been unable to locate original disc) Films were requested today and were received by CD.  Have not yet been loaded up to look at.  It appears that the images that I previously reviewed were from 2011 which makes sense. I did talk with Dr. Kipp Brood and he was not excited about the concept of CABG in this gentleman with minimal viability in the LAD distribution.  As I felt, he probably would not benefit from LAD revascularization anyway.  Plan is to discuss in heart team on Friday with the potential to consider simply PCI of the LCx lesion based on that discussion.  We will tentatively place him on the board for PCI on Friday pending this discussion. Since he has essentially been turned down for CABG, we can probably load with Thienopyridine.  LV thrombus -- diagnosed at the [facility] -- Cardiac MRI + thrombus -- coumadin held, INR 2.2 with plans for heparin once INR <2   HTN -- continue coreg 6.'25mg'$  BID, hydralazine '50mg'$  TID, Isordil 20 mg   HLD -- recent LDL 60 -- continue atrovastatin '40mg'$  daily   AAA s/p remote repair with recurrence -- family reported repeat size last month was 4.2cm and that no specific intervention was felt needed  Bilateral LE pain -- Significant bilateral leg pain which is worse with exertion but also at rest when not wearing nasal cannula oxygen.  Not sure if related to true claudication or simply just low oxygen levels from heart failure. Recent LE dopplers (07/11/2022) normal. Low threshold to consider ABIs   For questions or updates, please contact [place] Please consult [email or web address] for contact info under  Signed, Reino Bellis, NP  07/20/2022, 8:08 AM       ATTENDING ATTESTATION  I have seen, examined and evaluated the patient this morning on rounds along with Reino Bellis, NP-C.  After reviewing all the available data and chart, we discussed the patients laboratory, study & physical findings as well as symptoms in detail.  I agree with her findings, examination as well as impression recommendations as per our discussion.    Attending adjustments noted in italics.   Overall he seems to be feeling better.  Major concern now is his need for having a BM.  Breathing has improved.  He felt a little bit of tightness in the chest after eating, but was resting comfortably when I went to see him again later on.  Plan as noted above and italics.  Agree with note.    Leonie Man, MD, MS Glenetta Hew, M.D., M.S. Interventional Cardiologist  [place]  Pager # [phone] Phone # [phone] [address] [place], [place] [address]

## 2022-07-20 NOTE — Progress Notes (Signed)
ANTICOAGULATION CONSULT NOTE  Pharmacy Consult for heparin  Indication: LV thrombus  Allergies  Allergen Reactions   Dilaudid [Hydromorphone Hcl] Other (See Comments)    hallucinations   Mirapex [Pramipexole Dihydrochloride] Other (See Comments)    Leg pain   Prednisone Other (See Comments)    irritability   Gabapentin     Other reaction(s): Tremor    Patient Measurements: Height: '5\' 10"'$  (177.8 cm) Weight: 66.6 kg (146 lb 13.2 oz) IBW/kg (Calculated) : 73  Vital Signs: Temp: 97.6 F (36.4 C) (10/18 0030) Temp Source: Oral (10/18 0030) BP: 112/60 (10/18 0030) Pulse Rate: 91 (10/18 0030)  Labs: Recent Labs    07/18/22 1653 07/18/22 1845 07/18/22 1915 07/18/22 2045 07/18/22 2046 07/19/22 0454 07/20/22 0208  HGB 6.6*  --   --   --  7.1* 8.0* 8.0*  HCT 21.2*  --   --   --  21.0* 24.4* 24.3*  PLT 246  --   --   --   --  204 207  LABPROT  --   --  25.1*  --   --  23.7* 24.4*  INR  --   --  2.3*  --   --  2.1* 2.2*  CREATININE 6.05*  --   --   --   --   --   --   CKTOTAL  --   --   --  106  --   --   --   TROPONINIHS 266* 271*  --  256*  --  288*  --      Estimated Creatinine Clearance: 10.4 mL/min (A) (by C-G formula based on SCr of 6.05 mg/dL (H)).   Medical History: Past Medical History:  Diagnosis Date   AAA (abdominal aortic aneurysm) (HCC)    Abdominal wall mass of left lower quadrant    Basal cell carcinoma    CAD (coronary artery disease) 2011   moderate   Colon polyp    DDD (degenerative disc disease)    Depression    Diabetes mellitus type II    no meds   GERD (gastroesophageal reflux disease)    Hyperlipidemia    Neuromuscular disorder (HCC)    Osteopenia    PAD (peripheral artery disease) (HCC)    Renal cell carcinoma    Restless leg syndrome    Sleep apnea    used a cpap yr ago-does not snore or use it now.    Medications:  Medications Prior to Admission  Medication Sig Dispense Refill Last Dose   acetaminophen (TYLENOL) 325 MG  tablet Take 2 tablets (650 mg total) by mouth every 4 (four) hours as needed for headache or mild pain.   Past Month   aspirin EC 81 MG tablet Take 1 tablet (81 mg total) by mouth daily. Swallow whole. 30 tablet 3 07/18/2022   atorvastatin (LIPITOR) 40 MG tablet Take 40 mg by mouth at bedtime.   07/17/2022   B Complex-C-Zn-Folic Acid (DIALYVITE 818-EXHB 15) 0.8 MG TABS Take 1 tablet by mouth 3 (three) times daily as needed (with meals).   Past Week   carvedilol (COREG) 6.25 MG tablet Take 1 tablet (6.25 mg total) by mouth 2 (two) times daily with a meal. 180 tablet 3 07/18/2022 at 0900   citalopram (CELEXA) 40 MG tablet Take 40 mg by mouth every evening.   07/17/2022   clonazePAM (KLONOPIN) 0.5 MG tablet Take 0.5 mg by mouth at bedtime.   07/17/2022   furosemide (LASIX) 40 MG tablet Take  1 tablet (40 mg total) by mouth 2 (two) times daily. 180 tablet 3 07/18/2022   hydrALAZINE (APRESOLINE) 25 MG tablet Take 1 tablet (25 mg total) by mouth every 8 (eight) hours. 270 tablet 3 07/18/2022   isosorbide mononitrate (IMDUR) 30 MG 24 hr tablet Take 0.5 tablets (15 mg total) by mouth daily. 45 tablet 3 07/18/2022   lansoprazole (PREVACID) 15 MG capsule Take 15 mg by mouth every morning.   07/18/2022   multivitamin (RENA-VIT) TABS tablet Take 1 tablet by mouth at bedtime. 30 tablet 0 07/17/2022   Nutritional Supplements (NEPRO) LIQD Take 237 mLs by mouth 2 (two) times daily.   07/18/2022   QUEtiapine (SEROQUEL) 300 MG tablet Take 300 mg by mouth at bedtime.   07/17/2022   warfarin (COUMADIN) 5 MG tablet Take 1 tablet daily Monday, Wednesday, Friday, Saturday and Sunday and take half tablet daily on Tuesday and Thursday. (Patient taking differently: Take 2.5-5 mg by mouth See admin instructions. Take 2.5 mg (1/2 tablet) by mouth daily every day of the week except for Monday's. On Monday's take 5 mg (1 tablet).) 30 tablet 0 07/17/2022 at 2000   feeding supplement (ENSURE ENLIVE / ENSURE PLUS) LIQD Take 237 mLs by  mouth 2 (two) times daily between meals. (Patient not taking: Reported on 07/18/2022) 14220 mL 0 Not Taking    Assessment: 71 yo male on warfarin PTA for recent LV thrombus. He is also noted with ESRD and s/p emergent HD.  Plans are for possible CABG and pharmacy to start heparin when INR is  < 2.0 -INR= 2.2, hg= 8  Goal of Therapy:  Heparin level 0.3-0.7 units/ml Monitor platelets by anticoagulation protocol: Yes   Plan:  -Will start heparin when INR < 2.0 -Daily PT/INR  Hildred Laser, PharmD Clinical Pharmacist **Pharmacist phone directory can now be found on [email or web address] ([email or web address]).  Listed under [place].

## 2022-07-20 NOTE — Hospital Course (Addendum)
[name] was admitted to the hospital with volume overload in the setting of ESRD.   71 yo male with the past medical history of CAD, systolic heart failure, LV thrombus, right renal cancer sp partial left nephrectomy, T2DM, ESRD and chronic hypoxemic respiratory failure who presented with dyspnea. At home he had progressive dyspnea and orthopnea despite renal replacement therapy with ultrafiltration on T-TH-Sat schedule. On his initial physical examination his blood pressure was 148/94, HT 94, lungs with no wheezing, heart with S1 and S2 present and rhythmic, abdomen with no distention, no lower extremity edema.   Na 137, K 4,1 CL 97 bicarbonate 28 glucose 130 bun 51 cr 6,0  BNP 2,398 High sensitive troponin 266, 271  Wbc 7,8 hgb 6,6 plt [place] 19 negative   Chest radiograph with cardiomegaly, bilateral interstitial infiltrates, symmetric, bilateral small pleural effusions, right IJ vein HD catheter in place.   EKG 85 bpm, left axis deviation, normal intervals, sinus rhythm with poor R to R wave progression, with no significant ST segment or T wave changes, positive LVH.   10/17 Patient had urgent HD with ultrafiltration  10/19 HD. Plan for multidisciplinary cardiovascular meeting tomorrow to decide if patient will benefit more from PCI than CABG.  10/20 PCI

## 2022-07-20 NOTE — Assessment & Plan Note (Signed)
Continue with statin therapy.  ?

## 2022-07-21 ENCOUNTER — Telehealth: Payer: Self-pay | Admitting: Cardiology

## 2022-07-21 DIAGNOSIS — N170 Acute kidney failure with tubular necrosis: Secondary | ICD-10-CM | POA: Diagnosis not present

## 2022-07-21 DIAGNOSIS — I25118 Atherosclerotic heart disease of native coronary artery with other forms of angina pectoris: Secondary | ICD-10-CM | POA: Diagnosis not present

## 2022-07-21 DIAGNOSIS — E440 Moderate protein-calorie malnutrition: Secondary | ICD-10-CM | POA: Diagnosis not present

## 2022-07-21 DIAGNOSIS — K219 Gastro-esophageal reflux disease without esophagitis: Secondary | ICD-10-CM | POA: Diagnosis not present

## 2022-07-21 DIAGNOSIS — I5023 Acute on chronic systolic (congestive) heart failure: Secondary | ICD-10-CM | POA: Diagnosis not present

## 2022-07-21 LAB — PROTIME-INR
INR: 1.7 — ABNORMAL HIGH (ref 0.8–1.2)
Prothrombin Time: 19.7 seconds — ABNORMAL HIGH (ref 11.4–15.2)

## 2022-07-21 LAB — CBC
HCT: 23.2 % — ABNORMAL LOW (ref 39.0–52.0)
Hemoglobin: 7.4 g/dL — ABNORMAL LOW (ref 13.0–17.0)
MCH: 32.2 pg (ref 26.0–34.0)
MCHC: 31.9 g/dL (ref 30.0–36.0)
MCV: 100.9 fL — ABNORMAL HIGH (ref 80.0–100.0)
Platelets: 174 10*3/uL (ref 150–400)
RBC: 2.3 MIL/uL — ABNORMAL LOW (ref 4.22–5.81)
RDW: 17.6 % — ABNORMAL HIGH (ref 11.5–15.5)
WBC: 6.5 10*3/uL (ref 4.0–10.5)
nRBC: 0 % (ref 0.0–0.2)

## 2022-07-21 LAB — BASIC METABOLIC PANEL
Anion gap: 12 (ref 5–15)
BUN: 33 mg/dL — ABNORMAL HIGH (ref 8–23)
CO2: 28 mmol/L (ref 22–32)
Calcium: 8.4 mg/dL — ABNORMAL LOW (ref 8.9–10.3)
Chloride: 95 mmol/L — ABNORMAL LOW (ref 98–111)
Creatinine, Ser: 4.94 mg/dL — ABNORMAL HIGH (ref 0.61–1.24)
GFR, Estimated: 12 mL/min — ABNORMAL LOW (ref >60)
Glucose, Bld: 114 mg/dL — ABNORMAL HIGH (ref 70–99)
Potassium: 3.9 mmol/L (ref 3.5–5.1)
Sodium: 135 mmol/L (ref 135–145)

## 2022-07-21 LAB — HEPARIN LEVEL (UNFRACTIONATED): Heparin Unfractionated: <0.1 IU/mL — ABNORMAL LOW (ref 0.30–0.70)

## 2022-07-21 LAB — GLUCOSE, CAPILLARY
Glucose-Capillary: 107 mg/dL — ABNORMAL HIGH (ref 70–99)
Glucose-Capillary: 134 mg/dL — ABNORMAL HIGH (ref 70–99)

## 2022-07-21 MED ORDER — ANTICOAGULANT SODIUM CITRATE 4% (200MG/5ML) IV SOLN: 5.0000 mL | Status: DC | PRN

## 2022-07-21 MED ORDER — HEPARIN SODIUM (PORCINE) 1000 UNIT/ML DIALYSIS
2000.0000 [IU] | INTRAMUSCULAR | Status: DC | PRN
  Administered 2022-07-21: 2000 [IU] via INTRAVENOUS_CENTRAL
  Filled 2022-07-21 (×2): qty 2

## 2022-07-21 MED ORDER — DARBEPOETIN ALFA 150 MCG/0.3ML IJ SOSY
150.0000 ug | PREFILLED_SYRINGE | INTRAMUSCULAR | Status: DC
  Administered 2022-07-23: 150 ug via INTRAVENOUS
  Filled 2022-07-21 (×2): qty 0.3

## 2022-07-21 MED ORDER — ALTEPLASE 2 MG IJ SOLR: 2.0000 mg | Freq: Once | INTRAMUSCULAR | Status: DC | PRN

## 2022-07-21 MED ORDER — HEPARIN SODIUM (PORCINE) 1000 UNIT/ML IJ SOLN
INTRAMUSCULAR | Status: AC
  Filled 2022-07-21: qty 2

## 2022-07-21 MED ORDER — HEPARIN (PORCINE) 25000 UT/250ML-% IV SOLN
1250.0000 [IU]/h | INTRAVENOUS | Status: DC
  Administered 2022-07-21: 850 [IU]/h via INTRAVENOUS
  Filled 2022-07-21: qty 250

## 2022-07-21 MED ORDER — HEPARIN SODIUM (PORCINE) 1000 UNIT/ML DIALYSIS
1000.0000 [IU] | INTRAMUSCULAR | Status: DC | PRN
  Administered 2022-07-21: 1000 [IU]
  Filled 2022-07-21: qty 1

## 2022-07-21 NOTE — Progress Notes (Signed)
ANTICOAGULATION CONSULT NOTE  Pharmacy Consult for heparin  Indication: LV thrombus  Allergies  Allergen Reactions   Dilaudid [Hydromorphone Hcl] Other (See Comments)    hallucinations   Mirapex [Pramipexole Dihydrochloride] Other (See Comments)    Leg pain   Prednisone Other (See Comments)    irritability   Gabapentin     Other reaction(s): Tremor    Patient Measurements: Height: '5\' 10"'$  (177.8 cm) Weight: 64.6 kg (142 lb 6.7 oz) IBW/kg (Calculated) : 73  Vital Signs: Temp: 97.6 F (36.4 C) (10/19 1215) Temp Source: Oral (10/19 0745) BP: 135/90 (10/19 1215) Pulse Rate: 93 (10/19 1215)  Labs: Recent Labs    07/18/22 1653 07/18/22 1845 07/18/22 1915 07/18/22 2045 07/18/22 2046 07/19/22 0454 07/20/22 0208 07/20/22 0948 07/21/22 0144 07/21/22 0737  HGB 6.6*  --   --   --  7.1* 8.0* 8.0*  --   --   --   HCT 21.2*  --   --   --  21.0* 24.4* 24.3*  --   --   --   PLT 246  --   --   --   --  204 207  --   --   --   LABPROT  --   --    < >  --   --  23.7* 24.4*  --   --  19.7*  INR  --   --    < >  --   --  2.1* 2.2*  --   --  1.7*  CREATININE 6.05*  --   --   --   --   --   --  3.83* 4.94*  --   CKTOTAL  --   --   --  106  --   --   --   --   --   --   TROPONINIHS 266* 271*  --  256*  --  288*  --   --   --   --    < > = values in this interval not displayed.     Estimated Creatinine Clearance: 12.4 mL/min (A) (by C-G formula based on SCr of 4.94 mg/dL (H)).   Medical History: Past Medical History:  Diagnosis Date   AAA (abdominal aortic aneurysm) (HCC)    Abdominal wall mass of left lower quadrant    Basal cell carcinoma    CAD (coronary artery disease) 2011   moderate   Colon polyp    DDD (degenerative disc disease)    Depression    Diabetes mellitus type II    no meds   GERD (gastroesophageal reflux disease)    Hyperlipidemia    Neuromuscular disorder (HCC)    Osteopenia    PAD (peripheral artery disease) (HCC)    Renal cell carcinoma    Restless  leg syndrome    Sleep apnea    used a cpap yr ago-does not snore or use it now.    Medications:  Medications Prior to Admission  Medication Sig Dispense Refill Last Dose   acetaminophen (TYLENOL) 325 MG tablet Take 2 tablets (650 mg total) by mouth every 4 (four) hours as needed for headache or mild pain.   Past Month   aspirin EC 81 MG tablet Take 1 tablet (81 mg total) by mouth daily. Swallow whole. 30 tablet 3 07/18/2022   atorvastatin (LIPITOR) 40 MG tablet Take 40 mg by mouth at bedtime.   07/17/2022   B Complex-C-Zn-Folic Acid (DIALYVITE 242-ASTM 15) 0.8 MG TABS  Take 1 tablet by mouth 3 (three) times daily as needed (with meals).   Past Week   carvedilol (COREG) 6.25 MG tablet Take 1 tablet (6.25 mg total) by mouth 2 (two) times daily with a meal. 180 tablet 3 07/18/2022 at 0900   citalopram (CELEXA) 40 MG tablet Take 40 mg by mouth every evening.   07/17/2022   clonazePAM (KLONOPIN) 0.5 MG tablet Take 0.5 mg by mouth at bedtime.   07/17/2022   furosemide (LASIX) 40 MG tablet Take 1 tablet (40 mg total) by mouth 2 (two) times daily. 180 tablet 3 07/18/2022   hydrALAZINE (APRESOLINE) 25 MG tablet Take 1 tablet (25 mg total) by mouth every 8 (eight) hours. 270 tablet 3 07/18/2022   isosorbide mononitrate (IMDUR) 30 MG 24 hr tablet Take 0.5 tablets (15 mg total) by mouth daily. 45 tablet 3 07/18/2022   lansoprazole (PREVACID) 15 MG capsule Take 15 mg by mouth every morning.   07/18/2022   multivitamin (RENA-VIT) TABS tablet Take 1 tablet by mouth at bedtime. 30 tablet 0 07/17/2022   Nutritional Supplements (NEPRO) LIQD Take 237 mLs by mouth 2 (two) times daily.   07/18/2022   QUEtiapine (SEROQUEL) 300 MG tablet Take 300 mg by mouth at bedtime.   07/17/2022   warfarin (COUMADIN) 5 MG tablet Take 1 tablet daily Monday, Wednesday, Friday, Saturday and Sunday and take half tablet daily on Tuesday and Thursday. (Patient taking differently: Take 2.5-5 mg by mouth See admin instructions. Take 2.5  mg (1/2 tablet) by mouth daily every day of the week except for Monday's. On Monday's take 5 mg (1 tablet).) 30 tablet 0 07/17/2022 at 2000   feeding supplement (ENSURE ENLIVE / ENSURE PLUS) LIQD Take 237 mLs by mouth 2 (two) times daily between meals. (Patient not taking: Reported on 07/18/2022) 14220 mL 0 Not Taking    Assessment: 71 yo male on warfarin PTA for recent LV thrombus. He is also noted with ESRD and s/p emergent HD.  Plans are for possible CABG and pharmacy to start heparin when INR is  < 2.0 -INR= 1.7, hg= 8  Goal of Therapy:  Heparin level 0.3-0.7 units/ml Monitor platelets by anticoagulation protocol: Yes   Plan:  -Start heparin at 850 units/hr -Heparin level in 8 hours and daily wth CBC daily   Hildred Laser, PharmD Clinical Pharmacist **Pharmacist phone directory can now be found on [email or web address] ([email or web address]).  Listed under [place].

## 2022-07-21 NOTE — Telephone Encounter (Signed)
Routed to Dr. Ellyn Hack Patient is in hospital

## 2022-07-21 NOTE — Progress Notes (Signed)
[facility] Progress Note   Subjective:   Doing well this AM-  seen on HD   Objective Vitals:   07/21/22 0800 07/21/22 0830 07/21/22 0900 07/21/22 0930  BP: 127/82 129/77 126/83 136/80  Pulse: 85 85 85 88  Resp: 20 (!) 23 (!) 24 (!) 23  Temp:      TempSrc:      SpO2: 97% 99% 96% 97%  Weight:      Height:       Physical Exam General: chronically ill appearing male in NAD Heart:RRR, no mrg Lungs:+crackles on R, otherwise CTAB, nml WOB on RA Abdomen:soft, NTND Extremities:no LE edema Dialysis Access: [facility]   Filed Weights   07/19/22 1542 07/19/22 1852 07/21/22 0745  Weight: 68.6 kg 66.6 kg 65.9 kg    Intake/Output Summary (Last 24 hours) at 07/21/2022 0949 Last data filed at 07/20/2022 1500 Gross per 24 hour  Intake 242.98 ml  Output --  Net 242.98 ml    Additional Objective Labs: Basic Metabolic Panel: Recent Labs  Lab 07/18/22 1653 07/18/22 2046 07/19/22 0454 07/20/22 0948 07/21/22 0144  NA 137 135  --  135 135  K 4.1 4.1  --  4.3 3.9  CL 97*  --   --  97* 95*  CO2 28  --   --  29 28  GLUCOSE 130*  --   --  104* 114*  BUN 51*  --   --  20 33*  CREATININE 6.05*  --   --  3.83* 4.94*  CALCIUM 8.6*  --   --  8.5* 8.4*  PHOS  --   --  3.2 3.4  --    Liver Function Tests: Recent Labs  Lab 07/18/22 1653 07/18/22 2045 07/20/22 0948  AST 18 19  --   ALT 11 11  --   ALKPHOS 68 59  --   BILITOT 0.5 0.7  --   PROT 6.2* 6.3*  --   ALBUMIN 2.7* 2.7* 2.6*   No results for input(s): "LIPASE", "AMYLASE" in the last 168 hours. CBC: Recent Labs  Lab 07/18/22 1653 07/18/22 2046 07/19/22 0454 07/20/22 0208  WBC 7.8  --  8.3 8.5  NEUTROABS 5.1  --   --   --   HGB 6.6* 7.1* 8.0* 8.0*  HCT 21.2* 21.0* 24.4* 24.3*  MCV 99.1  --  96.1 96.4  PLT 246  --  204 207    Cardiac Enzymes: Recent Labs  Lab 07/18/22 2045  CKTOTAL 106   CBG: Recent Labs  Lab 07/20/22 0745 07/20/22 1114 07/20/22 1700 07/20/22 2019 07/21/22 0732  GLUCAP 97  121* 133* 130* 107*   Iron Studies:  Recent Labs    07/18/22 2045  IRON 45  TIBC 238*  FERRITIN 185   Lab Results  Component Value Date   INR 1.7 (H) 07/21/2022   INR 2.2 (H) 07/20/2022   INR 2.1 (H) 07/19/2022   Studies/Results: No results found.  Medications:  sodium chloride     anticoagulant sodium citrate     ferric gluconate (FERRLECIT) IVPB Stopped (07/19/22 1658)    sodium chloride   Intravenous Once   aspirin EC  81 mg Oral Daily   atorvastatin  40 mg Oral QHS   carvedilol  6.25 mg Oral BID WC   Chlorhexidine Gluconate Cloth  6 each Topical Q0600   Chlorhexidine Gluconate Cloth  6 each Topical Q0600   citalopram  20 mg Oral QHS   clonazePAM  0.5 mg Oral  QHS   feeding supplement (NEPRO CARB STEADY)  237 mL Oral TID BM   guaiFENesin  600 mg Oral BID   heparin sodium (porcine)       hydrALAZINE  25 mg Oral BID   insulin aspart  0-6 Units Subcutaneous TID AC & HS   isosorbide dinitrate  20 mg Oral BID   multivitamin  1 tablet Oral QHS   QUEtiapine  300 mg Oral QHS   sodium chloride flush  3 mL Intravenous Q12H    Dialysis Orders: [place] 4hr TTS EDW presumably 70kg 3/2.25, NO UFP Heparin 2K Units Mircera 50 q2weeks (last given 10/14)   Background: 71 y.o. 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 He is presenting with worsening shortness of breath, orthopnea, poor exercise tolerance;  he has decreased urine output  CXR showed Interval increased prominence of the interstitial markings and increased patchy densities in both lungs. Small bilateral pleural effusions.    Assessment/Plan: Acute hypoxic resp failure - likely related to volume overload. CXR w/CHF and b/l pleural effusions.  HD overnight w/net  UF 4.2L over last 24 hrs.  Continue to titrate down volume as tolerated- better in this regard.  AKI on CKD 3b - baseline creatinine 1.6 prior to recent heart cath.   - HD today per regular schedule.  - Minimal to no UOP, no signs of renal recovery so far. HD dependent for 6 weeks- this likely means ESRD -Hx severe leg pain with dialysis. Order for Dilaudid 0.5 before HD and UF profile #2. At outpt HD he actually takes Oxycodone prescribed by cardiology for the leg pain per family.  HTN - home regimen low dose hydralazine, coreg and imdur; BP in goal w/o antihypertensives. d/c home lasix d/t minimal to no UOP. CAD - sp LHC on 8/22 at [facility] as above and seen by CTS. MRI last Friday and may need CABG with total occlusion in the LAD.  He may not recover renal function and may need to proceed with CABG with the designation of ESRD.  CTS consulted - no plans for surgery at this time, high risk.  His case to be discussed at multidisciplinary conference.  A/C systolic CHF - echo with EF 30-35%.  Meds limited by kidney function.  Volume removal with HD.  Anemia-  Hgb 8.0 s/p 1 unit pRBC. Tsat 19%, start iron load.  ESA recently dosed as OP but will give ESA here as well Bones-  CCa and phos in goal.  Not on binders or VDRA.  Nutrition - Currently on heart healthy/carb modified diet.  Add fluid restrictions. Alb 2.7 - start protein supplements.   [facility] 07/21/2022,9:49 AM  LOS: 3 days

## 2022-07-21 NOTE — Progress Notes (Signed)
Physical Therapy Treatment Patient Details Name: Allen Cortez MRN: [id number] DOB: July 05, 1951 Today's Date: 07/21/2022   History of Present Illness Pt is 71 yo male admitted 07/18/22 with SOB and LE edema. Workup for significant volume overload secondary to CHF, ESRD. S/p urgent HD with ultrafiltration 10/16. PMH includes ESRD (HD TTS), CHF, CAD, AAA repair, HTN, HLD, DM2, OSA, GERD, renal cell carcinoma with partial L nephrectomy, CKD3, abdominal wall mass, depression.   PT Comments    Pt progressing with mobility. Today's session focused on transfer and gait training with rollator; pt's main limiting factor with mobility is quick onset of bilateral thigh cramps with standing activity requiring seated rest break. Attempted various movements to alleviate symptoms, which pt reports feel best once supine; pt and son unsure reason for symptoms, which started recent hospitalization 05/2023 (son questions related to prolonged time on table for cardiac cath?); apparently vascular workup negative per son. Will continue to follow acutely to address established goals.    Recommendations for follow up therapy are one component of a multi-disciplinary discharge planning process, led by the attending physician.  Recommendations may be updated based on patient status, additional functional criteria and insurance authorization.  Follow Up Recommendations  Home health PT     Assistance Recommended at Discharge PRN  Patient can return home with the following Help with stairs or ramp for entrance;Assistance with cooking/housework   Equipment Recommendations  Rollator (4 wheels)    Recommendations for Other Services       Precautions / Restrictions Precautions Precautions: Fall;Other (comment) Precaution Comments: BLEs cramp requiring quick need to sit; wears 3L O2 baseline Restrictions Weight Bearing Restrictions: No     Mobility  Bed Mobility Overal bed mobility: Independent                   Transfers Overall transfer level: Modified independent Equipment used: None, Rollator (4 wheels) Transfers: Sit to/from Stand             General transfer comment: mod indep sit<>stand from EOb and rollator seat; educ on rollator brakes/safety    Ambulation/Gait Ambulation/Gait assistance: Supervision Gait Distance (Feet): 32 Feet (+ 32') Assistive device: Rollator (4 wheels) Gait Pattern/deviations: Step-through pattern, Decreased stride length, Trunk flexed       General Gait Details: pt ambulating 27' then reports needing to sit secondary to significant bilateral thigh cramps; prolonged seated rest break in attempts to recover from this, then ambulating back to room; declines further mobility secondary to discomfort. educ on locking/unlocking rollator brakes   Stairs             Wheelchair Mobility    Modified Rankin (Stroke Patients Only)       Balance Overall balance assessment: No apparent balance deficits (not formally assessed)   Sitting balance-Leahy Scale: Good Sitting balance - Comments: indep to don socks sitting EOB   Standing balance support: No upper extremity supported, During functional activity Standing balance-Leahy Scale: Fair Standing balance comment: preference for walker                            Cognition Arousal/Alertness: Awake/alert Behavior During Therapy: WFL for tasks assessed/performed Overall Cognitive Status: Within Functional Limits for tasks assessed  Exercises Other Exercises Other Exercises: seated piriformis stretch, seated LAQ/heel raises/marching in attempt to reduce cramping; supine lower back stretch    General Comments General comments (skin integrity, edema, etc.): SpO2 >/96% on 3L O2 [place]. pt's son [name]) present and supportive      Pertinent Vitals/Pain Pain Assessment Pain Assessment: Faces Faces Pain Scale: Hurts even  more Pain Location: bilateral thighs Pain Descriptors / Indicators: Grimacing, Cramping Pain Intervention(s): Monitored during session, Limited activity within patient's tolerance    Home Living                          Prior Function            PT Goals (current goals can now be found in the care plan section) Progress towards PT goals: Progressing toward goals    Frequency    Min 3X/week      PT Plan Current plan remains appropriate    Co-evaluation              AM-PAC PT "6 Clicks" Mobility   Outcome Measure  Help needed turning from your back to your side while in a flat bed without using bedrails?: None Help needed moving from lying on your back to sitting on the side of a flat bed without using bedrails?: None Help needed moving to and from a bed to a chair (including a wheelchair)?: A Little Help needed standing up from a chair using your arms (e.g., wheelchair or bedside chair)?: None Help needed to walk in hospital room?: A Little Help needed climbing 3-5 steps with a railing? : A Little 6 Click Score: 21    End of Session Equipment Utilized During Treatment: Oxygen Activity Tolerance: Patient tolerated treatment well;Patient limited by pain Patient left: in bed;with call bell/phone within reach;with family/visitor present Nurse Communication: Mobility status PT Visit Diagnosis: Pain;Difficulty in walking, not elsewhere classified (R26.2)     Time: 1541-1610 PT Time Calculation (min) (ACUTE ONLY): 29 min  Charges:  $Therapeutic Exercise: 8-22 mins $Therapeutic Activity: 8-22 mins                      Mabeline Caras, PT, DPT Acute Rehabilitation Services  Personal: [facility] Office: [place] 07/21/2022, 5:37 PM

## 2022-07-21 NOTE — Progress Notes (Addendum)
Progress Note   Patient: Allen Cortez YTK:[id number] DOB: 1951-02-03 DOA: 07/18/2022     3 DOS: the patient was seen and examined on 07/21/2022   Brief hospital course: [name] was admitted to the hospital with volume overload in the setting of ESRD.   71 yo male with the past medical history of CAD, systolic heart failure, LV thrombus, right renal cancer sp partial left nephrectomy, T2DM, ESRD and chronic hypoxemic respiratory failure who presented with dyspnea. At home he had progressive dyspnea and orthopnea despite renal replacement therapy with ultrafiltration on T-TH-Sat schedule. On his initial physical examination his blood pressure was 148/94, HT 94, lungs with no wheezing, heart with S1 and S2 present and rhythmic, abdomen with no distention, no lower extremity edema.   Na 137, K 4,1 CL 97 bicarbonate 28 glucose 130 bun 51 cr 6,0  BNP 2,398 High sensitive troponin 266, 271  Wbc 7,8 hgb 6,6 plt [place] 19 negative   Chest radiograph with cardiomegaly, bilateral interstitial infiltrates, symmetric, bilateral small pleural effusions, right IJ vein HD catheter in place.   EKG 85 bpm, left axis deviation, normal intervals, sinus rhythm with poor R to R wave progression, with no significant ST segment or T wave changes, positive LVH.   10/17 Patient had urgent HD with ultrafiltration  10/19 HD. Plan for multidisciplinary cardiovascular meeting tomorrow to decide if patient will benefit more from PCI than CABG.    Assessment and Plan: * Acute kidney injury (AKI) with acute tubular necrosis (ATN) ([place]) ESRD on HD.  Patient with improving volume status  Plan to continue ultrafiltration and hemodialysis per nephrology recommendations.  Acute on chronic hypoxemic respiratory failure due to acute pulmonary edema Oxygenation has improved 02 saturation today 100% on 3 L/min per [place].   Anemia of chronic renal disease with iron deficiency  Continue with EPO and iron.    Acute on chronic systolic CHF (congestive heart failure) (HCC)  Echocardiogram with reduced LV systolic function EF 30 to 35%, akinesis of the LV apical segment wall, inferior wall, anterior wall and lateral wall. Akinesis of the entire apical segment. Severe hypokinesis of the left ventricle, basal mid anterior wall. Preserved RV systolic function. RVSP 45.5 mmHg.  Systolic blood pressure  557 to 130 mmHg.   Continue close blood pressure monitoring.  Continue with carvedilol After load reduction with hydralazine and isosorbide.   Continue heparin for LV thrombus, transition to warfarin after completing cardiac workup.   Coronary atherosclerosis of native coronary artery Cardiac catheterization with 2 vessel disease, LAD 80%, Lcx.   Continue medical therapy with aspirin and B blockade. Statin therapy.  Cardiac MRI with mid anterior/ anteroseptal, apical, septal, inferior, lateral walls and apex with LGE consistent with prior infarct. LGE greater than 50%, suggesting nonviability in apical anterior, septal, inferior and lateral walls and apex.  Nonviability in mid to distal LAD territory   Patient likely not candidate for surgery revascularization CABG. Follow up with cardiology recommendations.  Continue medical therapy with aspirin and statin.   Type 2 diabetes mellitus with hyperlipidemia (HCC) Continue insulin sliding scale for glucose cover and monitoring Patient is tolerating po well.   GERD (gastroesophageal reflux disease) Continue with antiacid therapy.   PAD (peripheral artery disease) ([place]) Continue with statin therapy.         Subjective: Patient with no chest pain or dyspnea, he tolerated well HD today.   Physical Exam: Vitals:   07/21/22 1215 07/21/22 1400 07/21/22 1435 07/21/22 1455  BP: (!) 135/90 121/68 113/69   Pulse: 93 93 96 98  Resp: (!) 25 (!) 28 18   Temp: 97.6 F (36.4 C) 98.9 F (37.2 C)    TempSrc:  Oral    SpO2: 100% 99% 99% 100%   Weight: 64.6 kg     Height:       Neurology awake and alert ENT with mild pallor Cardiovascular with S1 and S2 present and rhythmic Respiratory with no rales or wheezing Abdomen with no distention  No lower extremity edema  Data Reviewed:    Family Communication: I spoke with patient's son at the bedside, we talked in detail about patient's condition, plan of care and prognosis and all questions were addressed.   Disposition: Status is: Inpatient Remains inpatient appropriate because: complete cardiac workup   Planned Discharge Destination: Home    Author: Tawni Millers, MD 07/21/2022 3:36 PM  For on call review [email or web address].

## 2022-07-21 NOTE — Progress Notes (Signed)
   07/21/22 1215  Vitals  Temp 97.6 F (36.4 C)  Pulse Rate 93  Resp (!) 25  BP (!) 135/90  SpO2 100 %  O2 Device Nasal Cannula  Weight 64.6 kg  Type of Weight Post-Dialysis  Oxygen Therapy  O2 Flow Rate (L/min) 3 L/min  Patient Activity (if Appropriate) In chair  Pulse Oximetry Type Continuous  Oximetry Probe Site Changed No  Post Treatment  Dialyzer Clearance Clear  Duration of HD Treatment -hour(s) 4 hour(s)  Hemodialysis Intake (mL) 100 mL  Liters Processed 84.6  Fluid Removed 1500 mL  Tolerated HD Treatment Yes   Received patient in bed to unit.  Alert and oriented.  Informed consent signed and in chart.   Treatment initiated: 0800 Treatment completed: 1200  Patient tolerated well.  Transported back to the room  Alert, without acute distress.  Hand-off given to patient's nurse.   Access used: [facility] Access issues: none    [facility]

## 2022-07-21 NOTE — Progress Notes (Signed)
PT Cancellation Note  Patient Details Name: Allen Cortez MRN: [id number] DOB: 1951-01-29   Cancelled Treatment:    Reason Eval/Treat Not Completed: Patient at procedure or test/unavailable (HD). Will follow-up for PT treatment as schedule permits.  Mabeline Caras, PT, DPT Acute Rehabilitation Services  Personal: [facility] Office: [place] 07/21/2022, 9:02 AM

## 2022-07-21 NOTE — Procedures (Signed)
Patient was seen on dialysis and the procedure was supervised.  BFR 350  Via TDC BP is  130/70.   Patient appears to be tolerating treatment well  Louis Meckel 07/21/2022

## 2022-07-21 NOTE — Progress Notes (Signed)
Rounding Note    Patient Name: Allen Cortez Date of Encounter: 07/21/2022  [place] Cardiologist: Shelva Majestic, MD   Subjective   Patient seen in HD unit.  He finally had his bowel movement yesterday and feeling better overall.  Breathing is definitely improving.  A bit tired but overall feeling better.  Inpatient Medications    Scheduled Meds:  sodium chloride   Intravenous Once   aspirin EC  81 mg Oral Daily   atorvastatin  40 mg Oral QHS   carvedilol  6.25 mg Oral BID WC   Chlorhexidine Gluconate Cloth  6 each Topical Q0600   Chlorhexidine Gluconate Cloth  6 each Topical Q0600   citalopram  20 mg Oral QHS   clonazePAM  0.5 mg Oral QHS   [START ON 07/23/2022] darbepoetin (ARANESP) injection - DIALYSIS  150 mcg Intravenous Q Sat-HD   feeding supplement (NEPRO CARB STEADY)  237 mL Oral TID BM   guaiFENesin  600 mg Oral BID   heparin sodium (porcine)       hydrALAZINE  25 mg Oral BID   insulin aspart  0-6 Units Subcutaneous TID AC & HS   isosorbide dinitrate  20 mg Oral BID   multivitamin  1 tablet Oral QHS   QUEtiapine  300 mg Oral QHS   sodium chloride flush  3 mL Intravenous Q12H   Continuous Infusions:  sodium chloride     anticoagulant sodium citrate     ferric gluconate (FERRLECIT) IVPB Stopped (07/19/22 1658)   PRN Meds: sodium chloride, acetaminophen **OR** acetaminophen, albuterol, alteplase, anticoagulant sodium citrate, bisacodyl, heparin, heparin, heparin sodium (porcine), sodium chloride flush   Vital Signs    Vitals:   07/21/22 1100 07/21/22 1130 07/21/22 1200 07/21/22 1215  BP: 134/88 133/89 127/84 (!) 135/90  Pulse: 86 93 98 93  Resp: (!) 23 (!) 24 (!) 27 (!) 25  Temp:    97.6 F (36.4 C)  TempSrc:      SpO2: 99% 97% 99% 100%  Weight:    64.6 kg  Height:        Intake/Output Summary (Last 24 hours) at 07/21/2022 1242 Last data filed at 07/21/2022 1215 Gross per 24 hour  Intake 242.98 ml  Output 1500 ml  Net -1257.02 ml       07/21/2022   12:15 PM 07/21/2022    7:45 AM 07/19/2022    6:52 PM  Last 3 Weights  Weight (lbs) 142 lb 6.7 oz 145 lb 4.5 oz 146 lb 13.2 oz  Weight (kg) 64.6 kg 65.9 kg 66.6 kg      Telemetry    Sinus Rhythm - Personally Reviewed  ECG    No new tracing  Physical Exam   GEN: Appears older than stated health.  Chronically ill-appearing.  But no obvious distress. Neck: Unable to truly assess but does not appear to have significant JVD. Cardiac: RRR, no R/G.  Difficult to assess if that is true murmur or simply from the HD catheter. Respiratory: Labored.  Mostly CTA B. GI: Soft, nontender, non-distended  MS: No edema; No deformity. Neuro:  Nonfocal  Psych: Normal affect   Labs    High Sensitivity Troponin:   Recent Labs  Lab 07/18/22 1653 07/18/22 1845 07/18/22 2045 07/19/22 0454  TROPONINIHS 266* 271* 256* 288*     Chemistry Recent Labs  Lab 07/18/22 1653 07/18/22 2045 07/18/22 2046 07/20/22 0948 07/21/22 0144  NA 137  --  135 135 135  K 4.1  --  4.1 4.3 3.9  CL 97*  --   --  97* 95*  CO2 28  --   --  29 28  GLUCOSE 130*  --   --  104* 114*  BUN 51*  --   --  20 33*  CREATININE 6.05*  --   --  3.83* 4.94*  CALCIUM 8.6*  --   --  8.5* 8.4*  PROT 6.2* 6.3*  --   --   --   ALBUMIN 2.7* 2.7*  --  2.6*  --   AST 18 19  --   --   --   ALT 11 11  --   --   --   ALKPHOS 68 59  --   --   --   BILITOT 0.5 0.7  --   --   --   GFRNONAA 9*  --   --  16* 12*  ANIONGAP 12  --   --  9 12    Lipids No results for input(s): "CHOL", "TRIG", "HDL", "LABVLDL", "LDLCALC", "CHOLHDL" in the last 168 hours.  Hematology Recent Labs  Lab 07/18/22 1653 07/18/22 2046 07/19/22 0454 07/20/22 0208  WBC 7.8  --  8.3 8.5  RBC 2.14*  --  2.54*  1.40* 2.52*  HGB 6.6* 7.1* 8.0* 8.0*  HCT 21.2* 21.0* 24.4* 24.3*  MCV 99.1  --  96.1 96.4  MCH 30.8  --  31.5 31.7  MCHC 31.1  --  32.8 32.9  RDW 16.9*  --  16.1* 16.5*  PLT 246  --  204 207   Thyroid  Recent Labs  Lab  07/18/22 2040  TSH 1.676    BNP Recent Labs  Lab 07/18/22 1653  BNP 2,398.5*    DDimer No results for input(s): "DDIMER" in the last 168 hours.   Radiology    No results found.  Cardiac Studies   ECHO 06/12/2022:  1. 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 Left ventricular ejection fraction, by estimation, is 30-35%. The left ventricle is abnormal. The left ventricle has focal regional wall motion abnormalities. The left ventricular internal cavity size was mildly dilated. There is akinesis of  the left ventricular, apical septal wall, inferior wall, anterior wall and lateral wall. There is akinesis of the left ventricular, entire apical segment. There is severe hypokinesis of the left ventricular, basal-mid anterior wall. A falst tendon is present in the mid LV cavity.   2. Right ventricular systolic function is normal. The right ventricular size is normal. There is moderately elevated pulmonary artery systolic pressure. The estimated right ventricular systolic pressure is 02.7 mmHg.   3. The mitral valve is normal in structure. Mild mitral valve regurgitation. No evidence of mitral stenosis.   4. The aortic valve is tricuspid. There is moderate calcification of the aortic valve. Aortic valve regurgitation is not visualized. Aortic valve sclerosis/calcification is present, without any evidence of aortic stenosis.   5. The inferior vena cava is dilated in size with >50% respiratory variability, suggesting right atrial pressure of 8 mmHg.      CMR 07/15/2022: 1. Subendocardial LGE consistent with prior infarct in mid anterior/anteroseptal, apical anterior/septal/inferior/lateral walls, and apex. LGE is greater than 50% transmural suggesting nonviability in apical anterior/septal/inferior/lateral walls and apex. LGE <50% transmural in mid anterior wall but wall thickness <4.15m suggests  nonviability. Findings are consistent with nonviability in mid to distal LAD  territory 2. Severe LV dilatation with severe systolic dysfunction (EF 27%). Thinning/akinesis of mid to apical anterior/anteroseptal walls and apical inferior/lateral walls. Aneurysm of apex. 3.  LV apical thrombus measures 17m x 732m4.  Normal RV size and systolic function (EF 4878%5. Diffuse pulmonary opacities and bilateral pleural effusions (moderate on right, small on left), consider CT chest for further evaluation.    Patient Profile     [age].o. male with a PMHx of nonobstructive CAD 2011, AAA s/p repair 2013, HTN, HLD, T2DM, RCC s/p partial left nephrectomy 2015, OSA, GERD, RLS. who is being seen 07/18/2022 for the evaluation of acute on chronic HFrEF, NSTEMI and known CAD at the request of the primary hospitalist service.  Assessment & Plan    Acute hypoxic respiratory failure ESRD on HD -- Initial presentation was been significant volume overload having missed a day of dialysis.  Nephrology on board.  Currently getting dialysis now.  Feeling better   HFrEF -ICM => WITH ACUTE ON CHRONIC COMBINED SYSTOLIC AND DIASTOLIC HEART FAILURE Echo 06/12/22 LVEF 30-35% with clear evidence of LV thrombus. Akinesis of the anterior wall. No significant valve disease -- volume management per HD  -- on coreg 6.'25mg'$  BID, hydralazine '50mg'$  TID, isordil '20mg'$  BID -- currently no ACE/ARB/ARNi, and no SGLT2 inhibitor with renal disease.   CAD w/ Angina & Elevated troponin /Demand Ischemia -type II MI/supply versus demand ischemia from combined CHF, known CAD and ESRD) -- [facility] [place] with CTO of the LAD and 80% Lcx. Was awaiting TCTS input regarding options. Outpatient cardiac MRI 10/13 without viable myocardium. Have requested formal TCTS consult -- hsTn 266>>271 in the setting of known CAD with volume overload -- continue ASA, statin, BB -> add Isordil to the hydralazine  Plan for now is to  discuss his case at our heart team meeting on Friday morning, Dr. LiKipp Broods asking for guidance from interventional cardiology.  More than likely the plan will be not to proceed with CABG and therefore would potentially plan on PCI of the LCx.  We have tentatively scheduled him for PCI on Thursday with Dr. JoMartinique He can be loaded with Plavix in the morning.   I talked to the patient of this and he is fully in agreement.  [facility] -- coumadin held, I with INR now less than 2, will start IV heparin.   HTN -> stable -- continue coreg 6.'25mg'$  BID, hydralazine '50mg'$  TID, Isordil 20 mg   HLD -- recent LDL 60 -- Continue with atorvastatin 40 mg daily.    Bilateral LE pain -- Significant bilateral leg pain which is worse with exertion but also at rest when not wearing nasal cannula oxygen.  Not sure if related to true claudication or simply just low oxygen levels from heart failure. Recent LE dopplers (07/11/2022) normal. Low threshold to consider ABIs   For questions or updates, please contact [place] consult [email or web address] for contact info under        Signed, DaGlenetta HewMD  07/21/2022, 12:42 PM      DaLeonie ManMD, MS DaGlenetta HewM.D., M.S. Interventional Cardiologist  COSullyPager # [phone] # [phone] [address] [id number]

## 2022-07-21 NOTE — Progress Notes (Signed)
Mobility Specialist - Progress Note   07/21/22 0950  Mobility  Activity Contraindicated/medical hold   Pt off unit at hemodialysis. Will follow up if time permits.  Larey Seat

## 2022-07-21 NOTE — Telephone Encounter (Signed)
Wife called stating Dr. Ellyn Hack entered on the patient's hospital notes today that the patient missed a day of dialysis.  Wife stated patient had dialysis on Saturday and patient was admitted on Monday.  Wife would like this noted prior to the patient's team meeting tomorrow.

## 2022-07-21 NOTE — Progress Notes (Signed)
Pt in bed stable for HD

## 2022-07-21 NOTE — Telephone Encounter (Signed)
This would not make a difference when it comes to decision made tomorrow.  -> I think he didn't miss (as not go to) his HD sessions, but was not able to complete.  Not his fault & will not alter our plan.  We are discussing weather or not he would be able to benefit from CABG based upon Cardiac MRI - looking at his cath films.  - If not, ? Do we consider PCI.  I talked with the patient about this today.  [place]

## 2022-07-21 NOTE — H&P (View-Only) (Signed)
Rounding Note    Patient Name: Allen Cortez Date of Encounter: 07/21/2022  [place] Cardiologist: Shelva Majestic, MD   Subjective   Patient seen in HD unit.  He finally had his bowel movement yesterday and feeling better overall.  Breathing is definitely improving.  A bit tired but overall feeling better.  Inpatient Medications    Scheduled Meds:  sodium chloride   Intravenous Once   aspirin EC  81 mg Oral Daily   atorvastatin  40 mg Oral QHS   carvedilol  6.25 mg Oral BID WC   Chlorhexidine Gluconate Cloth  6 each Topical Q0600   Chlorhexidine Gluconate Cloth  6 each Topical Q0600   citalopram  20 mg Oral QHS   clonazePAM  0.5 mg Oral QHS   [START ON 07/23/2022] darbepoetin (ARANESP) injection - DIALYSIS  150 mcg Intravenous Q Sat-HD   feeding supplement (NEPRO CARB STEADY)  237 mL Oral TID BM   guaiFENesin  600 mg Oral BID   heparin sodium (porcine)       hydrALAZINE  25 mg Oral BID   insulin aspart  0-6 Units Subcutaneous TID AC & HS   isosorbide dinitrate  20 mg Oral BID   multivitamin  1 tablet Oral QHS   QUEtiapine  300 mg Oral QHS   sodium chloride flush  3 mL Intravenous Q12H   Continuous Infusions:  sodium chloride     anticoagulant sodium citrate     ferric gluconate (FERRLECIT) IVPB Stopped (07/19/22 1658)   PRN Meds: sodium chloride, acetaminophen **OR** acetaminophen, albuterol, alteplase, anticoagulant sodium citrate, bisacodyl, heparin, heparin, heparin sodium (porcine), sodium chloride flush   Vital Signs    Vitals:   07/21/22 1100 07/21/22 1130 07/21/22 1200 07/21/22 1215  BP: 134/88 133/89 127/84 (!) 135/90  Pulse: 86 93 98 93  Resp: (!) 23 (!) 24 (!) 27 (!) 25  Temp:    97.6 F (36.4 C)  TempSrc:      SpO2: 99% 97% 99% 100%  Weight:    64.6 kg  Height:        Intake/Output Summary (Last 24 hours) at 07/21/2022 1242 Last data filed at 07/21/2022 1215 Gross per 24 hour  Intake 242.98 ml  Output 1500 ml  Net -1257.02 ml       07/21/2022   12:15 PM 07/21/2022    7:45 AM 07/19/2022    6:52 PM  Last 3 Weights  Weight (lbs) 142 lb 6.7 oz 145 lb 4.5 oz 146 lb 13.2 oz  Weight (kg) 64.6 kg 65.9 kg 66.6 kg      Telemetry    Sinus Rhythm - Personally Reviewed  ECG    No new tracing  Physical Exam   GEN: Appears older than stated health.  Chronically ill-appearing.  But no obvious distress. Neck: Unable to truly assess but does not appear to have significant JVD. Cardiac: RRR, no R/G.  Difficult to assess if that is true murmur or simply from the HD catheter. Respiratory: Labored.  Mostly CTA B. GI: Soft, nontender, non-distended  MS: No edema; No deformity. Neuro:  Nonfocal  Psych: Normal affect   Labs    High Sensitivity Troponin:   Recent Labs  Lab 07/18/22 1653 07/18/22 1845 07/18/22 2045 07/19/22 0454  TROPONINIHS 266* 271* 256* 288*     Chemistry Recent Labs  Lab 07/18/22 1653 07/18/22 2045 07/18/22 2046 07/20/22 0948 07/21/22 0144  NA 137  --  135 135 135  K 4.1  --  4.1 4.3 3.9  CL 97*  --   --  97* 95*  CO2 28  --   --  29 28  GLUCOSE 130*  --   --  104* 114*  BUN 51*  --   --  20 33*  CREATININE 6.05*  --   --  3.83* 4.94*  CALCIUM 8.6*  --   --  8.5* 8.4*  PROT 6.2* 6.3*  --   --   --   ALBUMIN 2.7* 2.7*  --  2.6*  --   AST 18 19  --   --   --   ALT 11 11  --   --   --   ALKPHOS 68 59  --   --   --   BILITOT 0.5 0.7  --   --   --   GFRNONAA 9*  --   --  16* 12*  ANIONGAP 12  --   --  9 12    Lipids No results for input(s): "CHOL", "TRIG", "HDL", "LABVLDL", "LDLCALC", "CHOLHDL" in the last 168 hours.  Hematology Recent Labs  Lab 07/18/22 1653 07/18/22 2046 07/19/22 0454 07/20/22 0208  WBC 7.8  --  8.3 8.5  RBC 2.14*  --  2.54*  1.40* 2.52*  HGB 6.6* 7.1* 8.0* 8.0*  HCT 21.2* 21.0* 24.4* 24.3*  MCV 99.1  --  96.1 96.4  MCH 30.8  --  31.5 31.7  MCHC 31.1  --  32.8 32.9  RDW 16.9*  --  16.1* 16.5*  PLT 246  --  204 207   Thyroid  Recent Labs  Lab  07/18/22 2040  TSH 1.676    BNP Recent Labs  Lab 07/18/22 1653  BNP 2,398.5*    DDimer No results for input(s): "DDIMER" in the last 168 hours.   Radiology    No results found.  Cardiac Studies   ECHO 06/12/2022:  1. 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 Left ventricular ejection fraction, by estimation, is 30-35%. The left ventricle is abnormal. The left ventricle has focal regional wall motion abnormalities. The left ventricular internal cavity size was mildly dilated. There is akinesis of  the left ventricular, apical septal wall, inferior wall, anterior wall and lateral wall. There is akinesis of the left ventricular, entire apical segment. There is severe hypokinesis of the left ventricular, basal-mid anterior wall. A falst tendon is present in the mid LV cavity.   2. Right ventricular systolic function is normal. The right ventricular size is normal. There is moderately elevated pulmonary artery systolic pressure. The estimated right ventricular systolic pressure is 16.6 mmHg.   3. The mitral valve is normal in structure. Mild mitral valve regurgitation. No evidence of mitral stenosis.   4. The aortic valve is tricuspid. There is moderate calcification of the aortic valve. Aortic valve regurgitation is not visualized. Aortic valve sclerosis/calcification is present, without any evidence of aortic stenosis.   5. The inferior vena cava is dilated in size with >50% respiratory variability, suggesting right atrial pressure of 8 mmHg.      CMR 07/15/2022: 1. Subendocardial LGE consistent with prior infarct in mid anterior/anteroseptal, apical anterior/septal/inferior/lateral walls, and apex. LGE is greater than 50% transmural suggesting nonviability in apical anterior/septal/inferior/lateral walls and apex. LGE <50% transmural in mid anterior wall but wall thickness <4.24m suggests  nonviability. Findings are consistent with nonviability in mid to distal LAD  territory 2. Severe LV dilatation with severe systolic dysfunction (EF 67%). Thinning/akinesis of mid to apical anterior/anteroseptal walls and apical inferior/lateral walls. Aneurysm of apex. 3.  LV apical thrombus measures 58m x 732m4.  Normal RV size and systolic function (EF 4812%5. Diffuse pulmonary opacities and bilateral pleural effusions (moderate on right, small on left), consider CT chest for further evaluation.    Patient Profile     [age].o. male with a PMHx of nonobstructive CAD 2011, AAA s/p repair 2013, HTN, HLD, T2DM, RCC s/p partial left nephrectomy 2015, OSA, GERD, RLS. who is being seen 07/18/2022 for the evaluation of acute on chronic HFrEF, NSTEMI and known CAD at the request of the primary hospitalist service.  Assessment & Plan    Acute hypoxic respiratory failure ESRD on HD -- Initial presentation was been significant volume overload having missed a day of dialysis.  Nephrology on board.  Currently getting dialysis now.  Feeling better   HFrEF -ICM => WITH ACUTE ON CHRONIC COMBINED SYSTOLIC AND DIASTOLIC HEART FAILURE Echo 06/12/22 LVEF 30-35% with clear evidence of LV thrombus. Akinesis of the anterior wall. No significant valve disease -- volume management per HD  -- on coreg 6.'25mg'$  BID, hydralazine '50mg'$  TID, isordil '20mg'$  BID -- currently no ACE/ARB/ARNi, and no SGLT2 inhibitor with renal disease.   CAD w/ Angina & Elevated troponin /Demand Ischemia -type II MI/supply versus demand ischemia from combined CHF, known CAD and ESRD) [place] [facility] with CTO of the LAD and 80% Lcx. Was awaiting TCTS input regarding options. Outpatient cardiac MRI 10/13 without viable myocardium. Have requested formal TCTS consult -- hsTn 266>>271 in the setting of known CAD with volume overload -- continue ASA, statin, BB -> add Isordil to the hydralazine  Plan for now is to  discuss his case at our heart team meeting on Friday morning, Dr. LiKipp Broods asking for guidance from interventional cardiology.  More than likely the plan will be not to proceed with CABG and therefore would potentially plan on PCI of the LCx.  We have tentatively scheduled him for PCI on Thursday with Dr. JoMartinique He can be loaded with Plavix in the morning.   I talked to the patient of this and he is fully in agreement.  [facility] -- coumadin held, I with INR now less than 2, will start IV heparin.   HTN -> stable -- continue coreg 6.'25mg'$  BID, hydralazine '50mg'$  TID, Isordil 20 mg   HLD -- recent LDL 60 -- Continue with atorvastatin 40 mg daily.    Bilateral LE pain -- Significant bilateral leg pain which is worse with exertion but also at rest when not wearing nasal cannula oxygen.  Not sure if related to true claudication or simply just low oxygen levels from heart failure. Recent LE dopplers (07/11/2022) normal. Low threshold to consider ABIs   For questions or updates, please contact [place] consult [email or web address] for contact info under        Signed, DaGlenetta HewMD  07/21/2022, 12:42 PM      DaLeonie ManMD, MS DaGlenetta HewM.D., M.S. Interventional Cardiologist  COEaglePager # [phone] # [phone] [address]

## 2022-07-21 NOTE — Progress Notes (Signed)
ANTICOAGULATION CONSULT NOTE  Pharmacy Consult for heparin  Indication: LV thrombus  Allergies  Allergen Reactions   Dilaudid [Hydromorphone Hcl] Other (See Comments)    hallucinations   Mirapex [Pramipexole Dihydrochloride] Other (See Comments)    Leg pain   Prednisone Other (See Comments)    irritability   Gabapentin     Other reaction(s): Tremor    Patient Measurements: Height: '5\' 10"'$  (177.8 cm) Weight: 64.6 kg (142 lb 6.7 oz) IBW/kg (Calculated) : 73  Vital Signs: Temp: 98.3 F (36.8 C) (10/19 2000) Temp Source: Oral (10/19 2000) BP: 119/78 (10/19 2136) Pulse Rate: 96 (10/19 2000)  Labs: Recent Labs    07/19/22 0454 07/20/22 0208 07/20/22 0948 07/21/22 0144 07/21/22 0737 07/21/22 0810 07/21/22 2234  HGB 8.0* 8.0*  --   --   --  7.4*  --   HCT 24.4* 24.3*  --   --   --  23.2*  --   PLT 204 207  --   --   --  174  --   LABPROT 23.7* 24.4*  --   --  19.7*  --   --   INR 2.1* 2.2*  --   --  1.7*  --   --   HEPARINUNFRC  --   --   --   --   --   --  <0.10*  CREATININE  --   --  3.83* 4.94*  --   --   --   TROPONINIHS 288*  --   --   --   --   --   --      Estimated Creatinine Clearance: 12.4 mL/min (A) (by C-G formula based on SCr of 4.94 mg/dL (H)).   Medical History: Past Medical History:  Diagnosis Date   AAA (abdominal aortic aneurysm) (HCC)    Abdominal wall mass of left lower quadrant    Basal cell carcinoma    CAD (coronary artery disease) 2011   moderate   Colon polyp    DDD (degenerative disc disease)    Depression    Diabetes mellitus type II    no meds   GERD (gastroesophageal reflux disease)    Hyperlipidemia    Neuromuscular disorder (HCC)    Osteopenia    PAD (peripheral artery disease) (HCC)    Renal cell carcinoma    Restless leg syndrome    Sleep apnea    used a cpap yr ago-does not snore or use it now.    Medications:  Medications Prior to Admission  Medication Sig Dispense Refill Last Dose   acetaminophen (TYLENOL) 325  MG tablet Take 2 tablets (650 mg total) by mouth every 4 (four) hours as needed for headache or mild pain.   Past Month   aspirin EC 81 MG tablet Take 1 tablet (81 mg total) by mouth daily. Swallow whole. 30 tablet 3 07/18/2022   atorvastatin (LIPITOR) 40 MG tablet Take 40 mg by mouth at bedtime.   07/17/2022   B Complex-C-Zn-Folic Acid (DIALYVITE 789-FYBO 15) 0.8 MG TABS Take 1 tablet by mouth 3 (three) times daily as needed (with meals).   Past Week   carvedilol (COREG) 6.25 MG tablet Take 1 tablet (6.25 mg total) by mouth 2 (two) times daily with a meal. 180 tablet 3 07/18/2022 at 0900   citalopram (CELEXA) 40 MG tablet Take 40 mg by mouth every evening.   07/17/2022   clonazePAM (KLONOPIN) 0.5 MG tablet Take 0.5 mg by mouth at bedtime.   07/17/2022  furosemide (LASIX) 40 MG tablet Take 1 tablet (40 mg total) by mouth 2 (two) times daily. 180 tablet 3 07/18/2022   hydrALAZINE (APRESOLINE) 25 MG tablet Take 1 tablet (25 mg total) by mouth every 8 (eight) hours. 270 tablet 3 07/18/2022   isosorbide mononitrate (IMDUR) 30 MG 24 hr tablet Take 0.5 tablets (15 mg total) by mouth daily. 45 tablet 3 07/18/2022   lansoprazole (PREVACID) 15 MG capsule Take 15 mg by mouth every morning.   07/18/2022   multivitamin (RENA-VIT) TABS tablet Take 1 tablet by mouth at bedtime. 30 tablet 0 07/17/2022   Nutritional Supplements (NEPRO) LIQD Take 237 mLs by mouth 2 (two) times daily.   07/18/2022   QUEtiapine (SEROQUEL) 300 MG tablet Take 300 mg by mouth at bedtime.   07/17/2022   warfarin (COUMADIN) 5 MG tablet Take 1 tablet daily Monday, Wednesday, Friday, Saturday and Sunday and take half tablet daily on Tuesday and Thursday. (Patient taking differently: Take 2.5-5 mg by mouth See admin instructions. Take 2.5 mg (1/2 tablet) by mouth daily every day of the week except for Monday's. On Monday's take 5 mg (1 tablet).) 30 tablet 0 07/17/2022 at 2000   feeding supplement (ENSURE ENLIVE / ENSURE PLUS) LIQD Take 237 mLs  by mouth 2 (two) times daily between meals. (Patient not taking: Reported on 07/18/2022) 14220 mL 0 Not Taking    Assessment: 71 yo male on warfarin PTA for recent LV thrombus. He is also noted with ESRD and s/p emergent HD.  Plans are for possible CABG/PCI and pharmacy to start heparin when INR is < 2.0 -INR= 1.7, hg= 8  10/19 PM update:  Heparin level low Likely cath tomorrow   Goal of Therapy:  Heparin level 0.3-0.7 units/ml Monitor platelets by anticoagulation protocol: Yes   Plan:  -Inc heparin to 1050 units/hr -Heparin level with AM labs   Narda Bonds, PharmD, BCPS Clinical Pharmacist Phone: [phone]

## 2022-07-22 ENCOUNTER — Inpatient Hospital Stay (HOSPITAL_COMMUNITY)
Admission: EM | Disposition: A | Discharge status: Hospice | Payer: Self-pay | Source: Home / Self Care | Attending: Internal Medicine

## 2022-07-22 ENCOUNTER — Ambulatory Visit: Payer: No Typology Code available for payment source | Admitting: Thoracic Surgery (Cardiothoracic Vascular Surgery)

## 2022-07-22 DIAGNOSIS — Z7901 Long term (current) use of anticoagulants: Secondary | ICD-10-CM

## 2022-07-22 DIAGNOSIS — K219 Gastro-esophageal reflux disease without esophagitis: Secondary | ICD-10-CM | POA: Diagnosis not present

## 2022-07-22 DIAGNOSIS — I25119 Atherosclerotic heart disease of native coronary artery with unspecified angina pectoris: Secondary | ICD-10-CM | POA: Diagnosis not present

## 2022-07-22 DIAGNOSIS — I5023 Acute on chronic systolic (congestive) heart failure: Secondary | ICD-10-CM | POA: Diagnosis not present

## 2022-07-22 DIAGNOSIS — F332 Major depressive disorder, recurrent severe without psychotic features: Secondary | ICD-10-CM

## 2022-07-22 DIAGNOSIS — N170 Acute kidney failure with tubular necrosis: Secondary | ICD-10-CM | POA: Diagnosis not present

## 2022-07-22 HISTORY — PX: CORONARY STENT INTERVENTION: CATH118234

## 2022-07-22 LAB — CBC
HCT: 25.2 % — ABNORMAL LOW (ref 39.0–52.0)
HCT: 24.6 % — ABNORMAL LOW (ref 39.0–52.0)
Hemoglobin: 7.7 g/dL — ABNORMAL LOW (ref 13.0–17.0)
Hemoglobin: 7.8 g/dL — ABNORMAL LOW (ref 13.0–17.0)
MCH: 30.7 pg (ref 26.0–34.0)
MCH: 31.7 pg (ref 26.0–34.0)
MCHC: 30.6 g/dL (ref 30.0–36.0)
MCHC: 31.7 g/dL (ref 30.0–36.0)
MCV: 100.4 fL — ABNORMAL HIGH (ref 80.0–100.0)
MCV: 100 fL (ref 80.0–100.0)
Platelets: 213 10*3/uL (ref 150–400)
Platelets: 212 10*3/uL (ref 150–400)
RBC: 2.51 MIL/uL — ABNORMAL LOW (ref 4.22–5.81)
RBC: 2.46 MIL/uL — ABNORMAL LOW (ref 4.22–5.81)
RDW: 17.2 % — ABNORMAL HIGH (ref 11.5–15.5)
RDW: 17.3 % — ABNORMAL HIGH (ref 11.5–15.5)
WBC: 10.8 10*3/uL — ABNORMAL HIGH (ref 4.0–10.5)
WBC: 7.2 10*3/uL (ref 4.0–10.5)
nRBC: 0.2 % (ref 0.0–0.2)
nRBC: 0.3 % — ABNORMAL HIGH (ref 0.0–0.2)

## 2022-07-22 LAB — BASIC METABOLIC PANEL
Anion gap: 12 (ref 5–15)
BUN: 20 mg/dL (ref 8–23)
CO2: 28 mmol/L (ref 22–32)
Calcium: 8.4 mg/dL — ABNORMAL LOW (ref 8.9–10.3)
Chloride: 95 mmol/L — ABNORMAL LOW (ref 98–111)
Creatinine, Ser: 3.97 mg/dL — ABNORMAL HIGH (ref 0.61–1.24)
GFR, Estimated: 15 mL/min — ABNORMAL LOW (ref >60)
Glucose, Bld: 94 mg/dL (ref 70–99)
Potassium: 3.7 mmol/L (ref 3.5–5.1)
Sodium: 135 mmol/L (ref 135–145)

## 2022-07-22 LAB — GLUCOSE, CAPILLARY
Glucose-Capillary: 126 mg/dL — ABNORMAL HIGH (ref 70–99)
Glucose-Capillary: 136 mg/dL — ABNORMAL HIGH (ref 70–99)
Glucose-Capillary: 107 mg/dL — ABNORMAL HIGH (ref 70–99)
Glucose-Capillary: 113 mg/dL — ABNORMAL HIGH (ref 70–99)
Glucose-Capillary: 118 mg/dL — ABNORMAL HIGH (ref 70–99)
Glucose-Capillary: 134 mg/dL — ABNORMAL HIGH (ref 70–99)

## 2022-07-22 LAB — HEMOGLOBIN AND HEMATOCRIT, BLOOD
HCT: 26.7 % — ABNORMAL LOW (ref 39.0–52.0)
Hemoglobin: 8.7 g/dL — ABNORMAL LOW (ref 13.0–17.0)

## 2022-07-22 LAB — PROTIME-INR
INR: 1.4 — ABNORMAL HIGH (ref 0.8–1.2)
Prothrombin Time: 16.7 seconds — ABNORMAL HIGH (ref 11.4–15.2)

## 2022-07-22 LAB — HEPARIN LEVEL (UNFRACTIONATED): Heparin Unfractionated: 0.15 IU/mL — ABNORMAL LOW (ref 0.30–0.70)

## 2022-07-22 LAB — PREPARE RBC (CROSSMATCH)

## 2022-07-22 LAB — POCT ACTIVATED CLOTTING TIME: Activated Clotting Time: 323 seconds

## 2022-07-22 SURGERY — CORONARY STENT INTERVENTION
Anesthesia: LOCAL

## 2022-07-22 MED ORDER — FENTANYL CITRATE (PF) 100 MCG/2ML IJ SOLN
INTRAMUSCULAR | Status: AC
  Filled 2022-07-22: qty 2

## 2022-07-22 MED ORDER — SODIUM CHLORIDE 0.9% IV SOLUTION: Freq: Once | INTRAVENOUS | Status: DC

## 2022-07-22 MED ORDER — SODIUM CHLORIDE 0.9% FLUSH: 3.0000 mL | INTRAVENOUS | Status: DC | PRN

## 2022-07-22 MED ORDER — CHLORHEXIDINE GLUCONATE CLOTH 2 % EX PADS
6.0000 | MEDICATED_PAD | Freq: Every day | CUTANEOUS | Status: DC
  Administered 2022-07-22 – 2022-07-25 (×4): 6 via TOPICAL

## 2022-07-22 MED ORDER — PANTOPRAZOLE SODIUM 40 MG IV SOLR
40.0000 mg | Freq: Every day | INTRAVENOUS | Status: DC
  Administered 2022-07-22: 40 mg via INTRAVENOUS
  Filled 2022-07-22: qty 10

## 2022-07-22 MED ORDER — MIDAZOLAM HCL 2 MG/2ML IJ SOLN
INTRAMUSCULAR | Status: DC | PRN
  Administered 2022-07-22 (×2): 1 mg via INTRAVENOUS

## 2022-07-22 MED ORDER — MORPHINE SULFATE (PF) 2 MG/ML IV SOLN
2.0000 mg | INTRAVENOUS | Status: DC | PRN
  Administered 2022-07-22 – 2022-07-23 (×5): 2 mg via INTRAVENOUS
  Filled 2022-07-22 (×4): qty 1

## 2022-07-22 MED ORDER — CLOPIDOGREL BISULFATE 75 MG PO TABS: 600.0000 mg | ORAL_TABLET | ORAL | Status: DC

## 2022-07-22 MED ORDER — IOHEXOL 350 MG/ML SOLN
INTRAVENOUS | Status: DC | PRN
  Administered 2022-07-22: 60 mL

## 2022-07-22 MED ORDER — HEPARIN SODIUM (PORCINE) 1000 UNIT/ML IJ SOLN
INTRAMUSCULAR | Status: AC
  Filled 2022-07-22: qty 10

## 2022-07-22 MED ORDER — ALUM & MAG HYDROXIDE-SIMETH 200-200-20 MG/5ML PO SUSP
30.0000 mL | Freq: Four times a day (QID) | ORAL | Status: DC | PRN
  Administered 2022-07-22 (×2): 30 mL via ORAL
  Filled 2022-07-22: qty 30

## 2022-07-22 MED ORDER — FENTANYL CITRATE (PF) 100 MCG/2ML IJ SOLN
INTRAMUSCULAR | Status: DC | PRN
  Administered 2022-07-22 (×2): 25 ug via INTRAVENOUS
  Administered 2022-07-22 (×2): 50 ug via INTRAVENOUS

## 2022-07-22 MED ORDER — CLOPIDOGREL BISULFATE 75 MG PO TABS
75.0000 mg | ORAL_TABLET | Freq: Every day | ORAL | Status: DC
  Administered 2022-07-23 – 2022-07-26 (×4): 75 mg via ORAL
  Filled 2022-07-22 (×4): qty 1

## 2022-07-22 MED ORDER — SODIUM CHLORIDE 0.9 % IV SOLN: 250.0000 mL | INTRAVENOUS | Status: DC | PRN

## 2022-07-22 MED ORDER — HEPARIN SODIUM (PORCINE) 1000 UNIT/ML IJ SOLN
INTRAMUSCULAR | Status: DC | PRN
  Administered 2022-07-22: 6000 [IU] via INTRAVENOUS

## 2022-07-22 MED ORDER — ORAL CARE MOUTH RINSE: 15.0000 mL | OROMUCOSAL | Status: DC | PRN

## 2022-07-22 MED ORDER — SODIUM CHLORIDE 0.9 % IV SOLN: INTRAVENOUS | Status: DC

## 2022-07-22 MED ORDER — ALUM & MAG HYDROXIDE-SIMETH 200-200-20 MG/5ML PO SUSP
ORAL | Status: AC
  Filled 2022-07-22: qty 30

## 2022-07-22 MED ORDER — NITROGLYCERIN 1 MG/10 ML FOR IR/CATH LAB
INTRA_ARTERIAL | Status: AC
  Filled 2022-07-22: qty 10

## 2022-07-22 MED ORDER — MORPHINE SULFATE (PF) 2 MG/ML IV SOLN
INTRAVENOUS | Status: AC
  Filled 2022-07-22: qty 1

## 2022-07-22 MED ORDER — MIDAZOLAM HCL 2 MG/2ML IJ SOLN
INTRAMUSCULAR | Status: AC
  Filled 2022-07-22: qty 2

## 2022-07-22 MED ORDER — SODIUM CHLORIDE 0.9% FLUSH: 3.0000 mL | Freq: Two times a day (BID) | INTRAVENOUS | Status: DC

## 2022-07-22 MED ORDER — CLOPIDOGREL BISULFATE 75 MG PO TABS
600.0000 mg | ORAL_TABLET | Freq: Once | ORAL | Status: AC
  Administered 2022-07-22: 600 mg via ORAL
  Filled 2022-07-22: qty 8

## 2022-07-22 MED ORDER — SODIUM CHLORIDE 0.9% FLUSH
3.0000 mL | Freq: Two times a day (BID) | INTRAVENOUS | Status: DC
  Administered 2022-07-22 – 2022-08-01 (×16): 3 mL via INTRAVENOUS

## 2022-07-22 MED ORDER — HEPARIN (PORCINE) IN NACL 1000-0.9 UT/500ML-% IV SOLN
INTRAVENOUS | Status: DC | PRN
  Administered 2022-07-22 (×2): 500 mL

## 2022-07-22 MED ORDER — ASPIRIN 81 MG PO CHEW
81.0000 mg | CHEWABLE_TABLET | ORAL | Status: AC
  Administered 2022-07-22: 81 mg via ORAL
  Filled 2022-07-22: qty 1

## 2022-07-22 MED ORDER — ONDANSETRON HCL 4 MG/2ML IJ SOLN
4.0000 mg | Freq: Three times a day (TID) | INTRAMUSCULAR | Status: DC | PRN
  Administered 2022-07-22 – 2022-07-26 (×6): 4 mg via INTRAVENOUS
  Filled 2022-07-22 (×5): qty 2

## 2022-07-22 MED ORDER — LIDOCAINE HCL (PF) 1 % IJ SOLN
INTRAMUSCULAR | Status: DC | PRN
  Administered 2022-07-22: 15 mL

## 2022-07-22 MED ORDER — HEPARIN (PORCINE) IN NACL 1000-0.9 UT/500ML-% IV SOLN
INTRAVENOUS | Status: AC
  Filled 2022-07-22: qty 1000

## 2022-07-22 MED ORDER — LIDOCAINE HCL (PF) 1 % IJ SOLN
INTRAMUSCULAR | Status: AC
  Filled 2022-07-22: qty 30

## 2022-07-22 MED ORDER — ONDANSETRON HCL 4 MG/2ML IJ SOLN
INTRAMUSCULAR | Status: AC
  Filled 2022-07-22: qty 2

## 2022-07-22 SURGICAL SUPPLY — 20 items
BALL SAPPHIRE NC24 3.25X15 (BALLOONS) ×1
BALLN SAPPHIRE 2.5X12 (BALLOONS) ×1
BALLOON SAPPHIRE 2.5X12 (BALLOONS) IMPLANT
BALLOON SAPPHIRE NC24 3.25X15 (BALLOONS) IMPLANT
CATH INFINITI JR4 5F (CATHETERS) IMPLANT
CATH LAUNCHER 6FR EBU 4 (CATHETERS) IMPLANT
ELECT DEFIB PAD ADLT CADENCE (PAD) IMPLANT
KIT ENCORE 26 ADVANTAGE (KITS) IMPLANT
KIT HEART LEFT (KITS) ×1 IMPLANT
KIT MICROPUNCTURE NIT STIFF (SHEATH) IMPLANT
PACK CARDIAC CATHETERIZATION (CUSTOM PROCEDURE TRAY) ×1 IMPLANT
SHEATH PINNACLE 6F 10CM (SHEATH) IMPLANT
SHEATH PINNACLE ST 6F 65CM (SHEATH) IMPLANT
SHEATH PROBE COVER 6X72 (BAG) IMPLANT
STENT SYNERGY XD 3.0X20 (Permanent Stent) IMPLANT
SYNERGY XD 3.0X20 (Permanent Stent) ×1 IMPLANT
TRANSDUCER W/STOPCOCK (MISCELLANEOUS) ×1 IMPLANT
TUBING CIL FLEX 10 FLL-RA (TUBING) ×1 IMPLANT
WIRE ASAHI PROWATER 180CM (WIRE) IMPLANT
WIRE EMERALD 3MM-J .035X150CM (WIRE) IMPLANT

## 2022-07-22 NOTE — Interval H&P Note (Signed)
History and Physical Interval NCath Lab Visit (complete for each Cath Lab visit)  Clinical Evaluation Leading to the Procedure:   ACS: Yes.    Non-ACS:    Anginal Classification: CCS III  Anti-ischemic medical therapy: Maximal Therapy (2 or more classes of medications)  Non-Invasive Test Results: No non-invasive testing performed  Prior CABG: No previous CABG      ote:  07/22/2022 11:32 AM  Allen Cortez  has presented today for surgery, with the diagnosis of cad.  The various methods of treatment have been discussed with the patient and family. After consideration of risks, benefits and other options for treatment, the patient has consented to  Procedure(s): CORONARY STENT INTERVENTION (N/A) as a surgical intervention.  The patient's history has been reviewed, patient examined, no change in status, stable for surgery.  I have reviewed the patient's chart and labs.  Questions were answered to the patient's satisfaction.     Collier Salina [facility] 07/22/2022 11:33 AM

## 2022-07-22 NOTE — Progress Notes (Addendum)
[facility] Progress Note   Subjective:   Patient seen and examined at bedside.  Denies CP, SOB, abdominal pain and n/v/d. States he is breathing better.  No improvement in UOP.  Tolerated dialysis yesterday. Cardiology deciding on PCI vs CABG today.  I see pt on his way down to the cath lab for stent -  no c/o's   Objective Vitals:   07/21/22 1616 07/21/22 2000 07/21/22 2136 07/22/22 0519  BP: 120/79 106/72 119/78 132/85  Pulse: 87 96  93  Resp: 16   16  Temp: 98.4 F (36.9 C) 98.3 F (36.8 C)  98.4 F (36.9 C)  TempSrc: Oral Oral  Oral  SpO2: 100% 99%  96%  Weight:      Height:       Physical Exam General:chronically ill appearing male in NAD Heart:RRR, no mrg Lungs:CTAB, nml WOB on 3l O2 via  Abdomen:soft, NTND Extremities:no LE edema Dialysis Access: [facility]   Filed Weights   07/19/22 1852 07/21/22 0745 07/21/22 1215  Weight: 66.6 kg 65.9 kg 64.6 kg    Intake/Output Summary (Last 24 hours) at 07/22/2022 0707 Last data filed at 07/21/2022 1840 Gross per 24 hour  Intake 273.79 ml  Output 1500 ml  Net -1226.21 ml    Additional Objective Labs: Basic Metabolic Panel: Recent Labs  Lab 07/19/22 0454 07/20/22 0948 07/21/22 0144 07/22/22 0541  NA  --  135 135 135  K  --  4.3 3.9 3.7  CL  --  97* 95* 95*  CO2  --  '29 28 28  '$ GLUCOSE  --  104* 114* 94  BUN  --  20 33* 20  CREATININE  --  3.83* 4.94* 3.97*  CALCIUM  --  8.5* 8.4* 8.4*  PHOS 3.2 3.4  --   --    Liver Function Tests: Recent Labs  Lab 07/18/22 1653 07/18/22 2045 07/20/22 0948  AST 18 19  --   ALT 11 11  --   ALKPHOS 68 59  --   BILITOT 0.5 0.7  --   PROT 6.2* 6.3*  --   ALBUMIN 2.7* 2.7* 2.6*   CBC: Recent Labs  Lab 07/18/22 1653 07/18/22 2046 07/19/22 0454 07/20/22 0208 07/21/22 0810 07/22/22 0541  WBC 7.8  --  8.3 8.5 6.5 10.8*  NEUTROABS 5.1  --   --   --   --   --   HGB 6.6*   < > 8.0* 8.0* 7.4* 7.7*  HCT 21.2*   < > 24.4* 24.3* 23.2* 25.2*  MCV 99.1  --   96.1 96.4 100.9* 100.4*  PLT 246  --  204 207 174 213   < > = values in this interval not displayed.   Cardiac Enzymes: Recent Labs  Lab 07/18/22 2045  CKTOTAL 106   CBG: Recent Labs  Lab 07/20/22 1114 07/20/22 1700 07/20/22 2019 07/21/22 0732 07/21/22 1315  GLUCAP 121* 133* 130* 107* 134*    Medications:  sodium chloride     ferric gluconate (FERRLECIT) IVPB Stopped (07/21/22 1731)   heparin 1,050 Units/hr (07/21/22 2304)    sodium chloride   Intravenous Once   aspirin EC  81 mg Oral Daily   atorvastatin  40 mg Oral QHS   carvedilol  6.25 mg Oral BID WC   Chlorhexidine Gluconate Cloth  6 each Topical Q0600   Chlorhexidine Gluconate Cloth  6 each Topical Q0600   citalopram  20 mg Oral QHS   clonazePAM  0.5 mg Oral QHS   [  START ON 07/23/2022] darbepoetin (ARANESP) injection - DIALYSIS  150 mcg Intravenous Q Sat-HD   feeding supplement (NEPRO CARB STEADY)  237 mL Oral TID BM   guaiFENesin  600 mg Oral BID   hydrALAZINE  25 mg Oral BID   insulin aspart  0-6 Units Subcutaneous TID AC & HS   isosorbide dinitrate  20 mg Oral BID   multivitamin  1 tablet Oral QHS   QUEtiapine  300 mg Oral QHS   sodium chloride flush  3 mL Intravenous Q12H    Dialysis Orders: [facility] 4hr TTS EDW presumably 70kg 3/2.25, NO UFP Heparin 2K Units Mircera 50 q2weeks (last given 10/14)   Background: 71 y.o. 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 He is presenting with worsening shortness of breath, orthopnea, poor exercise tolerance;  he has decreased urine output  CXR showed Interval increased prominence of the interstitial markings and increased patchy densities in both lungs. Small bilateral pleural effusions.     Assessment/Plan: Acute hypoxic resp failure - likely related to volume overload. CXR w/CHF and b/l pleural effusions.  Improved, now 5.5kg under dry weight. Will need new dry weight on d/c.  AKI on CKD 3b - baseline creatinine 1.6 prior to recent heart cath.   - HD tomorrow per regular schedule.  - Minimal to no UOP, no signs of renal recovery so far. HD dependent for 6 weeks- this likely means ESRD -Hx severe leg pain with dialysis. Order for Dilaudid 0.5 before HD and UF profile #2. At outpt HD he actually takes Oxycodone prescribed by cardiology for the leg pain per family.  HTN - home regimen low dose hydralazine, coreg and imdur; BP in goal w/Coreg 6.'25mg'$  BID alone now-  would not re add hydralazine. d/c home lasix d/t minimal to no UOP. CAD - sp LHC on 8/22 at [facility] as above and seen by CTS. MRI last Friday and may need CABG with total occlusion in the LAD.  He may not recover renal function and may need to proceed with CABG with the designation of ESRD.  CTS consulted - no plans for surgery at this time, high risk.  His case to be discussed at multidisciplinary conference today for decision of CABG vs PCI. -  going for PCI today  A/C systolic CHF - echo with EF 30-35%.  Meds limited by kidney function.  Volume removal with HD.  Anemia-  Hgb 7.7 s/p 1 unit pRBC. Tsat 19%, getting iron load.  ESA recently dosed as OP but will give ESA here tomorrow. Bones-  CCa and phos in goal.  Not on binders or VDRA.  Nutrition - Currently on heart healthy/carb modified diet.  Add fluid restrictions. Alb 2.7 - start protein supplements.  Jen Mow, PA-C [facility] 07/22/2022,7:07 AM  LOS: 4 days   Patient seen and examined, agree with above note with above modifications. Is going for heart cath and stent today -  plan for routine HD tomorrow-  add back esa for hgb in the 7's-  volume status seems good  Corliss Parish, MD 07/22/2022

## 2022-07-22 NOTE — TOC Progression Note (Signed)
Transition of Care [facility]) - Progression Note    Patient Details  Name: Allen Cortez MRN: [id number] Date of Birth: 1950/10/22  [facility] CM/SW Contact  Graves-Bigelow, Ocie Cornfield, RN Phone Number: 07/22/2022, 10:49 AM  Clinical Narrative:  Patient plan for cath today at 1500. Spouse at bedside and we discussed DME needs. Patient has used [employer] in the past and wants to use them again. DME Rollator ordered via [employer] by [employer] and it will be delivered to the room prior to transition home. Patient is currently active with [facility] for RN Services- PT added. Patient will need resumption orders once stable. Alvis Lemmings is aware and following the patient. No further needs identified at this time.   Expected Discharge Plan: [place] Barriers to Discharge: No Barriers Identified  Expected Discharge Plan and Services Expected Discharge Plan: [place] In-house Referral: NA Discharge Planning Services: CM Consult Post Acute Care Choice: Home Health, Resumption of Svcs/PTA Provider Living arrangements for the past 2 months: Single Family Home                 DME Arranged: Walker rolling with seat DME Agency: [employer] Date DME Agency Contacted: 07/22/22 Time DME Agency Contacted: [phone] Representative spoke with at DME Agency: [employer] HH Arranged: PT, RN [place] Agency: [place] Date [place]: 07/19/22 Time [place]: [phone] Representative spoke with at [place]: [place]  Readmission Risk Interventions    07/19/2022   12:30 PM 06/24/2022    3:23 PM  Readmission Risk Prevention Plan  Transportation Screening Complete Complete  PCP or Specialist Appt within 3-5 Days  Complete  HRI or [place]  Complete  Social Work Consult for [place] Planning/Counseling  Complete  Palliative Care Screening  Not Applicable  Medication Review Press photographer) Complete Complete  HRI or [place] Complete   SW Recovery Care/Counseling Consult Complete   [place] Not Applicable

## 2022-07-22 NOTE — Assessment & Plan Note (Addendum)
Cardiac catheterization with 2 vessel disease, LAD 80%, Lcx.   Continue medical therapy with aspirin/ clopidogrel and B blockade. Statin therapy.  Cardiac MRI with mid anterior/ anteroseptal, apical, septal, inferior, lateral walls and apex with LGE consistent with prior infarct. LGE greater than 50%, suggesting nonviability in apical anterior, septal, inferior and lateral walls and apex.  Nonviability in mid to distal LAD territory   Catheterization Prox LAD to Mid LAD 100% stenosed  Distal Cx lesion 80% stenosed  Patient had a successful drug eluding stent placed, to the mid LCx, Recommendations to continue warfarin, aspirin for 1 month and clopidogrel for 6 months.   Patient needs to continue with dual antiplatelet therapy despite upper GI bleed due to high risk of stent thrombosis.

## 2022-07-22 NOTE — Progress Notes (Signed)
Pt received from cath lab. (R) groin dressing C/D/I level 1 with a silver dollar size bruise. No c/o of pain or nausea at this time. Spouse bedside. Sequence vitals started. Call bell placed within reach. Will continue to monitor and maintain safety.

## 2022-07-22 NOTE — Progress Notes (Signed)
Patient had episode of hematemesis post PCI, patient had been on aspirin and was loaded with clopidogrel.  Hemodynamically sable.  Plan to transfuse one unit PRBC Close follow up on Hgb  Heparin with no bolus.   Case discussed with cardiology.

## 2022-07-22 NOTE — Progress Notes (Signed)
ANTICOAGULATION CONSULT NOTE  Pharmacy Consult for heparin  Indication: LV thrombus  Allergies  Allergen Reactions   Dilaudid [Hydromorphone Hcl] Other (See Comments)    hallucinations   Mirapex [Pramipexole Dihydrochloride] Other (See Comments)    Leg pain   Prednisone Other (See Comments)    irritability   Gabapentin     Other reaction(s): Tremor    Patient Measurements: Height: '5\' 10"'$  (177.8 cm) Weight: 64.6 kg (142 lb 6.7 oz) IBW/kg (Calculated) : 73  Vital Signs: Temp: 98.4 F (36.9 C) (10/20 0519) Temp Source: Oral (10/20 0519) BP: 132/85 (10/20 0519) Pulse Rate: 93 (10/20 0519)  Labs: Recent Labs    07/20/22 0208 07/20/22 0948 07/21/22 0144 07/21/22 0737 07/21/22 0810 07/21/22 2234 07/22/22 0541  HGB 8.0*  --   --   --  7.4*  --  7.7*  HCT 24.3*  --   --   --  23.2*  --  25.2*  PLT 207  --   --   --  174  --  213  LABPROT 24.4*  --   --  19.7*  --   --  16.7*  INR 2.2*  --   --  1.7*  --   --  1.4*  HEPARINUNFRC  --   --   --   --   --  <0.10* 0.15*  CREATININE  --  3.83* 4.94*  --   --   --  3.97*     Estimated Creatinine Clearance: 15.4 mL/min (A) (by C-G formula based on SCr of 3.97 mg/dL (H)).   Medical History: Past Medical History:  Diagnosis Date   AAA (abdominal aortic aneurysm) (HCC)    Abdominal wall mass of left lower quadrant    Basal cell carcinoma    CAD (coronary artery disease) 2011   moderate   Colon polyp    DDD (degenerative disc disease)    Depression    Diabetes mellitus type II    no meds   GERD (gastroesophageal reflux disease)    Hyperlipidemia    Neuromuscular disorder (HCC)    Osteopenia    PAD (peripheral artery disease) (HCC)    Renal cell carcinoma    Restless leg syndrome    Sleep apnea    used a cpap yr ago-does not snore or use it now.    Medications:  Medications Prior to Admission  Medication Sig Dispense Refill Last Dose   acetaminophen (TYLENOL) 325 MG tablet Take 2 tablets (650 mg total) by mouth  every 4 (four) hours as needed for headache or mild pain.   Past Month   aspirin EC 81 MG tablet Take 1 tablet (81 mg total) by mouth daily. Swallow whole. 30 tablet 3 07/18/2022   atorvastatin (LIPITOR) 40 MG tablet Take 40 mg by mouth at bedtime.   07/17/2022   B Complex-C-Zn-Folic Acid (DIALYVITE 240-XBDZ 15) 0.8 MG TABS Take 1 tablet by mouth 3 (three) times daily as needed (with meals).   Past Week   carvedilol (COREG) 6.25 MG tablet Take 1 tablet (6.25 mg total) by mouth 2 (two) times daily with a meal. 180 tablet 3 07/18/2022 at 0900   citalopram (CELEXA) 40 MG tablet Take 40 mg by mouth every evening.   07/17/2022   clonazePAM (KLONOPIN) 0.5 MG tablet Take 0.5 mg by mouth at bedtime.   07/17/2022   furosemide (LASIX) 40 MG tablet Take 1 tablet (40 mg total) by mouth 2 (two) times daily. 180 tablet 3 07/18/2022  hydrALAZINE (APRESOLINE) 25 MG tablet Take 1 tablet (25 mg total) by mouth every 8 (eight) hours. 270 tablet 3 07/18/2022   isosorbide mononitrate (IMDUR) 30 MG 24 hr tablet Take 0.5 tablets (15 mg total) by mouth daily. 45 tablet 3 07/18/2022   lansoprazole (PREVACID) 15 MG capsule Take 15 mg by mouth every morning.   07/18/2022   multivitamin (RENA-VIT) TABS tablet Take 1 tablet by mouth at bedtime. 30 tablet 0 07/17/2022   Nutritional Supplements (NEPRO) LIQD Take 237 mLs by mouth 2 (two) times daily.   07/18/2022   QUEtiapine (SEROQUEL) 300 MG tablet Take 300 mg by mouth at bedtime.   07/17/2022   warfarin (COUMADIN) 5 MG tablet Take 1 tablet daily Monday, Wednesday, Friday, Saturday and Sunday and take half tablet daily on Tuesday and Thursday. (Patient taking differently: Take 2.5-5 mg by mouth See admin instructions. Take 2.5 mg (1/2 tablet) by mouth daily every day of the week except for Monday's. On Monday's take 5 mg (1 tablet).) 30 tablet 0 07/17/2022 at 2000   feeding supplement (ENSURE ENLIVE / ENSURE PLUS) LIQD Take 237 mLs by mouth 2 (two) times daily between meals.  (Patient not taking: Reported on 07/18/2022) 14220 mL 0 Not Taking    Assessment: 71 yo male on warfarin PTA for recent LV thrombus. He is also noted with ESRD and s/p emergent HD.  Plans are possible PCI vs CABG -heparin level= 0.15 on 1050 units/hr, hg= 7.7    Goal of Therapy:  Heparin level 0.3-0.7 units/ml Monitor platelets by anticoagulation protocol: Yes   Plan:  -Increase heparin to 1250 units/hr -Will follow plans for cath   Hildred Laser, PharmD Clinical Pharmacist **Pharmacist phone directory can now be found on [email or web address] ([email or web address]).  Listed under [place].

## 2022-07-22 NOTE — Progress Notes (Signed)
Progress Note   Patient: Allen Cortez JFH:[id number] DOB: July 01, 1951 DOA: 07/18/2022     4 DOS: the patient was seen and examined on 07/22/2022   Brief hospital course: Mr. Pett was admitted to the hospital with volume overload in the setting of ESRD.   71 yo male with the past medical history of CAD, systolic heart failure, LV thrombus, right renal cancer sp partial left nephrectomy, T2DM, ESRD and chronic hypoxemic respiratory failure who presented with dyspnea. At home he had progressive dyspnea and orthopnea despite renal replacement therapy with ultrafiltration on T-TH-Sat schedule. On his initial physical examination his blood pressure was 148/94, HT 94, lungs with no wheezing, heart with S1 and S2 present and rhythmic, abdomen with no distention, no lower extremity edema.   Na 137, K 4,1 CL 97 bicarbonate 28 glucose 130 bun 51 cr 6,0  BNP 2,398 High sensitive troponin 266, 271  Wbc 7,8 hgb 6,6 plt [place] 19 negative   Chest radiograph with cardiomegaly, bilateral interstitial infiltrates, symmetric, bilateral small pleural effusions, right IJ vein HD catheter in place.   EKG 85 bpm, left axis deviation, normal intervals, sinus rhythm with poor R to R wave progression, with no significant ST segment or T wave changes, positive LVH.   10/17 Patient had urgent HD with ultrafiltration  10/19 HD. Plan for multidisciplinary cardiovascular meeting tomorrow to decide if patient will benefit more from PCI than CABG.  10/20 PCI   Assessment and Plan: Acute kidney injury (AKI) with acute tubular necrosis (ATN) ([place]) ESRD on HD.  Patient with improving volume status  Plan to continue ultrafiltration and hemodialysis per nephrology recommendations.  Acute on chronic hypoxemic respiratory failure due to acute pulmonary edema Oxygenation has improved 02 saturation today 100% on 3 L/min per [place].   Anemia of chronic renal disease with iron deficiency  Continue with EPO and  iron.   Acute on chronic systolic congestive heart failure (HCC)  Echocardiogram with reduced LV systolic function EF 30 to 35%, akinesis of the LV apical segment wall, inferior wall, anterior wall and lateral wall. Akinesis of the entire apical segment. Severe hypokinesis of the left ventricle, basal mid anterior wall. Preserved RV systolic function. RVSP 45.5 mmHg.   Continue with carvedilol After load reduction with hydralazine and isosorbide.    Continue heparin for LV thrombus, transition to warfarin after completing cardiac workup.   Acute on chronic systolic CHF (congestive heart failure) (HCC)-resolved as of 07/21/2022  Echocardiogram with reduced LV systolic function EF 30 to 35%, akinesis of the LV apical segment wall, inferior wall, anterior wall and lateral wall. Akinesis of the entire apical segment. Severe hypokinesis of the left ventricle, basal mid anterior wall. Preserved RV systolic function. RVSP 45.5 mmHg.  Systolic blood pressure  937 to 130 mmHg.   Continue close blood pressure monitoring.  Continue with carvedilol After load reduction with hydralazine and isosorbide.   Continue heparin for LV thrombus, transition to warfarin after completing cardiac workup.   Coronary artery disease involving native coronary artery of native heart with angina pectoris ([place]): 100% mLAD, 80% m(AVG)LCx Cardiac catheterization with 2 vessel disease, LAD 80%, Lcx.    Continue medical therapy with aspirin and B blockade. Statin therapy.  Cardiac MRI with mid anterior/ anteroseptal, apical, septal, inferior, lateral walls and apex with LGE consistent with prior infarct. LGE greater than 50%, suggesting nonviability in apical anterior, septal, inferior and lateral walls and apex.  Nonviability in mid to distal LAD territory  Continue medical therapy with aspirin and statin.   Plan for PCI today.   Coronary atherosclerosis of native coronary artery-resolved as of 07/21/2022 Cardiac  catheterization with 2 vessel disease, LAD 80%, Lcx.   Continue medical therapy with aspirin and B blockade. Statin therapy.  Cardiac MRI with mid anterior/ anteroseptal, apical, septal, inferior, lateral walls and apex with LGE consistent with prior infarct. LGE greater than 50%, suggesting nonviability in apical anterior, septal, inferior and lateral walls and apex.  Nonviability in mid to distal LAD territory   Patient likely not candidate for surgery revascularization CABG. Follow up with cardiology recommendations.  Continue medical therapy with aspirin and statin.   Type 2 diabetes mellitus with hyperlipidemia (HCC) Continue insulin sliding scale for glucose cover and monitoring Patient is tolerating po well.   GERD (gastroesophageal reflux disease) Continue with antiacid therapy.   PAD (peripheral artery disease) ([place]) Continue with statin therapy.         Subjective: Patient with leg pain, no dyspnea or chest pain  Physical Exam: Vitals:   07/22/22 1505 07/22/22 1520 07/22/22 1535 07/22/22 1607  BP: (!) 152/99 (!) 156/100 (!) 145/97 (!) 143/100  Pulse: 91 91 93 92  Resp: 15 (!) 22 (!) 22 (!) 22  Temp:    97.6 F (36.4 C)  TempSrc:    Oral  SpO2: 96% 98% 97% 95%  Weight:      Height:        Neurology awake and alert ENT with mild pallor Cardiovascular with S1 and S2 present and rhythmic with no gallops Respiratory with no rales or wheezing Abdomen with no distention No lower extremity edema  Data Reviewed:    Family Communication: I spoke with patient's wife at the bedside, we talked in detail about patient's condition, plan of care and prognosis and all questions were addressed.   Disposition: Status is: Inpatient Remains inpatient appropriate because: PCI  Planned Discharge Destination: Home      Author: Tawni Millers, MD 07/22/2022 4:43 PM  For on call review [email or web address].

## 2022-07-22 NOTE — Assessment & Plan Note (Addendum)
Echocardiogram with reduced LV systolic function EF 30 to 35%, akinesis of the LV apical segment wall, inferior wall, anterior wall and lateral wall. Akinesis of the entire apical segment. Severe hypokinesis of the left ventricle, basal mid anterior wall. Preserved RV systolic function. RVSP 45.5 mmHg.  Continue with carvedilol After load reduction with hydralazine and isosorbide.   LV thrombus, heparin has been stopped, INR is 3.5   Continue anticoagulation with warfarin.

## 2022-07-22 NOTE — Progress Notes (Signed)
    Called to holding area as patient had episode of large coffee ground emesis after PCI of the LCx with plavix load. He has been on IV heparin (coumadin held-LV thrombus) along with ASA. Hgb 7.7 this morning prior to procedure (chronic anemia in the setting of CKD).   On exam he is alert and oriented, does have some mild abd pain, no longer nauseated. BP 157/94, HR 91 with sat 96%. No chest pain.   -- check stat CBC -- IV protonix '40mg'$  daily -- will transfuse 1 unit PRBCs, type and screen   Updated attending regarding patient status. Will touch base with primary team as well.   SignedReino Bellis, NP-C 07/22/2022, 2:57 PM Pager: [phone]

## 2022-07-22 NOTE — Progress Notes (Signed)
OT Cancellation Note  Patient Details Name: Allen Cortez MRN: [id number] DOB: 10-22-1950   Cancelled Treatment:    Reason Eval/Treat Not Completed: Patient at procedure or test/ unavailable Patient off floor for stent placement. OT will follow back as time permits.  Corinne Ports E. Jamil Castillo, OTR/L Acute Rehabilitation Services [phone]   [name] 07/22/2022, 12:47 PM

## 2022-07-22 NOTE — Progress Notes (Cosign Needed)
Rounding Note    Patient Name: Allen Cortez Date of Encounter: 07/22/2022  [place] Cardiologist: Shelva Majestic, MD   Subjective   No chest pain, breathing is stable. Planned for cardiac cath today.   Patient's wife was unsure of what the plan was so I went to discuss it with her prior to the patient going down for his cath.  He did have some abdominal pain and leg pain in the more prior to his catheter was otherwise.  She had a red on my chart the previous days rounding note indicating that we were discussing his case at Heart Team meeting earlier this morning.  What was not portrayed in the note was that I talk to him in person about the high likelihood be related to proceed with PCI to the LCx today.  He is also concerned about note indicating that he had missed dialysis today because of not showing up.  I reassured her that this is not what [place].  I indicated that he had missed getting full dialysis because of symptoms.)  Inpatient Medications    Scheduled Meds:  sodium chloride   Intravenous Once   aspirin  81 mg Oral Pre-Cath   aspirin EC  81 mg Oral Daily   atorvastatin  40 mg Oral QHS   carvedilol  6.25 mg Oral BID WC   Chlorhexidine Gluconate Cloth  6 each Topical Q0600   Chlorhexidine Gluconate Cloth  6 each Topical Q0600   Chlorhexidine Gluconate Cloth  6 each Topical Q0600   citalopram  20 mg Oral QHS   clonazePAM  0.5 mg Oral QHS   clopidogrel  600 mg Oral Pre-Cath   [START ON 07/23/2022] darbepoetin (ARANESP) injection - DIALYSIS  150 mcg Intravenous Q Sat-HD   feeding supplement (NEPRO CARB STEADY)  237 mL Oral TID BM   guaiFENesin  600 mg Oral BID   hydrALAZINE  25 mg Oral BID   insulin aspart  0-6 Units Subcutaneous TID AC & HS   isosorbide dinitrate  20 mg Oral BID   multivitamin  1 tablet Oral QHS   QUEtiapine  300 mg Oral QHS   sodium chloride flush  3 mL Intravenous Q12H   Continuous Infusions:  sodium chloride     sodium chloride      ferric gluconate (FERRLECIT) IVPB Stopped (07/21/22 1731)   heparin 1,250 Units/hr (07/22/22 0711)   PRN Meds: sodium chloride, acetaminophen **OR** acetaminophen, albuterol, bisacodyl, mouth rinse, sodium chloride flush   Vital Signs    Vitals:   07/21/22 1616 07/21/22 2000 07/21/22 2136 07/22/22 0519  BP: 120/79 106/72 119/78 132/85  Pulse: 87 96  93  Resp: 16   16  Temp: 98.4 F (36.9 C) 98.3 F (36.8 C)  98.4 F (36.9 C)  TempSrc: Oral Oral  Oral  SpO2: 100% 99%  96%  Weight:      Height:        Intake/Output Summary (Last 24 hours) at 07/22/2022 0836 Last data filed at 07/21/2022 1840 Gross per 24 hour  Intake 273.79 ml  Output 1500 ml  Net -1226.21 ml      07/21/2022   12:15 PM 07/21/2022    7:45 AM 07/19/2022    6:52 PM  Last 3 Weights  Weight (lbs) 142 lb 6.7 oz 145 lb 4.5 oz 146 lb 13.2 oz  Weight (kg) 64.6 kg 65.9 kg 66.6 kg      Telemetry    Sinus Rhythm - Personally Reviewed  ECG    No new tracing this morning  Physical Exam   GEN: Thin, older male, No acute distress.  Wearing [place] Neck: No JVD Cardiac: RRR, no murmurs, rubs, or gallops.  Respiratory: Clear to auscultation bilaterally. GI: Soft, non-distended -> mild tenderness to exam on my exam, but with that he had to go to the bathroom.   MS: No edema; No deformity. Neuro:  Nonfocal  Psych: Normal affect   Labs    High Sensitivity Troponin:   Recent Labs  Lab 07/18/22 1653 07/18/22 1845 07/18/22 2045 07/19/22 0454  TROPONINIHS 266* 271* 256* 288*     Chemistry Recent Labs  Lab 07/18/22 1653 07/18/22 2045 07/18/22 2046 07/20/22 0948 07/21/22 0144 07/22/22 0541  NA 137  --    < > 135 135 135  K 4.1  --    < > 4.3 3.9 3.7  CL 97*  --   --  97* 95* 95*  CO2 28  --   --  '29 28 28  '$ GLUCOSE 130*  --   --  104* 114* 94  BUN 51*  --   --  20 33* 20  CREATININE 6.05*  --   --  3.83* 4.94* 3.97*  CALCIUM 8.6*  --   --  8.5* 8.4* 8.4*  PROT 6.2* 6.3*  --   --   --   --    ALBUMIN 2.7* 2.7*  --  2.6*  --   --   AST 18 19  --   --   --   --   ALT 11 11  --   --   --   --   ALKPHOS 68 59  --   --   --   --   BILITOT 0.5 0.7  --   --   --   --   GFRNONAA 9*  --   --  16* 12* 15*  ANIONGAP 12  --   --  '9 12 12   '$ < > = values in this interval not displayed.    Lipids No results for input(s): "CHOL", "TRIG", "HDL", "LABVLDL", "LDLCALC", "CHOLHDL" in the last 168 hours.  Hematology Recent Labs  Lab 07/20/22 0208 07/21/22 0810 07/22/22 0541  WBC 8.5 6.5 10.8*  RBC 2.52* 2.30* 2.51*  HGB 8.0* 7.4* 7.7*  HCT 24.3* 23.2* 25.2*  MCV 96.4 100.9* 100.4*  MCH 31.7 32.2 30.7  MCHC 32.9 31.9 30.6  RDW 16.5* 17.6* 17.2*  PLT 207 174 213   Thyroid  Recent Labs  Lab 07/18/22 2040  TSH 1.676    BNP Recent Labs  Lab 07/18/22 1653  BNP 2,398.5*    DDimer No results for input(s): "DDIMER" in the last 168 hours.   Radiology    No results found.  Cardiac Studies   ECHO 06/12/2022:  1. 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 Left ventricular ejection fraction, by estimation, is 30-35%. The left ventricle is abnormal. The left ventricle has focal regional wall motion abnormalities. The left ventricular internal cavity size was mildly dilated. There is akinesis of  the left ventricular, apical septal wall, inferior wall, anterior wall and lateral wall. There is akinesis of the left ventricular, entire apical segment. There is severe hypokinesis of the left ventricular, basal-mid anterior wall. A falst tendon is present in the mid LV cavity.   2. Right ventricular systolic function is  normal. The right ventricular size is normal. There is moderately elevated pulmonary artery systolic pressure. The estimated right ventricular systolic pressure is 59.5 mmHg.   3. The mitral valve is normal in structure. Mild mitral valve regurgitation. No evidence of mitral  stenosis.   4. The aortic valve is tricuspid. There is moderate calcification of the aortic valve. Aortic valve regurgitation is not visualized. Aortic valve sclerosis/calcification is present, without any evidence of aortic stenosis.   5. The inferior vena cava is dilated in size with >50% respiratory variability, suggesting right atrial pressure of 8 mmHg.      CMR 07/15/2022: 1. Subendocardial LGE consistent with prior infarct in mid anterior/anteroseptal, apical anterior/septal/inferior/lateral walls, and apex. LGE is greater than 50% transmural suggesting nonviability in apical anterior/septal/inferior/lateral walls and apex. LGE <50% transmural in mid anterior wall but wall thickness <4.109m suggests nonviability. Findings are consistent with nonviability in mid to distal LAD territory 2. Severe LV dilatation with severe systolic dysfunction (EF 263%. Thinning/akinesis of mid to apical anterior/anteroseptal walls and apical inferior/lateral walls. Aneurysm of apex. 3.  LV apical thrombus measures 155mx 73m60m.  Normal RV size and systolic function (EF 48%87%. Diffuse pulmonary opacities and bilateral pleural effusions (moderate on right, small on left), consider CT chest for further evaluation.  Patient Profile     70 9o. male with a PMHx of nonobstructive CAD 2011, AAA s/p repair 2013, HTN, HLD, T2DM, RCC s/p partial left nephrectomy 2015, OSA, GERD, RLS. who is being seen 07/18/2022 for the evaluation of acute on chronic HFrEF, NSTEMI and known CAD at the request of the primary hospitalist service.  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 on HD -- Initial presentation was been significant volume overload in the setting of HFrEF and ESRD, and inability to fully dialyze (not missing dates, simply missing complete dialysis). => No longer orthopneic.  Breathing much better. -- little UOP noted -- Nephrology on board.  Much improved   HFrEF  ICM  --  Echo 06/12/22 LVEF 30-35% with clear evidence of LV thrombus. Akinesis of the anterior wall. No significant valve disease -- volume management per HD  -- on coreg 6.'25mg'$  BID, hydralazine '50mg'$  TID, isordil '20mg'$  BID -- currently no ACE/ARB/ARNi, and no SGLT2 inhibitor with renal disease.   CAD w/ Angina & Elevated troponin /Demand Ischemia -type II MI/supply versus demand ischemia from combined CHF, known CAD and ESRD) -- [facility] 2[facility] with CTO of the LAD and 80% Lcx. Outpatient cardiac MRI 10/13 without viable myocardium. Per Dr. LigKipp Broodot a candidate for CABG. Case was reviewed this morning in [facility] plans for PCI of the LCx.  -- continue ASA, statin, BB, Isordil and hydralazine. Will load with '600mg'$  plavix in anticipation for PCI   LV thrombus -- diagnosed at the [facility] Cardiac MRI + thrombus -- coumadin held, INR 1.4 -- continue IV heparin   HTN  -- continue coreg 6.'25mg'$  BID, hydralazine '50mg'$  TID, Isordil 20 mg   HLD  -- recent LDL 60 -- Continue with atorvastatin 40 mg daily.   Bilateral LE pain -- Significant bilateral leg pain which is worse with exertion but also at rest when not wearing nasal cannula oxygen. Recent LE dopplers (07/11/2022) normal. Low threshold to consider ABIs  Anemia -- Hgb 7.7 this morning, s/p 1 unit PRBCs with HD -- Tsat 10, receiving IV iron  -- ordered ESA tomorrow  For questions or updates, please contact [place] Please consult [email or web address] for contact info under        Signed, Reino Bellis, NP  07/22/2022, 8:36 AM     ATTENDING ATTESTATION  I have seen, examined and evaluated the patient in the morning on rounds prior to [facility] after initial evaluation by Mancel Bale, NP.  After reviewing all the available data and chart, we discussed the patients laboratory, study & physical findings as well as symptoms in detail.  I agree with her findings, examination as well as impression  recommendations as per our discussion.    Attending adjustments noted in italics. ->  I also saw the patient shortly after his PCI, great results on the LCx.  He is very happy.   ADDENDUM: Several hours post PCI patient had an episode of hematemesis with coffee-ground vomitus. => See progress notes by APP for update. -> Recommended not restarting heparin for now, and we could potentially consider stopping aspirin on discharge.    Leonie Man, MD, MS Glenetta Hew, M.D., M.S. Interventional Cardiologist  [place]  Pager # [phone] Phone # [phone] [address] [place], [place] [address]

## 2022-07-22 NOTE — Progress Notes (Signed)
Site area: rt groin Site Prior to Removal:  Level 1 hematoma lateral to sheath insertion site Pressure Applied For: 30 min Manual:   yes Patient Status During Pull:  patient became nauseated 5 minutes into hold and threw up large amount of liquid dark brown/deep maroon emesis; Dr. Martinique made aware; Ria Comment, PA at bedside Post Pull Site:  Level 1 bruise, no hematoma Post Pull Instructions Given:  yes Post Pull Pulses Present: rt PT palpable Dressing Applied:  gauze and tegaderm Bedrest begins @ 1500 Comments:

## 2022-07-22 NOTE — Progress Notes (Signed)
Physical Therapy Treatment Patient Details Name: Allen Cortez MRN: [id number] DOB: Aug 31, 1951 Today's Date: 07/22/2022   History of Present Illness Pt is 71 yo male admitted 07/18/22 with SOB and LE edema. Workup for significant volume overload secondary to CHF, ESRD. S/p urgent HD with ultrafiltration 10/16. PMH includes ESRD (HD TTS), CHF, CAD, AAA repair, HTN, HLD, DM2, OSA, GERD, renal cell carcinoma with partial L nephrectomy, CKD3, abdominal wall mass, depression.    PT Comments    Pt continues to have amb limited by his chronic bilateral thigh cramping that began when HD was initiated 6 weeks ago. Believe he can benefit from rollator at home.    Recommendations for follow up therapy are one component of a multi-disciplinary discharge planning process, led by the attending physician.  Recommendations may be updated based on patient status, additional functional criteria and insurance authorization.  Follow Up Recommendations  Home health PT     Assistance Recommended at Discharge PRN  Patient can return home with the following Help with stairs or ramp for entrance;Assistance with cooking/housework   Equipment Recommendations  Rollator (4 wheels)    Recommendations for Other Services       Precautions / Restrictions Precautions Precautions: Fall;Other (comment) Precaution Comments: BLEs cramp requiring quick need to sit; wears 3L O2 baseline     Mobility  Bed Mobility Overal bed mobility: Independent                  Transfers Overall transfer level: Modified independent Equipment used: Rollator (4 wheels) Transfers: Sit to/from Stand Sit to Stand: Modified independent (Device/Increase time)           General transfer comment: Verbal cues to lock brakes prior to sitting    Ambulation/Gait Ambulation/Gait assistance: Supervision Gait Distance (Feet): 50 Feet (x 2) Assistive device: Rollator (4 wheels) Gait Pattern/deviations: Step-through  pattern, Decreased stride length, Trunk flexed Gait velocity: decreased Gait velocity interpretation: 1.31 - 2.62 ft/sec, indicative of limited community ambulator   General Gait Details: supervision for safety and then needs to sit quickly due to bilateral thigh cramping   Stairs             Wheelchair Mobility    Modified Rankin (Stroke Patients Only)       Balance Overall balance assessment: No apparent balance deficits (not formally assessed)                                          Cognition Arousal/Alertness: Awake/alert Behavior During Therapy: WFL for tasks assessed/performed Overall Cognitive Status: Within Functional Limits for tasks assessed                                          Exercises      General Comments General comments (skin integrity, edema, etc.): Pt on 3L O2 (baseline)      Pertinent Vitals/Pain Pain Assessment Pain Assessment: Faces Faces Pain Scale: Hurts even more Pain Location: bilateral thighs Pain Descriptors / Indicators: Grimacing, Cramping Pain Intervention(s): Limited activity within patient's tolerance, Repositioned    Home Living                          Prior Function  PT Goals (current goals can now be found in the care plan section) Acute Rehab PT Goals Patient Stated Goal: return home Progress towards PT goals: Progressing toward goals    Frequency    Min 3X/week      PT Plan Current plan remains appropriate    Co-evaluation              AM-PAC PT "6 Clicks" Mobility   Outcome Measure  Help needed turning from your back to your side while in a flat bed without using bedrails?: None Help needed moving from lying on your back to sitting on the side of a flat bed without using bedrails?: None Help needed moving to and from a bed to a chair (including a wheelchair)?: A Little Help needed standing up from a chair using your arms (e.g.,  wheelchair or bedside chair)?: None Help needed to walk in hospital room?: A Little Help needed climbing 3-5 steps with a railing? : A Little 6 Click Score: 21    End of Session Equipment Utilized During Treatment: Oxygen Activity Tolerance: Patient tolerated treatment well;Patient limited by pain Patient left: in bed;with call bell/phone within reach   PT Visit Diagnosis: Pain;Difficulty in walking, not elsewhere classified (R26.2) Pain - part of body: Leg     Time: 5400-8676 PT Time Calculation (min) (ACUTE ONLY): 13 min  Charges:  $Gait Training: 8-22 mins                     [place] [facility] 07/22/2022, 9:34 AM

## 2022-07-23 DIAGNOSIS — K922 Gastrointestinal hemorrhage, unspecified: Secondary | ICD-10-CM

## 2022-07-23 DIAGNOSIS — I513 Intracardiac thrombosis, not elsewhere classified: Secondary | ICD-10-CM | POA: Diagnosis not present

## 2022-07-23 DIAGNOSIS — N170 Acute kidney failure with tubular necrosis: Secondary | ICD-10-CM | POA: Diagnosis not present

## 2022-07-23 DIAGNOSIS — I25119 Atherosclerotic heart disease of native coronary artery with unspecified angina pectoris: Secondary | ICD-10-CM | POA: Diagnosis not present

## 2022-07-23 DIAGNOSIS — I255 Ischemic cardiomyopathy: Secondary | ICD-10-CM | POA: Diagnosis not present

## 2022-07-23 DIAGNOSIS — Z7901 Long term (current) use of anticoagulants: Secondary | ICD-10-CM | POA: Diagnosis not present

## 2022-07-23 DIAGNOSIS — E1169 Type 2 diabetes mellitus with other specified complication: Secondary | ICD-10-CM | POA: Diagnosis not present

## 2022-07-23 DIAGNOSIS — I5023 Acute on chronic systolic (congestive) heart failure: Secondary | ICD-10-CM | POA: Diagnosis not present

## 2022-07-23 LAB — CBC
HCT: 26.5 % — ABNORMAL LOW (ref 39.0–52.0)
Hemoglobin: 8.8 g/dL — ABNORMAL LOW (ref 13.0–17.0)
MCH: 32 pg (ref 26.0–34.0)
MCHC: 33.2 g/dL (ref 30.0–36.0)
MCV: 96.4 fL (ref 80.0–100.0)
Platelets: 173 10*3/uL (ref 150–400)
RBC: 2.75 MIL/uL — ABNORMAL LOW (ref 4.22–5.81)
RDW: 17.6 % — ABNORMAL HIGH (ref 11.5–15.5)
WBC: 11.3 10*3/uL — ABNORMAL HIGH (ref 4.0–10.5)
nRBC: 0 % (ref 0.0–0.2)

## 2022-07-23 LAB — BASIC METABOLIC PANEL
Anion gap: 19 — ABNORMAL HIGH (ref 5–15)
BUN: 40 mg/dL — ABNORMAL HIGH (ref 8–23)
CO2: 27 mmol/L (ref 22–32)
Calcium: 8.7 mg/dL — ABNORMAL LOW (ref 8.9–10.3)
Chloride: 91 mmol/L — ABNORMAL LOW (ref 98–111)
Creatinine, Ser: 5.53 mg/dL — ABNORMAL HIGH (ref 0.61–1.24)
GFR, Estimated: 10 mL/min — ABNORMAL LOW (ref >60)
Glucose, Bld: 125 mg/dL — ABNORMAL HIGH (ref 70–99)
Potassium: 4.7 mmol/L (ref 3.5–5.1)
Sodium: 137 mmol/L (ref 135–145)

## 2022-07-23 LAB — GLUCOSE, CAPILLARY
Glucose-Capillary: 123 mg/dL — ABNORMAL HIGH (ref 70–99)
Glucose-Capillary: 134 mg/dL — ABNORMAL HIGH (ref 70–99)

## 2022-07-23 LAB — PROTIME-INR
INR: 1.3 — ABNORMAL HIGH (ref 0.8–1.2)
Prothrombin Time: 16.3 seconds — ABNORMAL HIGH (ref 11.4–15.2)

## 2022-07-23 MED ORDER — WARFARIN SODIUM 5 MG PO TABS: 5.0000 mg | ORAL_TABLET | Freq: Once | ORAL | Status: DC

## 2022-07-23 MED ORDER — WARFARIN - PHARMACIST DOSING INPATIENT: Freq: Every day | Status: DC

## 2022-07-23 MED ORDER — SODIUM CHLORIDE 0.9 % IV SOLN
12.5000 mg | Freq: Four times a day (QID) | INTRAVENOUS | Status: DC | PRN
  Administered 2022-07-23: 12.5 mg via INTRAVENOUS
  Filled 2022-07-23 (×3): qty 0.5

## 2022-07-23 MED ORDER — WARFARIN SODIUM 5 MG PO TABS
5.0000 mg | ORAL_TABLET | Freq: Once | ORAL | Status: AC
  Administered 2022-07-23: 5 mg via ORAL
  Filled 2022-07-23: qty 1

## 2022-07-23 MED ORDER — HEPARIN SODIUM (PORCINE) 1000 UNIT/ML IJ SOLN
INTRAMUSCULAR | Status: AC
  Administered 2022-07-23: 1000 [IU]
  Filled 2022-07-23: qty 4

## 2022-07-23 MED ORDER — HYDROMORPHONE HCL 1 MG/ML IJ SOLN
0.5000 mg | INTRAMUSCULAR | Status: DC | PRN
  Administered 2022-07-23 – 2022-07-25 (×6): 0.5 mg via INTRAVENOUS
  Filled 2022-07-23 (×6): qty 1

## 2022-07-23 MED ORDER — HEPARIN (PORCINE) 25000 UT/250ML-% IV SOLN
1300.0000 [IU]/h | INTRAVENOUS | Status: DC
  Administered 2022-07-23: 1300 [IU]/h via INTRAVENOUS
  Filled 2022-07-23: qty 250

## 2022-07-23 MED ORDER — PANTOPRAZOLE SODIUM 40 MG IV SOLR
40.0000 mg | Freq: Two times a day (BID) | INTRAVENOUS | Status: DC
  Administered 2022-07-23 – 2022-07-24 (×2): 40 mg via INTRAVENOUS
  Filled 2022-07-23 (×2): qty 10

## 2022-07-23 MED ORDER — HEPARIN (PORCINE) 25000 UT/250ML-% IV SOLN
1300.0000 [IU]/h | INTRAVENOUS | Status: DC
  Administered 2022-07-23 – 2022-07-24 (×2): 1300 [IU]/h via INTRAVENOUS
  Filled 2022-07-23: qty 250

## 2022-07-23 MED ORDER — POLYETHYLENE GLYCOL 3350 17 G PO PACK
17.0000 g | PACK | Freq: Every day | ORAL | Status: DC
  Administered 2022-07-24 – 2022-07-25 (×2): 17 g via ORAL
  Filled 2022-07-23 (×2): qty 1

## 2022-07-23 NOTE — Progress Notes (Signed)
Progress Note  Patient Name: Allen Cortez Date of Encounter: 07/23/2022  Primary Cardiologist: Shelva Majestic, MD  Subjective   Patient had 1 episode of coffee-ground emesis yesterday afternoon after which Plavix was loaded and was started on maintenance dose of Plavix. He did not have any further episodes of bleeding.  Denies any chest discomfort or DOE since PCI.  Inpatient Medications    Scheduled Meds:  aspirin EC  81 mg Oral Daily   atorvastatin  40 mg Oral QHS   carvedilol  6.25 mg Oral BID WC   Chlorhexidine Gluconate Cloth  6 each Topical Q0600   citalopram  20 mg Oral QHS   clonazePAM  0.5 mg Oral QHS   clopidogrel  75 mg Oral Q breakfast   darbepoetin (ARANESP) injection - DIALYSIS  150 mcg Intravenous Q Sat-HD   feeding supplement (NEPRO CARB STEADY)  237 mL Oral TID BM   guaiFENesin  600 mg Oral BID   hydrALAZINE  25 mg Oral BID   insulin aspart  0-6 Units Subcutaneous TID AC & HS   isosorbide dinitrate  20 mg Oral BID   multivitamin  1 tablet Oral QHS   pantoprazole (PROTONIX) IV  40 mg Intravenous Q12H   QUEtiapine  300 mg Oral QHS   sodium chloride flush  3 mL Intravenous Q12H   Continuous Infusions:  sodium chloride     ferric gluconate (FERRLECIT) IVPB Stopped (07/21/22 1731)   promethazine (PHENERGAN) injection (IM or IVPB) 12.5 mg (07/23/22 1011)   PRN Meds: sodium chloride, acetaminophen **OR** acetaminophen, albuterol, alum & mag hydroxide-simeth, bisacodyl, HYDROmorphone (DILAUDID) injection, ondansetron, mouth rinse, promethazine (PHENERGAN) injection (IM or IVPB), sodium chloride flush   Vital Signs    Vitals:   07/23/22 1130 07/23/22 1200 07/23/22 1217 07/23/22 1228  BP: 129/72 129/76  130/72  Pulse: 98 97  96  Resp: (!) '21  20 20  '$ Temp:   97.8 F (36.6 C) 97.8 F (36.6 C)  TempSrc:   Oral Oral  SpO2: 100% 100%  94%  Weight:   64.2 kg   Height:        Intake/Output Summary (Last 24 hours) at 07/23/2022 1246 Last data filed at  07/23/2022 1217 Gross per 24 hour  Intake 571.17 ml  Output 400 ml  Net 171.17 ml   Filed Weights   07/21/22 1215 07/23/22 0744 07/23/22 1217  Weight: 64.6 kg 63.3 kg 64.2 kg    Telemetry     Personally reviewed, HR normal   ECG    An ECG dated 07/19/22 was personally reviewed today and demonstrated:  NSR and Q waves in anterior leads  Physical Exam   GEN: No acute distress.   Neck: No JVD. Cardiac: RRR, no murmur, rub, or gallop.  Respiratory: Nonlabored. Clear to auscultation bilaterally. GI: Soft, nontender, bowel sounds present. MS: No edema; No deformity. Neuro:  Nonfocal. Psych: Alert and oriented x 3. Normal affect.  Labs    Chemistry Recent Labs  Lab 07/18/22 1653 07/18/22 2045 07/18/22 2046 07/20/22 0948 07/21/22 0144 07/22/22 0541 07/23/22 0223  NA 137  --    < > 135 135 135 137  K 4.1  --    < > 4.3 3.9 3.7 4.7  CL 97*  --   --  97* 95* 95* 91*  CO2 28  --   --  '29 28 28 27  '$ GLUCOSE 130*  --   --  104* 114* 94 125*  BUN 51*  --   --  20 33* 20 40*  CREATININE 6.05*  --   --  3.83* 4.94* 3.97* 5.53*  CALCIUM 8.6*  --   --  8.5* 8.4* 8.4* 8.7*  PROT 6.2* 6.3*  --   --   --   --   --   ALBUMIN 2.7* 2.7*  --  2.6*  --   --   --   AST 18 19  --   --   --   --   --   ALT 11 11  --   --   --   --   --   ALKPHOS 68 59  --   --   --   --   --   BILITOT 0.5 0.7  --   --   --   --   --   GFRNONAA 9*  --   --  16* 12* 15* 10*  ANIONGAP 12  --   --  '9 12 12 '$ 19*   < > = values in this interval not displayed.     Hematology Recent Labs  Lab 07/22/22 0541 07/22/22 1457 07/22/22 2233 07/23/22 0223  WBC 10.8* 7.2  --  11.3*  RBC 2.51* 2.46*  --  2.75*  HGB 7.7* 7.8* 8.7* 8.8*  HCT 25.2* 24.6* 26.7* 26.5*  MCV 100.4* 100.0  --  96.4  MCH 30.7 31.7  --  32.0  MCHC 30.6 31.7  --  33.2  RDW 17.2* 17.3*  --  17.6*  PLT 213 212  --  173    Cardiac Enzymes Recent Labs  Lab 07/18/22 1653 07/18/22 1845 07/18/22 2045 07/19/22 0454  TROPONINIHS 266*  271* 256* 288*    BNP Recent Labs  Lab 07/18/22 1653  BNP 2,398.5*     DDimerNo results for input(s): "DDIMER" in the last 168 hours.   Cardiac studies    CARDIAC CATHETERIZATION  Result Date: 07/22/2022   Prox LAD to Mid LAD lesion is 100% stenosed.   Dist Cx lesion is 80% stenosed.   A drug-eluting stent was successfully placed using a SYNERGY XD 3.0X20.   Post intervention, there is a 0% residual stenosis. Successful PCI of the mid LCx with DES x 1 Plan: DAPT with ASA for one month, plavix for 12 months. OK to resume Coumadin tomorrow if no bleeding complications.    Cardiac MRI on 07/15/22 IMPRESSION: 1. Subendocardial LGE consistent with prior infarct in mid anterior/anteroseptal, apical anterior/septal/inferior/lateral walls, and apex. LGE is greater than 50% transmural suggesting nonviability in apical anterior/septal/inferior/lateral walls and apex. LGE <50% transmural in mid anterior wall but wall thickness <4.21m suggests nonviability. Findings are consistent with nonviability in mid to distal LAD territory 2. Severe LV dilatation with severe systolic dysfunction (EF 295%. Thinning/akinesis of mid to apical anterior/anteroseptal walls and apical inferior/lateral walls. Aneurysm of apex 3.  LV apical thrombus measures 150mx 22m28m.  Normal RV size and systolic function (EF 48%18%. Diffuse pulmonary opacities and bilateral pleural effusions (moderate on right, small on left), consider CT chest for further Evaluation.  Echo in 06/2022  1. 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  Left ventricular ejection fraction, by estimation, is 30-35%. The left  ventricle is abnormal. The left  ventricle has focal regional wall motion abnormalities. The left  ventricular internal cavity size was mildly dilated. There is akinesis of  the left ventricular, apical septal  wall,  inferior wall, anterior wall and  lateral wall. There is akinesis of the  left ventricular, entire apical segment. There is severe hypokinesis of  the left ventricular, basal-mid anterior wall. A falst tendon is present  in the mid LV cavity.  Assessment & Plan  Patient is a 71 year old male known to have CAD s/p two-vessel disease (LAD CTO and circumflex 90% disease) with LVEF 30 to 35%, PAF status post repair in 2013, ESRD, HTN, HLD, type 2 diabetes, RCC s/p partial left nephrectomy in 2015, OSA has CCS stage III symptoms, had a multidisciplinary team meeting and underwent left circumflex PCI.  #CAD with two-vessel disease, CTO of LAD and circumflex 80% disease with LVEF 30 to 35%, currently angina free -Continue aspirin 81 mg once daily, Plavix 75 mg daily and Coumadin for 1 month followed by Plavix 75 mg daily and Coumadin for 12 months, per IC recommendations. 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  He did not report any more bleeding episodes since yesterday afternoon.  Okay to resume heparin drip and start Coumadin.  Pharmacy consults placed for both heparin drip and Coumadin dosing. Goal INR is between 2 and 3.  I have spent a total of 47 minutes with patient reviewing chart , telemetry, EKGs, labs and examining patient as well as establishing an assessment and plan that was discussed with the patient.  > 50% of time was spent in direct patient care.     Signed, Chalmers Guest, MD  07/23/2022, 12:46  PM

## 2022-07-23 NOTE — Progress Notes (Deleted)
Patient had coffee ground emesis this am, I have contacted GI for consultation.  Plan to hold on heparin for now until further recommendations.  Patient will continue with aspirin and clopidogrel for coronary stent.

## 2022-07-23 NOTE — Progress Notes (Signed)
[facility] Progress Note   Subjective:   Patient seen and examined in dialysis-  cramping-  having to take goal down-  s/p DES to his circ yesterday -  had hematemesis after and still nauseated this AM  Objective Vitals:   07/23/22 0758 07/23/22 0800 07/23/22 0830 07/23/22 0900  BP: 139/86 139/86 (!) 139/91 (!) 144/90  Pulse: 97 98 99 96  Resp: 15 (!) 26 (!) 21 20  Temp:      TempSrc:      SpO2: 99% 98% 93% 98%  Weight:      Height:       Physical Exam General:chronically ill appearing male in NAD Heart:RRR, no mrg Lungs:CTAB, nml WOB on 3l O2 via [place] Abdomen:soft, NTND Extremities:no LE edema Dialysis Access: [facility]   Filed Weights   07/21/22 0745 07/21/22 1215 07/23/22 0744  Weight: 65.9 kg 64.6 kg 63.3 kg    Intake/Output Summary (Last 24 hours) at 07/23/2022 0912 Last data filed at 07/22/2022 2134 Gross per 24 hour  Intake 571.17 ml  Output 400 ml  Net 171.17 ml    Additional Objective Labs: Basic Metabolic Panel: Recent Labs  Lab 07/19/22 0454 07/20/22 0948 07/21/22 0144 07/22/22 0541 07/23/22 0223  NA  --  135 135 135 137  K  --  4.3 3.9 3.7 4.7  CL  --  97* 95* 95* 91*  CO2  --  '29 28 28 27  '$ GLUCOSE  --  104* 114* 94 125*  BUN  --  20 33* 20 40*  CREATININE  --  3.83* 4.94* 3.97* 5.53*  CALCIUM  --  8.5* 8.4* 8.4* 8.7*  PHOS 3.2 3.4  --   --   --    Liver Function Tests: Recent Labs  Lab 07/18/22 1653 07/18/22 2045 07/20/22 0948  AST 18 19  --   ALT 11 11  --   ALKPHOS 68 59  --   BILITOT 0.5 0.7  --   PROT 6.2* 6.3*  --   ALBUMIN 2.7* 2.7* 2.6*   CBC: Recent Labs  Lab 07/18/22 1653 07/18/22 2046 07/20/22 0208 07/21/22 0810 07/22/22 0541 07/22/22 1457 07/22/22 2233 07/23/22 0223  WBC 7.8   < > 8.5 6.5 10.8* 7.2  --  11.3*  NEUTROABS 5.1  --   --   --   --   --   --   --   HGB 6.6*   < > 8.0* 7.4* 7.7* 7.8* 8.7* 8.8*  HCT 21.2*   < > 24.3* 23.2* 25.2* 24.6* 26.7* 26.5*  MCV 99.1   < > 96.4 100.9* 100.4* 100.0   --  96.4  PLT 246   < > 207 174 213 212  --  173   < > = values in this interval not displayed.   Cardiac Enzymes: Recent Labs  Lab 07/18/22 2045  CKTOTAL 106   CBG: Recent Labs  Lab 07/21/22 2107 07/22/22 0745 07/22/22 1423 07/22/22 1616 07/22/22 2150  GLUCAP 136* 107* 113* 118* 134*    Medications:  sodium chloride     ferric gluconate (FERRLECIT) IVPB Stopped (07/21/22 1731)   promethazine (PHENERGAN) injection (IM or IVPB)      aspirin EC  81 mg Oral Daily   atorvastatin  40 mg Oral QHS   carvedilol  6.25 mg Oral BID WC   Chlorhexidine Gluconate Cloth  6 each Topical Q0600   citalopram  20 mg Oral QHS   clonazePAM  0.5 mg Oral QHS  clopidogrel  75 mg Oral Q breakfast   darbepoetin (ARANESP) injection - DIALYSIS  150 mcg Intravenous Q Sat-HD   feeding supplement (NEPRO CARB STEADY)  237 mL Oral TID BM   guaiFENesin  600 mg Oral BID   hydrALAZINE  25 mg Oral BID   insulin aspart  0-6 Units Subcutaneous TID AC & HS   isosorbide dinitrate  20 mg Oral BID   multivitamin  1 tablet Oral QHS   pantoprazole (PROTONIX) IV  40 mg Intravenous Daily   QUEtiapine  300 mg Oral QHS   sodium chloride flush  3 mL Intravenous Q12H    Dialysis Orders: [place] 4hr TTS EDW presumably 70kg 3/2.25, NO UFP Heparin 2K Units Mircera 50 q2weeks (last given 10/14)   Background: 71 y.o. 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 He is presenting with worsening shortness of breath, orthopnea, poor exercise tolerance;  he has decreased urine output  CXR showed Interval increased prominence of the interstitial markings and increased patchy densities in both lungs. Small bilateral pleural effusions.    Assessment/Plan: Acute hypoxic  resp failure - likely related to volume overload. CXR w/CHF and b/l pleural effusions.  Improved, now many kg under dry weight. Will need new dry weight on d/c.  AKI on CKD 3b - baseline creatinine 1.6 prior to recent heart cath.   - HD today per regular schedule.  - Minimal to no UOP, no signs of renal recovery so far. HD dependent for 6 weeks- this likely means ESRD-  cont on TTS schedule -Hx severe leg pain with dialysis. Order for Dilaudid 0.5 before HD and UF profile #2. At outpt HD he actually takes Oxycodone prescribed by cardiology for the leg pain per family.  HTN - home regimen low dose hydralazine, coreg and imdur; BP in goal w/Coreg 6.'25mg'$  BID alone now-  would not re add hydralazine. d/c home lasix d/t minimal to no UOP. CAD - sp LHC on 8/22 at [facility] as above and seen by CTS. MRI last Friday and may need CABG with total occlusion in the LAD.  He may not recover renal function and may need to proceed with CABG with the designation of ESRD.  CTS consulted - no plans for surgery at this time, high risk.  s/p DES to circ on 19/14 A/C systolic CHF - echo with EF 30-35%.  Meds limited by kidney function.  Volume removal with HD.  Anemia-  Hgb 7.7 s/p 1 unit pRBC. Tsat 19%, getting iron load.  ESA recently dosed as OP but will give ESA here tomorrow. Hgb improved  Bones-  CCa and phos in goal.  Not on binders or VDRA.  Nutrition - Currently on heart healthy/carb modified diet.  Add fluid restrictions. Alb 2.7 - start protein supplements.  [facility] 07/23/2022,9:12 AM  LOS: 5 days

## 2022-07-23 NOTE — Progress Notes (Addendum)
Progress Note   Patient: Allen Cortez KTG:[id number] DOB: [birth year] DOA: 07/18/2022     5 DOS: the patient was seen and examined on 07/23/2022   Brief hospital course: [name] was admitted to the hospital with volume overload in the setting of ESRD, complicated with coronary artery disease. Hospitalization complicated with upper GI bleed.   71 yo male with the past medical history of CAD, systolic heart failure, LV thrombus, right renal cancer sp partial left nephrectomy, T2DM, ESRD and chronic hypoxemic respiratory failure who presented with dyspnea. At home he had progressive dyspnea and orthopnea despite renal replacement therapy with ultrafiltration on T-TH-Sat schedule. On his initial physical examination his blood pressure was 148/94, HT 94, lungs with no wheezing, heart with S1 and S2 present and rhythmic, abdomen with no distention, no lower extremity edema.   Na 137, K 4,1 CL 97 bicarbonate 28 glucose 130 bun 51 cr 6,0  BNP 2,398 High sensitive troponin 266, 271  Wbc 7,8 hgb 6,6 plt [place] 19 negative   Chest radiograph with cardiomegaly, bilateral interstitial infiltrates, symmetric, bilateral small pleural effusions, right IJ vein HD catheter in place.   EKG 85 bpm, left axis deviation, normal intervals, sinus rhythm with poor R to R wave progression, with no significant ST segment or T wave changes, positive LVH.   10/17 Patient had urgent HD with ultrafiltration  10/19 HD. Plan for multidisciplinary cardiovascular meeting tomorrow to decide if patient will benefit more from PCI than CABG.  10/20 PCI  Coffee ground emesis and abdominal pain post procedure.  1 unit PRBC transfusion.   Assessment and Plan: Acute kidney injury (AKI) with acute tubular necrosis (ATN) (HCC) ESRD on HD.  Patient with improving volume status  Plan to continue ultrafiltration and hemodialysis per nephrology recommendations.  Acute on chronic hypoxemic respiratory failure due  to acute pulmonary edema Oxygenation has improved 02 saturation today 100% on 3 L/min per [place].   Anemia of chronic renal disease with iron deficiency  Continue with EPO and iron.   Acute on chronic systolic congestive heart failure (HCC)  Echocardiogram with reduced LV systolic function EF 30 to 35%, akinesis of the LV apical segment wall, inferior wall, anterior wall and lateral wall. Akinesis of the entire apical segment. Severe hypokinesis of the left ventricle, basal mid anterior wall. Preserved RV systolic function. RVSP 45.5 mmHg.   Continue with carvedilol After load reduction with hydralazine and isosorbide.    LV thrombus, heparin has been stopped, INR is 1,3. Pharmacy will be consulted for further warfarin doses once acute bleeding has been ruled out.    Acute on chronic systolic CHF (congestive heart failure) (HCC)-resolved as of 07/21/2022  Echocardiogram with reduced LV systolic function EF 30 to 35%, akinesis of the LV apical segment wall, inferior wall, anterior wall and lateral wall. Akinesis of the entire apical segment. Severe hypokinesis of the left ventricle, basal mid anterior wall. Preserved RV systolic function. RVSP 45.5 mmHg.  Systolic blood pressure  428 to 130 mmHg.   Continue close blood pressure monitoring.  Continue with carvedilol After load reduction with hydralazine and isosorbide.   Continue heparin for LV thrombus, transition to warfarin after completing cardiac workup.   Coronary artery disease involving native coronary artery of native heart with angina pectoris ([place]): 100% mLAD, 80% m(AVG)LCx Cardiac catheterization with 2 vessel disease, LAD 80%, Lcx.    Continue medical therapy with aspirin/ clopidogrel and B blockade. Statin therapy.  Cardiac MRI with mid anterior/  anteroseptal, apical, septal, inferior, lateral walls and apex with LGE consistent with prior infarct. LGE greater than 50%, suggesting nonviability in apical anterior, septal,  inferior and lateral walls and apex.  Nonviability in mid to distal LAD territory    Catheterization Prox LAD to Mid LAD 100% stenosed  Distal Cx lesion 80% stenosed  Patient had a successful drug eluding stent placed, to the mid LCx, Recommendations to continue warfarin, aspirin for 1 month and clopidogrel for 6 months.   Coronary atherosclerosis of native coronary artery-resolved as of 07/21/2022 Cardiac catheterization with 2 vessel disease, LAD 80%, Lcx.   Continue medical therapy with aspirin and B blockade. Statin therapy.  Cardiac MRI with mid anterior/ anteroseptal, apical, septal, inferior, lateral walls and apex with LGE consistent with prior infarct. LGE greater than 50%, suggesting nonviability in apical anterior, septal, inferior and lateral walls and apex.  Nonviability in mid to distal LAD territory   Patient likely not candidate for surgery revascularization CABG. Follow up with cardiology recommendations.  Continue medical therapy with aspirin and statin.   Type 2 diabetes mellitus with hyperlipidemia (HCC) Continue insulin sliding scale for glucose cover and monitoring Patient is tolerating po well.   GERD (gastroesophageal reflux disease) Continue with antiacid therapy.   PAD (peripheral artery disease) ([place]) Continue with statin therapy.   Upper GI bleed Patient with coffee ground emesis Requires one unit PRBC for acute blood loss anemia  Follow up Hgb is 8,7 Hemodynamically stable. Continue pantoprazole IV q12 hrs Consult GI for further recommendations         Subjective: Patient with abdominal pain, continue to have nausea with coffee ground emesis  Physical Exam: Vitals:   07/23/22 1000 07/23/22 1030 07/23/22 1100 07/23/22 1130  BP: (!) 132/96 139/86 (!) 135/90 129/72  Pulse: 95 94 96 98  Resp: '20 20 20 '$ (!) 21  Temp:      TempSrc:      SpO2: 100% 99% 100% 100%  Weight:      Height:       Examined during HD  Neurology awake and  alert ENT with mild pallor Cardiovascular with S1 and S2 present and rhythmic with no gallops Respiratory with no rales or wheezing Abdomen mild tender to superficial palpation at the lower abdomen with no rebound or guarding  Data Reviewed:    Family Communication: no family at the bedside   Disposition: Status is: Inpatient Remains inpatient appropriate because: GI bleeding   Planned Discharge Destination: Home    Author: Tawni Millers, MD 07/23/2022 11:53 AM  For on call review [email or web address].

## 2022-07-23 NOTE — Progress Notes (Signed)
1330-1410 Cardiac Rehab On arrival pt in bed states that he is exhausted. He is asking for something to eat. Checked with his nurse and he is NPO. Reported this to the pt. Assisted him to the [facility] no urine or BM. Pt complains of severe leg pain and is asking for something for pain. Reported this to the nurse. We will follow pt on Monday. He is not ready for ambulation or education at present time.

## 2022-07-23 NOTE — Assessment & Plan Note (Signed)
Patient with coffee ground emesis Sp 2 units PRBC transfusion   Follow up Hgb is 8,2 Patient with no further signs of bleeding Continue with pantoprazole.

## 2022-07-23 NOTE — Progress Notes (Signed)
ANTICOAGULATION CONSULT NOTE  Pharmacy Consult for heparin  Indication: LV thrombus  Allergies  Allergen Reactions   Dilaudid [Hydromorphone Hcl] Other (See Comments)    hallucinations   Mirapex [Pramipexole Dihydrochloride] Other (See Comments)    Leg pain   Prednisone Other (See Comments)    irritability   Gabapentin     Other reaction(s): Tremor    Patient Measurements: Height: '5\' 10"'$  (177.8 cm) Weight: 64.2 kg (141 lb 8.6 oz) (bed scale) IBW/kg (Calculated) : 73  Vital Signs: Temp: 97.9 F (36.6 C) (10/21 1304) Temp Source: Oral (10/21 1304) BP: 130/80 (10/21 1304) Pulse Rate: 108 (10/21 1304)  Labs: Recent Labs    07/21/22 0144 07/21/22 0737 07/21/22 0810 07/21/22 2234 07/22/22 0541 07/22/22 1457 07/22/22 2233 07/23/22 0223  HGB  --   --    < >  --  7.7* 7.8* 8.7* 8.8*  HCT  --   --    < >  --  25.2* 24.6* 26.7* 26.5*  PLT  --   --    < >  --  213 212  --  173  LABPROT  --  19.7*  --   --  16.7*  --   --  16.3*  INR  --  1.7*  --   --  1.4*  --   --  1.3*  HEPARINUNFRC  --   --   --  <0.10* 0.15*  --   --   --   CREATININE 4.94*  --   --   --  3.97*  --   --  5.53*   < > = values in this interval not displayed.     Estimated Creatinine Clearance: 11 mL/min (A) (by C-G formula based on SCr of 5.53 mg/dL (H)).   Medical History: Past Medical History:  Diagnosis Date   AAA (abdominal aortic aneurysm) (HCC)    Abdominal wall mass of left lower quadrant    Basal cell carcinoma    CAD (coronary artery disease) 2011   moderate   Colon polyp    DDD (degenerative disc disease)    Depression    Diabetes mellitus type II    no meds   GERD (gastroesophageal reflux disease)    Hyperlipidemia    Neuromuscular disorder (HCC)    Osteopenia    PAD (peripheral artery disease) (HCC)    Renal cell carcinoma    Restless leg syndrome    Sleep apnea    used a cpap yr ago-does not snore or use it now.    Medications:  Medications Prior to Admission   Medication Sig Dispense Refill Last Dose   acetaminophen (TYLENOL) 325 MG tablet Take 2 tablets (650 mg total) by mouth every 4 (four) hours as needed for headache or mild pain.   Past Month   aspirin EC 81 MG tablet Take 1 tablet (81 mg total) by mouth daily. Swallow whole. 30 tablet 3 07/18/2022   atorvastatin (LIPITOR) 40 MG tablet Take 40 mg by mouth at bedtime.   07/17/2022   B Complex-C-Zn-Folic Acid (DIALYVITE 160-FUXN 15) 0.8 MG TABS Take 1 tablet by mouth 3 (three) times daily as needed (with meals).   Past Week   carvedilol (COREG) 6.25 MG tablet Take 1 tablet (6.25 mg total) by mouth 2 (two) times daily with a meal. 180 tablet 3 07/18/2022 at 0900   citalopram (CELEXA) 40 MG tablet Take 40 mg by mouth every evening.   07/17/2022   clonazePAM (KLONOPIN) 0.5 MG tablet  Take 0.5 mg by mouth at bedtime.   07/17/2022   furosemide (LASIX) 40 MG tablet Take 1 tablet (40 mg total) by mouth 2 (two) times daily. 180 tablet 3 07/18/2022   hydrALAZINE (APRESOLINE) 25 MG tablet Take 1 tablet (25 mg total) by mouth every 8 (eight) hours. 270 tablet 3 07/18/2022   isosorbide mononitrate (IMDUR) 30 MG 24 hr tablet Take 0.5 tablets (15 mg total) by mouth daily. 45 tablet 3 07/18/2022   lansoprazole (PREVACID) 15 MG capsule Take 15 mg by mouth every morning.   07/18/2022   multivitamin (RENA-VIT) TABS tablet Take 1 tablet by mouth at bedtime. 30 tablet 0 07/17/2022   Nutritional Supplements (NEPRO) LIQD Take 237 mLs by mouth 2 (two) times daily.   07/18/2022   QUEtiapine (SEROQUEL) 300 MG tablet Take 300 mg by mouth at bedtime.   07/17/2022   warfarin (COUMADIN) 5 MG tablet Take 1 tablet daily Monday, Wednesday, Friday, Saturday and Sunday and take half tablet daily on Tuesday and Thursday. (Patient taking differently: Take 2.5-5 mg by mouth See admin instructions. Take 2.5 mg (1/2 tablet) by mouth daily every day of the week except for Monday's. On Monday's take 5 mg (1 tablet).) 30 tablet 0 07/17/2022 at  2000   feeding supplement (ENSURE ENLIVE / ENSURE PLUS) LIQD Take 237 mLs by mouth 2 (two) times daily between meals. (Patient not taking: Reported on 07/18/2022) 14220 mL 0 Not Taking    Assessment: 71 yo male on warfarin PTA for recent LV thrombus. He is also noted with ESRD and s/p emergent HD. S/p PCI 10/20, heparin held d/t 1 episode of coffee-ground emesis, no reports of bleeding since. Pharmacy consulted to bridge heparin to warfarin.   Patient previously subtherapeutic with heparin infusion at 1250 units/hr. CBC stable, INR 1.3   Goal of Therapy:  INR 2-3 Heparin level 0.3-0.7 units/ml Monitor platelets by anticoagulation protocol: Yes   Plan:  Start heparin 1300 units/hr Warfarin 5 mg x1  Check 8 hr heparin level Daily heparin level, CBC, INR Monitor for s/sx of bleeding   Francena Hanly, PharmD Pharmacy Resident  07/23/2022 1:23 PM

## 2022-07-23 NOTE — Consult Note (Signed)
Referring Provider:  [facility] Primary Care Physician:  Gerome Sam, MD Primary Gastroenterologist:  Dr. Michail Sermon  Reason for Consultation: GI bleed  HPI: Allen Cortez is a 71 y.o. male with past medical history of end-stage renal disease on dialysis, history of coronary artery disease, heart failure, LV thrombus, right renal cell cancer status post partial nephrectomy, diabetes and chronic hypoxemic respiratory failure presented to the hospital with dyspnea.  Further work-up showed ischemic cardiomyopathy with EF of 30 to 35%.  Was initially started on heparin drip for LV thrombus.  Underwent PCI with stent placement yesterday.  He was loaded on Plavix and was started on maintenance dose of Plavix.  Had 1 episode of coffee-ground emesis.  Looks like patient with anemia dating back to September 2023.Hemoglobin in range of 7-8 during this admission.  GI is consulted for further evaluation.  Cardiology recommendation was for aspirin 81 mg daily, Plavix 75 mg daily and Coumadin daily for 1 month followed by Plavix 75 mg daily and Coumadin for 12 months.  Patient seen and examined at bedside.  Patient's wife at bedside.  He is complaining of intermittent constipation for last few weeks.  Has also noted black-colored stool in the last few weeks.  Denies any vomiting today but having black-colored stool today as well.  Complaining of lower abdominal discomfort.  Colonoscopy was around 10 years ago which showed some hyperplastic polyps.  Denies NSAID use.  Past Medical History:  Diagnosis Date   AAA (abdominal aortic aneurysm) (HCC)    Abdominal wall mass of left lower quadrant    Basal cell carcinoma    CAD (coronary artery disease) 2011   moderate   Colon polyp    DDD (degenerative disc disease)    Depression    Diabetes mellitus type II    no meds   GERD (gastroesophageal reflux disease)    Hyperlipidemia    Neuromuscular disorder (HCC)    Osteopenia    PAD (peripheral artery  disease) (HCC)    Renal cell carcinoma    Restless leg syndrome    Sleep apnea    used a cpap yr ago-does not snore or use it now.    Past Surgical History:  Procedure Laterality Date   ABDOMINAL AORTIC ANEURYSM REPAIR     HEMORRHOID SURGERY     HERNIA REPAIR  03/06/12   ventral hernia repair   IR FLUORO GUIDE CV LINE RIGHT  06/20/2022   IR US GUIDE VASC ACCESS RIGHT  06/20/2022   NEPHRECTOMY     left due to renal cell carcenoma   VENTRAL HERNIA REPAIR  03/06/2012   Procedure: HERNIA REPAIR VENTRAL ADULT;  Surgeon: Joyice Faster. Cornett, MD;  Location: [place];  Service: General;  Laterality: N/A;  excision of stitch granuloma    Prior to Admission medications   Medication Sig Start Date End Date Taking? Authorizing Provider  acetaminophen (TYLENOL) 325 MG tablet Take 2 tablets (650 mg total) by mouth every 4 (four) hours as needed for headache or mild pain. 06/27/22  Yes Arrien, Jimmy Picket, MD  aspirin EC 81 MG tablet Take 1 tablet (81 mg total) by mouth daily. Swallow whole. 07/11/22  Yes Troy Sine, MD  atorvastatin (LIPITOR) 40 MG tablet Take 40 mg by mouth at bedtime. 05/24/22  Yes [provider]  B Complex-C-Zn-Folic Acid (DIALYVITE 650-PTWS 15) 0.8 MG TABS Take 1 tablet by mouth 3 (three) times daily as needed (with meals). 07/05/22  Yes [provider]  carvedilol (COREG) 6.25 MG tablet Take 1 tablet (6.25 mg total) by mouth 2 (two) times daily with a meal. 07/14/22  Yes Troy Sine, MD  citalopram (CELEXA) 40 MG tablet Take 40 mg by mouth every evening.   Yes [provider]  clonazePAM (KLONOPIN) 0.5 MG tablet Take 0.5 mg by mouth at bedtime. 10/03/21  Yes [provider]  furosemide (LASIX) 40 MG tablet Take 1 tablet (40 mg total) by mouth 2 (two) times daily. 07/14/22  Yes Troy Sine, MD  hydrALAZINE (APRESOLINE) 25 MG tablet Take 1 tablet (25 mg total) by mouth every 8 (eight) hours. 07/14/22  Yes Troy Sine,  MD  isosorbide mononitrate (IMDUR) 30 MG 24 hr tablet Take 0.5 tablets (15 mg total) by mouth daily. 07/14/22  Yes Troy Sine, MD  lansoprazole (PREVACID) 15 MG capsule Take 15 mg by mouth every morning. 01/24/22  Yes [provider]  multivitamin (RENA-VIT) TABS tablet Take 1 tablet by mouth at bedtime. 06/27/22 07/27/22 Yes Arrien, Jimmy Picket, MD  Nutritional Supplements (NEPRO) LIQD Take 237 mLs by mouth 2 (two) times daily.   Yes [provider]  QUEtiapine (SEROQUEL) 300 MG tablet Take 300 mg by mouth at bedtime. 10/03/17  Yes [provider]  warfarin (COUMADIN) 5 MG tablet Take 1 tablet daily Monday, Wednesday, Friday, Saturday and Sunday and take half tablet daily on Tuesday and Thursday. Patient taking differently: Take 2.5-5 mg by mouth See admin instructions. Take 2.5 mg (1/2 tablet) by mouth daily every day of the week except for Monday's. On Monday's take 5 mg (1 tablet). 06/27/22  Yes Arrien, Jimmy Picket, MD  feeding supplement (ENSURE ENLIVE / ENSURE PLUS) LIQD Take 237 mLs by mouth 2 (two) times daily between meals. Patient not taking: Reported on 07/18/2022 06/27/22 07/27/22  Arrien, Jimmy Picket, MD    Scheduled Meds:  aspirin EC  81 mg Oral Daily   atorvastatin  40 mg Oral QHS   carvedilol  6.25 mg Oral BID WC   Chlorhexidine Gluconate Cloth  6 each Topical Q0600   citalopram  20 mg Oral QHS   clonazePAM  0.5 mg Oral QHS   clopidogrel  75 mg Oral Q breakfast   darbepoetin (ARANESP) injection - DIALYSIS  150 mcg Intravenous Q Sat-HD   feeding supplement (NEPRO CARB STEADY)  237 mL Oral TID BM   guaiFENesin  600 mg Oral BID   hydrALAZINE  25 mg Oral BID   insulin aspart  0-6 Units Subcutaneous TID AC & HS   isosorbide dinitrate  20 mg Oral BID   multivitamin  1 tablet Oral QHS   pantoprazole (PROTONIX) IV  40 mg Intravenous Q12H   QUEtiapine  300 mg Oral QHS   sodium chloride flush  3 mL Intravenous Q12H   warfarin  5 mg Oral  ONCE-1600   Warfarin - Pharmacist Dosing Inpatient   Does not apply q1600   Continuous Infusions:  sodium chloride     ferric gluconate (FERRLECIT) IVPB Stopped (07/21/22 1731)   heparin 1,300 Units/hr (07/23/22 1529)   promethazine (PHENERGAN) injection (IM or IVPB) Stopped (07/23/22 1026)   PRN Meds:.sodium chloride, acetaminophen **OR** acetaminophen, albuterol, alum & mag hydroxide-simeth, bisacodyl, HYDROmorphone (DILAUDID) injection, ondansetron, mouth rinse, promethazine (PHENERGAN) injection (IM or IVPB), sodium chloride flush  Allergies as of 07/18/2022 - Review Complete 07/18/2022  Allergen Reaction Noted   Dilaudid [hydromorphone hcl] Other (See Comments) 01/06/2012   Mirapex [pramipexole dihydrochloride] Other (See Comments) 01/06/2012  Prednisone Other (See Comments) 01/06/2012   Gabapentin  07/13/2022    Family History  Problem Relation Age of Onset   Diabetes Mother    Aneurysm Mother        in brain   Cancer Father        lung   Depression Father    Cancer Sister        lung, breast   Pancreatitis Brother    Heart disease Sister        MI   Depression Sister     Social History   Socioeconomic History   Marital status: Married    Spouse name: Not on file   Number of children: Not on file   Years of education: Not on file   Highest education level: Not on file  Occupational History   Not on file  Tobacco Use   Smoking status: Former    Packs/day: 1.00    Types: Cigarettes    Quit date: 05/09/2022    Years since quitting: 0.2   Smokeless tobacco: Never  Substance and Sexual Activity   Alcohol use: Yes    Comment: glass of wine at night   Drug use: No   Sexual activity: Not on file  Other Topics Concern   Not on file  Social History Narrative   Not on file   Social Determinants of Health   Financial Resource Strain: Not on file  Food Insecurity: No Food Insecurity (07/19/2022)   Hunger Vital Sign    Worried About Running Out of Food in the  Last Year: Never true    [place] in the Last Year: Never true  Transportation Needs: No Transportation Needs (07/19/2022)   PRAPARE - Hydrologist (Medical): No    Lack of Transportation (Non-Medical): No  Physical Activity: Not on file  Stress: Not on file  Social Connections: Not on file  Intimate Partner Violence: Not At Risk (07/19/2022)   Humiliation, Afraid, Rape, and Kick questionnaire    Fear of Current or Ex-Partner: No    Emotionally Abused: No    Physically Abused: No    Sexually Abused: No    Review of Systems: All negative except as stated above in HPI.  Physical Exam: Vital signs: Vitals:   07/23/22 1228 07/23/22 1304  BP: 130/72 130/80  Pulse: 96 (!) 108  Resp: 20 20  Temp: 97.8 F (36.6 C) 97.9 F (36.6 C)  SpO2: 94% 97%   Last BM Date : 07/22/22 General: Weak appearing patient, resting comfortably, not in acute distress, O2 by nasal cannula HEENT -Normocephalic, atraumatic, extraocular movement intact Lungs: Decreased breath sounds bilaterally Heart:  Regular rate and rhythm; no murmurs, clicks, rubs,  or gallops. Abdomen: Soft, nontender, nondistended, bowel sounds present, no peritoneal signs Neuro -alert and oriented x3 Psych -mood and affect normal Rectal:  Deferred  GI:  Lab Results: Recent Labs    07/22/22 0541 07/22/22 1457 07/22/22 2233 07/23/22 0223  WBC 10.8* 7.2  --  11.3*  HGB 7.7* 7.8* 8.7* 8.8*  HCT 25.2* 24.6* 26.7* 26.5*  PLT 213 212  --  173   BMET Recent Labs    07/21/22 0144 07/22/22 0541 07/23/22 0223  NA 135 135 137  K 3.9 3.7 4.7  CL 95* 95* 91*  CO2 '28 28 27  '$ GLUCOSE 114* 94 125*  BUN 33* 20 40*  CREATININE 4.94* 3.97* 5.53*  CALCIUM 8.4* 8.4* 8.7*   LFT No results  for input(s): "PROT", "ALBUMIN", "AST", "ALT", "ALKPHOS", "BILITOT", "BILIDIR", "IBILI" in the last 72 hours. PT/INR Recent Labs    07/22/22 0541 07/23/22 0223  LABPROT 16.7* 16.3*  INR 1.4* 1.3*      Studies/Results: CARDIAC CATHETERIZATION  Result Date: 07/22/2022   Prox LAD to Mid LAD lesion is 100% stenosed.   Dist Cx lesion is 80% stenosed.   A drug-eluting stent was successfully placed using a SYNERGY XD 3.0X20.   Post intervention, there is a 0% residual stenosis. Successful PCI of the mid LCx with DES x 1 Plan: DAPT with ASA for one month, plavix for 12 months. OK to resume Coumadin tomorrow if no bleeding complications.    Impression/Plan: -Coffee-ground emesis and melena.  Patient had black-colored stool prior to his hospitalization.  Had 2 episode of coffee-ground emesis.  Also having black stool while in hospital.  Most likely ulcer disease, gastritis, esophagitis versus AVM from his end-stage renal disease. -Coronary artery disease s/p PCI yesterday.  Currently on aspirin, Plavix and Coumadin -End Stage renal disease on dialysis -Ischemic cardiomyopathy -COPD  Recommendations -------------------------- -Difficult situation as patient in need of Plavix along with anticoagulation.  Given no further coffee-ground emesis, I think it is okay to continue antiplatelet and anticoagulation. -Not a candidate for EGD as it would require holding Plavix and Coumadin which will put him at high risk for cardiac events. -Continue IV Protonix 40 mg twice a day while in the hospital. -Switch to Protonix 40 mg p.o. twice daily for 4 weeks after discharge followed by Protonix 40 mg daily -If you start having more bleeding, recommend to hold aspirin -Okay to have soft diet from GI standpoint. -GI will follow    LOS: 5 days   Otis Brace  MD, FACP 07/23/2022, 3:41 PM  Contact #  [phone]

## 2022-07-23 NOTE — Plan of Care (Signed)
  Problem: Health Behavior/Discharge Planning: Goal: Ability to identify and utilize available resources and services will improve Outcome: Progressing Goal: Ability to manage health-related needs will improve Outcome: Progressing   Problem: Nutritional: Goal: Maintenance of adequate nutrition will improve Outcome: Progressing Goal: Progress toward achieving an optimal weight will improve Outcome: Progressing   Problem: Clinical Measurements: Goal: Respiratory complications will improve Outcome: Progressing   Problem: Clinical Measurements: Goal: Cardiovascular complication will be avoided Outcome: Progressing   Problem: Activity: Goal: Risk for activity intolerance will decrease Outcome: Progressing   Problem: Pain Managment: Goal: General experience of comfort will improve Outcome: Progressing   Problem: Safety: Goal: Ability to remain free from injury will improve Outcome: Progressing   Problem: Skin Integrity: Goal: Risk for impaired skin integrity will decrease Outcome: Progressing   Problem: Clinical Measurements: Goal: Will remain free from infection Outcome: Progressing   Problem: Cardiovascular: Goal: Ability to achieve and maintain adequate cardiovascular perfusion will improve Outcome: Progressing Goal: Vascular access site(s) Level 0-1 will be maintained Outcome: Progressing

## 2022-07-23 NOTE — Procedures (Signed)
Patient was seen on dialysis and the procedure was supervised.  BFR 300  Via TDC BP is  144/90.   Patient appears to be tolerating treatment well  Louis Meckel 07/23/2022

## 2022-07-23 NOTE — Progress Notes (Addendum)
Received patient in bed to unit.  Alert and oriented.  Informed consent signed and in chart.   Treatment initiated: 0810 Treatment completed: 1210  Patient tolerated well.  Transported back to the room  Alert, without acute distress.  Hand-off given to patient's nurse.   Access used: catheter Access issues: lines reversed  Total UF removed: 0. (-700 ml) Medication(s) given: Aranesp 150 mcg. Phenrgan 12.5 mg IV Post HD VS: 129/60 P 90 R 20  Post HD weight: 62.9kg   [facility]

## 2022-07-24 DIAGNOSIS — K922 Gastrointestinal hemorrhage, unspecified: Secondary | ICD-10-CM

## 2022-07-24 DIAGNOSIS — I255 Ischemic cardiomyopathy: Secondary | ICD-10-CM | POA: Diagnosis not present

## 2022-07-24 DIAGNOSIS — I5023 Acute on chronic systolic (congestive) heart failure: Secondary | ICD-10-CM | POA: Diagnosis not present

## 2022-07-24 DIAGNOSIS — N170 Acute kidney failure with tubular necrosis: Secondary | ICD-10-CM | POA: Diagnosis not present

## 2022-07-24 DIAGNOSIS — I25119 Atherosclerotic heart disease of native coronary artery with unspecified angina pectoris: Secondary | ICD-10-CM | POA: Diagnosis not present

## 2022-07-24 DIAGNOSIS — I513 Intracardiac thrombosis, not elsewhere classified: Secondary | ICD-10-CM | POA: Diagnosis not present

## 2022-07-24 DIAGNOSIS — Z7901 Long term (current) use of anticoagulants: Secondary | ICD-10-CM | POA: Diagnosis not present

## 2022-07-24 DIAGNOSIS — E1169 Type 2 diabetes mellitus with other specified complication: Secondary | ICD-10-CM | POA: Diagnosis not present

## 2022-07-24 LAB — HEMOGLOBIN AND HEMATOCRIT, BLOOD
HCT: 24.8 % — ABNORMAL LOW (ref 39.0–52.0)
Hemoglobin: 8.1 g/dL — ABNORMAL LOW (ref 13.0–17.0)

## 2022-07-24 LAB — PROTIME-INR
INR: 1.9 — ABNORMAL HIGH (ref 0.8–1.2)
Prothrombin Time: 21.2 seconds — ABNORMAL HIGH (ref 11.4–15.2)

## 2022-07-24 LAB — CBC
HCT: 21.9 % — ABNORMAL LOW (ref 39.0–52.0)
Hemoglobin: 7.2 g/dL — ABNORMAL LOW (ref 13.0–17.0)
MCH: 32 pg (ref 26.0–34.0)
MCHC: 32.9 g/dL (ref 30.0–36.0)
MCV: 97.3 fL (ref 80.0–100.0)
Platelets: 154 10*3/uL (ref 150–400)
RBC: 2.25 MIL/uL — ABNORMAL LOW (ref 4.22–5.81)
RDW: 17.5 % — ABNORMAL HIGH (ref 11.5–15.5)
WBC: 14.8 10*3/uL — ABNORMAL HIGH (ref 4.0–10.5)
nRBC: 0.2 % (ref 0.0–0.2)

## 2022-07-24 LAB — GLUCOSE, CAPILLARY
Glucose-Capillary: 134 mg/dL — ABNORMAL HIGH (ref 70–99)
Glucose-Capillary: 142 mg/dL — ABNORMAL HIGH (ref 70–99)
Glucose-Capillary: 134 mg/dL — ABNORMAL HIGH (ref 70–99)
Glucose-Capillary: 136 mg/dL — ABNORMAL HIGH (ref 70–99)

## 2022-07-24 LAB — LIPOPROTEIN A (LPA): Lipoprotein (a): 131.1 nmol/L — ABNORMAL HIGH (ref <75.0)

## 2022-07-24 LAB — HEPARIN LEVEL (UNFRACTIONATED)
Heparin Unfractionated: 0.34 IU/mL (ref 0.30–0.70)
Heparin Unfractionated: 0.32 IU/mL (ref 0.30–0.70)

## 2022-07-24 LAB — PREPARE RBC (CROSSMATCH)

## 2022-07-24 MED ORDER — PANTOPRAZOLE INFUSION (NEW) - SIMPLE MED
8.0000 mg/h | INTRAVENOUS | Status: DC
  Administered 2022-07-24 – 2022-07-26 (×5): 8 mg/h via INTRAVENOUS
  Filled 2022-07-24 (×7): qty 100

## 2022-07-24 MED ORDER — SODIUM CHLORIDE 0.9% IV SOLUTION: Freq: Once | INTRAVENOUS | Status: AC

## 2022-07-24 MED ORDER — WARFARIN SODIUM 2.5 MG PO TABS: 2.5000 mg | ORAL_TABLET | Freq: Once | ORAL | Status: DC

## 2022-07-24 NOTE — Progress Notes (Signed)
[facility] Gastroenterology Progress Note  Allen Cortez 71 y.o. 04/25/1951  CC: Coffee-ground emesis, recent PCI   Subjective: Patient seen and examined at bedside.  Denies any further bleeding episodes.  No bowel movement in last 2 days.  Complaining of suprapubic and lower abdominal discomfort.  ROS : afebrile, negative for nausea and vomiting   Objective: Vital signs in last 24 hours: Vitals:   07/24/22 1038 07/24/22 1103  BP: 108/71 109/74  Pulse: 100 97  Resp: 18 16  Temp: 97.7 F (36.5 C) 97.7 F (36.5 C)  SpO2: 94% 96%    Physical Exam:  General:  Alert, cooperative, no distress, appears stated age  Head:  Normocephalic, without obvious abnormality, atraumatic  Eyes:  , EOM's intact,   Lungs:   No visible respiratory distress  Heart:  Regular rate and rhythm, S1, S2 normal  Abdomen:   Soft, left lower quadrant suprapubic discomfort on palpation, bowel sounds present, no peritoneal signs          Lab Results: Recent Labs    07/22/22 0541 07/23/22 0223  NA 135 137  K 3.7 4.7  CL 95* 91*  CO2 28 27  GLUCOSE 94 125*  BUN 20 40*  CREATININE 3.97* 5.53*  CALCIUM 8.4* 8.7*   No results for input(s): "AST", "ALT", "ALKPHOS", "BILITOT", "PROT", "ALBUMIN" in the last 72 hours. Recent Labs    07/23/22 0223 07/24/22 0145  WBC 11.3* 14.8*  HGB 8.8* 7.2*  HCT 26.5* 21.9*  MCV 96.4 97.3  PLT 173 154   Recent Labs    07/23/22 0223 07/24/22 0145  LABPROT 16.3* 21.2*  INR 1.3* 1.9*      Assessment/Plan: -Coffee-ground emesis and melena.  Patient had black-colored stool prior to his hospitalization.  Had 2 episode of coffee-ground emesis during hospitalization.  Bowel movement since yesterday.  Most likely ulcer disease, gastritis, esophagitis versus AVM from his end-stage renal disease. -Coronary artery disease s/p PCI  10/20.  Currently on aspirin, Plavix and Coumadin heparin -End Stage renal disease on dialysis -Ischemic cardiomyopathy -COPD    Recommendations -------------------------- --Drop in hemoglobin noted this morning but no overt bleeding.  No bowel movement since yesterday. -MiraLAX was started yesterday but patient did not took it.  Advised to take MiraLAX which will help him with the constipation. -Given recent PCI, okay to continue aspirin and Plavix from GI standpoint unless starts having overt bleeding.  I think it is reasonable to hold Coumadin if continues to have drop in hemoglobin.  If hemoglobin stable, okay to resume heparin drip from GI standpoint. -Currently getting blood transfusion. -Change IV twice daily PPI to Protonix drip  Difficult situation of GI bleeding in setting of use of aspirin, Plavix heparin and Coumadin.  Discussed with family at bedside.   Otis Brace MD, [place] 07/24/2022, 12:53 PM  Contact #  [phone]

## 2022-07-24 NOTE — Plan of Care (Signed)
  Problem: Coping: Goal: Ability to adjust to condition or change in health will improve Outcome: Progressing   Problem: Fluid Volume: Goal: Ability to maintain a balanced intake and output will improve Outcome: Progressing   Problem: Health Behavior/Discharge Planning: Goal: Ability to identify and utilize available resources and services will improve Outcome: Progressing Goal: Ability to manage health-related needs will improve Outcome: Progressing   Problem: Metabolic: Goal: Ability to maintain appropriate glucose levels will improve Outcome: Progressing   Problem: Clinical Measurements: Goal: Respiratory complications will improve Outcome: Progressing   Problem: Clinical Measurements: Goal: Cardiovascular complication will be avoided Outcome: Progressing   Problem: Activity: Goal: Risk for activity intolerance will decrease Outcome: Progressing   Problem: Nutrition: Goal: Adequate nutrition will be maintained Outcome: Progressing   Problem: Pain Managment: Goal: General experience of comfort will improve Outcome: Progressing   Problem: Safety: Goal: Ability to remain free from injury will improve Outcome: Progressing   Problem: Skin Integrity: Goal: Risk for impaired skin integrity will decrease Outcome: Progressing   Problem: Cardiac: Goal: Ability to achieve and maintain adequate cardiopulmonary perfusion will improve Outcome: Progressing

## 2022-07-24 NOTE — Progress Notes (Signed)
Progress Note  Patient Name: Allen Cortez Date of Encounter: 07/24/2022  Primary Cardiologist: Shelva Majestic, MD  Subjective   No acute events overnight.  Patient denied any chest pain or DOE after LCx PCI. No bleeding complications in the last 24 hours.  Inpatient Medications    Scheduled Meds:  aspirin EC  81 mg Oral Daily   atorvastatin  40 mg Oral QHS   carvedilol  6.25 mg Oral BID WC   Chlorhexidine Gluconate Cloth  6 each Topical Q0600   citalopram  20 mg Oral QHS   clonazePAM  0.5 mg Oral QHS   clopidogrel  75 mg Oral Q breakfast   darbepoetin (ARANESP) injection - DIALYSIS  150 mcg Intravenous Q Sat-HD   feeding supplement (NEPRO CARB STEADY)  237 mL Oral TID BM   guaiFENesin  600 mg Oral BID   hydrALAZINE  25 mg Oral BID   insulin aspart  0-6 Units Subcutaneous TID AC & HS   isosorbide dinitrate  20 mg Oral BID   multivitamin  1 tablet Oral QHS   pantoprazole (PROTONIX) IV  40 mg Intravenous Q12H   polyethylene glycol  17 g Oral Daily   QUEtiapine  300 mg Oral QHS   sodium chloride flush  3 mL Intravenous Q12H   Warfarin - Pharmacist Dosing Inpatient   Does not apply q1600   Continuous Infusions:  sodium chloride     ferric gluconate (FERRLECIT) IVPB 135 mL/hr at 07/23/22 1622   heparin 1,300 Units/hr (07/24/22 0600)   promethazine (PHENERGAN) injection (IM or IVPB) Stopped (07/23/22 1026)   PRN Meds: sodium chloride, acetaminophen **OR** acetaminophen, albuterol, alum & mag hydroxide-simeth, bisacodyl, HYDROmorphone (DILAUDID) injection, ondansetron, mouth rinse, promethazine (PHENERGAN) injection (IM or IVPB), sodium chloride flush   Vital Signs    Vitals:   07/23/22 1612 07/23/22 1945 07/24/22 0044 07/24/22 0515  BP: 116/82 113/78 120/77 115/69  Pulse: (!) 103 94 100 (!) 106  Resp: '17 19 20 19  '$ Temp: 97.8 F (36.6 C) 98.1 F (36.7 C) 97.8 F (36.6 C) 98.1 F (36.7 C)  TempSrc: Oral Oral Oral Oral  SpO2: 98% 95% 97% 94%  Weight:       Height:        Intake/Output Summary (Last 24 hours) at 07/24/2022 0813 Last data filed at 07/24/2022 0600 Gross per 24 hour  Intake 583.22 ml  Output 200 ml  Net 383.22 ml   Filed Weights   07/21/22 1215 07/23/22 0744 07/23/22 1217  Weight: 64.6 kg 63.3 kg 64.2 kg    Telemetry     Personally reviewed.  NSR, HR normal  ECG    An ECG dated 07/19/2022 was personally reviewed today and demonstrated:  NSR and Q waves in anterior leads  Physical Exam   GEN: No acute distress.   Neck: No JVD. Cardiac: RRR, no murmur, rub, or gallop.  Respiratory: Nonlabored. Clear to auscultation bilaterally. GI: Soft, nontender, bowel sounds present. MS: No edema; No deformity. Neuro:  Nonfocal. Psych: Alert and oriented x 3. Normal affect.  Labs    Chemistry Recent Labs  Lab 07/18/22 1653 07/18/22 2045 07/18/22 2046 07/20/22 0948 07/21/22 0144 07/22/22 0541 07/23/22 0223  NA 137  --    < > 135 135 135 137  K 4.1  --    < > 4.3 3.9 3.7 4.7  CL 97*  --   --  97* 95* 95* 91*  CO2 28  --   --  29 28  28 27  GLUCOSE 130*  --   --  104* 114* 94 125*  BUN 51*  --   --  20 33* 20 40*  CREATININE 6.05*  --   --  3.83* 4.94* 3.97* 5.53*  CALCIUM 8.6*  --   --  8.5* 8.4* 8.4* 8.7*  PROT 6.2* 6.3*  --   --   --   --   --   ALBUMIN 2.7* 2.7*  --  2.6*  --   --   --   AST 18 19  --   --   --   --   --   ALT 11 11  --   --   --   --   --   ALKPHOS 68 59  --   --   --   --   --   BILITOT 0.5 0.7  --   --   --   --   --   GFRNONAA 9*  --   --  16* 12* 15* 10*  ANIONGAP 12  --   --  '9 12 12 '$ 19*   < > = values in this interval not displayed.     Hematology Recent Labs  Lab 07/22/22 1457 07/22/22 2233 07/23/22 0223 07/24/22 0145  WBC 7.2  --  11.3* 14.8*  RBC 2.46*  --  2.75* 2.25*  HGB 7.8* 8.7* 8.8* 7.2*  HCT 24.6* 26.7* 26.5* 21.9*  MCV 100.0  --  96.4 97.3  MCH 31.7  --  32.0 32.0  MCHC 31.7  --  33.2 32.9  RDW 17.3*  --  17.6* 17.5*  PLT 212  --  173 154    Cardiac  Enzymes Recent Labs  Lab 07/18/22 1653 07/18/22 1845 07/18/22 2045 07/19/22 0454  TROPONINIHS 266* 271* 256* 288*    BNP Recent Labs  Lab 07/18/22 1653  BNP 2,398.5*     DDimerNo results for input(s): "DDIMER" in the last 168 hours.   Radiology    CARDIAC CATHETERIZATION  Result Date: 07/22/2022   Prox LAD to Mid LAD lesion is 100% stenosed.   Dist Cx lesion is 80% stenosed.   A drug-eluting stent was successfully placed using a SYNERGY XD 3.0X20.   Post intervention, there is a 0% residual stenosis. Successful PCI of the mid LCx with DES x 1 Plan: DAPT with ASA for one month, plavix for 12 months. OK to resume Coumadin tomorrow if no bleeding complications.    Cardiac Studies   Cardiac MRI on 07/15/22 IMPRESSION: 1. Subendocardial LGE consistent with prior infarct in mid anterior/anteroseptal, apical anterior/septal/inferior/lateral walls, and apex. LGE is greater than 50% transmural suggesting nonviability in apical anterior/septal/inferior/lateral walls and apex. LGE <50% transmural in mid anterior wall but wall thickness <4.81m suggests nonviability. Findings are consistent with nonviability in mid to distal LAD territory 2. Severe LV dilatation with severe systolic dysfunction (EF 209%. Thinning/akinesis of mid to apical anterior/anteroseptal walls and apical inferior/lateral walls. Aneurysm of apex 3.  LV apical thrombus measures 1340mx 40m40m.  Normal RV size and systolic function (EF 48%73%. Diffuse pulmonary opacities and bilateral pleural effusions (moderate on right, small on left), consider CT chest for further Evaluation.   Echo in 06/2022  1. 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  Left ventricular ejection fraction, by estimation, is 30-35%. The left  ventricle is abnormal. The left  ventricle has focal regional wall motion abnormalities. The left   ventricular internal cavity size was mildly dilated. There is akinesis of  the left ventricular, apical septal wall, inferior wall, anterior wall and  lateral wall. There is akinesis of the  left ventricular, entire apical segment. There is severe hypokinesis of  the left ventricular, basal-mid anterior wall. A falst tendon is present  in the mid LV cavity.  Assessment & Plan    Patient is a 71 year old M known to have CAD status post two-vessel disease (LAD CTO and LCx 90%) with LVEF 30 to 35%, PAF s/p repair in 2013, ESRD, HTN, HLD, type 2 diabetes, RCC s/p partial left nephrectomy in 2015, OSA has disease stage III symptoms, had a multidisciplinary team meeting and underwent LCx PCI in the current admission complicated by coffee-ground emesis.  #CAD with two-vessel disease, CTO of LAD and circumflex 90% disease with LVEF of 30 to 35%, currently angina free #Anemia, unknown etiology -Continue aspirin 81 mg once daily, Plavix 75 mg daily. However patient has 7.2 hemoglobin this morning without any active bleeding in the last 24 hours. I will repeat his hemoglobin. If the repeat value is less than 8, we will hold heparin and warfarin until GI clearance, transfuse 1 unit PRBC but will continue aspirin 81 mg and Plavix 75 mg once daily. -Continue high intensity statin, atorvastatin 40 mg nightly  #Ischemic cardiomyopathy with LVEF 30 to 35% and RWMA Plan -Continue carvedilol 6.  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  Due to no bleeding events in the next 24 hours, heparin drip and Coumadin was initiated on 07/23/2022. GI  was consulted and recommended the same to continue anticoagulation. However patient has 7.2 hemoglobin this morning without any active bleeding in the last 24 hours. I will repeat his hemoglobin. If the repeat value is less than 8, we will hold heparin and warfarin until GI clearance, transfuse 1 unit PRBC but will continue aspirin 81 mg and Plavix 75 mg once daily.  I have spent a total of 40 minutes with patient reviewing chart , telemetry, EKGs, labs and examining patient as well as establishing an assessment and plan that was discussed with the patient.  > 50% of time was spent in direct patient care.    Signed, Chalmers Guest, MD  07/24/2022, 8:13 AM

## 2022-07-24 NOTE — Progress Notes (Signed)
OT Cancellation Note  Patient Details Name: Allen Cortez MRN: [id number] DOB: 10-02-1951   Cancelled Treatment:    Reason Eval/Treat Not Completed: Medical issues which prohibited therapy (MD asking therapy hold, pt wtih 7.2 Hgb. Will follow up as appropriate)  Lynnda Child, OTD, OTR/L Acute Rehab [phone] - [phone]    Kaylyn Lim 07/24/2022, 10:13 AM

## 2022-07-24 NOTE — Progress Notes (Signed)
Progress Note   Patient: Allen Cortez YQI:[id number] DOB: July 01, 1951 DOA: 07/18/2022     6 DOS: the patient was seen and examined on 07/24/2022   Brief hospital course: Allen Cortez was admitted to the hospital with volume overload in the setting of ESRD, complicated with coronary artery disease. Hospitalization complicated with upper GI bleed.   71 yo male with the past medical history of CAD, systolic heart failure, LV thrombus, right renal cancer sp partial left nephrectomy, T2DM, ESRD and chronic hypoxemic respiratory failure who presented with dyspnea. At home he had progressive dyspnea and orthopnea despite renal replacement therapy with ultrafiltration on T-TH-Sat schedule. On his initial physical examination his blood pressure was 148/94, HT 94, lungs with no wheezing, heart with S1 and S2 present and rhythmic, abdomen with no distention, no lower extremity edema.   Na 137, K 4,1 CL 97 bicarbonate 28 glucose 130 bun 51 cr 6,0  BNP 2,398 High sensitive troponin 266, 271  Wbc 7,8 hgb 6,6 plt Rhine 19 negative   Chest radiograph with cardiomegaly, bilateral interstitial infiltrates, symmetric, bilateral small pleural effusions, right IJ vein HD catheter in place.   EKG 85 bpm, left axis deviation, normal intervals, sinus rhythm with poor R to R wave progression, with no significant ST segment or T wave changes, positive LVH.   10/17 Patient had urgent HD with ultrafiltration  10/19 HD. Plan for multidisciplinary cardiovascular meeting tomorrow to decide if patient will benefit more from PCI than CABG.  10/20 PCI  Coffee ground emesis and abdominal pain post procedure.  1 unit PRBC transfusion.  10/21 GI consulted with recommendations of IV pantoprazole, high risk for endoscopic procedure.  10/22 PRBC transfusion due to low hgb below 8.   Assessment and Plan: Acute kidney injury (AKI) with acute tubular necrosis (ATN) (HCC) ESRD on HD.  Patient with improving volume  status  Plan to continue ultrafiltration and hemodialysis per nephrology recommendations.  Acute on chronic hypoxemic respiratory failure due to acute pulmonary edema Oxygenation has improved 02 saturation today 100% on 3 L/min per [place].   Anemia of chronic renal disease with iron deficiency  Continue with EPO and iron.   Acute on chronic systolic congestive heart failure (HCC)  Echocardiogram with reduced LV systolic function EF 30 to 35%, akinesis of the LV apical segment wall, inferior wall, anterior wall and lateral wall. Akinesis of the entire apical segment. Severe hypokinesis of the left ventricle, basal mid anterior wall. Preserved RV systolic function. RVSP 45.5 mmHg.   Continue with carvedilol After load reduction with hydralazine and isosorbide.    LV thrombus, heparin has been stopped, INR is 1,3. Anticoagulation with heparin and warfarin when [facility] per GI.     Acute on chronic systolic CHF (congestive heart failure) (HCC)-resolved as of 07/21/2022  Echocardiogram with reduced LV systolic function EF 30 to 35%, akinesis of the LV apical segment wall, inferior wall, anterior wall and lateral wall. Akinesis of the entire apical segment. Severe hypokinesis of the left ventricle, basal mid anterior wall. Preserved RV systolic function. RVSP 45.5 mmHg.  Systolic blood pressure  387 to 130 mmHg.   Continue close blood pressure monitoring.  Continue with carvedilol After load reduction with hydralazine and isosorbide.   Continue heparin for LV thrombus, transition to warfarin after completing cardiac workup.   Coronary artery disease involving native coronary artery of native heart with angina pectoris ([place]): 100% mLAD, 80% m(AVG)LCx Cardiac catheterization with 2 vessel disease, LAD 80%, Lcx.  Continue medical therapy with aspirin/ clopidogrel and B blockade. Statin therapy.  Cardiac MRI with mid anterior/ anteroseptal, apical, septal, inferior, lateral walls and apex with LGE  consistent with prior infarct. LGE greater than 50%, suggesting nonviability in apical anterior, septal, inferior and lateral walls and apex.  Nonviability in mid to distal LAD territory    Catheterization Prox LAD to Mid LAD 100% stenosed  Distal Cx lesion 80% stenosed  Patient had a successful drug eluding stent placed, to the mid LCx, Recommendations to continue warfarin, aspirin for 1 month and clopidogrel for 6 months.   Patient needs to continue with dual antiplatelet therapy despite upper GI bleed due to high risk of stent thrombosis.   Coronary atherosclerosis of native coronary artery-resolved as of 07/21/2022 Cardiac catheterization with 2 vessel disease, LAD 80%, Lcx.   Continue medical therapy with aspirin and B blockade. Statin therapy.  Cardiac MRI with mid anterior/ anteroseptal, apical, septal, inferior, lateral walls and apex with LGE consistent with prior infarct. LGE greater than 50%, suggesting nonviability in apical anterior, septal, inferior and lateral walls and apex.  Nonviability in mid to distal LAD territory   Patient likely not candidate for surgery revascularization CABG. Follow up with cardiology recommendations.  Continue medical therapy with aspirin and statin.   Type 2 diabetes mellitus with hyperlipidemia (HCC) Continue insulin sliding scale for glucose cover and monitoring Patient is tolerating po well.   GERD (gastroesophageal reflux disease) Continue with antiacid therapy.   PAD (peripheral artery disease) ([place]) Continue with statin therapy.   Upper GI bleed Patient with coffee ground emesis Requires one unit PRBC for acute blood loss anemia  Follow up Hgb is 7.2  Plan to add 1 unit PRBC, total since admission 2 units. Contin with IV pantoprazole  Follow up on Hgb closely.         Subjective: patient with cough but not dyspnea or edema, no further hematemesis   Physical Exam: Vitals:   07/24/22 0515 07/24/22 0823 07/24/22 1038  07/24/22 1103  BP: 115/69 105/74 108/71 109/74  Pulse: (!) 106 (!) 108 100 97  Resp: '19  18 16  '$ Temp: 98.1 F (36.7 C)  97.7 F (36.5 C) 97.7 F (36.5 C)  TempSrc: Oral  Oral Oral  SpO2: 94% 93% 94% 96%  Weight:      Height:       Neurology awake and alert ENT with mild pallor Cardiovascular with S1 and S2 present and rhythmic with no gallops or rubs Respiratory with no rales or wheezing Abdomen with no distention, mild tender to deep palpation, no guarding or rebound No lower extremity edema  Data Reviewed:    Family Communication: I spoke with patient's son at the bedside, we talked in detail about patient's condition, plan of care and prognosis and all questions were addressed.   Disposition: Status is: Inpatient Remains inpatient appropriate because: upper gi bleed   Planned Discharge Destination: Home    Author: Tawni Millers, MD 07/24/2022 1:26 PM  For on call review [email or web address].

## 2022-07-24 NOTE — Progress Notes (Addendum)
[facility] Progress Note   Subjective:    Seen and examined patient at bedside. Noted no fluid was removed during yesterday's HD d/t severe cramping. He reports his breathing has improved. Denies CP and N/V. Noted Hgb 7.2 this morning. Reviewed Cardiology note: plan to re-check Hgb. If re-check <8, will hold Heparin and Coumadin until GI clearance. May receive blood transfusion today. Next HD 10/24.  Objective Vitals:   07/23/22 1945 07/24/22 0044 07/24/22 0515 07/24/22 0823  BP: 113/78 120/77 115/69 105/74  Pulse: 94 100 (!) 106 (!) 108  Resp: '19 20 19   '$ Temp: 98.1 F (36.7 C) 97.8 F (36.6 C) 98.1 F (36.7 C)   TempSrc: Oral Oral Oral   SpO2: 95% 97% 94% 93%  Weight:      Height:       Physical Exam General:chronically ill appearing male in NAD Heart:RRR, no mrg Lungs:CTAB, nml WOB on 2l O2 via [place] Abdomen:soft, NTND Extremities:no LE edema Dialysis Access: [facility]   Filed Weights   07/21/22 1215 07/23/22 0744 07/23/22 1217  Weight: 64.6 kg 63.3 kg 64.2 kg    Intake/Output Summary (Last 24 hours) at 07/24/2022 1008 Last data filed at 07/24/2022 0600 Gross per 24 hour  Intake 583.22 ml  Output 200 ml  Net 383.22 ml    Additional Objective Labs: Basic Metabolic Panel: Recent Labs  Lab 07/19/22 0454 07/20/22 0948 07/21/22 0144 07/22/22 0541 07/23/22 0223  NA  --  135 135 135 137  K  --  4.3 3.9 3.7 4.7  CL  --  97* 95* 95* 91*  CO2  --  '29 28 28 27  '$ GLUCOSE  --  104* 114* 94 125*  BUN  --  20 33* 20 40*  CREATININE  --  3.83* 4.94* 3.97* 5.53*  CALCIUM  --  8.5* 8.4* 8.4* 8.7*  PHOS 3.2 3.4  --   --   --    Liver Function Tests: Recent Labs  Lab 07/18/22 1653 07/18/22 2045 07/20/22 0948  AST 18 19  --   ALT 11 11  --   ALKPHOS 68 59  --   BILITOT 0.5 0.7  --   PROT 6.2* 6.3*  --   ALBUMIN 2.7* 2.7* 2.6*   No results for input(s): "LIPASE", "AMYLASE" in the last 168 hours. CBC: Recent Labs  Lab 07/18/22 1653 07/18/22 2046  07/21/22 0810 07/22/22 0541 07/22/22 1457 07/22/22 2233 07/23/22 0223 07/24/22 0145  WBC 7.8   < > 6.5 10.8* 7.2  --  11.3* 14.8*  NEUTROABS 5.1  --   --   --   --   --   --   --   HGB 6.6*   < > 7.4* 7.7* 7.8* 8.7* 8.8* 7.2*  HCT 21.2*   < > 23.2* 25.2* 24.6* 26.7* 26.5* 21.9*  MCV 99.1   < > 100.9* 100.4* 100.0  --  96.4 97.3  PLT 246   < > 174 213 212  --  173 154   < > = values in this interval not displayed.   Blood Culture No results found for: "SDES", "SPECREQUEST", "CULT", "REPTSTATUS"  Cardiac Enzymes: Recent Labs  Lab 07/18/22 2045  CKTOTAL 106   CBG: Recent Labs  Lab 07/22/22 1616 07/22/22 2150 07/23/22 1611 07/23/22 2031 07/24/22 0831  GLUCAP 118* 134* 123* 134* 134*   Iron Studies: No results for input(s): "IRON", "TIBC", "TRANSFERRIN", "FERRITIN" in the last 72 hours. Lab Results  Component Value Date  INR 1.9 (H) 07/24/2022   INR 1.3 (H) 07/23/2022   INR 1.4 (H) 07/22/2022   Studies/Results: CARDIAC CATHETERIZATION  Result Date: 07/22/2022   Prox LAD to Mid LAD lesion is 100% stenosed.   Dist Cx lesion is 80% stenosed.   A drug-eluting stent was successfully placed using a SYNERGY XD 3.0X20.   Post intervention, there is a 0% residual stenosis. Successful PCI of the mid LCx with DES x 1 Plan: DAPT with ASA for one month, plavix for 12 months. OK to resume Coumadin tomorrow if no bleeding complications.    Medications:  sodium chloride     ferric gluconate (FERRLECIT) IVPB 135 mL/hr at 07/23/22 1622   heparin 1,300 Units/hr (07/24/22 0600)   promethazine (PHENERGAN) injection (IM or IVPB) Stopped (07/23/22 1026)    aspirin EC  81 mg Oral Daily   atorvastatin  40 mg Oral QHS   carvedilol  6.25 mg Oral BID WC   Chlorhexidine Gluconate Cloth  6 each Topical Q0600   citalopram  20 mg Oral QHS   clonazePAM  0.5 mg Oral QHS   clopidogrel  75 mg Oral Q breakfast   darbepoetin (ARANESP) injection - DIALYSIS  150 mcg Intravenous Q Sat-HD   feeding  supplement (NEPRO CARB STEADY)  237 mL Oral TID BM   guaiFENesin  600 mg Oral BID   hydrALAZINE  25 mg Oral BID   insulin aspart  0-6 Units Subcutaneous TID AC & HS   isosorbide dinitrate  20 mg Oral BID   multivitamin  1 tablet Oral QHS   pantoprazole (PROTONIX) IV  40 mg Intravenous Q12H   polyethylene glycol  17 g Oral Daily   QUEtiapine  300 mg Oral QHS   sodium chloride flush  3 mL Intravenous Q12H   Warfarin - Pharmacist Dosing Inpatient   Does not apply q1600    Dialysis Orders: [facility] 4hr TTS EDW presumably 70kg 3/2.25, NO UFP Heparin 2K Units Mircera 50 q2weeks (last given 10/14)  Background: 71 y.o. 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 He is presenting with worsening shortness of breath, orthopnea, poor exercise tolerance;  he has decreased urine output  CXR showed Interval increased prominence of the interstitial markings and increased patchy densities in both lungs. Small bilateral pleural effusions.   Assessment/Plan: Acute hypoxic resp failure - likely related to volume overload. CXR w/CHF and b/l pleural effusions.  Improved, now many kg under dry weight. Will need new dry weight on d/c.  AKI on CKD 3b - baseline creatinine 1.6 prior to recent heart cath. HD yesterday-unfortunately no fluid was removed d/t severe cramping. He remains euvolemic on exam and reports his breathing has improved. Will repeat CXR. May need to run even d/t ongoing cramping and under dry weight. Next HD 10/24. Minimal to no UOP, no signs of renal recovery so far. HD dependent for 6 weeks- this likely means ESRD-  cont on TTS schedule.  Hx severe leg pain with dialysis. Order for Dilaudid 0.5 before HD and UF profile #2. At outpt HD he  actually takes Oxycodone prescribed by cardiology for the leg pain per family.  HTN - home regimen low dose hydralazine, coreg and imdur; BP in goal  w/Coreg 6.'25mg'$  BID alone now-  would not re add hydralazine. d/c home lasix d/t minimal to no UOP. CAD - sp LHC on 8/22 at [facility] as above and seen by CTS. MRI last Friday and may need CABG with total occlusion in the LAD.  He may not recover renal function and may need to proceed with CABG with the designation of ESRD.  CTS consulted - no plans for surgery at this time, high risk.  s/p DES to circ on 90/24 A/C systolic CHF - echo with EF 30-35%.  Meds limited by kidney function.  Volume removal with HD.  Anemia-  Hgb now 7.2 s/p 1 unit pRBC. Tsat 19%, getting iron load.  ESA 150 given yesterday. Cardiology plans to re-check Hgb today and hold Heparin and Coumadin if <8 until GI clearance. He may need a blood transfusion. Awaiting results. Bones-  CCa and phos in goal.  Not on binders or VDRA.  Nutrition - Currently on heart healthy/carb modified diet.  Add fluid restrictions. Alb 2.7 - start protein supplements.  Tobie Poet, NP [facility] 07/24/2022,10:08 AM  LOS: 6 days   Patient seen and examined, agree with above note with above modifications.  Patient can tell a difference clinically after stent was placed.  I think that we do have him at his dry weight and he is 4 kg under what he was upon admission-around 64.5.  Hemoglobin appears to drop today but is on anticoagulants.  Plan per cardiology  Corliss Parish, MD 07/24/2022

## 2022-07-24 NOTE — Progress Notes (Signed)
ANTICOAGULATION CONSULT NOTE  Pharmacy Consult for heparin  Indication: LV thrombus  Allergies  Allergen Reactions   Dilaudid [Hydromorphone Hcl] Other (See Comments)    hallucinations   Mirapex [Pramipexole Dihydrochloride] Other (See Comments)    Leg pain   Prednisone Other (See Comments)    irritability   Gabapentin     Other reaction(s): Tremor    Patient Measurements: Height: '5\' 10"'$  (177.8 cm) Weight: 64.2 kg (141 lb 8.6 oz) (bed scale) IBW/kg (Calculated) : 73  Vital Signs: Temp: 97.8 F (36.6 C) (10/22 0044) Temp Source: Oral (10/22 0044) BP: 120/77 (10/22 0044) Pulse Rate: 100 (10/22 0044)  Labs: Recent Labs    07/21/22 2234 07/22/22 0541 07/22/22 1457 07/22/22 2233 07/23/22 0223 07/24/22 0145  HGB  --  7.7* 7.8* 8.7* 8.8* 7.2*  HCT  --  25.2* 24.6* 26.7* 26.5* 21.9*  PLT  --  213 212  --  173 154  LABPROT  --  16.7*  --   --  16.3* 21.2*  INR  --  1.4*  --   --  1.3* 1.9*  HEPARINUNFRC <0.10* 0.15*  --   --   --  0.34  CREATININE  --  3.97*  --   --  5.53*  --      Estimated Creatinine Clearance: 11 mL/min (A) (by C-G formula based on SCr of 5.53 mg/dL (H)).   Medical History: Past Medical History:  Diagnosis Date   AAA (abdominal aortic aneurysm) (HCC)    Abdominal wall mass of left lower quadrant    Basal cell carcinoma    CAD (coronary artery disease) 2011   moderate   Colon polyp    DDD (degenerative disc disease)    Depression    Diabetes mellitus type II    no meds   GERD (gastroesophageal reflux disease)    Hyperlipidemia    Neuromuscular disorder (HCC)    Osteopenia    PAD (peripheral artery disease) (HCC)    Renal cell carcinoma    Restless leg syndrome    Sleep apnea    used a cpap yr ago-does not snore or use it now.    Medications:  Medications Prior to Admission  Medication Sig Dispense Refill Last Dose   acetaminophen (TYLENOL) 325 MG tablet Take 2 tablets (650 mg total) by mouth every 4 (four) hours as needed for  headache or mild pain.   Past Month   aspirin EC 81 MG tablet Take 1 tablet (81 mg total) by mouth daily. Swallow whole. 30 tablet 3 07/18/2022   atorvastatin (LIPITOR) 40 MG tablet Take 40 mg by mouth at bedtime.   07/17/2022   B Complex-C-Zn-Folic Acid (DIALYVITE 884-ZYSA 15) 0.8 MG TABS Take 1 tablet by mouth 3 (three) times daily as needed (with meals).   Past Week   carvedilol (COREG) 6.25 MG tablet Take 1 tablet (6.25 mg total) by mouth 2 (two) times daily with a meal. 180 tablet 3 07/18/2022 at 0900   citalopram (CELEXA) 40 MG tablet Take 40 mg by mouth every evening.   07/17/2022   clonazePAM (KLONOPIN) 0.5 MG tablet Take 0.5 mg by mouth at bedtime.   07/17/2022   furosemide (LASIX) 40 MG tablet Take 1 tablet (40 mg total) by mouth 2 (two) times daily. 180 tablet 3 07/18/2022   hydrALAZINE (APRESOLINE) 25 MG tablet Take 1 tablet (25 mg total) by mouth every 8 (eight) hours. 270 tablet 3 07/18/2022   isosorbide mononitrate (IMDUR) 30 MG 24 hr tablet  Take 0.5 tablets (15 mg total) by mouth daily. 45 tablet 3 07/18/2022   lansoprazole (PREVACID) 15 MG capsule Take 15 mg by mouth every morning.   07/18/2022   multivitamin (RENA-VIT) TABS tablet Take 1 tablet by mouth at bedtime. 30 tablet 0 07/17/2022   Nutritional Supplements (NEPRO) LIQD Take 237 mLs by mouth 2 (two) times daily.   07/18/2022   QUEtiapine (SEROQUEL) 300 MG tablet Take 300 mg by mouth at bedtime.   07/17/2022   warfarin (COUMADIN) 5 MG tablet Take 1 tablet daily Monday, Wednesday, Friday, Saturday and Sunday and take half tablet daily on Tuesday and Thursday. (Patient taking differently: Take 2.5-5 mg by mouth See admin instructions. Take 2.5 mg (1/2 tablet) by mouth daily every day of the week except for Monday's. On Monday's take 5 mg (1 tablet).) 30 tablet 0 07/17/2022 at 2000   feeding supplement (ENSURE ENLIVE / ENSURE PLUS) LIQD Take 237 mLs by mouth 2 (two) times daily between meals. (Patient not taking: Reported on  07/18/2022) 14220 mL 0 Not Taking    Assessment: 71 yo male on warfarin PTA for recent LV thrombus. He is also noted with ESRD and s/p emergent HD. S/p PCI 10/20, heparin held d/t 1 episode of coffee-ground emesis, no reports of bleeding since. Pharmacy consulted to bridge heparin to warfarin.   Patient previously subtherapeutic with heparin infusion at 1250 units/hr. CBC stable, INR 1.3  10/22 AM update:  Heparin level therapeutic after re-start Hgb 7.2 (range here has been 7.1-8.8) Warfarin re-started>>INR 1.9   Goal of Therapy:  Heparin level 0.3-0.5 units/mL  Monitor platelets by anticoagulation protocol: Yes   Plan:  Cont heparin 1300 units/hr 1100 heparin level Watch closely for bleeding  Narda Bonds, PharmD, BCPS Clinical Pharmacist Phone: [phone]

## 2022-07-24 NOTE — Progress Notes (Signed)
ANTICOAGULATION CONSULT NOTE  Pharmacy Consult for heparin  Indication: LV thrombus  Allergies  Allergen Reactions   Dilaudid [Hydromorphone Hcl] Other (See Comments)    hallucinations   Mirapex [Pramipexole Dihydrochloride] Other (See Comments)    Leg pain   Prednisone Other (See Comments)    irritability   Gabapentin     Other reaction(s): Tremor    Patient Measurements: Height: '5\' 10"'$  (177.8 cm) Weight: 64.2 kg (141 lb 8.6 oz) (bed scale) IBW/kg (Calculated) : 73  Vital Signs: Temp: 97.7 F (36.5 C) (10/22 1103) Temp Source: Oral (10/22 1103) BP: 109/74 (10/22 1103) Pulse Rate: 97 (10/22 1103)  Labs: Recent Labs    07/22/22 0541 07/22/22 1457 07/22/22 2233 07/23/22 0223 07/24/22 0145 07/24/22 1056  HGB 7.7* 7.8* 8.7* 8.8* 7.2*  --   HCT 25.2* 24.6* 26.7* 26.5* 21.9*  --   PLT 213 212  --  173 154  --   LABPROT 16.7*  --   --  16.3* 21.2*  --   INR 1.4*  --   --  1.3* 1.9*  --   HEPARINUNFRC 0.15*  --   --   --  0.34 0.32  CREATININE 3.97*  --   --  5.53*  --   --      Estimated Creatinine Clearance: 11 mL/min (A) (by C-G formula based on SCr of 5.53 mg/dL (H)).   Medical History: Past Medical History:  Diagnosis Date   AAA (abdominal aortic aneurysm) (HCC)    Abdominal wall mass of left lower quadrant    Basal cell carcinoma    CAD (coronary artery disease) 2011   moderate   Colon polyp    DDD (degenerative disc disease)    Depression    Diabetes mellitus type II    no meds   GERD (gastroesophageal reflux disease)    Hyperlipidemia    Neuromuscular disorder (HCC)    Osteopenia    PAD (peripheral artery disease) (HCC)    Renal cell carcinoma    Restless leg syndrome    Sleep apnea    used a cpap yr ago-does not snore or use it now.    Medications:  Medications Prior to Admission  Medication Sig Dispense Refill Last Dose   acetaminophen (TYLENOL) 325 MG tablet Take 2 tablets (650 mg total) by mouth every 4 (four) hours as needed for  headache or mild pain.   Past Month   aspirin EC 81 MG tablet Take 1 tablet (81 mg total) by mouth daily. Swallow whole. 30 tablet 3 07/18/2022   atorvastatin (LIPITOR) 40 MG tablet Take 40 mg by mouth at bedtime.   07/17/2022   B Complex-C-Zn-Folic Acid (DIALYVITE 621-HYQM 15) 0.8 MG TABS Take 1 tablet by mouth 3 (three) times daily as needed (with meals).   Past Week   carvedilol (COREG) 6.25 MG tablet Take 1 tablet (6.25 mg total) by mouth 2 (two) times daily with a meal. 180 tablet 3 07/18/2022 at 0900   citalopram (CELEXA) 40 MG tablet Take 40 mg by mouth every evening.   07/17/2022   clonazePAM (KLONOPIN) 0.5 MG tablet Take 0.5 mg by mouth at bedtime.   07/17/2022   furosemide (LASIX) 40 MG tablet Take 1 tablet (40 mg total) by mouth 2 (two) times daily. 180 tablet 3 07/18/2022   hydrALAZINE (APRESOLINE) 25 MG tablet Take 1 tablet (25 mg total) by mouth every 8 (eight) hours. 270 tablet 3 07/18/2022   isosorbide mononitrate (IMDUR) 30 MG 24 hr tablet  Take 0.5 tablets (15 mg total) by mouth daily. 45 tablet 3 07/18/2022   lansoprazole (PREVACID) 15 MG capsule Take 15 mg by mouth every morning.   07/18/2022   multivitamin (RENA-VIT) TABS tablet Take 1 tablet by mouth at bedtime. 30 tablet 0 07/17/2022   Nutritional Supplements (NEPRO) LIQD Take 237 mLs by mouth 2 (two) times daily.   07/18/2022   QUEtiapine (SEROQUEL) 300 MG tablet Take 300 mg by mouth at bedtime.   07/17/2022   warfarin (COUMADIN) 5 MG tablet Take 1 tablet daily Monday, Wednesday, Friday, Saturday and Sunday and take half tablet daily on Tuesday and Thursday. (Patient taking differently: Take 2.5-5 mg by mouth See admin instructions. Take 2.5 mg (1/2 tablet) by mouth daily every day of the week except for Monday's. On Monday's take 5 mg (1 tablet).) 30 tablet 0 07/17/2022 at 2000   feeding supplement (ENSURE ENLIVE / ENSURE PLUS) LIQD Take 237 mLs by mouth 2 (two) times daily between meals. (Patient not taking: Reported on  07/18/2022) 14220 mL 0 Not Taking    Assessment: 71 yo male on warfarin PTA for recent LV thrombus. He is also noted with ESRD and s/p emergent HD. S/p PCI 10/20, heparin held d/t 1 episode of coffee-ground emesis, no reports of bleeding since. Pharmacy consulted to bridge heparin to warfarin.   Heparin level therapeutic at 0.32. CBC stable, INR 1.9.  No issues with infusion or bleeding in last 24 hours noted.    Goal of Therapy:  INR 2-3 Heparin level 0.3-0.7 units/ml Monitor platelets by anticoagulation protocol: Yes   Plan:  Continue heparin 1300 units/hr Warfarin 2.5 mg x1  Daily heparin level, CBC, INR Monitor closely for s/sx of bleeding   Francena Hanly, PharmD Pharmacy Resident  07/24/2022 11:46 AM

## 2022-07-25 ENCOUNTER — Inpatient Hospital Stay (HOSPITAL_COMMUNITY): Payer: No Typology Code available for payment source

## 2022-07-25 ENCOUNTER — Encounter (HOSPITAL_COMMUNITY): Payer: Self-pay | Admitting: Cardiology

## 2022-07-25 DIAGNOSIS — I25119 Atherosclerotic heart disease of native coronary artery with unspecified angina pectoris: Secondary | ICD-10-CM | POA: Diagnosis not present

## 2022-07-25 DIAGNOSIS — I513 Intracardiac thrombosis, not elsewhere classified: Secondary | ICD-10-CM | POA: Diagnosis not present

## 2022-07-25 DIAGNOSIS — I5023 Acute on chronic systolic (congestive) heart failure: Secondary | ICD-10-CM | POA: Diagnosis not present

## 2022-07-25 DIAGNOSIS — K922 Gastrointestinal hemorrhage, unspecified: Secondary | ICD-10-CM | POA: Diagnosis not present

## 2022-07-25 DIAGNOSIS — N170 Acute kidney failure with tubular necrosis: Secondary | ICD-10-CM | POA: Diagnosis not present

## 2022-07-25 DIAGNOSIS — E1169 Type 2 diabetes mellitus with other specified complication: Secondary | ICD-10-CM | POA: Diagnosis not present

## 2022-07-25 DIAGNOSIS — Z7901 Long term (current) use of anticoagulants: Secondary | ICD-10-CM | POA: Diagnosis not present

## 2022-07-25 DIAGNOSIS — G709 Myoneural disorder, unspecified: Secondary | ICD-10-CM

## 2022-07-25 LAB — TYPE AND SCREEN
ABO/RH(D): O POS
Antibody Screen: NEGATIVE
Unit division: 0
Unit division: 0

## 2022-07-25 LAB — BPAM RBC
Blood Product Expiration Date: 202311172359
Blood Product Expiration Date: 202311192359
ISSUE DATE / TIME: 202310201653
ISSUE DATE / TIME: 202310221041
Unit Type and Rh: 5100
Unit Type and Rh: 5100

## 2022-07-25 LAB — CBC
HCT: 23.9 % — ABNORMAL LOW (ref 39.0–52.0)
Hemoglobin: 7.9 g/dL — ABNORMAL LOW (ref 13.0–17.0)
MCH: 30.9 pg (ref 26.0–34.0)
MCHC: 33.1 g/dL (ref 30.0–36.0)
MCV: 93.4 fL (ref 80.0–100.0)
Platelets: 144 10*3/uL — ABNORMAL LOW (ref 150–400)
RBC: 2.56 MIL/uL — ABNORMAL LOW (ref 4.22–5.81)
RDW: 19.9 % — ABNORMAL HIGH (ref 11.5–15.5)
WBC: 8.3 10*3/uL (ref 4.0–10.5)
nRBC: 0.2 % (ref 0.0–0.2)

## 2022-07-25 LAB — GLUCOSE, CAPILLARY
Glucose-Capillary: 134 mg/dL — ABNORMAL HIGH (ref 70–99)
Glucose-Capillary: 128 mg/dL — ABNORMAL HIGH (ref 70–99)
Glucose-Capillary: 133 mg/dL — ABNORMAL HIGH (ref 70–99)
Glucose-Capillary: 120 mg/dL — ABNORMAL HIGH (ref 70–99)
Glucose-Capillary: 115 mg/dL — ABNORMAL HIGH (ref 70–99)

## 2022-07-25 LAB — POCT ACTIVATED CLOTTING TIME: Activated Clotting Time: 173 seconds

## 2022-07-25 LAB — PROTIME-INR
INR: 2.7 — ABNORMAL HIGH (ref 0.8–1.2)
Prothrombin Time: 28.2 seconds — ABNORMAL HIGH (ref 11.4–15.2)

## 2022-07-25 MED ORDER — DOCUSATE SODIUM 100 MG PO CAPS
100.0000 mg | ORAL_CAPSULE | Freq: Two times a day (BID) | ORAL | Status: DC
  Administered 2022-07-25 – 2022-08-01 (×5): 100 mg via ORAL
  Filled 2022-07-25 (×9): qty 1

## 2022-07-25 MED ORDER — POLYETHYLENE GLYCOL 3350 17 G PO PACK
17.0000 g | PACK | Freq: Two times a day (BID) | ORAL | Status: DC
  Administered 2022-07-25 – 2022-07-26 (×2): 17 g via ORAL
  Filled 2022-07-25 (×6): qty 1

## 2022-07-25 MED ORDER — CHLORHEXIDINE GLUCONATE CLOTH 2 % EX PADS
6.0000 | MEDICATED_PAD | Freq: Every day | CUTANEOUS | Status: DC
  Administered 2022-07-26 – 2022-07-29 (×4): 6 via TOPICAL

## 2022-07-25 MED FILL — Nitroglycerin IV Soln 100 MCG/ML in D5W: INTRA_ARTERIAL | Qty: 10 | Status: AC

## 2022-07-25 NOTE — Progress Notes (Signed)
[facility] [address] [facility] Progress Note  Allen Cortez 71 y.o. [birth year]   Subjective: No bleeding and no BM overnight. Passing gas. Feels constipated. Complaining of abdominal pain. Sitting in bedside chair.  Objective: Vital signs: Vitals:   07/25/22 0600 07/25/22 0907  BP: 111/71 113/67  Pulse: (!) 104 88  Resp: (!) 26 20  Temp: 98.1 F (36.7 C) 97.9 F (36.6 C)  SpO2: 96% 99%    Physical Exam: Gen: lethargic, chronically ill-appearing, elderly, thin, no acute distress  HEENT: anicteric sclera CV: RRR Chest: CTA B Abd: diffusely tender with guarding, soft, nondistended, +BS Ext: no edema  Lab Results: Recent Labs    07/23/22 0223  NA 137  K 4.7  CL 91*  CO2 27  GLUCOSE 125*  BUN 40*  CREATININE 5.53*  CALCIUM 8.7*   No results for input(s): "AST", "ALT", "ALKPHOS", "BILITOT", "PROT", "ALBUMIN" in the last 72 hours. Recent Labs    07/24/22 0145 07/24/22 1701 07/25/22 0213  WBC 14.8*  --  8.3  HGB 7.2* 8.1* 7.9*  HCT 21.9* 24.8* 23.9*  MCV 97.3  --  93.4  PLT 154  --  144*      Assessment/Plan: Upper GI bleed that seems to have resolved. Hgb stable. No signs of ongoing bleeding. Multiple anticoagulants on board. Continue Protonix drip and hold off on EGD unless signs of active bleeding. Ok to resume IV Heparin if needed. Increase Miralax to BID until BM. Will follow.   Allen Cortez 07/25/2022, 10:49 AM  Questions please call [phone] ID: Allen Cortez, male   DOB: 04-25-1951, 71 y.o.   MRN: [id number]

## 2022-07-25 NOTE — Plan of Care (Signed)
  Problem: Health Behavior/Discharge Planning: Goal: Ability to identify and utilize available resources and services will improve Outcome: Progressing Goal: Ability to manage health-related needs will improve Outcome: Progressing   Problem: Clinical Measurements: Goal: Respiratory complications will improve Outcome: Progressing   Problem: Clinical Measurements: Goal: Cardiovascular complication will be avoided Outcome: Progressing   Problem: Activity: Goal: Risk for activity intolerance will decrease Outcome: Progressing   Problem: Nutrition: Goal: Adequate nutrition will be maintained Outcome: Progressing   Problem: Coping: Goal: Level of anxiety will decrease Outcome: Progressing   Problem: Pain Managment: Goal: General experience of comfort will improve Outcome: Progressing   Problem: Safety: Goal: Ability to remain free from injury will improve Outcome: Progressing   Problem: Skin Integrity: Goal: Risk for impaired skin integrity will decrease Outcome: Progressing   Problem: Cardiovascular: Goal: Vascular access site(s) Level 0-1 will be maintained Outcome: Progressing

## 2022-07-25 NOTE — Progress Notes (Signed)
Occupational Therapy Treatment Patient Details Name: Allen Cortez MRN: [id number] DOB: September 19, 1951 Today's Date: 07/25/2022   History of present illness Pt is 71 yo male admitted 07/18/22 with SOB and LE edema. Workup for significant volume overload secondary to CHF, ESRD. S/p urgent HD with ultrafiltration 10/16. PMH includes ESRD (HD TTS), CHF, CAD, AAA repair, HTN, HLD, DM2, OSA, GERD, renal cell carcinoma with partial L nephrectomy, CKD3, abdominal wall mass, depression.   OT comments  Allen Cortez was seen to progress towards acute goals. He required increased assist for bed mobility, transfers and balance this date. Likely due to general debility and malaise. Overall he needed min A for bed mobility and close min G for transfers with RW. Pt donned socks EOB with min A for balance, and groomed in sitting for energy conservation, and pt required vc cues fro initiation and sequencing of tasks. VSS on 3L. He continues to benefit from OT acutely. POC remains appropriate.   Recommendations for follow up therapy are one component of a multi-disciplinary discharge planning process, led by the attending physician.  Recommendations may be updated based on patient status, additional functional criteria and insurance authorization.    Follow Up Recommendations  Home health OT    Assistance Recommended at Discharge Frequent or constant Supervision/Assistance  Patient can return home with the following  A little help with walking and/or transfers;A little help with bathing/dressing/bathroom;Assistance with cooking/housework;Direct supervision/assist for medications management;Direct supervision/assist for financial management;Assist for transportation;Help with stairs or ramp for entrance   Equipment Recommendations  Other (comment);BSC/3in1       Precautions / Restrictions Precautions Precautions: Fall;Other (comment) Precaution Comments: BLEs cramp requiring quick need to sit; wears 3L O2  baseline Restrictions Weight Bearing Restrictions: No       Mobility Bed Mobility Overal bed mobility: Needs Assistance Bed Mobility: Supine to Sit     Supine to sit: Min assist     General bed mobility comments: for trunk elevation    Transfers Overall transfer level: Needs assistance Equipment used: Rolling walker (2 wheels) Transfers: Sit to/from Stand Sit to Stand: Min guard           General transfer comment: close min G     Balance Overall balance assessment: Needs assistance Sitting-balance support: Feet supported Sitting balance-Leahy Scale: Fair Sitting balance - Comments: min A needed initially   Standing balance support: During functional activity, Bilateral upper extremity supported Standing balance-Leahy Scale: Poor                             ADL either performed or assessed with clinical judgement   ADL Overall ADL's : Needs assistance/impaired     Grooming: Minimal assistance;Sitting Grooming Details (indicate cue type and reason): min A for cues         Upper Body Dressing : Minimal assistance;Sitting Upper Body Dressing Details (indicate cue type and reason): sitting EOB. min A for sitting balance     Toilet Transfer: Min guard;Rolling walker (2 wheels) Toilet Transfer Details (indicate cue type and reason): cues for safety         Functional mobility during ADLs: Min guard;Rolling walker (2 wheels) General ADL Comments: increased time required for all tasks. cues needed for initiation. groomed in sitting for energy conservation    Extremity/Trunk Assessment Upper Extremity Assessment Upper Extremity Assessment: Generalized weakness   Lower Extremity Assessment Lower Extremity Assessment: Generalized weakness        Vision  Vision Assessment?: No apparent visual deficits Additional Comments: wears glasses   Perception Perception Perception: Not tested   Praxis Praxis Praxis: Not tested    Cognition  Arousal/Alertness: Awake/alert Behavior During Therapy: WFL for tasks assessed/performed Overall Cognitive Status: Impaired/Different from baseline Area of Impairment: Problem solving                             Problem Solving: Slow processing, Requires verbal cues General Comments: slowed processing noted throguhout, pt required verbal cues to initiate and sequence through ADLs              General Comments VSS on 3L    Pertinent Vitals/ Pain       Pain Assessment Pain Assessment: Faces Faces Pain Scale: Hurts a little bit Pain Location: generalized with movement Pain Descriptors / Indicators: Grimacing Pain Intervention(s): Monitored during session   Frequency  Min 2X/week        Progress Toward Goals  OT Goals(current goals can now be found in the care plan section)  Progress towards OT goals: Progressing toward goals  Acute Rehab OT Goals Patient Stated Goal: to get stronger OT Goal Formulation: With patient Time For Goal Achievement: 08/02/22 Potential to Achieve Goals: Good ADL Goals Pt Will Perform Grooming: with modified independence;standing Pt Will Perform Lower Body Dressing: with modified independence;sit to/from stand Pt Will Transfer to Toilet: with modified independence;ambulating Additional ADL Goal #1: pt will tolerate at least 5 minutes of standing funcitonal activity without sitting rest break Additional ADL Goal #2: Pt will indep recall at least 3 energy conservation strategies to apply to the home setting at d/c.  Plan Discharge plan remains appropriate    =   AM-PAC OT "6 Clicks" Daily Activity     Outcome Measure   Help from another person eating meals?: None Help from another person taking care of personal grooming?: A Little Help from another person toileting, which includes using toliet, bedpan, or urinal?: A Little Help from another person bathing (including washing, rinsing, drying)?: A Little Help from another person  to put on and taking off regular upper body clothing?: A Little Help from another person to put on and taking off regular lower body clothing?: A Little 6 Click Score: 19    End of Session Equipment Utilized During Treatment: Gait belt;Oxygen  OT Visit Diagnosis: Unsteadiness on feet (R26.81);Other abnormalities of gait and mobility (R26.89);Muscle weakness (generalized) (M62.81)   Activity Tolerance Patient tolerated treatment well   Patient Left in chair;with call bell/phone within reach   Nurse Communication Mobility status        Time: 8657-8469 OT Time Calculation (min): 22 min  Charges: OT General Charges $OT Visit: 1 Visit OT Treatments $Self Care/Home Management : 8-22 mins    [name] 07/25/2022, 8:59 AM

## 2022-07-25 NOTE — Progress Notes (Addendum)
Progress Note   Patient: Allen Cortez MCN:[id number] DOB: [birth year] DOA: 07/18/2022     7 DOS: the patient was seen and examined on 07/25/2022   Brief hospital course: Allen Cortez was admitted to the hospital with volume overload in the setting of ESRD, complicated with coronary artery disease. Hospitalization complicated with upper GI bleed.   71 yo male with the past medical history of CAD, systolic heart failure, LV thrombus, right renal cancer sp partial left nephrectomy, T2DM, ESRD and chronic hypoxemic respiratory failure who presented with dyspnea. At home he had progressive dyspnea and orthopnea despite renal replacement therapy with ultrafiltration on T-TH-Sat schedule. On his initial physical examination his blood pressure was 148/94, HT 94, lungs with no wheezing, heart with S1 and S2 present and rhythmic, abdomen with no distention, no lower extremity edema.   Na 137, K 4,1 CL 97 bicarbonate 28 glucose 130 bun 51 cr 6,0  BNP 2,398 High sensitive troponin 266, 271  Wbc 7,8 hgb 6,6 plt [place] 19 negative   Chest radiograph with cardiomegaly, bilateral interstitial infiltrates, symmetric, bilateral small pleural effusions, right IJ vein HD catheter in place.   EKG 85 bpm, left axis deviation, normal intervals, sinus rhythm with poor R to R wave progression, with no significant ST segment or T wave changes, positive LVH.   10/17 Patient had urgent HD with ultrafiltration  10/19 HD. Plan for multidisciplinary cardiovascular meeting tomorrow to decide if patient will benefit more from PCI than CABG.  10/20 PCI  Coffee ground emesis and abdominal pain post procedure.  1 unit PRBC transfusion.  10/21 GI consulted with recommendations of IV pantoprazole, high risk for endoscopic procedure.  10/22 PRBC transfusion due to low hgb below 8.  10/23 no signs of further bleeding, resumed anticoagulation.   Assessment and Plan: Acute kidney injury (AKI) with acute tubular  necrosis (ATN) (HCC) ESRD on HD.  Patient with improving volume status  Plan to continue ultrafiltration and hemodialysis per nephrology recommendations.  Acute on chronic hypoxemic respiratory failure due to acute pulmonary edema Oxygenation has improved 02 saturation today 100% on 3 L/min per Allen Cortez.   Anemia of chronic renal disease with iron deficiency  Continue with EPO and iron.   Acute on chronic systolic congestive heart failure (HCC)  Echocardiogram with reduced LV systolic function EF 30 to 35%, akinesis of the LV apical segment wall, inferior wall, anterior wall and lateral wall. Akinesis of the entire apical segment. Severe hypokinesis of the left ventricle, basal mid anterior wall. Preserved RV systolic function. RVSP 45.5 mmHg.   Continue with carvedilol After load reduction with hydralazine and isosorbide.    LV thrombus, heparin has been stopped, INR is 2.7  Continue anticoagulation with warfarin.     Acute on chronic systolic CHF (congestive heart failure) (HCC)-resolved as of 07/21/2022  Echocardiogram with reduced LV systolic function EF 30 to 35%, akinesis of the LV apical segment wall, inferior wall, anterior wall and lateral wall. Akinesis of the entire apical segment. Severe hypokinesis of the left ventricle, basal mid anterior wall. Preserved RV systolic function. RVSP 45.5 mmHg.  Systolic blood pressure  629 to 130 mmHg.   Continue close blood pressure monitoring.  Continue with carvedilol After load reduction with hydralazine and isosorbide.   Continue heparin for LV thrombus, transition to warfarin after completing cardiac workup.   Coronary artery disease involving native coronary artery of native heart with angina pectoris ([place]): 100% mLAD, 80% m(AVG)LCx Cardiac catheterization with 2 vessel  disease, LAD 80%, Lcx.    Continue medical therapy with aspirin/ clopidogrel and B blockade. Statin therapy.  Cardiac MRI with mid anterior/ anteroseptal, apical,  septal, inferior, lateral walls and apex with LGE consistent with prior infarct. LGE greater than 50%, suggesting nonviability in apical anterior, septal, inferior and lateral walls and apex.  Nonviability in mid to distal LAD territory    Catheterization Prox LAD to Mid LAD 100% stenosed  Distal Cx lesion 80% stenosed  Patient had a successful drug eluding stent placed, to the mid LCx, Recommendations to continue warfarin, aspirin for 1 month and clopidogrel for 6 months.   Patient needs to continue with dual antiplatelet therapy despite upper GI bleed due to high risk of stent thrombosis.   Coronary atherosclerosis of native coronary artery-resolved as of 07/21/2022 Cardiac catheterization with 2 vessel disease, LAD 80%, Lcx.   Continue medical therapy with aspirin and B blockade. Statin therapy.  Cardiac MRI with mid anterior/ anteroseptal, apical, septal, inferior, lateral walls and apex with LGE consistent with prior infarct. LGE greater than 50%, suggesting nonviability in apical anterior, septal, inferior and lateral walls and apex.  Nonviability in mid to distal LAD territory   Patient likely not candidate for surgery revascularization CABG. Follow up with cardiology recommendations.  Continue medical therapy with aspirin and statin.   Type 2 diabetes mellitus with hyperlipidemia (HCC) Continue insulin sliding scale for glucose cover and monitoring Patient is tolerating po well.   GERD (gastroesophageal reflux disease) Continue with antiacid therapy.   PAD (peripheral artery disease) ([place]) Continue with statin therapy.   Upper GI bleed Patient with coffee ground emesis Sp 2 units PRBC transfusion   Follow up Hgb is 7,9 Will continue with pantoprazole IV drip for 72 days then transition to oral pantoprazole bid.  Follow up Hgb in am.         Subjective: patient with no chest pain, no dyspnea, continue very weak and deconditioned, no further hematemesis,    Physical Exam: Vitals:   07/24/22 1959 07/25/22 0341 07/25/22 0600 07/25/22 0907  BP: 114/75 124/85 111/71 113/67  Pulse: 98 (!) 105 (!) 104 88  Resp: 16 19 (!) 26 20  Temp: 99.5 F (37.5 C) 98 F (36.7 C) 98.1 F (36.7 C) 97.9 F (36.6 C)  TempSrc: Oral Oral Oral Oral  SpO2: 96% 92% 96% 99%  Weight:      Height:       Neurology awake and alert ENT with mild pallor Cardiovascular with S1 and S2 present and rhythmic with no gallops Respiratory with no rales or wheezing Abdomen with no distention  No lower extremity edema  Data Reviewed:    Family Communication: no family at the bedside   Disposition: Status is: Inpatient Remains inpatient appropriate because: upper gi bleeding   Planned Discharge Destination: Home   Author: Tawni Millers, MD 07/25/2022 2:11 PM  For on call review [email or web address].

## 2022-07-25 NOTE — Progress Notes (Addendum)
Pt walked with PT recently, now in bed sleeping. Sts he is very fatigued and declines walking later. Discussed with pt Plavix importance, following RD at HD's guidelines, and increasing mobility. Pt nodded but also sts it is painful to ambulate. Will place referral for CRPII ([place]) to meet guidelines. Will f/u tomorrow. [place], ACSM-CEP 07/25/2022 1:45 PM

## 2022-07-25 NOTE — Progress Notes (Signed)
ANTICOAGULATION CONSULT NOTE  Pharmacy Consult for Coumadin Indication: LV thrombus  Allergies  Allergen Reactions   Dilaudid [Hydromorphone Hcl] Other (See Comments)    hallucinations   Mirapex [Pramipexole Dihydrochloride] Other (See Comments)    Leg pain   Prednisone Other (See Comments)    irritability   Gabapentin     Other reaction(s): Tremor    Patient Measurements: Height: '5\' 10"'$  (177.8 cm) Weight: 64.2 kg (141 lb 8.6 oz) (bed scale) IBW/kg (Calculated) : 73  Vital Signs: Temp: 97.9 F (36.6 C) (10/23 0907) Temp Source: Oral (10/23 0907) BP: 113/67 (10/23 0907) Pulse Rate: 88 (10/23 0907)  Labs: Recent Labs    07/23/22 0223 07/24/22 0145 07/24/22 1056 07/24/22 1701 07/25/22 0213 07/25/22 0729  HGB 8.8* 7.2*  --  8.1* 7.9*  --   HCT 26.5* 21.9*  --  24.8* 23.9*  --   PLT 173 154  --   --  144*  --   LABPROT 16.3* 21.2*  --   --   --  28.2*  INR 1.3* 1.9*  --   --   --  2.7*  HEPARINUNFRC  --  0.34 0.32  --   --   --   CREATININE 5.53*  --   --   --   --   --      Estimated Creatinine Clearance: 11 mL/min (A) (by C-G formula based on SCr of 5.53 mg/dL (H)).   Medical History: Past Medical History:  Diagnosis Date   AAA (abdominal aortic aneurysm) (HCC)    Abdominal wall mass of left lower quadrant    Basal cell carcinoma    CAD (coronary artery disease) 2011   moderate   Colon polyp    DDD (degenerative disc disease)    Depression    Diabetes mellitus type II    no meds   GERD (gastroesophageal reflux disease)    Hyperlipidemia    Neuromuscular disorder (HCC)    Osteopenia    PAD (peripheral artery disease) (HCC)    Renal cell carcinoma    Restless leg syndrome    Sleep apnea    used a cpap yr ago-does not snore or use it now.    Medications:  Medications Prior to Admission  Medication Sig Dispense Refill Last Dose   acetaminophen (TYLENOL) 325 MG tablet Take 2 tablets (650 mg total) by mouth every 4 (four) hours as needed for  headache or mild pain.   Past Month   aspirin EC 81 MG tablet Take 1 tablet (81 mg total) by mouth daily. Swallow whole. 30 tablet 3 07/18/2022   atorvastatin (LIPITOR) 40 MG tablet Take 40 mg by mouth at bedtime.   07/17/2022   B Complex-C-Zn-Folic Acid (DIALYVITE 937-JIRC 15) 0.8 MG TABS Take 1 tablet by mouth 3 (three) times daily as needed (with meals).   Past Week   carvedilol (COREG) 6.25 MG tablet Take 1 tablet (6.25 mg total) by mouth 2 (two) times daily with a meal. 180 tablet 3 07/18/2022 at 0900   citalopram (CELEXA) 40 MG tablet Take 40 mg by mouth every evening.   07/17/2022   clonazePAM (KLONOPIN) 0.5 MG tablet Take 0.5 mg by mouth at bedtime.   07/17/2022   furosemide (LASIX) 40 MG tablet Take 1 tablet (40 mg total) by mouth 2 (two) times daily. 180 tablet 3 07/18/2022   hydrALAZINE (APRESOLINE) 25 MG tablet Take 1 tablet (25 mg total) by mouth every 8 (eight) hours. 270 tablet 3 07/18/2022  isosorbide mononitrate (IMDUR) 30 MG 24 hr tablet Take 0.5 tablets (15 mg total) by mouth daily. 45 tablet 3 07/18/2022   lansoprazole (PREVACID) 15 MG capsule Take 15 mg by mouth every morning.   07/18/2022   multivitamin (RENA-VIT) TABS tablet Take 1 tablet by mouth at bedtime. 30 tablet 0 07/17/2022   Nutritional Supplements (NEPRO) LIQD Take 237 mLs by mouth 2 (two) times daily.   07/18/2022   QUEtiapine (SEROQUEL) 300 MG tablet Take 300 mg by mouth at bedtime.   07/17/2022   warfarin (COUMADIN) 5 MG tablet Take 1 tablet daily Monday, Wednesday, Friday, Saturday and Sunday and take half tablet daily on Tuesday and Thursday. (Patient taking differently: Take 2.5-5 mg by mouth See admin instructions. Take 2.5 mg (1/2 tablet) by mouth daily every day of the week except for Monday's. On Monday's take 5 mg (1 tablet).) 30 tablet 0 07/17/2022 at 2000   feeding supplement (ENSURE ENLIVE / ENSURE PLUS) LIQD Take 237 mLs by mouth 2 (two) times daily between meals. (Patient not taking: Reported on  07/18/2022) 14220 mL 0 Not Taking    Assessment: 71 yo male on warfarin PTA for recent LV thrombus. He is also noted with ESRD and s/p emergent HD. S/p PCI 10/20, heparin held d/t 1 episode of coffee-ground emesis, no reports of bleeding since. Pharmacy consulted to bridge heparin to warfarin.   Heparin off since yesterday. CBC stable, INR 1.9 > 2.7.  No issues with infusion or bleeding in last 24 hours noted.   Pharmacy asked to resume Coumadin today.   Goal of Therapy:  INR 2-3 Heparin level 0.3-0.7 units/ml Monitor platelets by anticoagulation protocol: Yes   Plan:  Given quick jump in INR today, will not give Coumadin dose tonight. Hopefully INR will trend down a bit tomorrow and Coumadin can be resumed.  Likely needs very low dose Coumadin.  [place] Crane, Roylene Reason, BCCP Clinical Pharmacist  07/25/2022 2:08 PM   [facility] pharmacy phone numbers are listed on [email or web address]

## 2022-07-25 NOTE — Progress Notes (Signed)
Physical Therapy Treatment Patient Details Name: Allen Cortez MRN: [id number] DOB: 10/19/1950 Today's Date: 07/25/2022   History of Present Illness Pt is 71 yo male admitted 07/18/22 with SOB and LE edema. Workup for significant volume overload secondary to CHF, ESRD. S/p urgent HD with ultrafiltration 10/16. PMH includes ESRD (HD TTS), CHF, CAD, AAA repair, HTN, HLD, DM2, OSA, GERD, renal cell carcinoma with partial L nephrectomy, CKD3, abdominal wall mass, depression.    PT Comments    Pt is continuing therapy today, with a bit shorter set of walks to sit and rest, but is in less obvious pain with legs.  Have emphasized stretching and pt seems to respond well with mobility given his recent pain issues.  Follow along with him for progression to home with assist, and will continue ex and standing/walking mm use for lengthening and normalizing his gait.   Recommendations for follow up therapy are one component of a multi-disciplinary discharge planning process, led by the attending physician.  Recommendations may be updated based on patient status, additional functional criteria and insurance authorization.  Follow Up Recommendations  Home health PT     Assistance Recommended at Discharge PRN  Patient can return home with the following Help with stairs or ramp for entrance;Assistance with cooking/housework   Equipment Recommendations  Rollator (4 wheels)    Recommendations for Other Services       Precautions / Restrictions Precautions Precautions: Fall;Other (comment) Precaution Comments: fatigue with LE's Restrictions Weight Bearing Restrictions: No RUE Weight Bearing: Weight bearing as tolerated LUE Weight Bearing: Weight bearing as tolerated RLE Weight Bearing: Weight bearing as tolerated LLE Weight Bearing: Weight bearing as tolerated     Mobility  Bed Mobility Overal bed mobility: Needs Assistance Bed Mobility: Supine to Sit, Sit to Supine     Supine to sit: Min  assist Sit to supine: Min guard, Min assist        Transfers Overall transfer level: Needs assistance Equipment used: Rollator (4 wheels) Transfers: Sit to/from Stand Sit to Stand: Min guard                Ambulation/Gait Ambulation/Gait assistance: Min guard, Min assist Gait Distance (Feet): 60 Feet (30+10+20) Assistive device: Rollator (4 wheels) Gait Pattern/deviations: Step-through pattern, Decreased stride length, Trunk flexed Gait velocity: decreased Gait velocity interpretation: <1.31 ft/sec, indicative of household ambulator   General Gait Details: requires actual cues for the seqeunce to sit and manage lines   Stairs             Wheelchair Mobility    Modified Rankin (Stroke Patients Only)       Balance Overall balance assessment: Needs assistance Sitting-balance support: Feet supported Sitting balance-Leahy Scale: Fair     Standing balance support: Bilateral upper extremity supported, During functional activity Standing balance-Leahy Scale: Poor                              Cognition Arousal/Alertness: Awake/alert Behavior During Therapy: WFL for tasks assessed/performed Overall Cognitive Status: Impaired/Different from baseline Area of Impairment: Safety/judgement, Awareness, Problem solving                         Safety/Judgement: Decreased awareness of safety Awareness: Intellectual Problem Solving: Slow processing, Requires verbal cues, Requires tactile cues General Comments: required cued sequence for walker sitting to rest due to being unsure of his needs with lines  Exercises General Exercises - Lower Extremity Ankle Circles/Pumps: AAROM, AROM, 5 reps Quad Sets: AROM, 10 reps Heel Slides: AROM, 10 reps Hip ABduction/ADduction: AAROM, 10 reps    General Comments General comments (skin integrity, edema, etc.): titrated to 4L with gait due to desat to 87%      Pertinent Vitals/Pain Pain  Assessment Pain Assessment: Faces Faces Pain Scale: Hurts a little bit Pain Location: LE Pain Descriptors / Indicators: Guarding Pain Intervention(s): Monitored during session, Repositioned    Home Living                          Prior Function            PT Goals (current goals can now be found in the care plan section) Acute Rehab PT Goals Patient Stated Goal: return home    Frequency    Min 3X/week      PT Plan Current plan remains appropriate    Co-evaluation              AM-PAC PT "6 Clicks" Mobility   Outcome Measure  Help needed turning from your back to your side while in a flat bed without using bedrails?: None Help needed moving from lying on your back to sitting on the side of a flat bed without using bedrails?: None Help needed moving to and from a bed to a chair (including a wheelchair)?: A Little Help needed standing up from a chair using your arms (e.g., wheelchair or bedside chair)?: A Little Help needed to walk in hospital room?: A Little Help needed climbing 3-5 steps with a railing? : A Lot 6 Click Score: 19    End of Session Equipment Utilized During Treatment: Oxygen;Gait belt Activity Tolerance: Patient tolerated treatment well;Patient limited by fatigue Patient left: in bed;with call bell/phone within reach;with bed alarm set Nurse Communication: Mobility status PT Visit Diagnosis: Pain;Difficulty in walking, not elsewhere classified (R26.2);Unsteadiness on feet (R26.81) Pain - Right/Left:  (both) Pain - part of body:  (B thighs)     Time: 7416-3845 PT Time Calculation (min) (ACUTE ONLY): 27 min  Charges:  $Gait Training: 8-22 mins $Therapeutic Exercise: 8-22 mins       Ramond Dial 07/25/2022, 1:12 PM  Mee Hives, PT PhD Acute Rehab Dept. Number: [place] [facility] [place]

## 2022-07-25 NOTE — Progress Notes (Addendum)
[facility] Progress Note   Subjective:   Patient seen and examined in room.  Sitting in bedside chair.  Reports abdominal pain and constipation this AM.  Just given miralax.  Stool softener ordered.  Denies CP, SOB, abdominal pain and n/v/d.    Objective Vitals:   07/24/22 1959 07/25/22 0341 07/25/22 0600 07/25/22 0907  BP: 114/75 124/85 111/71 113/67  Pulse: 98 (!) 105 (!) 104   Resp: 16 19 (!) 26   Temp: 99.5 F (37.5 C) 98 F (36.7 C) 98.1 F (36.7 C)   TempSrc: Oral Oral Oral   SpO2: 96% 92% 96%   Weight:      Height:       Physical Exam General:chronically ill appearing male in NAD Heart:RRR, no mrg Lungs:CTAB, decreased in bases, nml WOB on 3L O2 via [place] Abdomen:soft, NTND Extremities:no LE edema Dialysis Access: [facility]   Filed Weights   07/21/22 1215 07/23/22 0744 07/23/22 1217  Weight: 64.6 kg 63.3 kg 64.2 kg    Intake/Output Summary (Last 24 hours) at 07/25/2022 1020 Last data filed at 07/25/2022 0500 Gross per 24 hour  Intake 822.76 ml  Output 0 ml  Net 822.76 ml    Additional Objective Labs: Basic Metabolic Panel: Recent Labs  Lab 07/19/22 0454 07/20/22 0948 07/21/22 0144 07/22/22 0541 07/23/22 0223  NA  --  135 135 135 137  K  --  4.3 3.9 3.7 4.7  CL  --  97* 95* 95* 91*  CO2  --  '29 28 28 27  '$ GLUCOSE  --  104* 114* 94 125*  BUN  --  20 33* 20 40*  CREATININE  --  3.83* 4.94* 3.97* 5.53*  CALCIUM  --  8.5* 8.4* 8.4* 8.7*  PHOS 3.2 3.4  --   --   --    Liver Function Tests: Recent Labs  Lab 07/18/22 1653 07/18/22 2045 07/20/22 0948  AST 18 19  --   ALT 11 11  --   ALKPHOS 68 59  --   BILITOT 0.5 0.7  --   PROT 6.2* 6.3*  --   ALBUMIN 2.7* 2.7* 2.6*   No results for input(s): "LIPASE", "AMYLASE" in the last 168 hours. CBC: Recent Labs  Lab 07/18/22 1653 07/18/22 2046 07/22/22 0541 07/22/22 1457 07/22/22 2233 07/23/22 0223 07/24/22 0145 07/24/22 1701 07/25/22 0213  WBC 7.8   < > 10.8* 7.2  --  11.3* 14.8*   --  8.3  NEUTROABS 5.1  --   --   --   --   --   --   --   --   HGB 6.6*   < > 7.7* 7.8*   < > 8.8* 7.2* 8.1* 7.9*  HCT 21.2*   < > 25.2* 24.6*   < > 26.5* 21.9* 24.8* 23.9*  MCV 99.1   < > 100.4* 100.0  --  96.4 97.3  --  93.4  PLT 246   < > 213 212  --  173 154  --  144*   < > = values in this interval not displayed.   Cardiac Enzymes: Recent Labs  Lab 07/18/22 2045  CKTOTAL 106   CBG: Recent Labs  Lab 07/24/22 1123 07/24/22 1628 07/24/22 2024 07/25/22 0547 07/25/22 0832  GLUCAP 142* 134* 136* 134* 128*    Studies/Results: DG CHEST PORT 1 VIEW  Result Date: 07/25/2022 CLINICAL DATA:  Shortness of breath EXAM: PORTABLE CHEST 1 VIEW COMPARISON:  07/18/2022 FINDINGS: Central line remains at  the SVC RA junction. Cardiomegaly persists. Pulmonary edema persists, similar to the previous study. Layering pleural fluid is probably present. No new finding. IMPRESSION: No significant change. Persistent cardiomegaly and pulmonary edema. Probable layering pleural fluid. Electronically Signed   By: Nelson Chimes M.D.   On: 07/25/2022 06:53    Medications:  sodium chloride     ferric gluconate (FERRLECIT) IVPB 135 mL/hr at 07/23/22 1622   pantoprazole 8 mg/hr (07/25/22 0500)   promethazine (PHENERGAN) injection (IM or IVPB) Stopped (07/23/22 1026)    aspirin EC  81 mg Oral Daily   atorvastatin  40 mg Oral QHS   carvedilol  6.25 mg Oral BID WC   Chlorhexidine Gluconate Cloth  6 each Topical Q0600   citalopram  20 mg Oral QHS   clonazePAM  0.5 mg Oral QHS   clopidogrel  75 mg Oral Q breakfast   darbepoetin (ARANESP) injection - DIALYSIS  150 mcg Intravenous Q Sat-HD   docusate sodium  100 mg Oral BID   feeding supplement (NEPRO CARB STEADY)  237 mL Oral TID BM   guaiFENesin  600 mg Oral BID   hydrALAZINE  25 mg Oral BID   insulin aspart  0-6 Units Subcutaneous TID AC & HS   isosorbide dinitrate  20 mg Oral BID   multivitamin  1 tablet Oral QHS   polyethylene glycol  17 g Oral Daily    QUEtiapine  300 mg Oral QHS   sodium chloride flush  3 mL Intravenous Q12H    Dialysis Orders: [place] 4hr TTS EDW presumably 70kg 3/2.25, NO UFP Heparin 2K Units Mircera 50 q2weeks (last given 10/14)   Background: 71 y.o. 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 He is presenting with worsening shortness of breath, orthopnea, poor exercise tolerance;  he has decreased urine output  CXR showed Interval increased prominence of the interstitial markings and increased patchy densities in both lungs. Small bilateral pleural effusions.    Assessment/Plan: Acute hypoxic resp failure - likely related to volume overload. CXR w/CHF and b/l pleural effusions.  Improved, now under dry weight.  Repeat CXR today with persistent cardiomegaly and pulmonary edema. Probable layering pleural fluid.  UF limited by cramping, continue to titrate down as tolerated.  AKI on CKD 3b - baseline creatinine 1.6 prior to recent heart cath. - Next HD 10/24. Minimal to no UOP, no signs of renal recovery so far. HD dependent for 6 weeks- this likely means ESRD-  cont on TTS schedule.  Hx severe leg pain with dialysis.  At outpt HD he actually takes Oxycodone prescribed by cardiology for the leg pain per family.  HTN - BP in goal w/Coreg 6.'25mg'$  BID, hydralazine '25mg'$  BID, and isordil '20mg'$  BID.  d/c home lasix d/t minimal to no UOP. CAD - sp LHC on 8/22 at [facility] as above and seen by CTS. MRI last 10/13, may need CABG with total occlusion in the LAD.  He may not recover renal function and may need to proceed with CABG with the designation of ESRD.  CTS consulted - no plans for surgery at this time, high risk.  s/p DES to circ on 10/20. Needs triple AC therapy - ASA, plavix and  coumadin x  1 month, then just plavix, coumadin. Per GI ok with ASA, plavix therapy continued as long as no overt bleeding, hold coumadin if ongoing drop in Hgb, ok to restart heparin if Hgb stable.  Per cardiology A/C systolic CHF - echo with EF 30-35%.  Meds limited by kidney function.  Volume removal with HD.  Anemia-  Hgb now 7.9 s/p 1 unit pRBC yesterday, 3 units total since admission. Tsat 19%, getting iron load.  ESA 150 qSat. Bones-  CCa and phos in goal.  Not on binders or VDRA.  Nutrition - Currently on heart healthy/carb modified diet w/ fluid restrictions. Alb 2.7 - start protein supplements.  Jen Mow, PA-C [facility] 07/25/2022,10:20 AM  LOS: 7 days    Seen and examined independently.  Agree with note and exam as documented above by physician extender and as noted here.  Picked his glasses up off the floor for him.  He states he presented from home and doesn't know about his plans for discharge yet   General elderly male in bed in no acute distress HEENT normocephalic atraumatic extraocular movements intact sclera anicteric Neck supple trachea midline Lungs clear to auscultation bilaterally normal work of breathing at rest on 3 liters oxygen  Heart S1S2 no rub Abdomen soft nontender nondistended Extremities no edema  Psych normal mood and affect  ESRD - HD per TTS schedule   Anemia CKD - on ESA  CAD - as above.  Per cardiology. On medical therapy   HTN - controlled  Claudia Desanctis, MD 07/25/2022  3:22 PM

## 2022-07-25 NOTE — Progress Notes (Addendum)
Rounding Note    Patient Name: Allen Cortez Date of Encounter: 07/25/2022  [place] Cardiologist: Shelva Majestic, MD   Subjective   No chest pain, still having abd pain. No recent BM, given miralax this morning   Inpatient Medications    Scheduled Meds:  aspirin EC  81 mg Oral Daily   atorvastatin  40 mg Oral QHS   carvedilol  6.25 mg Oral BID WC   Chlorhexidine Gluconate Cloth  6 each Topical Q0600   citalopram  20 mg Oral QHS   clonazePAM  0.5 mg Oral QHS   clopidogrel  75 mg Oral Q breakfast   darbepoetin (ARANESP) injection - DIALYSIS  150 mcg Intravenous Q Sat-HD   feeding supplement (NEPRO CARB STEADY)  237 mL Oral TID BM   guaiFENesin  600 mg Oral BID   hydrALAZINE  25 mg Oral BID   insulin aspart  0-6 Units Subcutaneous TID AC & HS   isosorbide dinitrate  20 mg Oral BID   multivitamin  1 tablet Oral QHS   polyethylene glycol  17 g Oral Daily   QUEtiapine  300 mg Oral QHS   sodium chloride flush  3 mL Intravenous Q12H   Warfarin - Pharmacist Dosing Inpatient   Does not apply q1600   Continuous Infusions:  sodium chloride     ferric gluconate (FERRLECIT) IVPB 135 mL/hr at 07/23/22 1622   pantoprazole 8 mg/hr (07/25/22 0500)   promethazine (PHENERGAN) injection (IM or IVPB) Stopped (07/23/22 1026)   PRN Meds: sodium chloride, acetaminophen **OR** acetaminophen, albuterol, alum & mag hydroxide-simeth, bisacodyl, ondansetron, mouth rinse, promethazine (PHENERGAN) injection (IM or IVPB), sodium chloride flush   Vital Signs    Vitals:   07/24/22 1959 07/25/22 0341 07/25/22 0600 07/25/22 0907  BP: 114/75 124/85 111/71 113/67  Pulse: 98 (!) 105 (!) 104   Resp: 16 19 (!) 26   Temp: 99.5 F (37.5 C) 98 F (36.7 C) 98.1 F (36.7 C)   TempSrc: Oral Oral Oral   SpO2: 96% 92% 96%   Weight:      Height:        Intake/Output Summary (Last 24 hours) at 07/25/2022 0928 Last data filed at 07/25/2022 0500 Gross per 24 hour  Intake 822.76 ml   Output 0 ml  Net 822.76 ml      07/23/2022   12:17 PM 07/23/2022    7:44 AM 07/21/2022   12:15 PM  Last 3 Weights  Weight (lbs) 141 lb 8.6 oz 139 lb 8.8 oz 142 lb 6.7 oz  Weight (kg) 64.2 kg 63.3 kg 64.6 kg      Telemetry    Sinus Rhythm - Personally Reviewed  ECG    No new tracing  Physical Exam   GEN: Thin, frail older male, sitting up in the chair, No acute distress.   Neck: No JVD Cardiac: RRR, no murmurs, rubs, or gallops.  Respiratory: Clear to auscultation bilaterally. GI: Soft, nontender, + distention, + BS MS: No edema; No deformity. Neuro:  Nonfocal  Psych: Normal affect   Labs    High Sensitivity Troponin:   Recent Labs  Lab 07/18/22 1653 07/18/22 1845 07/18/22 2045 07/19/22 0454  TROPONINIHS 266* 271* 256* 288*     Chemistry Recent Labs  Lab 07/18/22 1653 07/18/22 2045 07/18/22 2046 07/20/22 0948 07/21/22 0144 07/22/22 0541 07/23/22 0223  NA 137  --    < > 135 135 135 137  K 4.1  --    < >  4.3 3.9 3.7 4.7  CL 97*  --   --  97* 95* 95* 91*  CO2 28  --   --  '29 28 28 27  '$ GLUCOSE 130*  --   --  104* 114* 94 125*  BUN 51*  --   --  20 33* 20 40*  CREATININE 6.05*  --   --  3.83* 4.94* 3.97* 5.53*  CALCIUM 8.6*  --   --  8.5* 8.4* 8.4* 8.7*  PROT 6.2* 6.3*  --   --   --   --   --   ALBUMIN 2.7* 2.7*  --  2.6*  --   --   --   AST 18 19  --   --   --   --   --   ALT 11 11  --   --   --   --   --   ALKPHOS 68 59  --   --   --   --   --   BILITOT 0.5 0.7  --   --   --   --   --   GFRNONAA 9*  --   --  16* 12* 15* 10*  ANIONGAP 12  --   --  '9 12 12 '$ 19*   < > = values in this interval not displayed.    Lipids No results for input(s): "CHOL", "TRIG", "HDL", "LABVLDL", "LDLCALC", "CHOLHDL" in the last 168 hours.  Hematology Recent Labs  Lab 07/23/22 0223 07/24/22 0145 07/24/22 1701 07/25/22 0213  WBC 11.3* 14.8*  --  8.3  RBC 2.75* 2.25*  --  2.56*  HGB 8.8* 7.2* 8.1* 7.9*  HCT 26.5* 21.9* 24.8* 23.9*  MCV 96.4 97.3  --  93.4  MCH  32.0 32.0  --  30.9  MCHC 33.2 32.9  --  33.1  RDW 17.6* 17.5*  --  19.9*  PLT 173 154  --  144*   Thyroid  Recent Labs  Lab 07/18/22 2040  TSH 1.676    BNP Recent Labs  Lab 07/18/22 1653  BNP 2,398.5*    DDimer No results for input(s): "DDIMER" in the last 168 hours.   Radiology    DG CHEST PORT 1 VIEW  Result Date: 07/25/2022 CLINICAL DATA:  Shortness of breath EXAM: PORTABLE CHEST 1 VIEW COMPARISON:  07/18/2022 FINDINGS: Central line remains at the [facility] RA junction. Cardiomegaly persists. Pulmonary edema persists, similar to the previous study. Layering pleural fluid is probably present. No new finding. IMPRESSION: No significant change. Persistent cardiomegaly and pulmonary edema. Probable layering pleural fluid. Electronically Signed   By: Nelson Chimes M.D.   On: 07/25/2022 06:53    Cardiac Studies   Cardiac MRI on 07/15/22 IMPRESSION: 1. Subendocardial LGE consistent with prior infarct in mid anterior/anteroseptal, apical anterior/septal/inferior/lateral walls, and apex. LGE is greater than 50% transmural suggesting nonviability in apical anterior/septal/inferior/lateral walls and apex. LGE <50% transmural in mid anterior wall but wall thickness <4.93m suggests nonviability. Findings are consistent with nonviability in mid to distal LAD territory 2. Severe LV dilatation with severe systolic dysfunction (EF 256%. Thinning/akinesis of mid to apical anterior/anteroseptal walls and apical inferior/lateral walls. Aneurysm of apex 3.  LV apical thrombus measures 196mx 27m32m.  Normal RV size and systolic function (EF 48%21%. Diffuse pulmonary opacities and bilateral pleural effusions (moderate on right, small on left), consider CT chest for further Evaluation.   Echo in 06/2022  1. There is a large apical aneurysm with a  mobile lucency in the apex  measuring 1.07 x 1.72cm consistent with LV thrombus by definity contrast.  Left ventricular ejection fraction, by  estimation, is 30-35%. The left  ventricle is abnormal. The left  ventricle has focal regional wall motion abnormalities. The left  ventricular internal cavity size was mildly dilated. There is akinesis of  the left ventricular, apical septal wall, inferior wall, anterior wall and  lateral wall. There is akinesis of the  left ventricular, entire apical segment. There is severe hypokinesis of  the left ventricular, basal-mid anterior wall. A falst tendon is present  in the mid LV cavity.  Cath: 07/22/22    Prox LAD to Mid LAD lesion is 100% stenosed.   Dist Cx lesion is 80% stenosed.   A drug-eluting stent was successfully placed using a SYNERGY XD 3.0X20.   Post intervention, there is a 0% residual stenosis.   Successful PCI of the mid LCx with DES x 1     Plan: DAPT with ASA for one month, plavix for 12 months. OK to resume Coumadin tomorrow if no bleeding complications.    Diagnostic Dominance: Left  Intervention    Patient Profile     71 y.o. male with a PMHx of nonobstructive CAD 2011, AAA s/p repair 2013, HTN, HLD, T2DM, RCC s/p partial left nephrectomy 2015, OSA, GERD, RLS. who is being seen 07/18/2022 for the evaluation of acute on chronic HFrEF, NSTEMI and known CAD at the request of the primary hospitalist service.  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 on HD -- Initial presentation was been significant volume overload in the setting of HFrEF and ESRD.  -- Nephrology on board   HFrEF  ICM  -- Echo 06/12/22 LVEF 30-35% with clear evidence of LV thrombus. Akinesis of the anterior wall. No significant valve disease -- volume management per HD  -- on coreg 6.'25mg'$  BID, hydralazine '25mg'$  BID, isordil '20mg'$  BID -- currently no ACE/ARB/ARNi, and no SGLT2 inhibitor with renal disease.   CAD w/ Angina Elevated Troponin -- cath through the [place] with severe 2v disease with CTO of the LAD and 80% Lcx. Outpatient cardiac MRI 10/13 without  viable myocardium. Per Dr. Kipp Brood, not a candidate for CABG. Underwent PCI/DES of mLcx on 10/20. Recommendations for triple therapy with ASA, plavix and coumadin for one month, then drop ASA. Given his chronic anemia along with one episode of coffee ground emesis, question whether we can drop ASA at DC with plans to continue plavix/coumadin -- continue ASA, statin, BB, Isordil and hydralazine.   LV thrombus -- diagnosed at the [place] -- Cardiac MRI + thrombus -- plan to resume coumadin potentially tomorrow), INR 2.7 -- Eliquis may be better option?   HTN  -- continue coreg 6.'25mg'$  BID, hydralazine '25mg'$  TID, Isordil 20 mg   HLD  -- recent LDL 60 -- Continue with atorvastatin 40 mg daily.   Bilateral LE pain -- Significant bilateral leg pain which is worse with exertion but also at rest when not wearing nasal cannula oxygen. Recent LE dopplers (07/11/2022) normal.   Anemia GI bleed (coffee ground emesis/melena) -- Hgb chronically low, overall remains relatively stable -- Tsat 10, received IV iron  -- had single episode of coffee ground emesis but also reported episodes of melena -- s/p 2 units PRBCs -- remains on IV protonix '40mg'$  BID -- on ASA and plavix  Abd pain Constipation  -- has not had a BM in several days --  received miralx this morning -- add colace  For questions or updates, please contact [place] Please consult [email or web address] for contact info under        Signed, Reino Bellis, NP  07/25/2022, 9:28 AM      Patient seen and examined. Agree with assessment and plan.  No recurrent chest pain. GI has reevaluated patient and upper GI bleed seems to have resolved.  Hemoglobin low but stable.  No signs of ongoing bleeding.  Patient is on Protonix drip.  Probably resume Coumadin rather than Eliquis with LV thrombus and if necessary may need to hold aspirin but continue Plavix or if stable triple therapy for 1 month then Plavix/warfarin.  Heart rate currently  in the 90s.  BP stable.  Consider slight titration of carvedilol to 9.375 mg twice a day as blood pressure allows for improved rate control and his underlying CAD.  Troy Sine, MD, [facility] 07/25/2022 12:29 PM

## 2022-07-25 NOTE — Progress Notes (Signed)
   07/25/22 0600  Assess: MEWS Score  Temp 98.1 F (36.7 C)  BP 111/71  MAP (mmHg) 82  Pulse Rate (!) 104  ECG Heart Rate (!) 104  Resp (!) 26  Level of Consciousness Alert  SpO2 96 %  O2 Device Nasal Cannula  O2 Flow Rate (L/min) 4 L/min  Assess: MEWS Score  MEWS Temp 0  MEWS Systolic 0  MEWS Pulse 1  MEWS RR 2  MEWS LOC 0  MEWS Score 3  MEWS Score Color Yellow  Assess: if the MEWS score is Yellow or Red  Were vital signs taken at a resting state? Yes  Focused Assessment Change from prior assessment (see assessment flowsheet)  Does the patient meet 2 or more of the SIRS criteria? Yes  Does the patient have a confirmed or suspected source of infection? No  MEWS guidelines implemented *See Row Information* Yes  Treat  MEWS Interventions Administered prn meds/treatments  Pain Scale 0-10  Pain Score 4  Pain Type Acute pain  Pain Location Abdomen  Pain Orientation Lower  Pain Radiating Towards denies  Pain Descriptors / Indicators Sharp  Pain Frequency Intermittent  Pain Onset Gradual  Patients Stated Pain Goal 1  Pain Intervention(s) Medication (See eMAR);Rest  Multiple Pain Sites No  Take Vital Signs  Increase Vital Sign Frequency  Yellow: Q 2hr X 2 then Q 4hr X 2, if remains yellow, continue Q 4hrs  Escalate  MEWS: Escalate Yellow: discuss with charge nurse/RN and consider discussing with provider and RRT  Notify: Charge Nurse/RN  Name of Charge Nurse/RN Notified Christina, RN  Date Charge Nurse/RN Notified 07/25/22  Time Charge Nurse/RN Notified 0600  Notify: Provider  Provider Name/Title V. Marlowe Sax, MD  Date Provider Notified 07/25/22  Time Provider Notified [phone]  Method of Notification Page (secure chat)  Notification Reason Other (Comment) (SOB, on and off confusion)  Provider response Other (Comment) (hold dilaudid for now, awaiting CXR)  Date of Provider Response 07/25/22  Time of Provider Response [phone]  Document  Patient Outcome Other  (Comment) (stable with on and off confusion.)  Progress note created (see row info) Yes  Assess: SIRS CRITERIA  SIRS Temperature  0  SIRS Pulse 1  SIRS Respirations  1  SIRS WBC 1  SIRS Score Sum  3

## 2022-07-26 ENCOUNTER — Inpatient Hospital Stay (HOSPITAL_COMMUNITY): Payer: No Typology Code available for payment source

## 2022-07-26 DIAGNOSIS — N170 Acute kidney failure with tubular necrosis: Secondary | ICD-10-CM | POA: Diagnosis not present

## 2022-07-26 DIAGNOSIS — I21A1 Myocardial infarction type 2: Secondary | ICD-10-CM

## 2022-07-26 DIAGNOSIS — I25119 Atherosclerotic heart disease of native coronary artery with unspecified angina pectoris: Secondary | ICD-10-CM | POA: Diagnosis not present

## 2022-07-26 DIAGNOSIS — E1169 Type 2 diabetes mellitus with other specified complication: Secondary | ICD-10-CM | POA: Diagnosis not present

## 2022-07-26 DIAGNOSIS — I5023 Acute on chronic systolic (congestive) heart failure: Secondary | ICD-10-CM | POA: Diagnosis not present

## 2022-07-26 DIAGNOSIS — K56609 Unspecified intestinal obstruction, unspecified as to partial versus complete obstruction: Secondary | ICD-10-CM | POA: Diagnosis not present

## 2022-07-26 LAB — RENAL FUNCTION PANEL
Albumin: 2.1 g/dL — ABNORMAL LOW (ref 3.5–5.0)
Anion gap: 18 — ABNORMAL HIGH (ref 5–15)
BUN: 78 mg/dL — ABNORMAL HIGH (ref 8–23)
CO2: 23 mmol/L (ref 22–32)
Calcium: 8.1 mg/dL — ABNORMAL LOW (ref 8.9–10.3)
Chloride: 91 mmol/L — ABNORMAL LOW (ref 98–111)
Creatinine, Ser: 7.2 mg/dL — ABNORMAL HIGH (ref 0.61–1.24)
GFR, Estimated: 7 mL/min — ABNORMAL LOW (ref >60)
Glucose, Bld: 107 mg/dL — ABNORMAL HIGH (ref 70–99)
Phosphorus: 6 mg/dL — ABNORMAL HIGH (ref 2.5–4.6)
Potassium: 5.5 mmol/L — ABNORMAL HIGH (ref 3.5–5.1)
Sodium: 132 mmol/L — ABNORMAL LOW (ref 135–145)

## 2022-07-26 LAB — CBC
HCT: 25 % — ABNORMAL LOW (ref 39.0–52.0)
Hemoglobin: 8.2 g/dL — ABNORMAL LOW (ref 13.0–17.0)
MCH: 30.5 pg (ref 26.0–34.0)
MCHC: 32.8 g/dL (ref 30.0–36.0)
MCV: 92.9 fL (ref 80.0–100.0)
Platelets: 196 10*3/uL (ref 150–400)
RBC: 2.69 MIL/uL — ABNORMAL LOW (ref 4.22–5.81)
RDW: 19.9 % — ABNORMAL HIGH (ref 11.5–15.5)
WBC: 5.7 10*3/uL (ref 4.0–10.5)
nRBC: 0 % (ref 0.0–0.2)

## 2022-07-26 LAB — PROTIME-INR
INR: 3.4 — ABNORMAL HIGH (ref 0.8–1.2)
Prothrombin Time: 33.7 seconds — ABNORMAL HIGH (ref 11.4–15.2)

## 2022-07-26 LAB — GLUCOSE, CAPILLARY
Glucose-Capillary: 123 mg/dL — ABNORMAL HIGH (ref 70–99)
Glucose-Capillary: 125 mg/dL — ABNORMAL HIGH (ref 70–99)

## 2022-07-26 MED ORDER — BISACODYL 5 MG PO TBEC
10.0000 mg | DELAYED_RELEASE_TABLET | Freq: Every day | ORAL | Status: DC
  Administered 2022-07-26: 10 mg via ORAL
  Filled 2022-07-26 (×2): qty 2

## 2022-07-26 MED ORDER — ANTICOAGULANT SODIUM CITRATE 4% (200MG/5ML) IV SOLN: 5.0000 mL | Status: DC | PRN

## 2022-07-26 MED ORDER — SORBITOL 70 % SOLN
30.0000 mL | Freq: Once | Status: AC
  Administered 2022-07-26: 30 mL via ORAL
  Filled 2022-07-26: qty 30

## 2022-07-26 MED ORDER — LIDOCAINE HCL (PF) 1 % IJ SOLN: 5.0000 mL | INTRAMUSCULAR | Status: DC | PRN

## 2022-07-26 MED ORDER — LIDOCAINE-PRILOCAINE 2.5-2.5 % EX CREA: 1.0000 | TOPICAL_CREAM | CUTANEOUS | Status: DC | PRN

## 2022-07-26 MED ORDER — HEPARIN SODIUM (PORCINE) 1000 UNIT/ML DIALYSIS: 1000.0000 [IU] | INTRAMUSCULAR | Status: DC | PRN

## 2022-07-26 MED ORDER — PANTOPRAZOLE SODIUM 40 MG IV SOLR
40.0000 mg | Freq: Two times a day (BID) | INTRAVENOUS | Status: DC
  Administered 2022-07-26 – 2022-08-02 (×13): 40 mg via INTRAVENOUS
  Filled 2022-07-26 (×14): qty 10

## 2022-07-26 MED ORDER — PENTAFLUOROPROP-TETRAFLUOROETH EX AERO: 1.0000 | INHALATION_SPRAY | CUTANEOUS | Status: DC | PRN

## 2022-07-26 MED ORDER — ONDANSETRON HCL 4 MG/2ML IJ SOLN: 4.0000 mg | Freq: Four times a day (QID) | INTRAMUSCULAR | Status: DC | PRN

## 2022-07-26 MED ORDER — HYDROMORPHONE HCL 1 MG/ML IJ SOLN
0.5000 mg | INTRAMUSCULAR | Status: DC | PRN
  Administered 2022-07-26 – 2022-08-01 (×11): 0.5 mg via INTRAVENOUS
  Filled 2022-07-26 (×11): qty 1

## 2022-07-26 MED ORDER — ALTEPLASE 2 MG IJ SOLR
2.0000 mg | Freq: Once | INTRAMUSCULAR | Status: DC | PRN
  Filled 2022-07-26: qty 2

## 2022-07-26 NOTE — Progress Notes (Signed)
Mobility Specialist - Progress Note   07/26/22 1530  Mobility  Activity Transferred to/from [facility]  Level of Assistance Contact guard assist, steadying assist  Assistive Device Other (Comment) (HHA)  Activity Response Tolerated poorly  Mobility Referral Yes  $Mobility charge 1 Mobility   Pt received on BSC after setting off bed alarm. Pt needed multiple cues to get back to bed safely. Pt was returned to bed with all needs met and bed alarm on.   Larey Seat

## 2022-07-26 NOTE — Progress Notes (Signed)
[facility] Progress Note  Allen Cortez 71 y.o. 1951/06/01   Subjective: Reports brown stool overnight (no documentation seen in flowsheets). Feels ok. On hemodialysis.  Objective: Vital signs: Vitals:   07/26/22 0830 07/26/22 0900  BP: 111/70 112/72  Pulse: 94 93  Resp: 20 (!) 23  Temp:    SpO2: 96% 97%  T 97.7  Physical Exam: Gen: lethargic, chronically ill-appearing, thin, no acute distress  HEENT: anicteric sclera CV: RRR Chest: CTA B Abd: diffusely tender with guarding, soft, nondistended, +BS Ext: no edema  Lab Results: Recent Labs    07/26/22 0244  NA 132*  K 5.5*  CL 91*  CO2 23  GLUCOSE 107*  BUN 78*  CREATININE 7.20*  CALCIUM 8.1*  PHOS 6.0*   Recent Labs    07/26/22 0244  ALBUMIN 2.1*   Recent Labs    07/25/22 0213 07/26/22 0244  WBC 8.3 5.7  HGB 7.9* 8.2*  HCT 23.9* 25.0*  MCV 93.4 92.9  PLT 144* 196      Assessment/Plan: GI bleed  seems to have resolved. Hgb stable. Change to PPI IV Q 12 hours. Supportive care. Suspect abdominal pain due to constipation. Need documentation of stools in chart. F/U with GI prn. Will sign off. Call if questions.   Allen Cortez 07/26/2022, 9:40 AM  Questions please call [phone] Patient ID: Allen Cortez, male   DOB: 23-Nov-1950, 71 y.o.   MRN: [id number]

## 2022-07-26 NOTE — Progress Notes (Addendum)
[facility] Progress Note   Subjective:   Patient seen and examined in dialysis.  Tolerating treatment well so far.  TDC not functioning well, will not allow for full BFR, now at 250.  Discussed cath flow dwell with nurse post HD.  Continues to have constipation.  Denies CP, SOB, abdominal pain and n/v/d.   Objective Vitals:   07/26/22 0930 07/26/22 1000 07/26/22 1030 07/26/22 1100  BP: 103/75 1'00/69 90/65 96/65 '$  Pulse: 96 96 95 95  Resp: (!) 24 (!) 24 18 (!) 24  Temp:      TempSrc:      SpO2: 94% 97% 97% 97%  Weight:      Height:       Physical Exam General:chronically ill appearing male in NAD Heart:RRR, no mrg Lungs:breath sounds decreased, nml WOB on 3L O2 via [place] Abdomen:soft, NT, mildly distended Extremities:no LE edema Dialysis Access: TDC in use   Filed Weights   07/23/22 0744 07/23/22 1217 07/26/22 0806  Weight: 63.3 kg 64.2 kg 63.7 kg    Intake/Output Summary (Last 24 hours) at 07/26/2022 1123 Last data filed at 07/25/2022 2200 Gross per 24 hour  Intake 240 ml  Output 150 ml  Net 90 ml    Additional Objective Labs: Basic Metabolic Panel: Recent Labs  Lab 07/20/22 0948 07/21/22 0144 07/22/22 0541 07/23/22 0223 07/26/22 0244  NA 135   < > 135 137 132*  K 4.3   < > 3.7 4.7 5.5*  CL 97*   < > 95* 91* 91*  CO2 29   < > '28 27 23  '$ GLUCOSE 104*   < > 94 125* 107*  BUN 20   < > 20 40* 78*  CREATININE 3.83*   < > 3.97* 5.53* 7.20*  CALCIUM 8.5*   < > 8.4* 8.7* 8.1*  PHOS 3.4  --   --   --  6.0*   < > = values in this interval not displayed.   Liver Function Tests: Recent Labs  Lab 07/20/22 0948 07/26/22 0244  ALBUMIN 2.6* 2.1*   No results for input(s): "LIPASE", "AMYLASE" in the last 168 hours. CBC: Recent Labs  Lab 07/22/22 1457 07/22/22 2233 07/23/22 0223 07/24/22 0145 07/24/22 1701 07/25/22 0213 07/26/22 0244  WBC 7.2  --  11.3* 14.8*  --  8.3 5.7  HGB 7.8*   < > 8.8* 7.2* 8.1* 7.9* 8.2*  HCT 24.6*   < > 26.5* 21.9*  24.8* 23.9* 25.0*  MCV 100.0  --  96.4 97.3  --  93.4 92.9  PLT 212  --  173 154  --  144* 196   < > = values in this interval not displayed.   Blood Culture No results found for: "SDES", "SPECREQUEST", "CULT", "REPTSTATUS"  Cardiac Enzymes: No results for input(s): "CKTOTAL", "CKMB", "CKMBINDEX", "TROPONINI" in the last 168 hours. CBG: Recent Labs  Lab 07/25/22 0547 07/25/22 0832 07/25/22 1218 07/25/22 1558 07/25/22 2007  GLUCAP 134* 128* 133* 120* 115*   Iron Studies: No results for input(s): "IRON", "TIBC", "TRANSFERRIN", "FERRITIN" in the last 72 hours. Lab Results  Component Value Date   INR 3.4 (H) 07/26/2022   INR 2.7 (H) 07/25/2022   INR 1.9 (H) 07/24/2022   Studies/Results: DG CHEST PORT 1 VIEW  Result Date: 07/25/2022 CLINICAL DATA:  Shortness of breath EXAM: PORTABLE CHEST 1 VIEW COMPARISON:  07/18/2022 FINDINGS: Central line remains at the [facility] RA junction. Cardiomegaly persists. Pulmonary edema persists, similar to the previous study. Layering pleural  fluid is probably present. No new finding. IMPRESSION: No significant change. Persistent cardiomegaly and pulmonary edema. Probable layering pleural fluid. Electronically Signed   By: Nelson Chimes M.D.   On: 07/25/2022 06:53    Medications:  sodium chloride     anticoagulant sodium citrate     ferric gluconate (FERRLECIT) IVPB 250 mg (07/26/22 1111)   promethazine (PHENERGAN) injection (IM or IVPB) Stopped (07/23/22 1026)    aspirin EC  81 mg Oral Daily   atorvastatin  40 mg Oral QHS   bisacodyl  10 mg Oral Q1200   carvedilol  6.25 mg Oral BID WC   Chlorhexidine Gluconate Cloth  6 each Topical Q0600   citalopram  20 mg Oral QHS   clonazePAM  0.5 mg Oral QHS   clopidogrel  75 mg Oral Q breakfast   darbepoetin (ARANESP) injection - DIALYSIS  150 mcg Intravenous Q Sat-HD   docusate sodium  100 mg Oral BID   feeding supplement (NEPRO CARB STEADY)  237 mL Oral TID BM   guaiFENesin  600 mg Oral BID   hydrALAZINE   25 mg Oral BID   insulin aspart  0-6 Units Subcutaneous TID AC & HS   isosorbide dinitrate  20 mg Oral BID   multivitamin  1 tablet Oral QHS   pantoprazole (PROTONIX) IV  40 mg Intravenous Q12H   polyethylene glycol  17 g Oral BID   QUEtiapine  300 mg Oral QHS   sodium chloride flush  3 mL Intravenous Q12H   sorbitol  30 mL Oral Once    Dialysis Orders: [facility] 4hr TTS EDW presumably 70kg 3/2.25, NO UFP Heparin 2K Units Mircera 50 q2weeks (last given 10/14)   Background: 71 y.o. 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 He is presenting with worsening shortness of breath, orthopnea, poor exercise tolerance;  he has decreased urine output  CXR showed Interval increased prominence of the interstitial markings and increased patchy densities in both lungs. Small bilateral pleural effusions.    Assessment/Plan: Acute hypoxic resp failure - likely related to volume overload. CXR w/CHF and b/l pleural effusions.  Improved, now under dry weight.  Repeat CXR 10/23 with persistent cardiomegaly and pulmonary edema. Probable layering pleural fluid.  UF limited by cramping, continue to titrate down as tolerated.  AKI on CKD 3b - baseline creatinine 1.6 prior to recent heart cath. - Next HD 10/24. Minimal to no UOP, no signs of renal recovery so far. HD dependent for 6 weeks- this likely means ESRD-  cont on TTS schedule.  Hx severe leg pain with dialysis.  At outpt HD he actually takes Oxycodone prescribed by cardiology for the leg pain per family.  HTN - BP in goal w/Coreg 6.'25mg'$  BID, hydralazine '25mg'$  BID, and isordil '20mg'$  BID.  d/c home lasix d/t minimal to no UOP. CAD - sp LHC on 8/22 at [facility] as above and seen by CTS. MRI last 10/13, may need CABG  with total occlusion in the LAD.  He may not recover renal function and may need to proceed with CABG with the designation of ESRD.  CTS consulted - no plans for surgery at this time, high risk.  s/p DES to circ on 10/20. Needs  triple AC therapy - ASA, plavix and coumadin x 1 month, then just plavix, coumadin. Per GI ok with ASA, plavix therapy continued as long as no overt bleeding, hold coumadin if ongoing drop in Hgb, ok to restart heparin if Hgb stable.  Per cardiology A/C systolic CHF - echo with EF 30-35%.  Meds limited by kidney function.  Volume removal with HD.  Anemia-  Hgb ^8.2 s/p 1 unit pRBC 10/22, 3 units total since admission. Tsat 19%, getting iron load.  ESA 150 qSat. Bones-  CCa and phos in goal.  Not on binders or VDRA.  Nutrition - Currently on heart healthy/carb modified diet w/ fluid restrictions. Alb 2.7 - start protein supplements.  Jen Mow, PA-C [facility] 07/26/2022,11:23 AM  LOS: 8 days    Seen and examined independently.  Agree with note and exam as documented above by physician extender and as noted here.  See my procedure note from today .  Claudia Desanctis, MD 07/26/2022  12:14 PM

## 2022-07-26 NOTE — Progress Notes (Signed)
Received patient in bed to unit.  Alert and oriented.  Informed consent signed and in chart.   Treatment initiated: 0758 Treatment completed: 1150  Patient tolerated well.  Transported back to the room  Alert, without acute distress.  Hand-off given to patient's nurse.   Access used: Cath.  Access issues: Unable to maintain blood flow rate. Cath was Cathflo'd post tx.   Total UF removed: 1.5L Medication(s) given: Ferrous Gluconate. Post HD VS: 116/72,99,13,95%97.8 Post HD weight: 62.2   [facility]

## 2022-07-26 NOTE — Progress Notes (Addendum)
Rounding Note    Patient Name: HILARY PUNDT Date of Encounter: 07/26/2022  [place] Cardiologist: Shelva Majestic, MD   Subjective   Still with ongoing belly pain. No chest pain. Seen in HD.   Inpatient Medications    Scheduled Meds:  aspirin EC  81 mg Oral Daily   atorvastatin  40 mg Oral QHS   carvedilol  6.25 mg Oral BID WC   Chlorhexidine Gluconate Cloth  6 each Topical Q0600   citalopram  20 mg Oral QHS   clonazePAM  0.5 mg Oral QHS   clopidogrel  75 mg Oral Q breakfast   darbepoetin (ARANESP) injection - DIALYSIS  150 mcg Intravenous Q Sat-HD   docusate sodium  100 mg Oral BID   feeding supplement (NEPRO CARB STEADY)  237 mL Oral TID BM   guaiFENesin  600 mg Oral BID   hydrALAZINE  25 mg Oral BID   insulin aspart  0-6 Units Subcutaneous TID AC & HS   isosorbide dinitrate  20 mg Oral BID   multivitamin  1 tablet Oral QHS   pantoprazole (PROTONIX) IV  40 mg Intravenous Q12H   polyethylene glycol  17 g Oral BID   QUEtiapine  300 mg Oral QHS   sodium chloride flush  3 mL Intravenous Q12H   Continuous Infusions:  sodium chloride     anticoagulant sodium citrate     ferric gluconate (FERRLECIT) IVPB 135 mL/hr at 07/23/22 1622   promethazine (PHENERGAN) injection (IM or IVPB) Stopped (07/23/22 1026)   PRN Meds: sodium chloride, acetaminophen **OR** acetaminophen, albuterol, alteplase, anticoagulant sodium citrate, bisacodyl, heparin, lidocaine (PF), lidocaine-prilocaine, ondansetron, mouth rinse, pentafluoroprop-tetrafluoroeth, promethazine (PHENERGAN) injection (IM or IVPB), sodium chloride flush   Vital Signs    Vitals:   07/26/22 0830 07/26/22 0900 07/26/22 0930 07/26/22 1000  BP: 111/70 112/72 103/75 100/69  Pulse: 94 93 96 96  Resp: 20 (!) 23 (!) 24 (!) 24  Temp:      TempSrc:      SpO2: 96% 97% 94% 97%  Weight:      Height:        Intake/Output Summary (Last 24 hours) at 07/26/2022 1013 Last data filed at 07/25/2022 2200 Gross per 24  hour  Intake 240 ml  Output 150 ml  Net 90 ml      07/26/2022    8:06 AM 07/23/2022   12:17 PM 07/23/2022    7:44 AM  Last 3 Weights  Weight (lbs) 140 lb 6.9 oz 141 lb 8.6 oz 139 lb 8.8 oz  Weight (kg) 63.7 kg 64.2 kg 63.3 kg      Telemetry    Sinus Rhythm - Personally Reviewed  ECG    No new tracing  Physical Exam   GEN: Frail older male, laying in bed. Neck: No JVD Cardiac: RRR, no murmurs, rubs, or gallops.  Respiratory: Clear to auscultation bilaterally. GI: Soft, nontender, mildly distended, + BS MS: No edema; No deformity. Neuro:  Nonfocal  Psych: Normal affect   Labs    High Sensitivity Troponin:   Recent Labs  Lab 07/18/22 1653 07/18/22 1845 07/18/22 2045 07/19/22 0454  TROPONINIHS 266* 271* 256* 288*     Chemistry Recent Labs  Lab 07/20/22 0948 07/21/22 0144 07/22/22 0541 07/23/22 0223 07/26/22 0244  NA 135   < > 135 137 132*  K 4.3   < > 3.7 4.7 5.5*  CL 97*   < > 95* 91* 91*  CO2 29   < >  $'28 27 23  'M$ GLUCOSE 104*   < > 94 125* 107*  BUN 20   < > 20 40* 78*  CREATININE 3.83*   < > 3.97* 5.53* 7.20*  CALCIUM 8.5*   < > 8.4* 8.7* 8.1*  ALBUMIN 2.6*  --   --   --  2.1*  GFRNONAA 16*   < > 15* 10* 7*  ANIONGAP 9   < > 12 19* 18*   < > = values in this interval not displayed.    Lipids No results for input(s): "CHOL", "TRIG", "HDL", "LABVLDL", "LDLCALC", "CHOLHDL" in the last 168 hours.  Hematology Recent Labs  Lab 07/24/22 0145 07/24/22 1701 07/25/22 0213 07/26/22 0244  WBC 14.8*  --  8.3 5.7  RBC 2.25*  --  2.56* 2.69*  HGB 7.2* 8.1* 7.9* 8.2*  HCT 21.9* 24.8* 23.9* 25.0*  MCV 97.3  --  93.4 92.9  MCH 32.0  --  30.9 30.5  MCHC 32.9  --  33.1 32.8  RDW 17.5*  --  19.9* 19.9*  PLT 154  --  144* 196   Thyroid No results for input(s): "TSH", "FREET4" in the last 168 hours.  BNPNo results for input(s): "BNP", "PROBNP" in the last 168 hours.  DDimer No results for input(s): "DDIMER" in the last 168 hours.   Radiology    DG CHEST  PORT 1 VIEW  Result Date: 07/25/2022 CLINICAL DATA:  Shortness of breath EXAM: PORTABLE CHEST 1 VIEW COMPARISON:  07/18/2022 FINDINGS: Central line remains at the [facility] RA junction. Cardiomegaly persists. Pulmonary edema persists, similar to the previous study. Layering pleural fluid is probably present. No new finding. IMPRESSION: No significant change. Persistent cardiomegaly and pulmonary edema. Probable layering pleural fluid. Electronically Signed   By: Nelson Chimes M.D.   On: 07/25/2022 06:53    Cardiac Studies   Cardiac MRI on 07/15/22 IMPRESSION: 1. Subendocardial LGE consistent with prior infarct in mid anterior/anteroseptal, apical anterior/septal/inferior/lateral walls, and apex. LGE is greater than 50% transmural suggesting nonviability in apical anterior/septal/inferior/lateral walls and apex. LGE <50% transmural in mid anterior wall but wall thickness <4.79m suggests nonviability. Findings are consistent with nonviability in mid to distal LAD territory 2. Severe LV dilatation with severe systolic dysfunction (EF 288%. Thinning/akinesis of mid to apical anterior/anteroseptal walls and apical inferior/lateral walls. Aneurysm of apex 3.  LV apical thrombus measures 173mx 47m38m.  Normal RV size and systolic function (EF 48%50%. Diffuse pulmonary opacities and bilateral pleural effusions (moderate on right, small on left), consider CT chest for further Evaluation.   Echo in 06/2022  1. 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  Left ventricular ejection fraction, by estimation, is 30-35%. The left  ventricle is abnormal. The left  ventricle has focal regional wall motion abnormalities. The left  ventricular internal cavity size was mildly dilated. There is akinesis of  the left ventricular, apical septal wall, inferior wall, anterior wall and  lateral wall. There is akinesis of  the  left ventricular, entire apical segment. There is severe hypokinesis of  the left ventricular, basal-mid anterior wall. A falst tendon is present  in the mid LV cavity.   Cath: 07/22/22     Prox LAD to Mid LAD lesion is 100% stenosed.   Dist Cx lesion is 80% stenosed.   A drug-eluting stent was successfully placed using a SYNERGY XD 3.0X20.  Post intervention, there is a 0% residual stenosis.   Successful PCI of the mid LCx with DES x 1     Plan: DAPT with ASA for one month, plavix for 12 months. OK to resume Coumadin tomorrow if no bleeding complications.     Diagnostic Dominance: Left  Intervention     Patient Profile     71 y.o. male with a PMHx of nonobstructive CAD 2011, AAA s/p repair 2013, HTN, HLD, T2DM, RCC s/p partial left nephrectomy 2015, OSA, GERD, RLS. who is being seen 07/18/2022 for the evaluation of acute on chronic HFrEF, NSTEMI and known CAD at the request of the primary hospitalist service.  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 on HD -- Initial presentation he was significantly volume overload in the setting of HFrEF and ESRD.  -- Nephrology on board   HFrEF  ICM  -- Echo 06/12/22 LVEF 30-35% with clear evidence of LV thrombus. Akinesis of the anterior wall. No significant valve disease -- volume management per HD  -- on coreg 6.'25mg'$  BID, hydralazine '25mg'$  BID, isordil '20mg'$  BID. Blood pressures tolerating but soft -- currently no ACE/ARB/ARNi, and no SGLT2 inhibitor with renal disease.   CAD w/ Angina Elevated Troponin -- cath through the [facility] with severe 2v disease with CTO of the LAD and 80% Lcx. Outpatient cardiac MRI 10/13 without viable myocardium. Per Dr. Kipp Brood, not a candidate for CABG. Underwent PCI/DES of mLcx on 10/20. Recommendations for triple therapy with ASA, plavix and coumadin for one month, then drop ASA. Given his chronic anemia along with one episode of coffee ground emesis, question  whether we can drop ASA at DC with plans to continue plavix/coumadin vs Eliquis  -- continue ASA, statin, BB, Isordil and hydralazine.   LV thrombus -- diagnosed at the [facility] -- Cardiac MRI + thrombus -- plan to resume anticoagulation but INR remains elevated at 3.4 -- Eliquis may be better option? -- may need Vit K if INR continues to rise    HTN  -- continue coreg 6.'25mg'$  BID, hydralazine '25mg'$  TID, Isordil 20 mg   HLD  -- recent LDL 60 -- Continue with atorvastatin 40 mg daily.   Bilateral LE pain -- Significant bilateral leg pain which is worse with exertion but also at rest when not wearing nasal cannula oxygen. Recent LE dopplers (07/11/2022) normal.   Anemia GI bleed (coffee ground emesis/melena) -- Hgb chronically low, overall remains relatively stable -- Tsat 10, received IV iron  -- had single episode of coffee ground emesis but also reported episodes of melena -- s/p 2 units PRBCs -- remains on IV protonix '40mg'$  BID -- on ASA and plavix   Abd pain Constipation  -- had a small BM last evening,  -- continue miralax, colace and sorbitol   For questions or updates, please contact [place] Please consult [email or web address] for contact info under        Signed, Reino Bellis, NP  07/26/2022, 10:13 AM     Patient seen and examined. Agree with assessment and plan. INR increased to 3.4, he received 5 mg warfarin on 10/21 and none since and continues on ASA 81 and plavix.  PCI to Lcx on 07/22/22. Needs improved nutrition. May ultimately require 1 or 2 mg warfarin to aim for INR ~ 2.5 range. Check LFT's. and INR tomorrow.    Troy Sine, MD, [facility] 07/26/2022 11:46 AM

## 2022-07-26 NOTE — Progress Notes (Signed)
Progress Note   Patient: Allen Cortez SNK:[id number] DOB: Jul 27, 1951 DOA: 07/18/2022     8 DOS: the patient was seen and examined on 07/26/2022   Brief hospital course: Mr. Luzier was admitted to the hospital with volume overload in the setting of ESRD, complicated with coronary artery disease. Hospitalization complicated with upper GI bleed.   71 yo male with the past medical history of CAD, systolic heart failure, LV thrombus, right renal cancer sp partial left nephrectomy, T2DM, ESRD and chronic hypoxemic respiratory failure who presented with dyspnea. At home he had progressive dyspnea and orthopnea despite renal replacement therapy with ultrafiltration on T-TH-Sat schedule. On his initial physical examination his blood pressure was 148/94, HT 94, lungs with no wheezing, heart with S1 and S2 present and rhythmic, abdomen with no distention, no lower extremity edema.   Na 137, K 4,1 CL 97 bicarbonate 28 glucose 130 bun 51 cr 6,0  BNP 2,398 High sensitive troponin 266, 271  Wbc 7,8 hgb 6,6 plt [place] 19 negative   Chest radiograph with cardiomegaly, bilateral interstitial infiltrates, symmetric, bilateral small pleural effusions, right IJ vein HD catheter in place.   EKG 85 bpm, left axis deviation, normal intervals, sinus rhythm with poor R to R wave progression, with no significant ST segment or T wave changes, positive LVH.   10/17 Patient had urgent HD with ultrafiltration  10/19 HD. Plan for multidisciplinary cardiovascular meeting tomorrow to decide if patient will benefit more from PCI than CABG.  10/20 PCI  Coffee ground emesis and abdominal pain post procedure.  1 unit PRBC transfusion.  10/21 GI consulted with recommendations of IV pantoprazole, high risk for endoscopic procedure.  10/22 PRBC transfusion due to low hgb below 8.  10/23 no signs of further bleeding, resumed anticoagulation.  10/24 no bleeding and tolerating HD well. INR is therapeutic  Medically  stable to be transfer to SNF.   Assessment and Plan: Acute kidney injury (AKI) with acute tubular necrosis (ATN) (HCC) ESRD on HD.  Patient with improving volume status Hyperkalemia, hyponatremia.   K today is 5,5 with serum bicarbonate at 23 and BUN 78 pre HD.   Plan to continue ultrafiltration and hemodialysis per nephrology recommendations.  Acute on chronic hypoxemic respiratory failure due to acute pulmonary edema Oxygenation has improved 02 saturation today 95% on 3 L/min per [place].   Anemia of chronic renal disease with iron deficiency  Continue with EPO and iron.   Acute on chronic systolic congestive heart failure (HCC)  Echocardiogram with reduced LV systolic function EF 30 to 35%, akinesis of the LV apical segment wall, inferior wall, anterior wall and lateral wall. Akinesis of the entire apical segment. Severe hypokinesis of the left ventricle, basal mid anterior wall. Preserved RV systolic function. RVSP 45.5 mmHg.   Continue with carvedilol After load reduction with hydralazine and isosorbide.    LV thrombus, heparin has been stopped, INR is 3.5   Continue anticoagulation with warfarin.     Acute on chronic systolic CHF (congestive heart failure) (HCC)-resolved as of 07/21/2022  Echocardiogram with reduced LV systolic function EF 30 to 35%, akinesis of the LV apical segment wall, inferior wall, anterior wall and lateral wall. Akinesis of the entire apical segment. Severe hypokinesis of the left ventricle, basal mid anterior wall. Preserved RV systolic function. RVSP 45.5 mmHg.  Systolic blood pressure  937 to 130 mmHg.   Continue close blood pressure monitoring.  Continue with carvedilol After load reduction with hydralazine and isosorbide.  Continue heparin for LV thrombus, transition to warfarin after completing cardiac workup.   Coronary artery disease involving native coronary artery of native heart with angina pectoris ([place]): 100% mLAD, 80% m(AVG)LCx Cardiac  catheterization with 2 vessel disease, LAD 80%, Lcx.    Continue medical therapy with aspirin/ clopidogrel and B blockade. Statin therapy.  Cardiac MRI with mid anterior/ anteroseptal, apical, septal, inferior, lateral walls and apex with LGE consistent with prior infarct. LGE greater than 50%, suggesting nonviability in apical anterior, septal, inferior and lateral walls and apex.  Nonviability in mid to distal LAD territory    Catheterization Prox LAD to Mid LAD 100% stenosed  Distal Cx lesion 80% stenosed  Patient had a successful drug eluding stent placed, to the mid LCx, Recommendations to continue warfarin, aspirin for 1 month and clopidogrel for 6 months.   Continue with dual antiplatelet   Coronary atherosclerosis of native coronary artery-resolved as of 07/21/2022 Cardiac catheterization with 2 vessel disease, LAD 80%, Lcx.   Continue medical therapy with aspirin and B blockade. Statin therapy.  Cardiac MRI with mid anterior/ anteroseptal, apical, septal, inferior, lateral walls and apex with LGE consistent with prior infarct. LGE greater than 50%, suggesting nonviability in apical anterior, septal, inferior and lateral walls and apex.  Nonviability in mid to distal LAD territory   Patient likely not candidate for surgery revascularization CABG. Follow up with cardiology recommendations.  Continue medical therapy with aspirin and statin.   Type 2 diabetes mellitus with hyperlipidemia (HCC) Glucose has been stable, will hold on insulin therapy and will continue capillary glucose monitoring as needed bid.  Patient with poor oral intake.   GERD (gastroesophageal reflux disease) Continue with antiacid therapy.   PAD (peripheral artery disease) ([place]) Continue with statin therapy.   Upper GI bleed Patient with coffee ground emesis Sp 2 units PRBC transfusion   Follow up Hgb is 8,2 Patient with no further signs of bleeding Continue with pantoprazole.       Subjective:  patient very weak and deconditioned, no dyspnea or chest pain   Physical Exam: Vitals:   07/26/22 1100 07/26/22 1130 07/26/22 1214 07/26/22 1342  BP: 96/65 95/67 116/72 123/82  Pulse: 95 95 99 95  Resp: (!) 24 (!) 23 13   Temp:   97.8 F (36.6 C)   TempSrc:   Oral   SpO2: 97% 99% 95% 90%  Weight:   62.2 kg   Height:       Neurology somnolent post HD ENT with pallor Cardiovascular with S1 and S2 present and rhythmic Respiratory with no rales or wheezing Abdomen with no distention  No lower extremity edema.  Data Reviewed:    Family Communication: no family at the bedside. I spoke over the phone with the patient's daughter about patient's  condition, plan of care, prognosis and all questions were addressed.   Disposition: Status is: Inpatient Remains inpatient appropriate because: home  Planned Discharge Destination: home      Author: Tawni Millers, MD 07/26/2022 2:11 PM  For on call review [email or web address].

## 2022-07-26 NOTE — Progress Notes (Signed)
Nutrition Follow-up  DOCUMENTATION CODES:   Not applicable  INTERVENTION:  Continue current diet as ordered Nepro Shake po TID, each supplement provides 425 kcal and 19 grams protein Renal MVI with minerals daily  NUTRITION DIAGNOSIS:   Increased nutrient needs related to chronic illness (CHF, ESRD) as evidenced by estimated needs.  ongoing  GOAL:   Patient will meet greater than or equal to 90% of their needs  Goal unmet  MONITOR:   PO intake, Supplement acceptance, Labs, I & O's, Weight trends  REASON FOR ASSESSMENT:   Consult Assessment of nutrition requirement/status  ASSESSMENT:   71 y.o. male presented to the ED with shortness of breath and BLE swelling. PMH includes ESRD on HD (TTS), CAD, CHF, T2DM,  GERD, MDD, and NSTEMI. Pt admitted with acute on chronic CHF due to fluid overload.  GI placed pt on soft diet 10/21 he is was experiencing coffee ground emesis and melena which has resolved at this time. Plans to continue protonix. Per Cardiology, may need Vita K if INR continues to rise.   Pt off unit for HD. Noted pt unable to reach UF goal.   No documented meal completions on file to review. Reviewed MAR. Pt appears to be accepting 2 Nepro shakes on average daily.   Admit weight: 68.6 kg Current weight: 62.2 kg  Medications: dulcolax, colace, SSI 0-6 units TID, rena-vit, protonix, miralax, IV ferrlecit  Labs: sodium 132 (L), potassium 5.5 (H), BUN 78, Cr 7.20, Phos 6.0 (H), GFR 7, CBG's 115-136 x24 hours  UOP: 18m x12 hours  I/O's: -[id number] admission  Diet Order:   Diet Order             DIET SOFT Room service appropriate? Yes; Fluid consistency: Thin  Diet effective now                   EDUCATION NEEDS:   Education needs have been addressed  Skin:  Skin Assessment: Reviewed RN Assessment  Last BM:  10/21  Height:   Ht Readings from Last 1 Encounters:  07/19/22 '5\' 10"'$  (1.778 m)    Weight:   Wt Readings from Last 1  Encounters:  07/26/22 62.2 kg    Ideal Body Weight:  75.5 kg  BMI:  Body mass index is 19.68 kg/m.  Estimated Nutritional Needs:   Kcal:  2000-2200  Protein:  100-120 grams  Fluid:  UOP + 1 L  AlClayborne DanaRDN, LDN Clinical Nutrition

## 2022-07-26 NOTE — Procedures (Signed)
Seen and examined on dialysis.  Blood pressure 100/69 and HR 96.  2k bath.  Tolerating goal.  Procedure supervised.  RIJ tunn catheter in use  Full note to follow   Claudia Desanctis, MD 07/26/2022  10:05 AM

## 2022-07-26 NOTE — Progress Notes (Signed)
Patient with nausea and abdominal pain, very weak and deconditioned. His wife is at the bedside.  Patient not passing flatus and has not had bowel movement in several days.   BP 123/82   Pulse 95   Temp 97.8 F (36.6 C) (Oral)   Resp 13   Ht '5\' 10"'$  (1.778 m)   Wt 62.2 kg   SpO2 90%   BMI 19.68 kg/m   Neurology Awake but very deconditioned ENT pallor Cardiovascular with S1 and S2 present and rhythmic Respiratory with scattered rales but not wheezing Abdomen is very distended and tender to superficial palpation.  No rebound or guarding No lower extremity edema   Chest radiograph with no cardiomegaly, bilateral interstitial infiltrates.  Noted distended bowel, large bowel.   Plan:  Possible ileus or bowel obstruction Will get STAT ct abdomen and pelvis with no contrast Keep patient NPO Continue with as needed analgesics and antiemetics No NG tube due to risk of worsening upper GI bleeding, will wait for CT abdomen result before inserting any gastric tube.  I spoke with him and his wife about with worsening condition, heart failure, renal failure and GI bleeding he has a high risk for further deterioration.  He has decided to change code status to dnr.   Critical care time 45 minutes

## 2022-07-26 NOTE — Progress Notes (Signed)
ANTICOAGULATION CONSULT NOTE  Pharmacy Consult for Coumadin Indication: LV thrombus  Allergies  Allergen Reactions   Dilaudid [Hydromorphone Hcl] Other (See Comments)    hallucinations   Mirapex [Pramipexole Dihydrochloride] Other (See Comments)    Leg pain   Prednisone Other (See Comments)    irritability   Gabapentin     Other reaction(s): Tremor    Patient Measurements: Height: '5\' 10"'$  (177.8 cm) Weight: 63.7 kg (140 lb 6.9 oz) IBW/kg (Calculated) : 73  Vital Signs: Temp: 97.7 F (36.5 C) (10/24 0750) Temp Source: Oral (10/24 0750) BP: 100/69 (10/24 1000) Pulse Rate: 96 (10/24 1000)  Labs: Recent Labs    07/24/22 0145 07/24/22 1056 07/24/22 1701 07/25/22 0213 07/25/22 0729 07/26/22 0244  HGB 7.2*  --  8.1* 7.9*  --  8.2*  HCT 21.9*  --  24.8* 23.9*  --  25.0*  PLT 154  --   --  144*  --  196  LABPROT 21.2*  --   --   --  28.2* 33.7*  INR 1.9*  --   --   --  2.7* 3.4*  HEPARINUNFRC 0.34 0.32  --   --   --   --   CREATININE  --   --   --   --   --  7.20*     Estimated Creatinine Clearance: 8.4 mL/min (A) (by C-G formula based on SCr of 7.2 mg/dL (H)).   Medical History: Past Medical History:  Diagnosis Date   AAA (abdominal aortic aneurysm) (HCC)    Abdominal wall mass of left lower quadrant    Basal cell carcinoma    CAD (coronary artery disease) 2011   moderate   Colon polyp    DDD (degenerative disc disease)    Depression    Diabetes mellitus type II    no meds   GERD (gastroesophageal reflux disease)    Hyperlipidemia    Neuromuscular disorder (HCC)    Osteopenia    PAD (peripheral artery disease) (HCC)    Renal cell carcinoma    Restless leg syndrome    Sleep apnea    used a cpap yr ago-does not snore or use it now.    Medications:  Medications Prior to Admission  Medication Sig Dispense Refill Last Dose   acetaminophen (TYLENOL) 325 MG tablet Take 2 tablets (650 mg total) by mouth every 4 (four) hours as needed for headache or mild  pain.   Past Month   aspirin EC 81 MG tablet Take 1 tablet (81 mg total) by mouth daily. Swallow whole. 30 tablet 3 07/18/2022   atorvastatin (LIPITOR) 40 MG tablet Take 40 mg by mouth at bedtime.   07/17/2022   B Complex-C-Zn-Folic Acid (DIALYVITE 782-NFAO 15) 0.8 MG TABS Take 1 tablet by mouth 3 (three) times daily as needed (with meals).   Past Week   carvedilol (COREG) 6.25 MG tablet Take 1 tablet (6.25 mg total) by mouth 2 (two) times daily with a meal. 180 tablet 3 07/18/2022 at 0900   citalopram (CELEXA) 40 MG tablet Take 40 mg by mouth every evening.   07/17/2022   clonazePAM (KLONOPIN) 0.5 MG tablet Take 0.5 mg by mouth at bedtime.   07/17/2022   furosemide (LASIX) 40 MG tablet Take 1 tablet (40 mg total) by mouth 2 (two) times daily. 180 tablet 3 07/18/2022   hydrALAZINE (APRESOLINE) 25 MG tablet Take 1 tablet (25 mg total) by mouth every 8 (eight) hours. 270 tablet 3 07/18/2022   isosorbide  mononitrate (IMDUR) 30 MG 24 hr tablet Take 0.5 tablets (15 mg total) by mouth daily. 45 tablet 3 07/18/2022   lansoprazole (PREVACID) 15 MG capsule Take 15 mg by mouth every morning.   07/18/2022   multivitamin (RENA-VIT) TABS tablet Take 1 tablet by mouth at bedtime. 30 tablet 0 07/17/2022   Nutritional Supplements (NEPRO) LIQD Take 237 mLs by mouth 2 (two) times daily.   07/18/2022   QUEtiapine (SEROQUEL) 300 MG tablet Take 300 mg by mouth at bedtime.   07/17/2022   warfarin (COUMADIN) 5 MG tablet Take 1 tablet daily Monday, Wednesday, Friday, Saturday and Sunday and take half tablet daily on Tuesday and Thursday. (Patient taking differently: Take 2.5-5 mg by mouth See admin instructions. Take 2.5 mg (1/2 tablet) by mouth daily every day of the week except for Monday's. On Monday's take 5 mg (1 tablet).) 30 tablet 0 07/17/2022 at 2000   feeding supplement (ENSURE ENLIVE / ENSURE PLUS) LIQD Take 237 mLs by mouth 2 (two) times daily between meals. (Patient not taking: Reported on 07/18/2022) 14220 mL 0  Not Taking    Assessment: 71 yo male on warfarin PTA for recent LV thrombus. He is also noted with ESRD and s/p emergent HD. S/p PCI 10/20, heparin held d/t 1 episode of coffee-ground emesis, no reports of bleeding since. Pharmacy consulted to bridge heparin to warfarin.   Heparin off since yesterday. CBC stable, INR 1.9 > 2.7 > 3.4.  No issues with infusion or bleeding in last 24 hours noted.   Pharmacy asked to resume Coumadin.   Goal of Therapy:  INR 2-3 Heparin level 0.3-0.7 units/ml Monitor platelets by anticoagulation protocol: Yes   Plan:  Given quick jump in INR, will not give Coumadin dose tonight. Hopefully INR will trend down a bit tomorrow and Coumadin can be resumed.  Likely needs very low dose Coumadin.  Given lability of INRs, could consider switch to Eliquis for LV thrombus.  Evidence not quite as robust for LV thrombus, but given difficulty in managing INR may be preferred from a risk v. Benefit perspective.  [place] Crane, Roylene Reason, [facility] Clinical Pharmacist  07/26/2022 10:43 AM   [facility] pharmacy phone numbers are listed on [email or web address]

## 2022-07-27 ENCOUNTER — Inpatient Hospital Stay (HOSPITAL_COMMUNITY): Payer: No Typology Code available for payment source

## 2022-07-27 ENCOUNTER — Encounter (HOSPITAL_COMMUNITY): Payer: Self-pay | Admitting: Internal Medicine

## 2022-07-27 ENCOUNTER — Encounter: Payer: No Typology Code available for payment source | Admitting: Vascular Surgery

## 2022-07-27 ENCOUNTER — Encounter (HOSPITAL_COMMUNITY): Payer: No Typology Code available for payment source

## 2022-07-27 DIAGNOSIS — I25119 Atherosclerotic heart disease of native coronary artery with unspecified angina pectoris: Secondary | ICD-10-CM | POA: Diagnosis not present

## 2022-07-27 DIAGNOSIS — I5023 Acute on chronic systolic (congestive) heart failure: Secondary | ICD-10-CM | POA: Diagnosis not present

## 2022-07-27 DIAGNOSIS — E1169 Type 2 diabetes mellitus with other specified complication: Secondary | ICD-10-CM | POA: Diagnosis not present

## 2022-07-27 DIAGNOSIS — N170 Acute kidney failure with tubular necrosis: Secondary | ICD-10-CM | POA: Diagnosis not present

## 2022-07-27 DIAGNOSIS — K56609 Unspecified intestinal obstruction, unspecified as to partial versus complete obstruction: Secondary | ICD-10-CM | POA: Diagnosis not present

## 2022-07-27 LAB — BASIC METABOLIC PANEL
Anion gap: 20 — ABNORMAL HIGH (ref 5–15)
BUN: 52 mg/dL — ABNORMAL HIGH (ref 8–23)
CO2: 23 mmol/L (ref 22–32)
Calcium: 7.8 mg/dL — ABNORMAL LOW (ref 8.9–10.3)
Chloride: 93 mmol/L — ABNORMAL LOW (ref 98–111)
Creatinine, Ser: 5.11 mg/dL — ABNORMAL HIGH (ref 0.61–1.24)
GFR, Estimated: 11 mL/min — ABNORMAL LOW (ref >60)
Glucose, Bld: 112 mg/dL — ABNORMAL HIGH (ref 70–99)
Potassium: 4.9 mmol/L (ref 3.5–5.1)
Sodium: 136 mmol/L (ref 135–145)

## 2022-07-27 LAB — PROTIME-INR
INR: 4.7 (ref 0.8–1.2)
Prothrombin Time: 43.8 seconds — ABNORMAL HIGH (ref 11.4–15.2)

## 2022-07-27 LAB — CBC
HCT: 26.2 % — ABNORMAL LOW (ref 39.0–52.0)
Hemoglobin: 8.7 g/dL — ABNORMAL LOW (ref 13.0–17.0)
MCH: 30.6 pg (ref 26.0–34.0)
MCHC: 33.2 g/dL (ref 30.0–36.0)
MCV: 92.3 fL (ref 80.0–100.0)
Platelets: 213 10*3/uL (ref 150–400)
RBC: 2.84 MIL/uL — ABNORMAL LOW (ref 4.22–5.81)
RDW: 19.9 % — ABNORMAL HIGH (ref 11.5–15.5)
WBC: 9.9 10*3/uL (ref 4.0–10.5)
nRBC: 1.1 % — ABNORMAL HIGH (ref 0.0–0.2)

## 2022-07-27 LAB — GLUCOSE, CAPILLARY
Glucose-Capillary: 124 mg/dL — ABNORMAL HIGH (ref 70–99)
Glucose-Capillary: 138 mg/dL — ABNORMAL HIGH (ref 70–99)
Glucose-Capillary: 140 mg/dL — ABNORMAL HIGH (ref 70–99)
Glucose-Capillary: 136 mg/dL — ABNORMAL HIGH (ref 70–99)

## 2022-07-27 LAB — HEPATIC FUNCTION PANEL
ALT: 14 U/L (ref 0–44)
AST: 24 U/L (ref 15–41)
Albumin: 1.9 g/dL — ABNORMAL LOW (ref 3.5–5.0)
Alkaline Phosphatase: 66 U/L (ref 38–126)
Bilirubin, Direct: 0.3 mg/dL — ABNORMAL HIGH (ref 0.0–0.2)
Indirect Bilirubin: 1 mg/dL — ABNORMAL HIGH (ref 0.3–0.9)
Total Bilirubin: 1.3 mg/dL — ABNORMAL HIGH (ref 0.3–1.2)
Total Protein: 5.6 g/dL — ABNORMAL LOW (ref 6.5–8.1)

## 2022-07-27 LAB — PROCALCITONIN: Procalcitonin: 4.63 ng/mL

## 2022-07-27 MED ORDER — DIATRIZOATE MEGLUMINE & SODIUM 66-10 % PO SOLN
90.0000 mL | Freq: Once | ORAL | Status: AC
  Administered 2022-07-27: 90 mL via ORAL
  Filled 2022-07-27: qty 90

## 2022-07-27 MED ORDER — CHLORHEXIDINE GLUCONATE CLOTH 2 % EX PADS
6.0000 | MEDICATED_PAD | Freq: Every day | CUTANEOUS | Status: DC
  Administered 2022-07-28 – 2022-08-02 (×6): 6 via TOPICAL

## 2022-07-27 MED ORDER — LORAZEPAM 2 MG/ML IJ SOLN
0.5000 mg | Freq: Three times a day (TID) | INTRAMUSCULAR | Status: DC | PRN
  Administered 2022-07-28 – 2022-07-29 (×2): 0.5 mg via INTRAVENOUS
  Filled 2022-07-27 (×3): qty 1

## 2022-07-27 MED ORDER — VITAMIN K1 10 MG/ML IJ SOLN
2.0000 mg | Freq: Once | INTRAVENOUS | Status: AC
  Administered 2022-07-27: 2 mg via INTRAVENOUS
  Filled 2022-07-27: qty 0.2

## 2022-07-27 MED ORDER — DIATRIZOATE MEGLUMINE & SODIUM 66-10 % PO SOLN: 90.0000 mL | Freq: Once | ORAL | Status: DC

## 2022-07-27 NOTE — Progress Notes (Signed)
PT Cancellation Note  Patient Details Name: Allen Cortez MRN: [id number] DOB: Jul 14, 1951   Cancelled Treatment:    Reason Eval/Treat Not Completed: Patient declined, no reason specified. Pt declining to do anything with PT at this time, seemingly experiencing abdominal pain. Attempted to educate pt on importance of mobility to promote BM, but pt declining. Will plan to follow-up later as time permits.   Moishe Spice, PT, DPT Acute Rehabilitation Services  Office: [place] 07/27/2022, 9:45 AM

## 2022-07-27 NOTE — Consult Note (Signed)
Consult Note  Allen Cortez [birth year]  [id number].    Requesting MD: Dr. Marlowe Sax Chief Complaint/Reason for Consult: SBO  HPI:  71 y.o. male with medical history significant for ESRD, CAD, T2DM, GERD, PAD, CHF, HLD, LV thrombus on warfarin who presented to the ED with increased shortness of breath and was admitted to the hospitalist service.  He had recently undergone a cardiac cath and has been on HD since.  During admission he has been treated for AKI with ATN, acute on chronic CHF, and had drug-eluting cardiac stent placement 10/20.  He has developed coffee-ground emesis since admission and evaluated by GI for upper GI bleed. In setting of illness and need for DAPT/anticoagulation GI assessed he was not a candidate for EGD and signed off when signs of active bleeding resolved.  Patient has had ongoing abdominal pain and underwent CT this am (10/25) which showed SBO and general surgery asked to evaluate. Patient having significant abdominal pain this morning and history obtained from chart and family (wife and son) who are bedside. Wife states yesterday he had episode of bilious to clear emesis after dialysis and was complaining of significant abdominal pain and appeared bloated. After that and before CT scan he did have several bowel movements which were dark and liquid. Patient is able to tell me that he has felt nauseas intermittently this morning and having significant abdominal pain.   Substance use: Recently quit smoking Past Surgeries: Partial left nephrectomy - open, AAA repair, ventral hernia repair (2013)   ROS: Review of Systems  Unable to perform ROS: Acuity of condition    Family History  Problem Relation Age of Onset   Diabetes Mother    Aneurysm Mother        in brain   Cancer Father        lung   Depression Father    Cancer Sister        lung, breast   Pancreatitis Brother    Heart disease Sister        MI   Depression Sister     Past Medical  History:  Diagnosis Date   AAA (abdominal aortic aneurysm) ([place])    Abdominal wall mass of left lower quadrant    Basal cell carcinoma    CAD (coronary artery disease) 2011   moderate   Colon polyp    DDD (degenerative disc disease)    Depression    Diabetes mellitus type II    no meds   GERD (gastroesophageal reflux disease)    Hyperlipidemia    Neuromuscular disorder (HCC)    Osteopenia    PAD (peripheral artery disease) ([facility])    Renal cell carcinoma    Restless leg syndrome    Sleep apnea    used a cpap yr ago-does not snore or use it now.    Past Surgical History:  Procedure Laterality Date   ABDOMINAL AORTIC ANEURYSM REPAIR     CORONARY STENT INTERVENTION N/A 07/22/2022   Procedure: CORONARY STENT INTERVENTION;  Surgeon: Martinique, Peter M, MD;  Location: [facility];  Service: Cardiovascular;  Laterality: N/A;   HEMORRHOID SURGERY     HERNIA REPAIR  03/06/12   ventral hernia repair   IR FLUORO GUIDE CV LINE RIGHT  06/20/2022   IR US GUIDE VASC ACCESS RIGHT  06/20/2022   NEPHRECTOMY     left due to renal cell carcenoma   VENTRAL HERNIA REPAIR  03/06/2012   Procedure: HERNIA REPAIR  VENTRAL ADULT;  Surgeon: Joyice Faster. Cornett, MD;  Location: [facility];  Service: General;  Laterality: N/A;  excision of stitch granuloma    Social History:  reports that he quit smoking about 2 months ago. His smoking use included cigarettes. He smoked an average of 1 pack per day. He has never used smokeless tobacco. He reports current alcohol use. He reports that he does not use drugs.  Allergies:  Allergies  Allergen Reactions   Dilaudid [Hydromorphone Hcl] Other (See Comments)    hallucinations   Mirapex [Pramipexole Dihydrochloride] Other (See Comments)    Leg pain   Prednisone Other (See Comments)    irritability   Gabapentin     Other reaction(s): Tremor    Medications Prior to Admission  Medication Sig Dispense Refill   acetaminophen (TYLENOL) 325 MG tablet  Take 2 tablets (650 mg total) by mouth every 4 (four) hours as needed for headache or mild pain.     aspirin EC 81 MG tablet Take 1 tablet (81 mg total) by mouth daily. Swallow whole. 30 tablet 3   atorvastatin (LIPITOR) 40 MG tablet Take 40 mg by mouth at bedtime.     B Complex-C-Zn-Folic Acid (DIALYVITE 466-ZLDJ 15) 0.8 MG TABS Take 1 tablet by mouth 3 (three) times daily as needed (with meals).     carvedilol (COREG) 6.25 MG tablet Take 1 tablet (6.25 mg total) by mouth 2 (two) times daily with a meal. 180 tablet 3   citalopram (CELEXA) 40 MG tablet Take 40 mg by mouth every evening.     clonazePAM (KLONOPIN) 0.5 MG tablet Take 0.5 mg by mouth at bedtime.     furosemide (LASIX) 40 MG tablet Take 1 tablet (40 mg total) by mouth 2 (two) times daily. 180 tablet 3   hydrALAZINE (APRESOLINE) 25 MG tablet Take 1 tablet (25 mg total) by mouth every 8 (eight) hours. 270 tablet 3   isosorbide mononitrate (IMDUR) 30 MG 24 hr tablet Take 0.5 tablets (15 mg total) by mouth daily. 45 tablet 3   lansoprazole (PREVACID) 15 MG capsule Take 15 mg by mouth every morning.     multivitamin (RENA-VIT) TABS tablet Take 1 tablet by mouth at bedtime. 30 tablet 0   Nutritional Supplements (NEPRO) LIQD Take 237 mLs by mouth 2 (two) times daily.     QUEtiapine (SEROQUEL) 300 MG tablet Take 300 mg by mouth at bedtime.     warfarin (COUMADIN) 5 MG tablet Take 1 tablet daily Monday, Wednesday, Friday, Saturday and Sunday and take half tablet daily on Tuesday and Thursday. (Patient taking differently: Take 2.5-5 mg by mouth See admin instructions. Take 2.5 mg (1/2 tablet) by mouth daily every day of the week except for Monday's. On Monday's take 5 mg (1 tablet).) 30 tablet 0   feeding supplement (ENSURE ENLIVE / ENSURE PLUS) LIQD Take 237 mLs by mouth 2 (two) times daily between meals. (Patient not taking: Reported on 07/18/2022) 14220 mL 0    Blood pressure 101/65, pulse 95, temperature 97.8 F (36.6 C), temperature source  Oral, resp. rate 16, height '5\' 10"'$  (1.778 m), weight 62.2 kg, SpO2 96 %. Physical Exam: General: pleasant, WD, male who is laying in bed in NAD HEENT: head is normocephalic, atraumatic.  Sclera are noninjected.  Pupils equal and round. EOMs intact.  Ears and nose without any masses or lesions.  Mouth is pink and moist Heart: regular, rate, and rhythm.  Normal s1,s2. No obvious murmurs, gallops, or rubs noted.  Palpable radial and pedal pulses bilaterally Lungs: CTAB, no wheezes, rhonchi, or rales noted.  Respiratory effort nonlabored Abd: soft moderate distension with diffuse TTP greatest in the LLQ with voluntary guarding. MSK: all 4 extremities are symmetrical with no cyanosis, clubbing, or edema. Skin: warm and dry with no masses, lesions, or rashes Neuro: Cranial nerves 2-12 grossly intact, sensation is normal throughout Psych: A&Ox3 with an appropriate affect.    Results for orders placed or performed during the hospital encounter of 07/18/22 (from the past 48 hour(s))  Glucose, capillary     Status: Abnormal   Collection Time: 07/25/22  8:32 AM  Result Value Ref Range   Glucose-Capillary 128 (H) 70 - 99 mg/dL    Comment: Glucose reference range applies only to samples taken after fasting for at least 8 hours.  Glucose, capillary     Status: Abnormal   Collection Time: 07/25/22 12:18 PM  Result Value Ref Range   Glucose-Capillary 133 (H) 70 - 99 mg/dL    Comment: Glucose reference range applies only to samples taken after fasting for at least 8 hours.  Glucose, capillary     Status: Abnormal   Collection Time: 07/25/22  3:58 PM  Result Value Ref Range   Glucose-Capillary 120 (H) 70 - 99 mg/dL    Comment: Glucose reference range applies only to samples taken after fasting for at least 8 hours.  Glucose, capillary     Status: Abnormal   Collection Time: 07/25/22  8:07 PM  Result Value Ref Range   Glucose-Capillary 115 (H) 70 - 99 mg/dL    Comment: Glucose reference range applies  only to samples taken after fasting for at least 8 hours.  Protime-INR     Status: Abnormal   Collection Time: 07/26/22  2:44 AM  Result Value Ref Range   Prothrombin Time 33.7 (H) 11.4 - 15.2 seconds   INR 3.4 (H) 0.8 - 1.2    Comment: (NOTE) INR goal varies based on device and disease states. Performed at [facility], [place] [address] [place], [place] [address]   CBC     Status: Abnormal   Collection Time: 07/26/22  2:44 AM  Result Value Ref Range   WBC 5.7 4.0 - 10.5 K/uL   RBC 2.69 (L) 4.22 - 5.81 MIL/uL   Hemoglobin 8.2 (L) 13.0 - 17.0 g/dL   HCT 25.0 (L) 39.0 - 52.0 %   MCV 92.9 80.0 - 100.0 fL   MCH 30.5 26.0 - 34.0 pg   MCHC 32.8 30.0 - 36.0 g/dL   RDW 19.9 (H) 11.5 - 15.5 %   Platelets 196 150 - 400 K/uL   nRBC 0.0 0.0 - 0.2 %    Comment: Performed at [facility], [place] [address] [place], [place] [address]  Renal function panel     Status: Abnormal   Collection Time: 07/26/22  2:44 AM  Result Value Ref Range   Sodium 132 (L) 135 - 145 mmol/L   Potassium 5.5 (H) 3.5 - 5.1 mmol/L   Chloride 91 (L) 98 - 111 mmol/L   CO2 23 22 - 32 mmol/L   Glucose, Bld 107 (H) 70 - 99 mg/dL    Comment: Glucose reference range applies only to samples taken after fasting for at least 8 hours.   BUN 78 (H) 8 - 23 mg/dL   Creatinine, Ser 7.20 (H) 0.61 - 1.24 mg/dL   Calcium 8.1 (L) 8.9 - 10.3 mg/dL   Phosphorus 6.0 (H) 2.5 -  4.6 mg/dL   Albumin 2.1 (L) 3.5 - 5.0 g/dL   GFR, Estimated 7 (L) >60 mL/min    Comment: (NOTE) Calculated using the CKD-EPI Creatinine Equation (2021)    Anion gap 18 (H) 5 - 15    Comment: Performed at [facility] [address], [place], [place] [address]  Glucose, capillary     Status: Abnormal   Collection Time: 07/26/22  4:23 PM  Result Value Ref Range   Glucose-Capillary 123 (H) 70 - 99 mg/dL    Comment: Glucose reference range applies only to samples taken after fasting for at least 8 hours.  Glucose, capillary     Status: Abnormal    Collection Time: 07/26/22  9:16 PM  Result Value Ref Range   Glucose-Capillary 125 (H) 70 - 99 mg/dL    Comment: Glucose reference range applies only to samples taken after fasting for at least 8 hours.  Protime-INR     Status: Abnormal   Collection Time: 07/27/22  4:56 AM  Result Value Ref Range   Prothrombin Time 43.8 (H) 11.4 - 15.2 seconds   INR 4.7 (HH) 0.8 - 1.2    Comment: REPEATED TO VERIFY CRITICAL RESULT CALLED TO, READ BACK BY AND VERIFIED WITH: NOTIFIED CHANEL FLETCHER RN 07/27/22 '@0645'$  BY P.HENDERSON (NOTE) INR goal varies based on device and disease states. Performed at [facility], [place] [address] [place], [place] [address]   CBC     Status: Abnormal   Collection Time: 07/27/22  4:56 AM  Result Value Ref Range   WBC 9.9 4.0 - 10.5 K/uL   RBC 2.84 (L) 4.22 - 5.81 MIL/uL   Hemoglobin 8.7 (L) 13.0 - 17.0 g/dL   HCT 26.2 (L) 39.0 - 52.0 %   MCV 92.3 80.0 - 100.0 fL   MCH 30.6 26.0 - 34.0 pg   MCHC 33.2 30.0 - 36.0 g/dL   RDW 19.9 (H) 11.5 - 15.5 %   Platelets 213 150 - 400 K/uL   nRBC 1.1 (H) 0.0 - 0.2 %    Comment: Performed at [facility] [address], [place], [place] [address]  Basic metabolic panel     Status: Abnormal   Collection Time: 07/27/22  4:56 AM  Result Value Ref Range   Sodium 136 135 - 145 mmol/L   Potassium 4.9 3.5 - 5.1 mmol/L   Chloride 93 (L) 98 - 111 mmol/L   CO2 23 22 - 32 mmol/L   Glucose, Bld 112 (H) 70 - 99 mg/dL    Comment: Glucose reference range applies only to samples taken after fasting for at least 8 hours.   BUN 52 (H) 8 - 23 mg/dL   Creatinine, Ser 5.11 (H) 0.61 - 1.24 mg/dL   Calcium 7.8 (L) 8.9 - 10.3 mg/dL   GFR, Estimated 11 (L) >60 mL/min    Comment: (NOTE) Calculated using the CKD-EPI Creatinine Equation (2021)    Anion gap 20 (H) 5 - 15    Comment: Performed at [facility] [address], [place], [place] [address]  Hepatic function panel     Status: Abnormal   Collection Time: 07/27/22   4:56 AM  Result Value Ref Range   Total Protein 5.6 (L) 6.5 - 8.1 g/dL   Albumin 1.9 (L) 3.5 - 5.0 g/dL   AST 24 15 - 41 U/L   ALT 14 0 - 44 U/L   Alkaline Phosphatase 66 38 - 126 U/L  Total Bilirubin 1.3 (H) 0.3 - 1.2 mg/dL   Bilirubin, Direct 0.3 (H) 0.0 - 0.2 mg/dL   Indirect Bilirubin 1.0 (H) 0.3 - 0.9 mg/dL    Comment: Performed at [facility] [address], [place], [place] [address]  Procalcitonin - Baseline     Status: None   Collection Time: 07/27/22  4:56 AM  Result Value Ref Range   Procalcitonin 4.63 ng/mL    Comment:        Interpretation: PCT > 2 ng/mL: Systemic infection (sepsis) is likely, unless other causes are known. (NOTE)       Sepsis PCT Algorithm           Lower Respiratory Tract                                      Infection PCT Algorithm    ----------------------------     ----------------------------         PCT < 0.25 ng/mL                PCT < 0.10 ng/mL          Strongly encourage             Strongly discourage   discontinuation of antibiotics    initiation of antibiotics    ----------------------------     -----------------------------       PCT 0.25 - 0.50 ng/mL            PCT 0.10 - 0.25 ng/mL               OR       >80% decrease in PCT            Discourage initiation of                                            antibiotics      Encourage discontinuation           of antibiotics    ----------------------------     -----------------------------         PCT >= 0.50 ng/mL              PCT 0.26 - 0.50 ng/mL               AND       <80% decrease in PCT              Encourage initiation of                                             antibiotics       Encourage continuation           of antibiotics    ----------------------------     -----------------------------        PCT >= 0.50 ng/mL                  PCT > 0.50 ng/mL               AND         increase in PCT  Strongly encourage                                       initiation of antibiotics    Strongly encourage escalation           of antibiotics                                     -----------------------------                                           PCT <= 0.25 ng/mL                                                 OR                                        > 80% decrease in PCT                                      Discontinue / Do not initiate                                             antibiotics  Performed at [facility], [place] [address] [place], [place] [address]   Glucose, capillary     Status: Abnormal   Collection Time: 07/27/22  8:00 AM  Result Value Ref Range   Glucose-Capillary 124 (H) 70 - 99 mg/dL    Comment: Glucose reference range applies only to samples taken after fasting for at least 8 hours.   CT ABDOMEN PELVIS WO CONTRAST  Result Date: 07/27/2022 CLINICAL DATA:  Abdominal pain. History of renal cell carcinoma status post partial left nephrectomy. EXAM: CT ABDOMEN AND PELVIS WITHOUT CONTRAST TECHNIQUE: Multidetector CT imaging of the abdomen and pelvis was performed following the standard protocol without IV contrast. RADIATION DOSE REDUCTION: This exam was performed according to the departmental dose-optimization program which includes automated exposure control, adjustment of the mA and/or kV according to patient size and/or use of iterative reconstruction technique. COMPARISON:  CT AP 03/22/2022 and 04/07/2017. FINDINGS: Lower chest: There is a small right pleural effusion. Peripheral airspace consolidation within the right lower lobe is identified. Ground-glass attenuation and interstitial thickening concerning for mild edema. Cardiac enlargement and aortic atherosclerosis. Hepatobiliary: Cyst within right lobe of liver is unchanged measuring 0.9 cm. Gallbladder negative. No signs of bile duct dilatation. Pancreas: Unremarkable. No pancreatic ductal dilatation or surrounding inflammatory changes. Spleen: Normal in size  without focal abnormality. Adrenals/Urinary Tract: Normal adrenal glands. Bilateral, benign Bosniak class 1 and 2 kidney cysts measure up to 2.4 cm. No follow-up imaging recommended. Postoperative change identified along the lateral aspect of the left kidney compatible with prior partial nephrectomy. Calcification within the left retroperitoneum adjacent to the  lower pole of left kidney compatible with chronic fat necrosis. Unchanged. No nephrolithiasis or signs of hydronephrosis. There is high attenuation material within the urinary bladder which may reflect recently administered gadolinium contrast agent. No focal bladder abnormality noted. Stomach/Bowel: Stomach appears normal. Multiple dilated loops of small bowel are identified along with small bowel air-fluid levels. The small bowel loops measure up to 4.2 cm. Transition to decreased caliber distal small bowel noted in the right lower quadrant of the abdomen, image 68/4. Terminal ileum has a normal caliber. The appendix is visualized and appears normal. Normal appearance of the colon. Vascular/Lymphatic: Extensive aortic atherosclerosis. Infrarenal abdominal aortic aneurysm is identified measuring 3.4 cm and maximum AP diameter, image 34/4. No signs of abdominopelvic adenopathy. Reproductive: Prostate is unremarkable. Other: No free fluid or fluid collections. No signs of pneumoperitoneum. Musculoskeletal: No acute or significant osseous findings. IMPRESSION: 1. Examination is positive for small bowel obstruction with transition to decreased caliber distal small bowel in the right lower quadrant of the abdomen. 2. Small right pleural effusion with peripheral airspace consolidation within the right lower lobe. Correlate for signs of right lower lobe inflammation/infection 3. Infrarenal abdominal aortic aneurysm measuring 3.4 cm and maximum AP diameter. Recommend follow-up every 3 years. Aortic aneurysm NOS (ICD10-I71.9). 4.  Aortic Atherosclerosis  (ICD10-I70.0). These results will be called to the ordering clinician or representative by the Radiologist Assistant, and communication documented in the PACS or [employer]. Electronically Signed   By: Kerby Moors M.D.   On: 07/27/2022 05:46   DG Chest 1 View  Result Date: 07/26/2022 CLINICAL DATA:  Provided history: Dyspnea. EXAM: CHEST  1 VIEW COMPARISON:  Prior chest radiographs 07/25/2022 and earlier. FINDINGS: A portion of the left lateral costophrenic angle is excluded from the field of view. Unchanged position of a right-sided central venous catheter with tip terminating at the level of the upper right atrium. Cardiomegaly. Bilateral interstitial and ill-defined airspace opacities compatible with edema, similar to slightly decreased. Persistent small right pleural effusion. No evidence of pneumothorax. IMPRESSION: A portion of the left costophrenic angle is excluded from the field of view. Pulmonary edema, similar to slightly decreased from the prior examination of 07/25/2022. Persistent small right pleural effusion. Cardiomegaly, unchanged. Electronically Signed   By: Kellie Simmering D.O.   On: 07/26/2022 16:34      Assessment/Plan Upper GI bleed Coffee ground emesis 10/20. GI consulted and did not recommend EGD given acute problems and need for plavix and anticoagulation. Hemoglobin stable s/p 3 units prbc total. GI signed off when no further signs of acute bleeding SBO CT scan 10/25 showing small bowel obstruction with transition to decreased caliber distal small bowel in the right lower quadrant of the abdomen. Likely adhesive obstruction in the setting of prior abdominal surgeries. Cardiology recommends he remain on DAPT with ASA and Plavix for 12 months from stent placement.  He has been on warfarin in setting of LV thrombus with this now held as INR 4.7. Given his GIB do not recommend NGT placement at this time with risk for worsening bleeding. Patient is a very high risk surgical  candidate given his anticoagulation status, prior surgeries, and comorbidities and do not recommend surgical intervention at this time. Recommend bowel rest and will do small bowel obstruction protocol PO. Discussed with family and have placed palliative care consult for ongoing discussion.  FEN: NPO, IVF per primary ID: none VTE: plavix, ASA, warfarin baseline (now held)  ESRD on HD CAD  T2DM GERD PAD CHF HLD  LV thrombus on warfarin  I reviewed Consultant cardiology, GI notes, hospitalist notes, last 24 h vitals and pain scores, last 48 h intake and output, last 24 h labs and trends, and last 24 h imaging results.  This care required high  level of medical decision making.   Winferd Humphrey, [facility] Surgery 07/27/2022, 8:08 AM Please see Amion for pager number during day hours 7:00am-4:30pm

## 2022-07-27 NOTE — Progress Notes (Addendum)
Overnight progress note  CT abdomen pelvis without contrast showing: IMPRESSION: 1. Examination is positive for small bowel obstruction with transition to decreased caliber distal small bowel in the right lower quadrant of the abdomen. 2. Small right pleural effusion with peripheral airspace consolidation within the right lower lobe. Correlate for signs of right lower lobe inflammation/infection 3. Infrarenal abdominal aortic aneurysm measuring 3.4 cm and maximum AP diameter. Recommend follow-up every 3 years. Aortic aneurysm NOS (ICD10-I71.9). 4.  Aortic Atherosclerosis (ICD10-I70.0).  -Regarding SBO, spoke to nursing staff and was informed that patient continues to have abdominal pain but not actively vomiting.  No NG tube placed at this time due to risk of worsening upper GI bleed.  Discussed with Dr. [place] Silos, general surgery will consult.  Appreciate help.  -?Pneumonia, no fever.  No leukocytosis on labs done yesterday, today's morning labs pending.  Check procalcitonin.   -Outpatient follow-up for infrarenal abdominal aortic aneurysm  Addendum/update 07/27/2022 at 6:55 AM: Patient is on warfarin due to LV thrombus.  INR supratherapeutic this morning at 4.7.  No active bleeding at this time.  Hold warfarin.  Continue INR checks, pharmacy following.

## 2022-07-27 NOTE — Progress Notes (Addendum)
ACS RISK CALCULATOR USE:  Risk Calculator was used for discussion of surgery: Yes    Outcomes  Your Risk Average Risk Chance of Outcome [id number]%MRAJHHI Complication40.5%16.2%Above [id number]%     Any Complication40.1%20.0%Above [id number]%Pneumonia15.9%2.8%Above [id number] Complication11.5%1.6%Above [id number]%       Surgical Site Infection3.5%4.9%Below [id number]%Urinary Tract Infection1.7%1.6%[id number]%Venous Thromboembolism2.0%2.3%Below AverageThis outcome is inapplicable to patients with pre-op renal failure or dialysis.Renal [id number]%Readmission26.6%10.4%Above [id number]%Return to OR6.3%4.4%Above [id number]%Death38.6%3.9%Above [id number]%Discharge to Nursing or Rehab Facility33.3%10.2%Above [id number]%Sepsis17.9%2.1%Above Average

## 2022-07-27 NOTE — Progress Notes (Addendum)
PROGRESS NOTE    Allen Cortez  IWP:[id number] DOB: October 31, 1950 DOA: 07/18/2022 PCP: Gerome Sam, MD  71 yo male with the past medical history of CAD, systolic heart failure, LV thrombus, right renal cancer sp partial left nephrectomy, T2DM, ESRD and chronic hypoxemic respiratory failure who presented with dyspnea. At home he had progressive dyspnea and orthopnea despite hemodialysis.  Wife reports ongoing decline since starting dialysis in August. -In the ED he was noted to be volume overloaded, chest x-ray noted bilateral interstitial edema, pleural effusions 10/17 Patient had urgent HD with ultrafiltration  10/19 HD. Plan for multidisciplinary cardiovascular meeting  to decide if patient will benefit more from PCI than CABG.  10/20 PCI, followed by Coffee ground emesis and abdominal pain post procedure. 1 unit PRBC transfusion.  10/21 GI consulted with recommendations of IV pantoprazole, high risk for endoscopic procedure.  10/22 PRBC transfusion due to low hgb below 8.  10/23 no further active bleeding, resumed anticoagulation.  10/24 tolerated HD, GI signed off 10/24-25: Increasing abdominal pain, vomiting and distention, CT overnight concerning for SBO   Subjective: -Remains uncomfortable, continues to have abdominal pain  Assessment and Plan:  Acute on chronic systolic CHF -Echo with drop in EF to 30-35%, akinesis of LV apical segment wall, inferior wall, antral anterior wall and lateral wall, severe hypokinesis of left ventricle basal mid anterior wall, preserved RV systolic function -Volume managed with HD, -Continue carvedilol, hydralazine and Imdur as blood pressure tolerates  LV thrombus -Restarted on warfarin, INR supratherapeutic at 4.5, hold warfarin dosing -Extremely complicated scenario with ongoing bleeding, LV thrombus and high risk of thromboembolic event  Upper GI bleed Acute blood loss anemia -Prior reports of coffee-ground emesis and  melena -Hemoglobin chronically low, worsened this admission -Transfused 2 units of PRBC -Treated with IV PPI, gastroenterology was consulted, felt to be high risk for endoscopic evaluation -Unfortunately needs dual antiplatelet therapy in the setting of new stent and warfarin for LV thrombus, high risk of further decompensation  Abdominal pain SBO -Last evening developed increasing abdominal pain with vomiting, CT concerning for small bowel obstruction with transition in the right lower quadrant, history of prior abdominal surgeries, could be secondary to ideations -High risk scenario, held off on NG tube with recent upper GI bleed, fortunately without significant vomiting today -request general surgery evaluation, options are limited -Discussed with wife about potentially getting palliative care involved given multisystem decompensations at this time  Acute kidney injury with acute tubular necrosis (ATN) (HCC) ESRD on HD. -Started hemodialysis in August -Admitted with volume overload, volume managed with HD, UF limited by cramping  Acute on chronic hypoxemic respiratory failure due to acute pulmonary edema Oxygenation has improved  -Wean O2 as tolerated  CAD Underwent left heart cath, proximal LAD to mid LAD was 100% stenosed, distal circumflex with 80% stenosis, successful PCI, stenting with DES to mid circumflex, recommendations to continue warfarin, aspirin for 1 month and Plavix for 6 months -Continue aspirin/Plavix/carvedilol statin -Then complicated by GI bleeding and now SBO  Type 2 diabetes mellitus with hyperlipidemia (HCC) -CBGs are stable, hold insulin therapy, oral intake is poor  GERD (gastroesophageal reflux disease) Continue PPI  PAD (peripheral artery disease) ([place]) Continue with statin therapy.   Severe protein calorie malnutrition -Albumin is 1.9, no history of liver disease, supplements as tolerated  Goals of care: Extensive list of medical problems in 70/M  who started dialysis in August and has been subsequently declining, now admitted with worsening cardiomyopathy, LV thrombus, CAD requiring  PCI and stenting on dual antiplatelet therapy and warfarin complicated by GI bleed, anemia and now SBO -Prognosis appears poor, severely malnourished his albumin is 1.9, will need palliative care evaluation, discussed prognosis with wife this morning  DVT prophylaxis: SCDs Code Status: DNR Family Communication: Wife at bedside Disposition Plan:   Consultants: Nephrology, cardiology, palliative care, general surgery   Procedures: Cardiac MRI on 07/15/22 IMPRESSION: 1. Subendocardial LGE consistent with prior infarct in mid anterior/anteroseptal, apical anterior/septal/inferior/lateral walls, and apex. LGE is greater than 50% transmural suggesting nonviability in apical anterior/septal/inferior/lateral walls and apex. LGE <50% transmural in mid anterior wall but wall thickness <4.61m suggests nonviability. Findings are consistent with nonviability in mid to distal LAD territory 2. Severe LV dilatation with severe systolic dysfunction (EF 262%. Thinning/akinesis of mid to apical anterior/anteroseptal walls and apical inferior/lateral walls. Aneurysm of apex 3.  LV apical thrombus measures 168mx 1m52m.  Normal RV size and systolic function (EF 48%22%. Diffuse pulmonary opacities and bilateral pleural effusions (moderate on right, small on left), consider CT chest for further Evaluation.   Echo in 06/2022  1. 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  Left ventricular ejection fraction, by estimation, is 30-35%. The left  ventricle is abnormal. The left  ventricle has focal regional wall motion abnormalities. The left  ventricular internal cavity size was mildly dilated. There is akinesis of  the left ventricular, apical septal wall, inferior wall,  anterior wall and  lateral wall. There is akinesis of the  left ventricular, entire apical segment. There is severe hypokinesis of  the left ventricular, basal-mid anterior wall. A falst tendon is present  in the mid LV cavity.   Cath: 07/22/22     Prox LAD to Mid LAD lesion is 100% stenosed.   Dist Cx lesion is 80% stenosed.   A drug-eluting stent was successfully placed using a SYNERGY XD [id number].   Post intervention, there is a 0% residual stenosis.   Successful PCI of the mid LCx with DES x 1     Plan: DAPT with ASA for one month, plavix for 12 months. OK to resume Coumadin tomorrow if no bleeding complications.     Antimicrobials:    Objective: Vitals:   07/26/22 1342 07/26/22 1810 07/26/22 2118 07/27/22 0554  BP: 123/82 130/73 101/72 101/65  Pulse: 95 (!) 102 92 95  Resp:   14 16  Temp:   97.8 F (36.6 C) 97.8 F (36.6 C)  TempSrc:   Oral Oral  SpO2: 90% 97% 99% 96%  Weight:      Height:        Intake/Output Summary (Last 24 hours) at 07/27/2022 1129 Last data filed at 07/26/2022 2245 Gross per 24 hour  Intake 240 ml  Output 4 ml  Net 236 ml   Filed Weights   07/23/22 1217 07/26/22 0806 07/26/22 1214  Weight: 64.2 kg 63.7 kg 62.2 kg    Examination:  General exam: Frail chronically ill male appears older than stated age, oriented to self and place Respiratory system: Decreased breath sounds to bases Cardiovascular system: S1 & S2 heard, RRR.  Abd: Firm distended, diffuse tenderness, decreased bowel sounds Extremities: no edema Skin: No rashes Psychiatry: Flat    Data Reviewed:   CBC: Recent Labs  Lab 07/23/22 0223 07/24/22 0145 07/24/22 1701 07/25/22 0213 07/26/22 0244 07/27/22 0456  WBC 11.3* 14.8*  --  8.3 5.7 9.9  HGB 8.8* 7.2* 8.1* 7.9* 8.2* 8.7*  HCT 26.5* 21.9* 24.8* 23.9* 25.0* 26.2*  MCV 96.4 97.3  --  93.4 92.9 92.3  PLT 173 154  --  144* 196 673   Basic Metabolic Panel: Recent Labs  Lab 07/21/22 0144 07/22/22 0541  07/23/22 0223 07/26/22 0244 07/27/22 0456  NA 135 135 137 132* 136  K 3.9 3.7 4.7 5.5* 4.9  CL 95* 95* 91* 91* 93*  CO2 '28 28 27 23 23  '$ GLUCOSE 114* 94 125* 107* 112*  BUN 33* 20 40* 78* 52*  CREATININE 4.94* 3.97* 5.53* 7.20* 5.11*  CALCIUM 8.4* 8.4* 8.7* 8.1* 7.8*  PHOS  --   --   --  6.0*  --    GFR: Estimated Creatinine Clearance: 11.5 mL/min (A) (by C-G formula based on SCr of 5.11 mg/dL (H)). Liver Function Tests: Recent Labs  Lab 07/26/22 0244 07/27/22 0456  AST  --  24  ALT  --  14  ALKPHOS  --  66  BILITOT  --  1.3*  PROT  --  5.6*  ALBUMIN 2.1* 1.9*   No results for input(s): "LIPASE", "AMYLASE" in the last 168 hours. No results for input(s): "AMMONIA" in the last 168 hours. Coagulation Profile: Recent Labs  Lab 07/23/22 0223 07/24/22 0145 07/25/22 0729 07/26/22 0244 07/27/22 0456  INR 1.3* 1.9* 2.7* 3.4* 4.7*   Cardiac Enzymes: No results for input(s): "CKTOTAL", "CKMB", "CKMBINDEX", "TROPONINI" in the last 168 hours. BNP (last 3 results) No results for input(s): "PROBNP" in the last 8760 hours. HbA1C: No results for input(s): "HGBA1C" in the last 72 hours. CBG: Recent Labs  Lab 07/25/22 1558 07/25/22 2007 07/26/22 1623 07/26/22 2116 07/27/22 0800  GLUCAP 120* 115* 123* 125* 124*   Lipid Profile: No results for input(s): "CHOL", "HDL", "LDLCALC", "TRIG", "CHOLHDL", "LDLDIRECT" in the last 72 hours. Thyroid Function Tests: No results for input(s): "TSH", "T4TOTAL", "FREET4", "T3FREE", "THYROIDAB" in the last 72 hours. Anemia Panel: No results for input(s): "VITAMINB12", "FOLATE", "FERRITIN", "TIBC", "IRON", "RETICCTPCT" in the last 72 hours. Urine analysis:    Component Value Date/Time   COLORURINE YELLOW 06/12/2022 1720   APPEARANCEUR CLEAR 06/12/2022 1720   LABSPEC 1.010 06/12/2022 1720   PHURINE 5.0 06/12/2022 1720   GLUCOSEU NEGATIVE 06/12/2022 1720   HGBUR SMALL (A) 06/12/2022 1720   BILIRUBINUR NEGATIVE 06/12/2022 1720    KETONESUR NEGATIVE 06/12/2022 1720   PROTEINUR 30 (A) 06/12/2022 1720   UROBILINOGEN 0.2 12/23/2009 0857   NITRITE NEGATIVE 06/12/2022 1720   LEUKOCYTESUR NEGATIVE 06/12/2022 1720   Sepsis Labs: '@LABRCNTIP'$ (procalcitonin:4,lacticidven:4)  ) Recent Results (from the past 240 hour(s))  Resp Panel by RT-PCR (Flu A&B, Covid) Anterior Nasal Swab     Status: None   Collection Time: 07/18/22  4:28 PM   Specimen: Anterior Nasal Swab  Result Value Ref Range Status   SARS Coronavirus 2 by RT PCR NEGATIVE NEGATIVE Final    Comment: (NOTE) SARS-CoV-2 target nucleic acids are NOT DETECTED.  The SARS-CoV-2 RNA is generally detectable in upper respiratory specimens during the acute phase of infection. The lowest concentration of SARS-CoV-2 viral copies this assay can detect is 138 copies/mL. A negative result does not preclude SARS-Cov-2 infection and should not be used as the sole basis for treatment or other patient management decisions. A negative result may occur with  improper specimen collection/handling, submission of specimen other than nasopharyngeal swab, presence of viral mutation(s) within the areas targeted by this assay, and inadequate number  of viral copies(<138 copies/mL). A negative result must be combined with clinical observations, patient history, and epidemiological information. The expected result is Negative.  Fact Sheet for Patients:  EntrepreneurPulse.com.au  Fact Sheet for Healthcare Providers:  IncredibleEmployment.be  This test is no t yet approved or cleared by the [place] FDA and  has been authorized for detection and/or diagnosis of SARS-CoV-2 by FDA under an Emergency Use Authorization (EUA). This EUA will remain  in effect (meaning this test can be used) for the duration of the COVID-19 declaration under Section 564(b)(1) of the Act, 21 U.S.C.section 360bbb-3(b)(1), unless the authorization is terminated  or revoked  sooner.       Influenza A by PCR NEGATIVE NEGATIVE Final   Influenza B by PCR NEGATIVE NEGATIVE Final    Comment: (NOTE) The Xpert Xpress SARS-CoV-2/FLU/RSV plus assay is intended as an aid in the diagnosis of influenza from Nasopharyngeal swab specimens and should not be used as a sole basis for treatment. Nasal washings and aspirates are unacceptable for Xpert Xpress SARS-CoV-2/FLU/RSV testing.  Fact Sheet for Patients: EntrepreneurPulse.com.au  Fact Sheet for Healthcare Providers: IncredibleEmployment.be  This test is not yet approved or cleared by the [place] FDA and has been authorized for detection and/or diagnosis of SARS-CoV-2 by FDA under an Emergency Use Authorization (EUA). This EUA will remain in effect (meaning this test can be used) for the duration of the COVID-19 declaration under Section 564(b)(1) of the Act, 21 U.S.C. section 360bbb-3(b)(1), unless the authorization is terminated or revoked.  Performed at [facility], [place] [address], [place], [place] [address]      Radiology Studies: CT ABDOMEN PELVIS WO CONTRAST  Result Date: 07/27/2022 CLINICAL DATA:  Abdominal pain. History of renal cell carcinoma status post partial left nephrectomy. EXAM: CT ABDOMEN AND PELVIS WITHOUT CONTRAST TECHNIQUE: Multidetector CT imaging of the abdomen and pelvis was performed following the standard protocol without IV contrast. RADIATION DOSE REDUCTION: This exam was performed according to the departmental dose-optimization program which includes automated exposure control, adjustment of the mA and/or kV according to patient size and/or use of iterative reconstruction technique. COMPARISON:  CT AP 03/22/2022 and 04/07/2017. FINDINGS: Lower chest: There is a small right pleural effusion. Peripheral airspace consolidation within the right lower lobe is identified. Ground-glass attenuation and interstitial thickening concerning for mild  edema. Cardiac enlargement and aortic atherosclerosis. Hepatobiliary: Cyst within right lobe of liver is unchanged measuring 0.9 cm. Gallbladder negative. No signs of bile duct dilatation. Pancreas: Unremarkable. No pancreatic ductal dilatation or surrounding inflammatory changes. Spleen: Normal in size without focal abnormality. Adrenals/Urinary Tract: Normal adrenal glands. Bilateral, benign Bosniak class 1 and 2 kidney cysts measure up to 2.4 cm. No follow-up imaging recommended. Postoperative change identified along the lateral aspect of the left kidney compatible with prior partial nephrectomy. Calcification within the left retroperitoneum adjacent to the lower pole of left kidney compatible with chronic fat necrosis. Unchanged. No nephrolithiasis or signs of hydronephrosis. There is high attenuation material within the urinary bladder which may reflect recently administered gadolinium contrast agent. No focal bladder abnormality noted. Stomach/Bowel: Stomach appears normal. Multiple dilated loops of small bowel are identified along with small bowel air-fluid levels. The small bowel loops measure up to 4.2 cm. Transition to decreased caliber distal small bowel noted in the right lower quadrant of the abdomen, image 68/4. Terminal ileum has a normal caliber. The appendix is visualized and appears normal. Normal appearance of the colon. Vascular/Lymphatic: Extensive aortic atherosclerosis. Infrarenal abdominal aortic  aneurysm is identified measuring 3.4 cm and maximum AP diameter, image 34/4. No signs of abdominopelvic adenopathy. Reproductive: Prostate is unremarkable. Other: No free fluid or fluid collections. No signs of pneumoperitoneum. Musculoskeletal: No acute or significant osseous findings. IMPRESSION: 1. Examination is positive for small bowel obstruction with transition to decreased caliber distal small bowel in the right lower quadrant of the abdomen. 2. Small right pleural effusion with peripheral  airspace consolidation within the right lower lobe. Correlate for signs of right lower lobe inflammation/infection 3. Infrarenal abdominal aortic aneurysm measuring 3.4 cm and maximum AP diameter. Recommend follow-up every 3 years. Aortic aneurysm NOS (ICD10-I71.9). 4.  Aortic Atherosclerosis (ICD10-I70.0). These results will be called to the ordering clinician or representative by the Radiologist Assistant, and communication documented in the PACS or [employer]. Electronically Signed   By: Kerby Moors M.D.   On: 07/27/2022 05:46   DG Chest 1 View  Result Date: 07/26/2022 CLINICAL DATA:  Provided history: Dyspnea. EXAM: CHEST  1 VIEW COMPARISON:  Prior chest radiographs 07/25/2022 and earlier. FINDINGS: A portion of the left lateral costophrenic angle is excluded from the field of view. Unchanged position of a right-sided central venous catheter with tip terminating at the level of the upper right atrium. Cardiomegaly. Bilateral interstitial and ill-defined airspace opacities compatible with edema, similar to slightly decreased. Persistent small right pleural effusion. No evidence of pneumothorax. IMPRESSION: A portion of the left costophrenic angle is excluded from the field of view. Pulmonary edema, similar to slightly decreased from the prior examination of 07/25/2022. Persistent small right pleural effusion. Cardiomegaly, unchanged. Electronically Signed   By: Kellie Simmering D.O.   On: 07/26/2022 16:34     Scheduled Meds:  aspirin EC  81 mg Oral Daily   atorvastatin  40 mg Oral QHS   bisacodyl  10 mg Oral Q1200   carvedilol  6.25 mg Oral BID WC   Chlorhexidine Gluconate Cloth  6 each Topical Q0600   citalopram  20 mg Oral QHS   clonazePAM  0.5 mg Oral QHS   clopidogrel  75 mg Oral Q breakfast   darbepoetin (ARANESP) injection - DIALYSIS  150 mcg Intravenous Q Sat-HD   diatrizoate meglumine-sodium  90 mL Oral Once   docusate sodium  100 mg Oral BID   feeding supplement (NEPRO CARB  STEADY)  237 mL Oral TID BM   guaiFENesin  600 mg Oral BID   hydrALAZINE  25 mg Oral BID   insulin aspart  0-6 Units Subcutaneous TID AC & HS   isosorbide dinitrate  20 mg Oral BID   multivitamin  1 tablet Oral QHS   pantoprazole (PROTONIX) IV  40 mg Intravenous Q12H   polyethylene glycol  17 g Oral BID   QUEtiapine  300 mg Oral QHS   sodium chloride flush  3 mL Intravenous Q12H   Continuous Infusions:  sodium chloride     phytonadione (VITAMIN K) 2 mg in dextrose 5 % 50 mL IVPB     promethazine (PHENERGAN) injection (IM or IVPB) Stopped (07/23/22 1026)     LOS: 9 days    Time spent: 26mn  PDomenic Polite MD [facility]   07/27/2022, 11:29 AM

## 2022-07-27 NOTE — Progress Notes (Signed)
Critical INR 4.7 Notified [place] 10/25 0063

## 2022-07-27 NOTE — Progress Notes (Addendum)
ANTICOAGULATION CONSULT NOTE  Pharmacy Consult for Coumadin Indication: LV thrombus  Allergies  Allergen Reactions   Dilaudid [Hydromorphone Hcl] Other (See Comments)    hallucinations   Mirapex [Pramipexole Dihydrochloride] Other (See Comments)    Leg pain   Prednisone Other (See Comments)    irritability   Gabapentin     Other reaction(s): Tremor    Patient Measurements: Height: '5\' 10"'$  (177.8 cm) Weight: 62.2 kg (137 lb 2 oz) IBW/kg (Calculated) : 73  Vital Signs: Temp: 97.8 F (36.6 C) (10/25 0554) Temp Source: Oral (10/25 0554) BP: 101/65 (10/25 0554) Pulse Rate: 95 (10/25 0554)  Labs: Recent Labs    07/24/22 1056 07/24/22 1701 07/25/22 0213 07/25/22 0729 07/26/22 0244 07/27/22 0456  HGB  --    < > 7.9*  --  8.2* 8.7*  HCT  --    < > 23.9*  --  25.0* 26.2*  PLT  --   --  144*  --  196 213  LABPROT  --   --   --  28.2* 33.7* 43.8*  INR  --   --   --  2.7* 3.4* 4.7*  HEPARINUNFRC 0.32  --   --   --   --   --   CREATININE  --   --   --   --  7.20* 5.11*   < > = values in this interval not displayed.     Estimated Creatinine Clearance: 11.5 mL/min (A) (by C-G formula based on SCr of 5.11 mg/dL (H)).   Medical History: Past Medical History:  Diagnosis Date   AAA (abdominal aortic aneurysm) (HCC)    Abdominal wall mass of left lower quadrant    Basal cell carcinoma    CAD (coronary artery disease) 2011   moderate   Colon polyp    DDD (degenerative disc disease)    Depression    Diabetes mellitus type II    no meds   GERD (gastroesophageal reflux disease)    Hyperlipidemia    Neuromuscular disorder (HCC)    Osteopenia    PAD (peripheral artery disease) (HCC)    Renal cell carcinoma    Restless leg syndrome    Sleep apnea    used a cpap yr ago-does not snore or use it now.   Assessment: 71 yo male on warfarin PTA for recent LV thrombus. He is also noted with ESRD and s/p emergent HD. S/p PCI 10/20, heparin held d/t 1 episode of coffee-ground  emesis, no reports of bleeding since. Pharmacy consulted to bridge heparin to warfarin.   Heparin off with elevated INR. CBC stable, INR 1.9 > 2.7 > 3.4>4.7.  No bleeding issues noted.   Continue to hold warfarin. Given trend up, discussed with team and will give low dose vitamin k this morning to hope plateau INR.    Goal of Therapy:  INR 2-3 Monitor platelets by anticoagulation protocol: Yes   Plan:  Hold warfarin until INR down within range.  Low dose vitamin  Erin Hearing PharmD., BCPS Clinical Pharmacist 07/27/2022 7:55 AM

## 2022-07-27 NOTE — Progress Notes (Addendum)
Rounding Note    Patient Name: Allen Cortez Date of Encounter: 07/27/2022  [place] Cardiologist: Shelva Majestic, MD   Subjective   Laying in bed. Ongoing abd pain.   Inpatient Medications    Scheduled Meds:  aspirin EC  81 mg Oral Daily   atorvastatin  40 mg Oral QHS   bisacodyl  10 mg Oral Q1200   carvedilol  6.25 mg Oral BID WC   Chlorhexidine Gluconate Cloth  6 each Topical Q0600   citalopram  20 mg Oral QHS   clonazePAM  0.5 mg Oral QHS   clopidogrel  75 mg Oral Q breakfast   darbepoetin (ARANESP) injection - DIALYSIS  150 mcg Intravenous Q Sat-HD   docusate sodium  100 mg Oral BID   feeding supplement (NEPRO CARB STEADY)  237 mL Oral TID BM   guaiFENesin  600 mg Oral BID   hydrALAZINE  25 mg Oral BID   insulin aspart  0-6 Units Subcutaneous TID AC & HS   isosorbide dinitrate  20 mg Oral BID   multivitamin  1 tablet Oral QHS   pantoprazole (PROTONIX) IV  40 mg Intravenous Q12H   polyethylene glycol  17 g Oral BID   QUEtiapine  300 mg Oral QHS   sodium chloride flush  3 mL Intravenous Q12H   Continuous Infusions:  sodium chloride     promethazine (PHENERGAN) injection (IM or IVPB) Stopped (07/23/22 1026)   PRN Meds: sodium chloride, acetaminophen **OR** acetaminophen, albuterol, HYDROmorphone (DILAUDID) injection, ondansetron (ZOFRAN) IV, mouth rinse, promethazine (PHENERGAN) injection (IM or IVPB), sodium chloride flush   Vital Signs    Vitals:   07/26/22 1342 07/26/22 1810 07/26/22 2118 07/27/22 0554  BP: 123/82 130/73 101/72 101/65  Pulse: 95 (!) 102 92 95  Resp:   14 16  Temp:   97.8 F (36.6 C) 97.8 F (36.6 C)  TempSrc:   Oral Oral  SpO2: 90% 97% 99% 96%  Weight:      Height:        Intake/Output Summary (Last 24 hours) at 07/27/2022 0911 Last data filed at 07/26/2022 2245 Gross per 24 hour  Intake 240 ml  Output 4 ml  Net 236 ml      07/26/2022   12:14 PM 07/26/2022    8:06 AM 07/23/2022   12:17 PM  Last 3 Weights   Weight (lbs) 137 lb 2 oz 140 lb 6.9 oz 141 lb 8.6 oz  Weight (kg) 62.2 kg 63.7 kg 64.2 kg      Telemetry    Sinus Rhythm rates 90 - Personally Reviewed  ECG    No new tracing  Physical Exam   GEN: Frail older male, laying in bed.  Neck: No JVD Cardiac: RRR, no murmurs, rubs, or gallops.  Respiratory: Diminished in bases GI: Distended, decreased BS MS: No edema; No deformity. Neuro:  Nonfocal  Psych: Normal affect   Labs    High Sensitivity Troponin:   Recent Labs  Lab 07/18/22 1653 07/18/22 1845 07/18/22 2045 07/19/22 0454  TROPONINIHS 266* 271* 256* 288*     Chemistry Recent Labs  Lab 07/20/22 0948 07/21/22 0144 07/23/22 0223 07/26/22 0244 07/27/22 0456  NA 135   < > 137 132* 136  K 4.3   < > 4.7 5.5* 4.9  CL 97*   < > 91* 91* 93*  CO2 29   < > '27 23 23  '$ GLUCOSE 104*   < > 125* 107* 112*  BUN 20   < >  40* 78* 52*  CREATININE 3.83*   < > 5.53* 7.20* 5.11*  CALCIUM 8.5*   < > 8.7* 8.1* 7.8*  PROT  --   --   --   --  5.6*  ALBUMIN 2.6*  --   --  2.1* 1.9*  AST  --   --   --   --  24  ALT  --   --   --   --  14  ALKPHOS  --   --   --   --  66  BILITOT  --   --   --   --  1.3*  GFRNONAA 16*   < > 10* 7* 11*  ANIONGAP 9   < > 19* 18* 20*   < > = values in this interval not displayed.    Lipids No results for input(s): "CHOL", "TRIG", "HDL", "LABVLDL", "LDLCALC", "CHOLHDL" in the last 168 hours.  Hematology Recent Labs  Lab 07/25/22 0213 07/26/22 0244 07/27/22 0456  WBC 8.3 5.7 9.9  RBC 2.56* 2.69* 2.84*  HGB 7.9* 8.2* 8.7*  HCT 23.9* 25.0* 26.2*  MCV 93.4 92.9 92.3  MCH 30.9 30.5 30.6  MCHC 33.1 32.8 33.2  RDW 19.9* 19.9* 19.9*  PLT 144* 196 213   Thyroid No results for input(s): "TSH", "FREET4" in the last 168 hours.  BNPNo results for input(s): "BNP", "PROBNP" in the last 168 hours.  DDimer No results for input(s): "DDIMER" in the last 168 hours.   Radiology    CT ABDOMEN PELVIS WO CONTRAST  Result Date: 07/27/2022 CLINICAL DATA:   Abdominal pain. History of renal cell carcinoma status post partial left nephrectomy. EXAM: CT ABDOMEN AND PELVIS WITHOUT CONTRAST TECHNIQUE: Multidetector CT imaging of the abdomen and pelvis was performed following the standard protocol without IV contrast. RADIATION DOSE REDUCTION: This exam was performed according to the departmental dose-optimization program which includes automated exposure control, adjustment of the mA and/or kV according to patient size and/or use of iterative reconstruction technique. COMPARISON:  CT AP 03/22/2022 and 04/07/2017. FINDINGS: Lower chest: There is a small right pleural effusion. Peripheral airspace consolidation within the right lower lobe is identified. Ground-glass attenuation and interstitial thickening concerning for mild edema. Cardiac enlargement and aortic atherosclerosis. Hepatobiliary: Cyst within right lobe of liver is unchanged measuring 0.9 cm. Gallbladder negative. No signs of bile duct dilatation. Pancreas: Unremarkable. No pancreatic ductal dilatation or surrounding inflammatory changes. Spleen: Normal in size without focal abnormality. Adrenals/Urinary Tract: Normal adrenal glands. Bilateral, benign Bosniak class 1 and 2 kidney cysts measure up to 2.4 cm. No follow-up imaging recommended. Postoperative change identified along the lateral aspect of the left kidney compatible with prior partial nephrectomy. Calcification within the left retroperitoneum adjacent to the lower pole of left kidney compatible with chronic fat necrosis. Unchanged. No nephrolithiasis or signs of hydronephrosis. There is high attenuation material within the urinary bladder which may reflect recently administered gadolinium contrast agent. No focal bladder abnormality noted. Stomach/Bowel: Stomach appears normal. Multiple dilated loops of small bowel are identified along with small bowel air-fluid levels. The small bowel loops measure up to 4.2 cm. Transition to decreased caliber distal  small bowel noted in the right lower quadrant of the abdomen, image 68/4. Terminal ileum has a normal caliber. The appendix is visualized and appears normal. Normal appearance of the colon. Vascular/Lymphatic: Extensive aortic atherosclerosis. Infrarenal abdominal aortic aneurysm is identified measuring 3.4 cm and maximum AP diameter, image 34/4. No signs of abdominopelvic adenopathy. Reproductive: Prostate is unremarkable.  Other: No free fluid or fluid collections. No signs of pneumoperitoneum. Musculoskeletal: No acute or significant osseous findings. IMPRESSION: 1. Examination is positive for small bowel obstruction with transition to decreased caliber distal small bowel in the right lower quadrant of the abdomen. 2. Small right pleural effusion with peripheral airspace consolidation within the right lower lobe. Correlate for signs of right lower lobe inflammation/infection 3. Infrarenal abdominal aortic aneurysm measuring 3.4 cm and maximum AP diameter. Recommend follow-up every 3 years. Aortic aneurysm NOS (ICD10-I71.9). 4.  Aortic Atherosclerosis (ICD10-I70.0). These results will be called to the ordering clinician or representative by the Radiologist Assistant, and communication documented in the PACS or [employer]. Electronically Signed   By: Kerby Moors M.D.   On: 07/27/2022 05:46   DG Chest 1 View  Result Date: 07/26/2022 CLINICAL DATA:  Provided history: Dyspnea. EXAM: CHEST  1 VIEW COMPARISON:  Prior chest radiographs 07/25/2022 and earlier. FINDINGS: A portion of the left lateral costophrenic angle is excluded from the field of view. Unchanged position of a right-sided central venous catheter with tip terminating at the level of the upper right atrium. Cardiomegaly. Bilateral interstitial and ill-defined airspace opacities compatible with edema, similar to slightly decreased. Persistent small right pleural effusion. No evidence of pneumothorax. IMPRESSION: A portion of the left  costophrenic angle is excluded from the field of view. Pulmonary edema, similar to slightly decreased from the prior examination of 07/25/2022. Persistent small right pleural effusion. Cardiomegaly, unchanged. Electronically Signed   By: Kellie Simmering D.O.   On: 07/26/2022 16:34    Cardiac Studies   Cardiac MRI on 07/15/22 IMPRESSION: 1. Subendocardial LGE consistent with prior infarct in mid anterior/anteroseptal, apical anterior/septal/inferior/lateral walls, and apex. LGE is greater than 50% transmural suggesting nonviability in apical anterior/septal/inferior/lateral walls and apex. LGE <50% transmural in mid anterior wall but wall thickness <4.45m suggests nonviability. Findings are consistent with nonviability in mid to distal LAD territory 2. Severe LV dilatation with severe systolic dysfunction (EF 264%. Thinning/akinesis of mid to apical anterior/anteroseptal walls and apical inferior/lateral walls. Aneurysm of apex 3.  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  Normal RV size and systolic function (EF 48%40%. Diffuse pulmonary opacities and bilateral pleural effusions (moderate on right, small on left), consider CT chest for further Evaluation.   Echo in 06/2022  1. 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  Left ventricular ejection fraction, by estimation, is 30-35%. The left  ventricle is abnormal. The left  ventricle has focal regional wall motion abnormalities. The left  ventricular internal cavity size was mildly dilated. There is akinesis of  the left ventricular, apical septal wall, inferior wall, anterior wall and  lateral wall. There is akinesis of the  left ventricular, entire apical segment. There is severe hypokinesis of  the left ventricular, basal-mid anterior wall. A falst tendon is present  in the mid LV cavity.   Cath: 07/22/22     Prox LAD to Mid LAD  lesion is 100% stenosed.   Dist Cx lesion is 80% stenosed.   A drug-eluting stent was successfully placed using a SYNERGY XD 3.0X20.   Post intervention, there is a 0% residual stenosis.   Successful PCI of the mid LCx with DES x 1     Plan: DAPT with ASA for one month, plavix for 12 months. OK to resume Coumadin tomorrow if no bleeding complications.  Diagnostic Dominance: Left  Intervention       Patient Profile     71 y.o. male with a PMHx of nonobstructive CAD 2011, AAA s/p repair 2013, HTN, HLD, T2DM, RCC s/p partial left nephrectomy 2015, OSA, GERD, RLS. who is being seen 07/18/2022 for the evaluation of acute on chronic HFrEF, NSTEMI and known CAD at the request of the primary hospitalist service.  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 on HD -- Initial presentation he was significantly volume overload in the setting of HFrEF and ESRD.  -- Nephrology on board   HFrEF  ICM  -- Echo 06/12/22 LVEF 30-35% with clear evidence of LV thrombus. Akinesis of the anterior wall. No significant valve disease. Cardiac MRI 10/13 with LVthrombus -- volume management per HD  -- on coreg 6.'25mg'$  BID, hydralazine '25mg'$  BID, isordil '20mg'$  BID. Blood pressures tolerating but soft -- currently no ACE/ARB/ARNi, and no SGLT2 inhibitor with renal disease.   CAD w/ Angina Elevated Troponin -- cath through the [facility] with severe 2v disease with CTO of the LAD and 80% Lcx. Outpatient cardiac MRI 10/13 without viable myocardium. Per Dr. Kipp Brood, not a candidate for CABG. Underwent PCI/DES of mLcx on 10/20. Recommendations for triple therapy with ASA, plavix and coumadin for one month, then drop ASA. Given his chronic anemia along with one episode of coffee ground emesis, question whether we can drop ASA once stable to resume coumadin vs Eliquis.  -- continue ASA, statin, BB, Isordil and hydralazine.   LV thrombus -- diagnosed at the [facility] -- Cardiac MRI +  thrombus -- plan to resume anticoagulation but INR remains elevated at 3.4>>4.7 -- Eliquis may be better option? -- LFTs WNL  -- give Vit K '2mg'$  IV x1 now   HTN  -- continue coreg 6.'25mg'$  BID, hydralazine '25mg'$  TID, Isordil 20 mg   HLD  -- recent LDL 60 -- Continue with atorvastatin 40 mg daily.   Bilateral LE pain -- Significant bilateral leg pain which is worse with exertion but also at rest when not wearing nasal cannula oxygen. Recent LE dopplers (07/11/2022) normal.   Anemia GI bleed (coffee ground emesis/melena) -- Hgb chronically low, overall remains relatively stable and actually improving  -- Tsat 10, received IV iron  -- had single episode of coffee ground emesis but also reported episodes of melena -- s/p 3 units PRBCs -- remains on IV protonix '40mg'$  BID -- on ASA and plavix   Abd pain Constipation  -- had a small BM the evening 10/23 -- CT abd  with small bowel obstruction with transition to decreased caliber small bowel in the RLQ -- gen surgery consulted but not clear that he is candidate for any procedure including NG tube given his GIB  Given his worsening condition with HF, renal failure, GIB and now small bowel obstruction with limited options, spoke with family regarding a palliative care consult for [place] moving forward. He has been made a DNR this morning.    For questions or updates, please contact [place] Please consult [email or web address] for contact info under        Signed, Reino Bellis, NP  07/27/2022, 9:11 AM     Patient seen and examined. Agree with assessment and plan. INR remains elevated and trending upward. Per pharmacy to give a very small dose of Vit K.  Now with SBO as noted . Palliative care has been consulted.  Plan to have gastric emptying scan  per surgery.   Troy Sine, MD, [facility] 07/27/2022 11:05 AM

## 2022-07-27 NOTE — Progress Notes (Signed)
OT Cancellation Note  Patient Details Name: Allen Cortez MRN: [id number] DOB: 1951/03/01   Cancelled Treatment:    Reason Eval/Treat Not Completed: Patient declined, no reason specified (Pt on BSC upon arrival. Declining to participate in therapy today. Family present adn assisting with toileting. OT to continue efforts)  [name] 07/27/2022, 3:16 PM

## 2022-07-27 NOTE — Progress Notes (Addendum)
[facility] Progress Note   Subjective:   Patient seen and examined at bedside.  Feels terrible.  Reports nausea, vomiting and increased abdominal pain and pressure. CT completed overnight shows SBO.  Denies CP.  SOB is about the same.  Reports ongoing pain in LE during dialysis yesterday, no worse than usual.  Objective Vitals:   07/26/22 1342 07/26/22 1810 07/26/22 2118 07/27/22 0554  BP: 123/82 130/73 101/72 101/65  Pulse: 95 (!) 102 92 95  Resp:   14 16  Temp:   97.8 F (36.6 C) 97.8 F (36.6 C)  TempSrc:   Oral Oral  SpO2: 90% 97% 99% 96%  Weight:      Height:       Physical Exam General:chronically ill appearing male in NAD Heart:RRR, no mrg Lungs:CTAB, nml WOB on RA Abdomen:+distended, +tenderness Extremities:no LE edema Dialysis Access: [facility]   Filed Weights   07/23/22 1217 07/26/22 0806 07/26/22 1214  Weight: 64.2 kg 63.7 kg 62.2 kg    Intake/Output Summary (Last 24 hours) at 07/27/2022 1318 Last data filed at 07/26/2022 2245 Gross per 24 hour  Intake 240 ml  Output 1 ml  Net 239 ml    Additional Objective Labs: Basic Metabolic Panel: Recent Labs  Lab 07/23/22 0223 07/26/22 0244 07/27/22 0456  NA 137 132* 136  K 4.7 5.5* 4.9  CL 91* 91* 93*  CO2 '27 23 23  '$ GLUCOSE 125* 107* 112*  BUN 40* 78* 52*  CREATININE 5.53* 7.20* 5.11*  CALCIUM 8.7* 8.1* 7.8*  PHOS  --  6.0*  --    Liver Function Tests: Recent Labs  Lab 07/26/22 0244 07/27/22 0456  AST  --  24  ALT  --  14  ALKPHOS  --  66  BILITOT  --  1.3*  PROT  --  5.6*  ALBUMIN 2.1* 1.9*    CBC: Recent Labs  Lab 07/23/22 0223 07/24/22 0145 07/24/22 1701 07/25/22 0213 07/26/22 0244 07/27/22 0456  WBC 11.3* 14.8*  --  8.3 5.7 9.9  HGB 8.8* 7.2*   < > 7.9* 8.2* 8.7*  HCT 26.5* 21.9*   < > 23.9* 25.0* 26.2*  MCV 96.4 97.3  --  93.4 92.9 92.3  PLT 173 154  --  144* 196 213   < > = values in this interval not displayed.   CBG: Recent Labs  Lab 07/25/22 2007  07/26/22 1623 07/26/22 2116 07/27/22 0800 07/27/22 1228  GLUCAP 115* 123* 125* 124* 138*   Studies/Results: CT ABDOMEN PELVIS WO CONTRAST  Result Date: 07/27/2022 CLINICAL DATA:  Abdominal pain. History of renal cell carcinoma status post partial left nephrectomy. EXAM: CT ABDOMEN AND PELVIS WITHOUT CONTRAST TECHNIQUE: Multidetector CT imaging of the abdomen and pelvis was performed following the standard protocol without IV contrast. RADIATION DOSE REDUCTION: This exam was performed according to the departmental dose-optimization program which includes automated exposure control, adjustment of the mA and/or kV according to patient size and/or use of iterative reconstruction technique. COMPARISON:  CT AP 03/22/2022 and 04/07/2017. FINDINGS: Lower chest: There is a small right pleural effusion. Peripheral airspace consolidation within the right lower lobe is identified. Ground-glass attenuation and interstitial thickening concerning for mild edema. Cardiac enlargement and aortic atherosclerosis. Hepatobiliary: Cyst within right lobe of liver is unchanged measuring 0.9 cm. Gallbladder negative. No signs of bile duct dilatation. Pancreas: Unremarkable. No pancreatic ductal dilatation or surrounding inflammatory changes. Spleen: Normal in size without focal abnormality. Adrenals/Urinary Tract: Normal adrenal glands. Bilateral, benign Bosniak class 1  and 2 kidney cysts measure up to 2.4 cm. No follow-up imaging recommended. Postoperative change identified along the lateral aspect of the left kidney compatible with prior partial nephrectomy. Calcification within the left retroperitoneum adjacent to the lower pole of left kidney compatible with chronic fat necrosis. Unchanged. No nephrolithiasis or signs of hydronephrosis. There is high attenuation material within the urinary bladder which may reflect recently administered gadolinium contrast agent. No focal bladder abnormality noted. Stomach/Bowel: Stomach  appears normal. Multiple dilated loops of small bowel are identified along with small bowel air-fluid levels. The small bowel loops measure up to 4.2 cm. Transition to decreased caliber distal small bowel noted in the right lower quadrant of the abdomen, image 68/4. Terminal ileum has a normal caliber. The appendix is visualized and appears normal. Normal appearance of the colon. Vascular/Lymphatic: Extensive aortic atherosclerosis. Infrarenal abdominal aortic aneurysm is identified measuring 3.4 cm and maximum AP diameter, image 34/4. No signs of abdominopelvic adenopathy. Reproductive: Prostate is unremarkable. Other: No free fluid or fluid collections. No signs of pneumoperitoneum. Musculoskeletal: No acute or significant osseous findings. IMPRESSION: 1. Examination is positive for small bowel obstruction with transition to decreased caliber distal small bowel in the right lower quadrant of the abdomen. 2. Small right pleural effusion with peripheral airspace consolidation within the right lower lobe. Correlate for signs of right lower lobe inflammation/infection 3. Infrarenal abdominal aortic aneurysm measuring 3.4 cm and maximum AP diameter. Recommend follow-up every 3 years. Aortic aneurysm NOS (ICD10-I71.9). 4.  Aortic Atherosclerosis (ICD10-I70.0). These results will be called to the ordering clinician or representative by the Radiologist Assistant, and communication documented in the PACS or [employer]. Electronically Signed   By: Kerby Moors M.D.   On: 07/27/2022 05:46   DG Chest 1 View  Result Date: 07/26/2022 CLINICAL DATA:  Provided history: Dyspnea. EXAM: CHEST  1 VIEW COMPARISON:  Prior chest radiographs 07/25/2022 and earlier. FINDINGS: A portion of the left lateral costophrenic angle is excluded from the field of view. Unchanged position of a right-sided central venous catheter with tip terminating at the level of the upper right atrium. Cardiomegaly. Bilateral interstitial and  ill-defined airspace opacities compatible with edema, similar to slightly decreased. Persistent small right pleural effusion. No evidence of pneumothorax. IMPRESSION: A portion of the left costophrenic angle is excluded from the field of view. Pulmonary edema, similar to slightly decreased from the prior examination of 07/25/2022. Persistent small right pleural effusion. Cardiomegaly, unchanged. Electronically Signed   By: Kellie Simmering D.O.   On: 07/26/2022 16:34    Medications:  sodium chloride     promethazine (PHENERGAN) injection (IM or IVPB) Stopped (07/23/22 1026)    aspirin EC  81 mg Oral Daily   atorvastatin  40 mg Oral QHS   bisacodyl  10 mg Oral Q1200   carvedilol  6.25 mg Oral BID WC   Chlorhexidine Gluconate Cloth  6 each Topical Q0600   citalopram  20 mg Oral QHS   clonazePAM  0.5 mg Oral QHS   clopidogrel  75 mg Oral Q breakfast   darbepoetin (ARANESP) injection - DIALYSIS  150 mcg Intravenous Q Sat-HD   docusate sodium  100 mg Oral BID   feeding supplement (NEPRO CARB STEADY)  237 mL Oral TID BM   guaiFENesin  600 mg Oral BID   hydrALAZINE  25 mg Oral BID   insulin aspart  0-6 Units Subcutaneous TID AC & HS   isosorbide dinitrate  20 mg Oral BID   multivitamin  1  tablet Oral QHS   pantoprazole (PROTONIX) IV  40 mg Intravenous Q12H   polyethylene glycol  17 g Oral BID   QUEtiapine  300 mg Oral QHS   sodium chloride flush  3 mL Intravenous Q12H    Dialysis Orders: [facility] 4hr TTS EDW presumably 70kg 3/2.25, NO UFP Heparin 2K Units Mircera 50 q2weeks (last given 10/14)   Background: 71 y.o. 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 He is presenting with worsening shortness of breath, orthopnea, poor exercise  tolerance;  he has decreased urine output  CXR showed Interval increased prominence of the interstitial markings and increased patchy densities in both lungs. Small bilateral pleural effusions.    Assessment/Plan: Acute hypoxic resp failure - likely related to volume overload. CXR w/CHF and b/l pleural effusions.  Improved, now under dry weight.  Repeat CXR 10/23 with persistent cardiomegaly and pulmonary edema. Probable layering pleural fluid.  UF limited by cramping, continue to titrate down as tolerated.  AKI on CKD 3b - baseline creatinine 1.6 prior to recent heart cath. - Next HD 10/24. Minimal to no UOP, no signs of renal recovery so far. HD dependent for 6 weeks- this likely means ESRD-  cont on TTS schedule.  Hx severe leg pain with dialysis.  At outpt HD he actually takes Oxycodone prescribed by cardiology for the leg pain per family.  SBO - identified on CT 10/24.  Surgery consulted and patient is not good candidate for surgery or NG tube placement.  On bowel rest.  HTN - BP in goal w/Coreg 6.'25mg'$  BID, hydralazine '25mg'$  BID, and isordil '20mg'$  BID.  d/c home lasix d/t minimal to no UOP. CAD - sp LHC on 8/22 at [facility] as above and seen by CTS. MRI last 10/13, may need CABG with total occlusion in the LAD.  He may not recover renal function and may need to proceed with CABG with the designation of ESRD.  CTS consulted - no plans for surgery at this time, high risk.  s/p DES to circ on 10/20. Needs triple AC therapy - ASA, plavix and coumadin x 1 month, then just plavix, coumadin. Per GI ok with ASA, plavix therapy continued as long as no overt bleeding, hold coumadin if ongoing drop in Hgb, ok to restart heparin if Hgb stable.  Per cardiology A/C systolic CHF - echo with EF 30-35%.  Meds limited by kidney function.  Volume removal with HD.  Anemia-  Hgb ^8.7 s/p 1 unit pRBC 10/22, 3 units total since admission. Tsat 19%, getting iron load.  ESA 150 qSat. Bones-  CCa and phos in goal.  Not on binders or  VDRA.  Nutrition - Currently NPO  GOC - patient made DNR today.   Jen Mow, PA-C [facility] 07/27/2022,1:18 PM  LOS: 9 days    Seen and examined independently.  Agree with note and exam as documented above by physician extender and as noted here.  Small bowel obstruction noted overnight.  Patient's wife and sister are at bedside.  Primary team discussed poor prognosis with his wife this am.  His wife shares he has a history of GI bleed and they are watching for same.  She shares he can't get the  NGT due to his hx of GI bleed.   General chronically ill appearing elderly male in bed in no acute distress HEENT normocephalic atraumatic extraocular movements intact sclera anicteric Neck supple trachea midline Lungs clear to auscultation bilaterally normal work of breathing at rest on 3 liters oxygen  Heart S1S2 no rub Abdomen soft nontender nondistended Extremities no edema  Psych normal mood and affect Neuro awake and interactive; hard of hearing and speaks very softly  Access RIJ tunn catheter   ESRD - HD per TTS schedule    Anemia of CKD - on ESA.   Small bowel obstruction - per primary team   CAD - as above.  Per cardiology. On medical therapy    HTN - controlled  Palliative care consulted.  He has had another setback with the small bowel obstruction.   Claudia Desanctis, MD 07/27/2022 3:12 PM

## 2022-07-28 ENCOUNTER — Inpatient Hospital Stay (HOSPITAL_COMMUNITY): Payer: No Typology Code available for payment source

## 2022-07-28 DIAGNOSIS — I25119 Atherosclerotic heart disease of native coronary artery with unspecified angina pectoris: Secondary | ICD-10-CM | POA: Diagnosis not present

## 2022-07-28 DIAGNOSIS — N170 Acute kidney failure with tubular necrosis: Secondary | ICD-10-CM | POA: Diagnosis not present

## 2022-07-28 DIAGNOSIS — K56609 Unspecified intestinal obstruction, unspecified as to partial versus complete obstruction: Secondary | ICD-10-CM | POA: Diagnosis not present

## 2022-07-28 DIAGNOSIS — I5023 Acute on chronic systolic (congestive) heart failure: Secondary | ICD-10-CM | POA: Diagnosis not present

## 2022-07-28 LAB — GLUCOSE, CAPILLARY
Glucose-Capillary: 131 mg/dL — ABNORMAL HIGH (ref 70–99)
Glucose-Capillary: 134 mg/dL — ABNORMAL HIGH (ref 70–99)
Glucose-Capillary: 94 mg/dL (ref 70–99)
Glucose-Capillary: 91 mg/dL (ref 70–99)

## 2022-07-28 LAB — CBC
HCT: 27.4 % — ABNORMAL LOW (ref 39.0–52.0)
Hemoglobin: 9.3 g/dL — ABNORMAL LOW (ref 13.0–17.0)
MCH: 30.5 pg (ref 26.0–34.0)
MCHC: 33.9 g/dL (ref 30.0–36.0)
MCV: 89.8 fL (ref 80.0–100.0)
Platelets: 249 10*3/uL (ref 150–400)
RBC: 3.05 MIL/uL — ABNORMAL LOW (ref 4.22–5.81)
RDW: 19.9 % — ABNORMAL HIGH (ref 11.5–15.5)
WBC: 14.1 10*3/uL — ABNORMAL HIGH (ref 4.0–10.5)
nRBC: 0.5 % — ABNORMAL HIGH (ref 0.0–0.2)

## 2022-07-28 LAB — BASIC METABOLIC PANEL
Anion gap: 24 — ABNORMAL HIGH (ref 5–15)
BUN: 92 mg/dL — ABNORMAL HIGH (ref 8–23)
CO2: 22 mmol/L (ref 22–32)
Calcium: 6.8 mg/dL — ABNORMAL LOW (ref 8.9–10.3)
Chloride: 91 mmol/L — ABNORMAL LOW (ref 98–111)
Creatinine, Ser: 6.73 mg/dL — ABNORMAL HIGH (ref 0.61–1.24)
GFR, Estimated: 8 mL/min — ABNORMAL LOW (ref >60)
Glucose, Bld: 131 mg/dL — ABNORMAL HIGH (ref 70–99)
Potassium: 5.4 mmol/L — ABNORMAL HIGH (ref 3.5–5.1)
Sodium: 137 mmol/L (ref 135–145)

## 2022-07-28 LAB — PROTIME-INR
INR: 1.4 — ABNORMAL HIGH (ref 0.8–1.2)
Prothrombin Time: 17.1 seconds — ABNORMAL HIGH (ref 11.4–15.2)

## 2022-07-28 MED ORDER — LORAZEPAM 2 MG/ML IJ SOLN: 0.5000 mg | Freq: Once | INTRAMUSCULAR | Status: DC

## 2022-07-28 MED ORDER — WARFARIN SODIUM 1 MG PO TABS
1.0000 mg | ORAL_TABLET | Freq: Once | ORAL | Status: AC
  Administered 2022-07-28: 1 mg via ORAL
  Filled 2022-07-28 (×2): qty 1

## 2022-07-28 MED ORDER — DARBEPOETIN ALFA 150 MCG/0.3ML IJ SOSY
150.0000 ug | PREFILLED_SYRINGE | INTRAMUSCULAR | Status: DC
  Administered 2022-07-30: 150 ug via SUBCUTANEOUS
  Filled 2022-07-28: qty 0.3

## 2022-07-28 MED ORDER — HALOPERIDOL LACTATE 5 MG/ML IJ SOLN
1.0000 mg | Freq: Four times a day (QID) | INTRAMUSCULAR | Status: DC | PRN
  Filled 2022-07-28: qty 1

## 2022-07-28 MED ORDER — LORAZEPAM 2 MG/ML IJ SOLN
0.5000 mg | Freq: Once | INTRAMUSCULAR | Status: AC
  Administered 2022-07-28: 0.5 mg via INTRAMUSCULAR
  Filled 2022-07-28: qty 1

## 2022-07-28 MED ORDER — WARFARIN - PHARMACIST DOSING INPATIENT: Freq: Every day | Status: DC

## 2022-07-28 MED ORDER — CLOPIDOGREL BISULFATE 75 MG PO TABS
75.0000 mg | ORAL_TABLET | Freq: Every day | ORAL | Status: DC
  Administered 2022-07-28 – 2022-08-02 (×6): 75 mg via ORAL
  Filled 2022-07-28 (×8): qty 1

## 2022-07-28 MED ORDER — HEPARIN SODIUM (PORCINE) 1000 UNIT/ML IJ SOLN
INTRAMUSCULAR | Status: AC
  Filled 2022-07-28: qty 4

## 2022-07-28 NOTE — Progress Notes (Addendum)
[facility] Progress Note   Subjective:   Patient seen and examined at bedside.  Continues to have abdominal pain but his stomach feels a little less bloated.  Denies CP and SOB.  Admits to diarrhea overnight. Per NT he is being placed on suicide precautions.   Objective Vitals:   07/26/22 2118 07/27/22 0554 07/27/22 2123 07/28/22 0457  BP: 101/72 101/65 120/84 114/80  Pulse: 92 95 (!) 101 (!) 101  Resp: '14 16 17 16  '$ Temp: 97.8 F (36.6 C) 97.8 F (36.6 C) 97.7 F (36.5 C) 97.7 F (36.5 C)  TempSrc: Oral Oral Oral Oral  SpO2: 99% 96% 95% 95%  Weight:      Height:       Physical Exam General:chronically ill appearing male in NAD Heart:RRR, no mrg Lungs:+rhonchi, nml WOB on 3L O2 via [place] Abdomen:soft, +tenderness Extremities:no LE edema Dialysis Access: [facility]   Filed Weights   07/23/22 1217 07/26/22 0806 07/26/22 1214  Weight: 64.2 kg 63.7 kg 62.2 kg   No intake or output data in the 24 hours ending 07/28/22 1112  Additional Objective Labs: Basic Metabolic Panel: Recent Labs  Lab 07/26/22 0244 07/27/22 0456 07/28/22 0642  NA 132* 136 137  K 5.5* 4.9 5.4*  CL 91* 93* 91*  CO2 '23 23 22  '$ GLUCOSE 107* 112* 131*  BUN 78* 52* 92*  CREATININE 7.20* 5.11* 6.73*  CALCIUM 8.1* 7.8* 6.8*  PHOS 6.0*  --   --    Liver Function Tests: Recent Labs  Lab 07/26/22 0244 07/27/22 0456  AST  --  24  ALT  --  14  ALKPHOS  --  66  BILITOT  --  1.3*  PROT  --  5.6*  ALBUMIN 2.1* 1.9*    CBC: Recent Labs  Lab 07/24/22 0145 07/24/22 1701 07/25/22 0213 07/26/22 0244 07/27/22 0456 07/28/22 0305  WBC 14.8*  --  8.3 5.7 9.9 14.1*  HGB 7.2*   < > 7.9* 8.2* 8.7* 9.3*  HCT 21.9*   < > 23.9* 25.0* 26.2* 27.4*  MCV 97.3  --  93.4 92.9 92.3 89.8  PLT 154  --  144* 196 213 249   < > = values in this interval not displayed.   CBG: Recent Labs  Lab 07/27/22 0800 07/27/22 1228 07/27/22 1728 07/27/22 2129 07/28/22 0821  GLUCAP 124* 138* 140* 136* 131*    Studies/Results: DG Abd Portable 1V-Small Bowel Obstruction Protocol-initial, 8 hr delay  Result Date: 07/27/2022 CLINICAL DATA:  8 hour small-bowel follow-up film EXAM: PORTABLE ABDOMEN - 1 VIEW COMPARISON:  CT from earlier in the same day. FINDINGS: Scattered large and small bowel gas is noted. Contrast material administered previously now lies predominately within the small bowel which shows dilatation similar to that seen on the prior CT. No contrast is noted within the colon. IMPRESSION: Contrast administered lies within dilated small bowel loops. No colonic contrast is seen. 24 hour follow-up film is recommended. Electronically Signed   By: [name] M.D.   On: 07/27/2022 21:01   CT ABDOMEN PELVIS WO CONTRAST  Result Date: 07/27/2022 CLINICAL DATA:  Abdominal pain. History of renal cell carcinoma status post partial left nephrectomy. EXAM: CT ABDOMEN AND PELVIS WITHOUT CONTRAST TECHNIQUE: Multidetector CT imaging of the abdomen and pelvis was performed following the standard protocol without IV contrast. RADIATION DOSE REDUCTION: This exam was performed according to the departmental dose-optimization program which includes automated exposure control, adjustment of the mA and/or kV according to patient size  and/or use of iterative reconstruction technique. COMPARISON:  CT AP 03/22/2022 and 04/07/2017. FINDINGS: Lower chest: There is a small right pleural effusion. Peripheral airspace consolidation within the right lower lobe is identified. Ground-glass attenuation and interstitial thickening concerning for mild edema. Cardiac enlargement and aortic atherosclerosis. Hepatobiliary: Cyst within right lobe of liver is unchanged measuring 0.9 cm. Gallbladder negative. No signs of bile duct dilatation. Pancreas: Unremarkable. No pancreatic ductal dilatation or surrounding inflammatory changes. Spleen: Normal in size without focal abnormality. Adrenals/Urinary Tract: Normal adrenal glands. Bilateral,  benign Bosniak class 1 and 2 kidney cysts measure up to 2.4 cm. No follow-up imaging recommended. Postoperative change identified along the lateral aspect of the left kidney compatible with prior partial nephrectomy. Calcification within the left retroperitoneum adjacent to the lower pole of left kidney compatible with chronic fat necrosis. Unchanged. No nephrolithiasis or signs of hydronephrosis. There is high attenuation material within the urinary bladder which may reflect recently administered gadolinium contrast agent. No focal bladder abnormality noted. Stomach/Bowel: Stomach appears normal. Multiple dilated loops of small bowel are identified along with small bowel air-fluid levels. The small bowel loops measure up to 4.2 cm. Transition to decreased caliber distal small bowel noted in the right lower quadrant of the abdomen, image 68/4. Terminal ileum has a normal caliber. The appendix is visualized and appears normal. Normal appearance of the colon. Vascular/Lymphatic: Extensive aortic atherosclerosis. Infrarenal abdominal aortic aneurysm is identified measuring 3.4 cm and maximum AP diameter, image 34/4. No signs of abdominopelvic adenopathy. Reproductive: Prostate is unremarkable. Other: No free fluid or fluid collections. No signs of pneumoperitoneum. Musculoskeletal: No acute or significant osseous findings. IMPRESSION: 1. Examination is positive for small bowel obstruction with transition to decreased caliber distal small bowel in the right lower quadrant of the abdomen. 2. Small right pleural effusion with peripheral airspace consolidation within the right lower lobe. Correlate for signs of right lower lobe inflammation/infection 3. Infrarenal abdominal aortic aneurysm measuring 3.4 cm and maximum AP diameter. Recommend follow-up every 3 years. Aortic aneurysm NOS (ICD10-I71.9). 4.  Aortic Atherosclerosis (ICD10-I70.0). These results will be called to the ordering clinician or representative by the  Radiologist Assistant, and communication documented in the PACS or [employer]. Electronically Signed   By: Kerby Moors M.D.   On: 07/27/2022 05:46   DG Chest 1 View  Result Date: 07/26/2022 CLINICAL DATA:  Provided history: Dyspnea. EXAM: CHEST  1 VIEW COMPARISON:  Prior chest radiographs 07/25/2022 and earlier. FINDINGS: A portion of the left lateral costophrenic angle is excluded from the field of view. Unchanged position of a right-sided central venous catheter with tip terminating at the level of the upper right atrium. Cardiomegaly. Bilateral interstitial and ill-defined airspace opacities compatible with edema, similar to slightly decreased. Persistent small right pleural effusion. No evidence of pneumothorax. IMPRESSION: A portion of the left costophrenic angle is excluded from the field of view. Pulmonary edema, similar to slightly decreased from the prior examination of 07/25/2022. Persistent small right pleural effusion. Cardiomegaly, unchanged. Electronically Signed   By: Kellie Simmering D.O.   On: 07/26/2022 16:34    Medications:  sodium chloride     promethazine (PHENERGAN) injection (IM or IVPB) Stopped (07/23/22 1026)    atorvastatin  40 mg Oral QHS   bisacodyl  10 mg Oral Q1200   carvedilol  6.25 mg Oral BID WC   Chlorhexidine Gluconate Cloth  6 each Topical Q0600   Chlorhexidine Gluconate Cloth  6 each Topical Q0600   citalopram  20 mg Oral  QHS   clonazePAM  0.5 mg Oral QHS   clopidogrel  75 mg Oral Q breakfast   [START ON 07/30/2022] darbepoetin (ARANESP) injection - DIALYSIS  150 mcg Subcutaneous Q Sat-1800   docusate sodium  100 mg Oral BID   feeding supplement (NEPRO CARB STEADY)  237 mL Oral TID BM   guaiFENesin  600 mg Oral BID   hydrALAZINE  25 mg Oral BID   insulin aspart  0-6 Units Subcutaneous TID AC & HS   isosorbide dinitrate  20 mg Oral BID   multivitamin  1 tablet Oral QHS   pantoprazole (PROTONIX) IV  40 mg Intravenous Q12H   polyethylene glycol  17 g  Oral BID   QUEtiapine  300 mg Oral QHS   sodium chloride flush  3 mL Intravenous Q12H    Dialysis Orders: 71 y.o. 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 He is presenting with worsening shortness of breath, orthopnea, poor exercise tolerance;  he has decreased urine output  CXR showed Interval increased prominence of the interstitial markings and increased patchy densities in both lungs. Small bilateral pleural effusions.    Assessment/Plan: Acute hypoxic resp failure - likely related to volume overload. CXR w/CHF and b/l pleural effusions.  Improved, now under dry weight.  Repeat CXR 10/23 with persistent cardiomegaly and pulmonary edema. Probable layering pleural fluid.  UF limited by cramping, continue to titrate down as tolerated.  AKI on CKD 3b - baseline creatinine 1.6 prior to recent heart cath. - Next HD 10/24. Minimal to no UOP, no signs of renal recovery so far. HD dependent for 6 weeks- this likely means ESRD-  cont on TTS schedule.  Hx severe leg pain with dialysis.  At outpt HD he actually takes Oxycodone prescribed by cardiology for the leg pain per family.  SBO - identified on CT 10/24.  Surgery consulted and patient is not good candidate for surgery or NG tube placement.  On bowel rest.  HTN - BP in goal w/IV meds.  PO meds on hold: Coreg 6.'25mg'$  BID, hydralazine '25mg'$  BID, and isordil '20mg'$  BID.  d/c home lasix d/t minimal to no UOP. CAD - sp LHC on 8/22 at [facility] as above and seen by CTS. MRI last 10/13, may need CABG with total occlusion in the LAD.  He may not recover renal function and may need to proceed with CABG with the designation of ESRD.  CTS consulted - no plans for surgery at this time, high risk.  s/p DES to circ on  10/20. Needs triple AC therapy - ASA, plavix and coumadin x 1 month, then just plavix, coumadin. Per GI ok with ASA, plavix therapy continued as long as no overt bleeding, hold coumadin if ongoing drop in Hgb, ok to restart heparin if Hgb stable.  Per cardiology A/C systolic CHF - echo with EF 30-35%.  Meds limited by kidney function.  Volume removal with HD.  Anemia-  Hgb ^8.7 s/p 1 unit pRBC 10/22, 3 units total since admission. Tsat 19%, getting iron load.  ESA 150 qSat. Bones-  CCa and phos in goal.  Not on binders or VDRA.  Nutrition - Currently NPO  GOC - patient made DNR today.  SI -  Hx PTSD.  Psych consult ordered.      Jen Mow, PA-C [facility] 07/28/2022,11:12 AM  LOS: 10 days    Seen and examined independently.  Agree with note and exam as documented above by physician extender and as noted here.  Patient now is on suicide precautions.  When I ask questions about his breathing he at first shares with me an anecdote about [place].  Just worked with OT and short of breath with exertion.  He had two BM's today and states he also threw up.   General frail chronically ill appearing elderly male in bed in no acute distress HEENT normocephalic atraumatic extraocular movements intact sclera anicteric Neck supple trachea midline Lungs clear to auscultation bilaterally normal work of breathing at rest on 3 liters oxygen  Heart S1S2 no rub Abdomen soft nontender nondistended Extremities no edema  Psych normal mood and affect Neuro awake and interactive; hard of hearing and speaks very softly  Access RIJ tunn catheter   ESRD - HD per TTS schedule. 2K bath    Anemia of CKD - on ESA and with slight improvement    Small bowel obstruction - per primary team; pt reports two BM's today and emesis today   CAD - as above.  Per cardiology. On medical therapy    HTN - acceptable   Palliative care consulted.  he is struggling   Claudia Desanctis,  MD 07/28/2022 12:44 PM

## 2022-07-28 NOTE — Progress Notes (Signed)
Progress Note  6 Days Post-Op  Subjective: Had several bowel movements yesterday and this am but also reports a few episodes of vomiting. Still feeling bloated and having abdominal pain.  Safety sitter bedside  Objective: Vital signs in last 24 hours: Temp:  [97.7 F (36.5 C)] 97.7 F (36.5 C) (10/26 0457) Pulse Rate:  [101] 101 (10/26 0457) Resp:  [16-17] 16 (10/26 0457) BP: (114-120)/(80-84) 114/80 (10/26 0457) SpO2:  [95 %] 95 % (10/26 0457) Last BM Date : 07/26/22  Intake/Output from previous day: No intake/output data recorded. Intake/Output this shift: No intake/output data recorded.  PE: General: pleasant, WD, male who is sitting on edge of bed in NAD Lungs: respiratory effort nonlabored Abd: soft, moderate distension. Diffusely TTP without rebound or guarding - greatest over suprapubic region MSK: all 4 extremities are symmetrical with no cyanosis, clubbing, or edema. Skin: warm and dry Psych: A&Ox3 with an appropriate affect.    Lab Results:  Recent Labs    07/27/22 0456 07/28/22 0305  WBC 9.9 14.1*  HGB 8.7* 9.3*  HCT 26.2* 27.4*  PLT 213 249   BMET Recent Labs    07/27/22 0456 07/28/22 0642  NA 136 137  K 4.9 5.4*  CL 93* 91*  CO2 23 22  GLUCOSE 112* 131*  BUN 52* 92*  CREATININE 5.11* 6.73*  CALCIUM 7.8* 6.8*   PT/INR Recent Labs    07/27/22 0456 07/28/22 0305  LABPROT 43.8* 17.1*  INR 4.7* 1.4*   CMP     Component Value Date/Time   NA 137 07/28/2022 0642   K 5.4 (H) 07/28/2022 0642   CL 91 (L) 07/28/2022 0642   CO2 22 07/28/2022 0642   GLUCOSE 131 (H) 07/28/2022 0642   BUN 92 (H) 07/28/2022 0642   CREATININE 6.73 (H) 07/28/2022 0642   CALCIUM 6.8 (L) 07/28/2022 0642   PROT 5.6 (L) 07/27/2022 0456   ALBUMIN 1.9 (L) 07/27/2022 0456   AST 24 07/27/2022 0456   ALT 14 07/27/2022 0456   ALKPHOS 66 07/27/2022 0456   BILITOT 1.3 (H) 07/27/2022 0456   GFRNONAA 8 (L) 07/28/2022 0642   GFRAA 76 (L) 04/09/2014 1537   Lipase   No results found for: "LIPASE"     Studies/Results: DG Abd Portable 1V-Small Bowel Obstruction Protocol-initial, 8 hr delay  Result Date: 07/27/2022 CLINICAL DATA:  8 hour small-bowel follow-up film EXAM: PORTABLE ABDOMEN - 1 VIEW COMPARISON:  CT from earlier in the same day. FINDINGS: Scattered large and small bowel gas is noted. Contrast material administered previously now lies predominately within the small bowel which shows dilatation similar to that seen on the prior CT. No contrast is noted within the colon. IMPRESSION: Contrast administered lies within dilated small bowel loops. No colonic contrast is seen. 24 hour follow-up film is recommended. Electronically Signed   By: [name] M.D.   On: 07/27/2022 21:01   CT ABDOMEN PELVIS WO CONTRAST  Result Date: 07/27/2022 CLINICAL DATA:  Abdominal pain. History of renal cell carcinoma status post partial left nephrectomy. EXAM: CT ABDOMEN AND PELVIS WITHOUT CONTRAST TECHNIQUE: Multidetector CT imaging of the abdomen and pelvis was performed following the standard protocol without IV contrast. RADIATION DOSE REDUCTION: This exam was performed according to the departmental dose-optimization program which includes automated exposure control, adjustment of the mA and/or kV according to patient size and/or use of iterative reconstruction technique. COMPARISON:  CT AP 03/22/2022 and 04/07/2017. FINDINGS: Lower chest: There is a small right pleural effusion. Peripheral airspace consolidation  within the right lower lobe is identified. Ground-glass attenuation and interstitial thickening concerning for mild edema. Cardiac enlargement and aortic atherosclerosis. Hepatobiliary: Cyst within right lobe of liver is unchanged measuring 0.9 cm. Gallbladder negative. No signs of bile duct dilatation. Pancreas: Unremarkable. No pancreatic ductal dilatation or surrounding inflammatory changes. Spleen: Normal in size without focal abnormality. Adrenals/Urinary  Tract: Normal adrenal glands. Bilateral, benign Bosniak class 1 and 2 kidney cysts measure up to 2.4 cm. No follow-up imaging recommended. Postoperative change identified along the lateral aspect of the left kidney compatible with prior partial nephrectomy. Calcification within the left retroperitoneum adjacent to the lower pole of left kidney compatible with chronic fat necrosis. Unchanged. No nephrolithiasis or signs of hydronephrosis. There is high attenuation material within the urinary bladder which may reflect recently administered gadolinium contrast agent. No focal bladder abnormality noted. Stomach/Bowel: Stomach appears normal. Multiple dilated loops of small bowel are identified along with small bowel air-fluid levels. The small bowel loops measure up to 4.2 cm. Transition to decreased caliber distal small bowel noted in the right lower quadrant of the abdomen, image 68/4. Terminal ileum has a normal caliber. The appendix is visualized and appears normal. Normal appearance of the colon. Vascular/Lymphatic: Extensive aortic atherosclerosis. Infrarenal abdominal aortic aneurysm is identified measuring 3.4 cm and maximum AP diameter, image 34/4. No signs of abdominopelvic adenopathy. Reproductive: Prostate is unremarkable. Other: No free fluid or fluid collections. No signs of pneumoperitoneum. Musculoskeletal: No acute or significant osseous findings. IMPRESSION: 1. Examination is positive for small bowel obstruction with transition to decreased caliber distal small bowel in the right lower quadrant of the abdomen. 2. Small right pleural effusion with peripheral airspace consolidation within the right lower lobe. Correlate for signs of right lower lobe inflammation/infection 3. Infrarenal abdominal aortic aneurysm measuring 3.4 cm and maximum AP diameter. Recommend follow-up every 3 years. Aortic aneurysm NOS (ICD10-I71.9). 4.  Aortic Atherosclerosis (ICD10-I70.0). These results will be called to the  ordering clinician or representative by the Radiologist Assistant, and communication documented in the PACS or [employer]. Electronically Signed   By: Kerby Moors M.D.   On: 07/27/2022 05:46   DG Chest 1 View  Result Date: 07/26/2022 CLINICAL DATA:  Provided history: Dyspnea. EXAM: CHEST  1 VIEW COMPARISON:  Prior chest radiographs 07/25/2022 and earlier. FINDINGS: A portion of the left lateral costophrenic angle is excluded from the field of view. Unchanged position of a right-sided central venous catheter with tip terminating at the level of the upper right atrium. Cardiomegaly. Bilateral interstitial and ill-defined airspace opacities compatible with edema, similar to slightly decreased. Persistent small right pleural effusion. No evidence of pneumothorax. IMPRESSION: A portion of the left costophrenic angle is excluded from the field of view. Pulmonary edema, similar to slightly decreased from the prior examination of 07/25/2022. Persistent small right pleural effusion. Cardiomegaly, unchanged. Electronically Signed   By: Kellie Simmering D.O.   On: 07/26/2022 16:34    Anti-infectives: Anti-infectives (From admission, onward)    None        Assessment/Plan Upper GI bleed Coffee ground emesis 10/20. GI consulted and did not recommend EGD given acute problems and need for plavix and anticoagulation. Hemoglobin stable s/p 3 units prbc total. GI signed off when no further signs of acute bleeding SBO - CT scan 10/25 showing small bowel obstruction with transition to decreased caliber distal small bowel in the right lower quadrant of the abdomen.  - Likely adhesive obstruction in the setting of prior abdominal surgeries.  -  undergoing SBO protocol with contrast PO. 8  hour film with contrast in SB. Await 24 hour film - continue NPO for bowel rest. Do not recommend NGT given h/o UGI on anticoagulation  - Patient is a very high risk surgical candidate. 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  .    LOS: 10 days   [facility] 07/28/2022, 8:45 AM Please see Amion for pager number during day hours 7:00am-4:30pm

## 2022-07-28 NOTE — Progress Notes (Addendum)
Rounding Note    Patient Name: Allen Cortez Date of Encounter: 07/28/2022  [place] Cardiologist: Shelva Majestic, MD   Subjective   Actually sitting up in bed this morning. "Feels crazy", still with abd pain.   Inpatient Medications    Scheduled Meds:  aspirin EC  81 mg Oral Daily   atorvastatin  40 mg Oral QHS   bisacodyl  10 mg Oral Q1200   carvedilol  6.25 mg Oral BID WC   Chlorhexidine Gluconate Cloth  6 each Topical Q0600   Chlorhexidine Gluconate Cloth  6 each Topical Q0600   citalopram  20 mg Oral QHS   clonazePAM  0.5 mg Oral QHS   clopidogrel  75 mg Oral Q breakfast   darbepoetin (ARANESP) injection - DIALYSIS  150 mcg Intravenous Q Sat-HD   docusate sodium  100 mg Oral BID   feeding supplement (NEPRO CARB STEADY)  237 mL Oral TID BM   guaiFENesin  600 mg Oral BID   hydrALAZINE  25 mg Oral BID   insulin aspart  0-6 Units Subcutaneous TID AC & HS   isosorbide dinitrate  20 mg Oral BID   multivitamin  1 tablet Oral QHS   pantoprazole (PROTONIX) IV  40 mg Intravenous Q12H   polyethylene glycol  17 g Oral BID   QUEtiapine  300 mg Oral QHS   sodium chloride flush  3 mL Intravenous Q12H   Continuous Infusions:  sodium chloride     promethazine (PHENERGAN) injection (IM or IVPB) Stopped (07/23/22 1026)   PRN Meds: sodium chloride, acetaminophen **OR** acetaminophen, albuterol, HYDROmorphone (DILAUDID) injection, LORazepam, ondansetron (ZOFRAN) IV, mouth rinse, promethazine (PHENERGAN) injection (IM or IVPB), sodium chloride flush   Vital Signs    Vitals:   07/26/22 2118 07/27/22 0554 07/27/22 2123 07/28/22 0457  BP: 101/72 101/65 120/84 114/80  Pulse: 92 95 (!) 101 (!) 101  Resp: '14 16 17 16  '$ Temp: 97.8 F (36.6 C) 97.8 F (36.6 C) 97.7 F (36.5 C) 97.7 F (36.5 C)  TempSrc: Oral Oral Oral Oral  SpO2: 99% 96% 95% 95%  Weight:      Height:       No intake or output data in the 24 hours ending 07/28/22 0941    07/26/2022   12:14 PM  07/26/2022    8:06 AM 07/23/2022   12:17 PM  Last 3 Weights  Weight (lbs) 137 lb 2 oz 140 lb 6.9 oz 141 lb 8.6 oz  Weight (kg) 62.2 kg 63.7 kg 64.2 kg      Telemetry    Sinus Tachycardia - Personally Reviewed  ECG    No new tracing  Physical Exam   GEN: Frail ill appearing older male, sitting up in bed  Neck: No JVD Cardiac: RRR, no murmurs, rubs, or gallops.  Respiratory: Clear to auscultation bilaterally. GI: Softer, but still distended, faint BS MS: No edema; No deformity. Neuro:  Nonfocal  Psych: Normal affect   Labs    High Sensitivity Troponin:   Recent Labs  Lab 07/18/22 1653 07/18/22 1845 07/18/22 2045 07/19/22 0454  TROPONINIHS 266* 271* 256* 288*     Chemistry Recent Labs  Lab 07/26/22 0244 07/27/22 0456 07/28/22 0642  NA 132* 136 137  K 5.5* 4.9 5.4*  CL 91* 93* 91*  CO2 '23 23 22  '$ GLUCOSE 107* 112* 131*  BUN 78* 52* 92*  CREATININE 7.20* 5.11* 6.73*  CALCIUM 8.1* 7.8* 6.8*  PROT  --  5.6*  --  ALBUMIN 2.1* 1.9*  --   AST  --  24  --   ALT  --  14  --   ALKPHOS  --  66  --   BILITOT  --  1.3*  --   GFRNONAA 7* 11* 8*  ANIONGAP 18* 20* 24*    Lipids No results for input(s): "CHOL", "TRIG", "HDL", "LABVLDL", "LDLCALC", "CHOLHDL" in the last 168 hours.  Hematology Recent Labs  Lab 07/26/22 0244 07/27/22 0456 07/28/22 0305  WBC 5.7 9.9 14.1*  RBC 2.69* 2.84* 3.05*  HGB 8.2* 8.7* 9.3*  HCT 25.0* 26.2* 27.4*  MCV 92.9 92.3 89.8  MCH 30.5 30.6 30.5  MCHC 32.8 33.2 33.9  RDW 19.9* 19.9* 19.9*  PLT 196 213 249   Thyroid No results for input(s): "TSH", "FREET4" in the last 168 hours.  BNPNo results for input(s): "BNP", "PROBNP" in the last 168 hours.  DDimer No results for input(s): "DDIMER" in the last 168 hours.   Radiology    DG Abd Portable 1V-Small Bowel Obstruction Protocol-initial, 8 hr delay  Result Date: 07/27/2022 CLINICAL DATA:  8 hour small-bowel follow-up film EXAM: PORTABLE ABDOMEN - 1 VIEW COMPARISON:  CT from  earlier in the same day. FINDINGS: Scattered large and small bowel gas is noted. Contrast material administered previously now lies predominately within the small bowel which shows dilatation similar to that seen on the prior CT. No contrast is noted within the colon. IMPRESSION: Contrast administered lies within dilated small bowel loops. No colonic contrast is seen. 24 hour follow-up film is recommended. Electronically Signed   By: [name] M.D.   On: 07/27/2022 21:01   CT ABDOMEN PELVIS WO CONTRAST  Result Date: 07/27/2022 CLINICAL DATA:  Abdominal pain. History of renal cell carcinoma status post partial left nephrectomy. EXAM: CT ABDOMEN AND PELVIS WITHOUT CONTRAST TECHNIQUE: Multidetector CT imaging of the abdomen and pelvis was performed following the standard protocol without IV contrast. RADIATION DOSE REDUCTION: This exam was performed according to the departmental dose-optimization program which includes automated exposure control, adjustment of the mA and/or kV according to patient size and/or use of iterative reconstruction technique. COMPARISON:  CT AP 03/22/2022 and 04/07/2017. FINDINGS: Lower chest: There is a small right pleural effusion. Peripheral airspace consolidation within the right lower lobe is identified. Ground-glass attenuation and interstitial thickening concerning for mild edema. Cardiac enlargement and aortic atherosclerosis. Hepatobiliary: Cyst within right lobe of liver is unchanged measuring 0.9 cm. Gallbladder negative. No signs of bile duct dilatation. Pancreas: Unremarkable. No pancreatic ductal dilatation or surrounding inflammatory changes. Spleen: Normal in size without focal abnormality. Adrenals/Urinary Tract: Normal adrenal glands. Bilateral, benign Bosniak class 1 and 2 kidney cysts measure up to 2.4 cm. No follow-up imaging recommended. Postoperative change identified along the lateral aspect of the left kidney compatible with prior partial nephrectomy.  Calcification within the left retroperitoneum adjacent to the lower pole of left kidney compatible with chronic fat necrosis. Unchanged. No nephrolithiasis or signs of hydronephrosis. There is high attenuation material within the urinary bladder which may reflect recently administered gadolinium contrast agent. No focal bladder abnormality noted. Stomach/Bowel: Stomach appears normal. Multiple dilated loops of small bowel are identified along with small bowel air-fluid levels. The small bowel loops measure up to 4.2 cm. Transition to decreased caliber distal small bowel noted in the right lower quadrant of the abdomen, image 68/4. Terminal ileum has a normal caliber. The appendix is visualized and appears normal. Normal appearance of the colon. Vascular/Lymphatic: Extensive aortic atherosclerosis. Infrarenal  abdominal aortic aneurysm is identified measuring 3.4 cm and maximum AP diameter, image 34/4. No signs of abdominopelvic adenopathy. Reproductive: Prostate is unremarkable. Other: No free fluid or fluid collections. No signs of pneumoperitoneum. Musculoskeletal: No acute or significant osseous findings. IMPRESSION: 1. Examination is positive for small bowel obstruction with transition to decreased caliber distal small bowel in the right lower quadrant of the abdomen. 2. Small right pleural effusion with peripheral airspace consolidation within the right lower lobe. Correlate for signs of right lower lobe inflammation/infection 3. Infrarenal abdominal aortic aneurysm measuring 3.4 cm and maximum AP diameter. Recommend follow-up every 3 years. Aortic aneurysm NOS (ICD10-I71.9). 4.  Aortic Atherosclerosis (ICD10-I70.0). These results will be called to the ordering clinician or representative by the Radiologist Assistant, and communication documented in the PACS or [employer]. Electronically Signed   By: Kerby Moors M.D.   On: 07/27/2022 05:46   DG Chest 1 View  Result Date: 07/26/2022 CLINICAL DATA:   Provided history: Dyspnea. EXAM: CHEST  1 VIEW COMPARISON:  Prior chest radiographs 07/25/2022 and earlier. FINDINGS: A portion of the left lateral costophrenic angle is excluded from the field of view. Unchanged position of a right-sided central venous catheter with tip terminating at the level of the upper right atrium. Cardiomegaly. Bilateral interstitial and ill-defined airspace opacities compatible with edema, similar to slightly decreased. Persistent small right pleural effusion. No evidence of pneumothorax. IMPRESSION: A portion of the left costophrenic angle is excluded from the field of view. Pulmonary edema, similar to slightly decreased from the prior examination of 07/25/2022. Persistent small right pleural effusion. Cardiomegaly, unchanged. Electronically Signed   By: Kellie Simmering D.O.   On: 07/26/2022 16:34    Cardiac Studies   Cardiac MRI on 07/15/22 IMPRESSION: 1. Subendocardial LGE consistent with prior infarct in mid anterior/anteroseptal, apical anterior/septal/inferior/lateral walls, and apex. LGE is greater than 50% transmural suggesting nonviability in apical anterior/septal/inferior/lateral walls and apex. LGE <50% transmural in mid anterior wall but wall thickness <4.55m suggests nonviability. Findings are consistent with nonviability in mid to distal LAD territory 2. Severe LV dilatation with severe systolic dysfunction (EF 233%. Thinning/akinesis of mid to apical anterior/anteroseptal walls and apical inferior/lateral walls. Aneurysm of apex 3.  LV apical thrombus measures 126mx 29m66m.  Normal RV size and systolic function (EF 48%82%. Diffuse pulmonary opacities and bilateral pleural effusions (moderate on right, small on left), consider CT chest for further Evaluation.   Echo in 06/2022  1. 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  Left ventricular ejection  fraction, by estimation, is 30-35%. The left  ventricle is abnormal. The left  ventricle has focal regional wall motion abnormalities. The left  ventricular internal cavity size was mildly dilated. There is akinesis of  the left ventricular, apical septal wall, inferior wall, anterior wall and  lateral wall. There is akinesis of the  left ventricular, entire apical segment. There is severe hypokinesis of  the left ventricular, basal-mid anterior wall. A falst tendon is present  in the mid LV cavity.   Cath: 07/22/22     Prox LAD to Mid LAD lesion is 100% stenosed.   Dist Cx lesion is 80% stenosed.   A drug-eluting stent was successfully placed using a SYNERGY XD [id number].   Post intervention, there is a 0% residual stenosis.   Successful PCI of the mid LCx with DES x 1  Plan: DAPT with ASA for one month, plavix for 12 months. OK to resume Coumadin tomorrow if no bleeding complications.     Diagnostic Dominance: Left  Intervention     Patient Profile     71 y.o. male with a PMHx of nonobstructive CAD 2011, AAA s/p repair 2013, HTN, HLD, T2DM, RCC s/p partial left nephrectomy 2015, OSA, GERD, RLS. who is being seen 07/18/2022 for the evaluation of acute on chronic HFrEF, NSTEMI and known CAD at the request of the primary hospitalist service.  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 on HD -- Initial presentation he was significantly volume overload in the setting of HFrEF and ESRD.  -- Nephrology on board   HFrEF  ICM  -- Echo 06/12/22 LVEF 30-35% with clear evidence of LV thrombus. Akinesis of the anterior wall. No significant valve disease. Cardiac MRI 10/13 with LVthrombus -- volume management per HD  -- on coreg 6.'25mg'$  BID, hydralazine '25mg'$  BID, isordil '20mg'$  BID. Blood pressures tolerating but soft (currently held with SBO protocol) -- currently no ACE/ARB/ARNi, and no SGLT2 inhibitor with renal disease.   CAD w/ Angina Elevated  Troponin -- cath through the [facility] with severe 2v disease with CTO of the LAD and 80% Lcx. Outpatient cardiac MRI 10/13 without viable myocardium. Per Dr. Kipp Brood, not a candidate for CABG. Underwent PCI/DES of mLcx on 10/20. Recommendations for triple therapy with ASA, plavix and coumadin for one month, then drop ASA. Given his chronic anemia along with one episode of coffee ground emesis, would plan for plavix and coumadin -- continue ASA, statin, BB, Isordil and hydralazine.   LV thrombus -- diagnosed at the [facility] -- Cardiac MRI + thrombus -- INR up to 4.7 (despite coumadin being held) s/p Vit K+ '2mg'$  IV, INR now 1.4 -- LFTs WNL  -- will plan to resume coumadin this evening    HTN  -- continue coreg 6.'25mg'$  BID, hydralazine '25mg'$  TID, Isordil 20 mg   HLD  -- recent LDL 60 -- Continue with atorvastatin 40 mg daily.   Bilateral LE pain -- Significant bilateral leg pain which is worse with exertion but also at rest when not wearing nasal cannula oxygen. Recent LE dopplers (07/11/2022) normal.   Anemia GI bleed (coffee ground emesis/melena) -- Hgb chronically low, overall remains relatively stable and actually improving  -- Tsat 10, received IV iron  -- had single episode of coffee ground emesis but also reported episodes of melena -- s/p 3 units PRBCs -- remains on IV protonix '40mg'$  BID   Abd pain Constipation  -- had a small BM the evening 10/23 -- CT abd  with small bowel obstruction with transition to decreased caliber small bowel in the RLQ -- gen surgery consulted but not clear that he is candidate for any procedure including NG tube given his GIB. Now on SBO protocol, will order for plavix/coumadin to be resumed after scan today   Given his worsening condition with HF, renal failure, GIB and now small bowel obstruction with limited options, spoke with family regarding a palliative care consult for Irene moving forward. He has been made a DNR.    For questions or updates, please  contact [place] Please consult [email or web address] for contact info under        Signed, Reino Bellis, NP  07/28/2022, 9:41 AM     Patient seen and examined. Agree with assessment and plan.  Patient admits that he had loose  stool bowel movements last night, very thin and watery.  He is scheduled to undergo his second scan around noon today.  He appears more comfortable and has less abdominal discomfort.  Blood pressure 114/80.  Sinus tachycardia at 100 bpm.  We will need to resume Plavix and warfarin and probably low-dose carvedilol to prevent rebound tachycardia he is status post stenting to his left circumflex on July 22, 2022.  Risk for stent thrombosis without resumption.  I spoke with his wife who states the patient strongly needs his Seroquel.  He remains on IV Protonix 40 mg twice daily with no further emesis.  He is scheduled for dialysis today.   Troy Sine, MD, [facility] 07/28/2022 10:04 AM

## 2022-07-28 NOTE — Progress Notes (Signed)
Occupational Therapy Treatment Patient Details Name: Allen Cortez MRN: [id number] DOB: 1951/06/26 Today's Date: 07/28/2022   History of present illness Pt is 71 yo male admitted 07/18/22 with SOB and LE edema. Workup for significant volume overload secondary to CHF, ESRD. S/p urgent HD with ultrafiltration 10/16. CT  concerning for SBO 10/24-25. PMH includes ESRD (HD TTS), CHF, CAD, AAA repair, HTN, HLD, DM2, OSA, GERD, renal cell carcinoma with partial L nephrectomy, CKD3, abdominal wall mass, depression.   OT comments  This 71 yo male seen today with hopes of working on ADLs at sink sit<>stand. Pt reported too much pain in low back, bil thighs, bil calves to go from bed to sink with rollator. He was able to sit EOB and wash his face, he was able to sit EOB and cross legs into "figure 4" to doff and donn socks as well as to stretch his lower back. He stands with min A and takes steps forward, back ,laterally with min A at EOB. He will continue to benefit from acute OT with follow [place]nard.   Recommendations for follow up therapy are one component of a multi-disciplinary discharge planning process, led by the attending physician.  Recommendations may be updated based on patient status, additional functional criteria and insurance authorization.    Follow Up Recommendations  Home health OT    Assistance Recommended at Discharge Frequent or constant Supervision/Assistance  Patient can return home with the following  A little help with walking and/or transfers;A lot of help with bathing/dressing/bathroom;Assistance with cooking/housework;Help with stairs or ramp for entrance;Assist for transportation;Direct supervision/assist for financial management;Direct supervision/assist for medications management   Equipment Recommendations  BSC/3in1       Precautions / Restrictions Precautions Precautions: Fall;Other (comment) Precaution Comments: fatigue with LE's; watch SpO2 Restrictions Weight  Bearing Restrictions: No       Mobility Bed Mobility Overal bed mobility: Needs Assistance Bed Mobility: Supine to Sit, Sit to Supine     Supine to sit: Min guard, HOB elevated Sit to supine: Min guard        Transfers Overall transfer level: Needs assistance Equipment used: Rollator (4 wheels) Transfers: Sit to/from Stand, Bed to chair/wheelchair/BSC Sit to Stand: Min assist     Step pivot transfers: Min assist           Balance Overall balance assessment: Needs assistance Sitting-balance support: Feet supported, No upper extremity supported Sitting balance-Leahy Scale: Fair     Standing balance support: Bilateral upper extremity supported, During functional activity Standing balance-Leahy Scale: Poor Standing balance comment: Reliant on UE support--no knee buckling with OT                           ADL either performed or assessed with clinical judgement   ADL Overall ADL's : Needs assistance/impaired     Grooming: Wash/dry face;Set up;Supervision/safety;Sitting Grooming Details (indicate cue type and reason): EOB, was thorough with washing face without cues             Lower Body Dressing: Minimal assistance;Sit to/from stand Lower Body Dressing Details (indicate cue type and reason): able to cross legs to get to feet for socks Toilet Transfer: Minimal assistance;Ambulation;Rollator (4 wheels) Toilet Transfer Details (indicate cue type and reason): steps forward, back, and laterally at [facility]                Extremity/Trunk Assessment Upper Extremity Assessment Upper Extremity Assessment: Generalized weakness  Vision Baseline Vision/History: 1 Wears glasses Vision Assessment?: No apparent visual deficits          Cognition Arousal/Alertness: Awake/alert Behavior During Therapy: Flat affect Overall Cognitive Status: Impaired/Different from baseline Area of Impairment: Attention, Following commands, Safety/judgement,  Awareness, Problem solving                   Current Attention Level: Selective   Following Commands: Follows one step commands with increased time, Follows multi-step commands inconsistently Safety/Judgement: Decreased awareness of safety Awareness: Intellectual Problem Solving: Slow processing, Requires verbal cues, Requires tactile cues, Difficulty sequencing                General Comments SpO2 down to as low as 88% on 3L O2 with ADL tasks at EOB, turned up to 4 liters with sats 94% and at end of session when he laid back down O2 turned back to 3 liters. Educated him on purse lipped breathing but he had trouble with coordinating this.    Pertinent Vitals/ Pain       Pain Assessment Pain Assessment: Faces Faces Pain Scale: Hurts even more Pain Location: Bil thighs, Bil calves, low back where SI joints are Pain Descriptors / Indicators: Grimacing, Aching, Sore Pain Intervention(s): Limited activity within patient's tolerance, Monitored during session, Repositioned         Frequency  Min 2X/week        Progress Toward Goals  OT Goals(current goals can now be found in the care plan section)  Progress towards OT goals: Progressing toward goals (has take increased time due to SOB for all activity)  Acute Rehab OT Goals Patient Stated Goal: did not state today OT Goal Formulation: With patient Time For Goal Achievement: 08/02/22 Potential to Achieve Goals: [place] Discharge plan remains appropriate       AM-PAC OT "6 Clicks" Daily Activity     Outcome Measure   Help from another person eating meals?: None Help from another person taking care of personal grooming?: A Little Help from another person toileting, which includes using toliet, bedpan, or urinal?: A Little Help from another person bathing (including washing, rinsing, drying)?: A Little Help from another person to put on and taking off regular upper body clothing?: A Little Help from another  person to put on and taking off regular lower body clothing?: A Little 6 Click Score: 19    End of Session Equipment Utilized During Treatment: Gait belt;Oxygen (3 liters at rest, 4 liters with activity to keep 88% or above)  OT Visit Diagnosis: Unsteadiness on feet (R26.81);Other abnormalities of gait and mobility (R26.89);Muscle weakness (generalized) (M62.81)   Activity Tolerance  (limited by SOB and pain)   Patient Left in bed;with call bell/phone within reach;with bed alarm set (sitter in room)           Time: 3419-6222 OT Time Calculation (min): 24 min  Charges: OT General Charges $OT Visit: 1 Visit OT Treatments $Self Care/Home Management : 23-37 mins  Golden Circle, OTR/L Acute Rehab Services [facility] [phone] Office [phone]    Almon Register 07/28/2022, 1:08 PM

## 2022-07-28 NOTE — Progress Notes (Addendum)
Upon AM assessment, patient asked orientation questions. When asked why he was in the hospital he stated that he was here because he tried to take his own life, he is currently endorsing suicidal ideation and that people would be better off with out him.  Spoke to Dr Broadus John, she stated to add a sitter to room, and await palliative consult before consulting psych.

## 2022-07-28 NOTE — Progress Notes (Signed)
ANTICOAGULATION CONSULT NOTE  Pharmacy Consult for Coumadin Indication: LV thrombus  Allergies  Allergen Reactions   Dilaudid [Hydromorphone Hcl] Other (See Comments)    hallucinations   Mirapex [Pramipexole Dihydrochloride] Other (See Comments)    Leg pain   Prednisone Other (See Comments)    irritability   Gabapentin     Other reaction(s): Tremor    Patient Measurements: Height: '5\' 10"'$  (177.8 cm) Weight: 62.2 kg (137 lb 2 oz) IBW/kg (Calculated) : 73  Vital Signs: Temp: 97.7 F (36.5 C) (10/26 0457) Temp Source: Oral (10/26 0457) BP: 114/80 (10/26 0457) Pulse Rate: 101 (10/26 0457)  Labs: Recent Labs    07/26/22 0244 07/27/22 0456 07/28/22 0305 07/28/22 0642  HGB 8.2* 8.7* 9.3*  --   HCT 25.0* 26.2* 27.4*  --   PLT 196 213 249  --   LABPROT 33.7* 43.8* 17.1*  --   INR 3.4* 4.7* 1.4*  --   CREATININE 7.20* 5.11*  --  6.73*     Estimated Creatinine Clearance: 8.7 mL/min (A) (by C-G formula based on SCr of 6.73 mg/dL (H)).   Medical History: Past Medical History:  Diagnosis Date   AAA (abdominal aortic aneurysm) (HCC)    Abdominal wall mass of left lower quadrant    Basal cell carcinoma    CAD (coronary artery disease) 2011   moderate   Colon polyp    DDD (degenerative disc disease)    Depression    Diabetes mellitus type II    no meds   GERD (gastroesophageal reflux disease)    Hyperlipidemia    Neuromuscular disorder (HCC)    Osteopenia    PAD (peripheral artery disease) (HCC)    Renal cell carcinoma    Restless leg syndrome    Sleep apnea    used a cpap yr ago-does not snore or use it now.   Assessment: 71 yo male on warfarin PTA for recent LV thrombus. He is also noted with ESRD and s/p emergent HD. S/p PCI 10/20, heparin held d/t 1 episode of coffee-ground emesis, no reports of bleeding since. Pharmacy consulted to bridge heparin to warfarin.   Heparin off with elevated INR. CBC stable, INR down after vitamin k given yesterday. No bleeding  issues noted. Patient cleared for po meds now. Will restart plavix and warfarin this evening.   Will need to be cautious with warfarin dosing given poor po intake lately.    Goal of Therapy:  INR 2-3 Monitor platelets by anticoagulation protocol: Yes   Plan:  Warfarin '1mg'$  tonight Retime plavix for today Daily INR  Erin Hearing PharmD., BCPS Clinical Pharmacist 07/28/2022 9:28 AM

## 2022-07-28 NOTE — Progress Notes (Signed)
Wife [name]) called to check on patient, she states she saw my previous note on MyChart, wife states that patient has history of PTSD and "gets like this" when not taking his Seroquel. Dr Broadus John notified via secure chat.

## 2022-07-28 NOTE — Procedures (Signed)
Seen and examined on dialysis.  Blood pressure 96/77 and HR 102. RIJ tunn catheter in use.  He is currently out of UF for hypotension.  Claudia Desanctis, MD 07/28/2022  2:05 PM

## 2022-07-28 NOTE — Progress Notes (Signed)
Physical Therapy Treatment Patient Details Name: Allen Cortez MRN: [id number] DOB: Mar 26, 1951 Today's Date: 07/28/2022   History of Present Illness Pt is 71 yo male admitted 07/18/22 with SOB and LE edema. Workup for significant volume overload secondary to CHF, ESRD. S/p urgent HD with ultrafiltration 10/16. CT  concerning for SBO 10/24-25. PMH includes ESRD (HD TTS), CHF, CAD, AAA repair, HTN, HLD, DM2, OSA, GERD, renal cell carcinoma with partial L nephrectomy, CKD3, abdominal wall mass, depression.    PT Comments    Pt was agreeable to get OOB today, but was limited by liquid BM with mobility, fatigue, and pain. Pt was only able to tolerate taking steps to transfer bed <> commode with a rollator and minA before requesting to return to bed. SpO2 noted to drop to as low as 81% on 3L O2 with mobility, but rebounded with resting. Will continue to follow acutely.      Recommendations for follow up therapy are one component of a multi-disciplinary discharge planning process, led by the attending physician.  Recommendations may be updated based on patient status, additional functional criteria and insurance authorization.  Follow Up Recommendations  Home health PT     Assistance Recommended at Discharge Frequent or constant Supervision/Assistance  Patient can return home with the following Help with stairs or ramp for entrance;Assistance with cooking/housework;A little help with walking and/or transfers;A little help with bathing/dressing/bathroom;Direct supervision/assist for financial management;Direct supervision/assist for medications management;Assist for transportation   Equipment Recommendations  Rollator (4 wheels);BSC/3in1    Recommendations for Other Services       Precautions / Restrictions Precautions Precautions: Fall;Other (comment) Precaution Comments: fatigue with LE's; watch SpO2 Restrictions Weight Bearing Restrictions: No     Mobility  Bed Mobility Overal  bed mobility: Needs Assistance Bed Mobility: Supine to Sit, Sit to Supine     Supine to sit: Min assist, HOB elevated Sit to supine: Min guard, HOB elevated   General bed mobility comments: MinA to bring legs off EOB to initiate pt coming to sit. Min guard for safety with return to supine.    Transfers Overall transfer level: Needs assistance Equipment used: Rollator (4 wheels) Transfers: Sit to/from Stand, Bed to chair/wheelchair/BSC Sit to Stand: Min assist   Step pivot transfers: Min assist       General transfer comment: MinA to power up to stand and steady from EOB 1x and from commode 1x, needing minA for stability and prevent LOB with stand step bed <> commode with noted knee buckling.    Ambulation/Gait Ambulation/Gait assistance: Min assist Gait Distance (Feet): 5 Feet (x2 bouts of ~3 ft > ~5 ft) Assistive device: Rollator (4 wheels) Gait Pattern/deviations: Step-through pattern, Decreased stride length, Trunk flexed, Knees buckling Gait velocity: decreased Gait velocity interpretation: <1.31 ft/sec, indicative of household ambulator   General Gait Details: Pt with small, unsteady steps bed <> commode using rollator. MinA for stability and to prevent LOB with noted trunk sway and knee buckling. Pt declined further gait attempts due to fatigue and deferred due to liquid BM that would occur with mobility   Stairs             Wheelchair Mobility    Modified Rankin (Stroke Patients Only)       Balance Overall balance assessment: Needs assistance Sitting-balance support: Feet supported Sitting balance-Leahy Scale: Fair Sitting balance - Comments: Min guard for static sitting balance   Standing balance support: Bilateral upper extremity supported, During functional activity Standing balance-Leahy Scale: Poor Standing balance  comment: Reliant on UE support and up to minA with noted knee buckling                            Cognition  Arousal/Alertness: Awake/alert Behavior During Therapy: Flat affect Overall Cognitive Status: Impaired/Different from baseline Area of Impairment: Safety/judgement, Awareness, Problem solving, Following commands, Attention                   Current Attention Level: Selective   Following Commands: Follows one step commands consistently, Follows one step commands with increased time, Follows multi-step commands inconsistently Safety/Judgement: Decreased awareness of safety Awareness: Intellectual Problem Solving: Slow processing, Requires verbal cues, Requires tactile cues, Difficulty sequencing General Comments: Pt with poor attention span at times, needing cues to attend to task at hand and maintain standing as needed for safety. Needs cues for sequencing mobility.        Exercises      General Comments General comments (skin integrity, edema, etc.): SpO2 down to as low as 81% on 3L O2 with mobility, rebounded with rest      Pertinent Vitals/Pain Pain Assessment Pain Assessment: Faces Faces Pain Scale: Hurts a little bit Pain Location: generalized with mobility Pain Descriptors / Indicators: Grimacing Pain Intervention(s): Monitored during session, Limited activity within patient's tolerance, Repositioned    Home Living                          Prior Function            PT Goals (current goals can now be found in the care plan section) Acute Rehab PT Goals Patient Stated Goal: to have BM PT Goal Formulation: With patient Time For Goal Achievement: 08/02/22 Potential to Achieve Goals: Good Progress towards PT goals: Not progressing toward goals - comment (limited by fatigue and pain)    Frequency    Min 3X/week      PT Plan Equipment recommendations need to be updated    Co-evaluation              AM-PAC PT "6 Clicks" Mobility   Outcome Measure  Help needed turning from your back to your side while in a flat bed without using  bedrails?: None Help needed moving from lying on your back to sitting on the side of a flat bed without using bedrails?: A Little Help needed moving to and from a bed to a chair (including a wheelchair)?: A Little Help needed standing up from a chair using your arms (e.g., wheelchair or bedside chair)?: A Little Help needed to walk in hospital room?: A Lot Help needed climbing 3-5 steps with a railing? : A Lot 6 Click Score: 17    End of Session Equipment Utilized During Treatment: Oxygen Activity Tolerance: Patient limited by fatigue Patient left: in bed;with call bell/phone within reach;with bed alarm set;with nursing/sitter in room Nurse Communication: Mobility status PT Visit Diagnosis: Pain;Difficulty in walking, not elsewhere classified (R26.2);Unsteadiness on feet (R26.81);Other abnormalities of gait and mobility (R26.89) Pain - Right/Left:  (both) Pain - part of body:  (Bil thighs)     Time: 8315-1761 PT Time Calculation (min) (ACUTE ONLY): 21 min  Charges:  $Therapeutic Activity: 8-22 mins                     Moishe Spice, PT, DPT Acute Rehabilitation Services  Office: [place] 07/28/2022, 12:46  PM

## 2022-07-28 NOTE — Progress Notes (Addendum)
Patient back from HD early as he is was being pulling tubes, trying to get up, agitated  Dr Broadus John notified.   Dr Broadus John stated to give his bedtime seroquel now, IV ativan.  Patient chewed up seroquel and took with water.  Spoke to Dr. Broadus John via secure chat, she approved meds to be crushed with a scoop of applesauce.

## 2022-07-28 NOTE — Progress Notes (Signed)
Spoke to Dr. Broadus John, Reino Bellis, NP and Margart Sickles, PA via secure chat about patient receiving important meds like coumadin, plavix, seroquel, per Margart Sickles, PA ok to give.

## 2022-07-28 NOTE — Progress Notes (Signed)
Mobility Specialist Progress Note   07/28/22 1501  Mobility  Activity Contraindicated/medical hold   At HD. Will f/u as time permits.  Allen Cortez, [place], [place]  Office: [phone]

## 2022-07-28 NOTE — Progress Notes (Addendum)
PROGRESS NOTE    Allen Cortez  KZL:[id number] DOB: 1951-09-18 DOA: 07/18/2022 PCP: Gerome Sam, MD  71 yo male with the past medical history of CAD, systolic heart failure, LV thrombus, right renal cancer sp partial left nephrectomy, T2DM, ESRD and chronic hypoxemic respiratory failure who presented with dyspnea. At home he had progressive dyspnea and orthopnea despite hemodialysis.  Wife reports ongoing decline since starting dialysis in August. -In the ED he was noted to be volume overloaded, chest x-ray noted bilateral interstitial edema, pleural effusions 10/17 Patient had urgent HD with ultrafiltration  10/19 HD. Plan for multidisciplinary cardiovascular meeting  to decide if patient will benefit more from PCI than CABG.  10/20 PCI, followed by Coffee ground emesis and abdominal pain post procedure. 1 unit PRBC transfusion.  10/21 GI consulted with recommendations of IV pantoprazole, high risk for endoscopic procedure.  10/22 PRBC transfusion due to low hgb below 8.  10/23 no further active bleeding, resumed anticoagulation.  10/24 tolerated HD, GI signed off 10/24-25: Increasing abdominal pain, vomiting and distention, CT overnight concerning for SBO 10/25: General surgery consulted, high risk, not surgical candidate and high risk for NG tube with upper GI bleed and anticoagulation with DAPT   Subjective: -Multiple loose stools last night, continues to have abdominal pain, nausea and distention  Assessment and Plan:  Acute on chronic systolic CHF -Echo with drop in EF to 30-35%, akinesis of LV apical segment wall, inferior wall, antral anterior wall and lateral wall, severe hypokinesis of left ventricle basal mid anterior wall, preserved RV systolic function -Volume managed with HD, -Continue carvedilol, hydralazine and Imdur-now with SBO, oral meds on hold  LV thrombus -Restarted on warfarin, INR was supratherapeutic at 4.5, yesterday, received vitamin K, now  down to 1.4, warfarin being resumed -Extremely complicated scenario with ongoing bleeding, LV thrombus and high risk of thromboembolic event  Upper GI bleed Acute blood loss anemia -Prior reports of coffee-ground emesis and melena -Hemoglobin chronically low, worsened this admission -Transfused 3 units of PRBC this admission -Treated with IV PPI, gastroenterology was consulted, felt to be high risk for endoscopic evaluation -Unfortunately needs dual antiplatelet therapy in the setting of new stent and warfarin for LV thrombus, high risk of further decompensation, hemoglobin stable today  Abdominal pain SBO -10/24 evening developed increasing abdominal pain with vomiting, CT concerning for small bowel obstruction with transition in the right lower quadrant, history of prior abdominal surgeries, could be secondary to ideations -High risk scenario, held off on NG tube with recent upper GI bleed,  -Appreciate general surgery evaluation, options are limited, repeat KUB pending -Discussed with wife by palliative care involvement given multisystem decompensations at this time  CAD with angina Elevated troponin Underwent left heart cath, proximal LAD to mid LAD was 100% stenosed, distal circumflex with 80% stenosis, successful PCI, stenting with DES to mid circumflex, recommendations to continue warfarin, aspirin for 1 month and Plavix for 6 months -Continue aspirin/Plavix/carvedilol statin -Then complicated by GI bleeding and now SBO  Acute kidney injury with acute tubular necrosis (ATN) (HCC) ESRD on HD. -Started hemodialysis in August -Admitted with volume overload, volume managed with HD, UF limited by cramping  Acute on chronic hypoxemic respiratory failure due to acute pulmonary edema -Wean O2 as tolerated  Type 2 diabetes mellitus with hyperlipidemia (HCC) -CBGs are stable, hold insulin therapy, oral intake is poor  GERD (gastroesophageal reflux disease) Continue PPI  PAD  (peripheral artery disease) ([place]) Continue with statin therapy.   Severe protein calorie  malnutrition -Albumin is 1.9, no history of liver disease, supplements as tolerated  Goals of care: Extensive list of medical problems in 70/M who started dialysis in August and has been subsequently declining, now admitted with worsening cardiomyopathy, LV thrombus, CAD requiring PCI and stenting on dual antiplatelet therapy and warfarin complicated by GI bleed, anemia and now SBO -Prognosis appears poor, severely malnourished his albumin is 1.9, discussed prognosis with wife yesterday -Palliative consulted for goals of care  DVT prophylaxis: SCDs Code Status: DNR Family Communication: Wife at bedside yesterday Disposition Plan:   Consultants: Nephrology, cardiology, palliative care, general surgery   Procedures: Cardiac MRI on 07/15/22 IMPRESSION: 1. Subendocardial LGE consistent with prior infarct in mid anterior/anteroseptal, apical anterior/septal/inferior/lateral walls, and apex. LGE is greater than 50% transmural suggesting nonviability in apical anterior/septal/inferior/lateral walls and apex. LGE <50% transmural in mid anterior wall but wall thickness <4.16m suggests nonviability. Findings are consistent with nonviability in mid to distal LAD territory 2. Severe LV dilatation with severe systolic dysfunction (EF 241%. Thinning/akinesis of mid to apical anterior/anteroseptal walls and apical inferior/lateral walls. Aneurysm of apex 3.  LV apical thrombus measures 159mx 68m62m.  Normal RV size and systolic function (EF 48%96%. Diffuse pulmonary opacities and bilateral pleural effusions (moderate on right, small on left), consider CT chest for further Evaluation.   Echo in 06/2022  1. 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  Left ventricular ejection fraction, by estimation, is  30-35%. The left  ventricle is abnormal. The left  ventricle has focal regional wall motion abnormalities. The left  ventricular internal cavity size was mildly dilated. There is akinesis of  the left ventricular, apical septal wall, inferior wall, anterior wall and  lateral wall. There is akinesis of the  left ventricular, entire apical segment. There is severe hypokinesis of  the left ventricular, basal-mid anterior wall. A falst tendon is present  in the mid LV cavity.   Cath: 07/22/22     Prox LAD to Mid LAD lesion is 100% stenosed.   Dist Cx lesion is 80% stenosed.   A drug-eluting stent was successfully placed using a SYNERGY XD [id number].   Post intervention, there is a 0% residual stenosis.   Successful PCI of the mid LCx with DES x 1     Plan: DAPT with ASA for one month, plavix for 12 months. OK to resume Coumadin tomorrow if no bleeding complications.     Antimicrobials:    Objective: Vitals:   07/26/22 2118 07/27/22 0554 07/27/22 2123 07/28/22 0457  BP: 101/72 101/65 120/84 114/80  Pulse: 92 95 (!) 101 (!) 101  Resp: '14 16 17 16  '$ Temp: 97.8 F (36.6 C) 97.8 F (36.6 C) 97.7 F (36.5 C) 97.7 F (36.5 C)  TempSrc: Oral Oral Oral Oral  SpO2: 99% 96% 95% 95%  Weight:      Height:       No intake or output data in the 24 hours ending 07/28/22 1102  Filed Weights   07/23/22 1217 07/26/22 0806 07/26/22 1214  Weight: 64.2 kg 63.7 kg 62.2 kg    Examination:  General exam: Frail chronically ill male appears much older than stated age, oriented to self and place HEENT: No JVD CVS: S1-S2, regular rhythm Lungs: Decreased breath sounds at the bases Abdomen: Firm, distended, mild diffuse tenderness, decreased bowel sounds Extremities: no edema Skin: No rashes Psychiatry: Flat  Data Reviewed:   CBC: Recent Labs  Lab 07/24/22 0145 07/24/22 1701 07/25/22 0213 07/26/22 0244 07/27/22 0456 07/28/22 0305  WBC 14.8*  --  8.3 5.7 9.9 14.1*  HGB 7.2* 8.1*  7.9* 8.2* 8.7* 9.3*  HCT 21.9* 24.8* 23.9* 25.0* 26.2* 27.4*  MCV 97.3  --  93.4 92.9 92.3 89.8  PLT 154  --  144* 196 213 175   Basic Metabolic Panel: Recent Labs  Lab 07/22/22 0541 07/23/22 0223 07/26/22 0244 07/27/22 0456 07/28/22 0642  NA 135 137 132* 136 137  K 3.7 4.7 5.5* 4.9 5.4*  CL 95* 91* 91* 93* 91*  CO2 '28 27 23 23 22  '$ GLUCOSE 94 125* 107* 112* 131*  BUN 20 40* 78* 52* 92*  CREATININE 3.97* 5.53* 7.20* 5.11* 6.73*  CALCIUM 8.4* 8.7* 8.1* 7.8* 6.8*  PHOS  --   --  6.0*  --   --    GFR: Estimated Creatinine Clearance: 8.7 mL/min (A) (by C-G formula based on SCr of 6.73 mg/dL (H)). Liver Function Tests: Recent Labs  Lab 07/26/22 0244 07/27/22 0456  AST  --  24  ALT  --  14  ALKPHOS  --  66  BILITOT  --  1.3*  PROT  --  5.6*  ALBUMIN 2.1* 1.9*   No results for input(s): "LIPASE", "AMYLASE" in the last 168 hours. No results for input(s): "AMMONIA" in the last 168 hours. Coagulation Profile: Recent Labs  Lab 07/24/22 0145 07/25/22 0729 07/26/22 0244 07/27/22 0456 07/28/22 0305  INR 1.9* 2.7* 3.4* 4.7* 1.4*   Cardiac Enzymes: No results for input(s): "CKTOTAL", "CKMB", "CKMBINDEX", "TROPONINI" in the last 168 hours. BNP (last 3 results) No results for input(s): "PROBNP" in the last 8760 hours. HbA1C: No results for input(s): "HGBA1C" in the last 72 hours. CBG: Recent Labs  Lab 07/27/22 0800 07/27/22 1228 07/27/22 1728 07/27/22 2129 07/28/22 0821  GLUCAP 124* 138* 140* 136* 131*   Lipid Profile: No results for input(s): "CHOL", "HDL", "LDLCALC", "TRIG", "CHOLHDL", "LDLDIRECT" in the last 72 hours. Thyroid Function Tests: No results for input(s): "TSH", "T4TOTAL", "FREET4", "T3FREE", "THYROIDAB" in the last 72 hours. Anemia Panel: No results for input(s): "VITAMINB12", "FOLATE", "FERRITIN", "TIBC", "IRON", "RETICCTPCT" in the last 72 hours. Urine analysis:    Component Value Date/Time   COLORURINE YELLOW 06/12/2022 1720   APPEARANCEUR  CLEAR 06/12/2022 1720   LABSPEC 1.010 06/12/2022 1720   PHURINE 5.0 06/12/2022 1720   GLUCOSEU NEGATIVE 06/12/2022 1720   HGBUR SMALL (A) 06/12/2022 1720   BILIRUBINUR NEGATIVE 06/12/2022 1720   KETONESUR NEGATIVE 06/12/2022 1720   PROTEINUR 30 (A) 06/12/2022 1720   UROBILINOGEN 0.2 12/23/2009 0857   NITRITE NEGATIVE 06/12/2022 1720   LEUKOCYTESUR NEGATIVE 06/12/2022 1720   Sepsis Labs: '@LABRCNTIP'$ (procalcitonin:4,lacticidven:4)  ) Recent Results (from the past 240 hour(s))  Resp Panel by RT-PCR (Flu A&B, Covid) Anterior Nasal Swab     Status: None   Collection Time: 07/18/22  4:28 PM   Specimen: Anterior Nasal Swab  Result Value Ref Range Status   SARS Coronavirus 2 by RT PCR NEGATIVE NEGATIVE Final    Comment: (NOTE) SARS-CoV-2 target nucleic acids are NOT DETECTED.  The SARS-CoV-2 RNA is generally detectable in upper respiratory specimens during the acute phase of infection. The lowest concentration of SARS-CoV-2 viral copies this assay can detect is 138 copies/mL. A negative result does not preclude SARS-Cov-2 infection and should not be used as the sole basis for treatment or other patient management decisions. A negative result  may occur with  improper specimen collection/handling, submission of specimen other than nasopharyngeal swab, presence of viral mutation(s) within the areas targeted by this assay, and inadequate number of viral copies(<138 copies/mL). A negative result must be combined with clinical observations, patient history, and epidemiological information. The expected result is Negative.  Fact Sheet for Patients:  EntrepreneurPulse.com.au  Fact Sheet for Healthcare Providers:  IncredibleEmployment.be  This test is no t yet approved or cleared by the [place] FDA and  has been authorized for detection and/or diagnosis of SARS-CoV-2 by FDA under an Emergency Use Authorization (EUA). This EUA will remain  in  effect (meaning this test can be used) for the duration of the COVID-19 declaration under Section 564(b)(1) of the Act, 21 U.S.C.section 360bbb-3(b)(1), unless the authorization is terminated  or revoked sooner.       Influenza A by PCR NEGATIVE NEGATIVE Final   Influenza B by PCR NEGATIVE NEGATIVE Final    Comment: (NOTE) The Xpert Xpress SARS-CoV-2/FLU/RSV plus assay is intended as an aid in the diagnosis of influenza from Nasopharyngeal swab specimens and should not be used as a sole basis for treatment. Nasal washings and aspirates are unacceptable for Xpert Xpress SARS-CoV-2/FLU/RSV testing.  Fact Sheet for Patients: EntrepreneurPulse.com.au  Fact Sheet for Healthcare Providers: IncredibleEmployment.be  This test is not yet approved or cleared by the [place] FDA and has been authorized for detection and/or diagnosis of SARS-CoV-2 by FDA under an Emergency Use Authorization (EUA). This EUA will remain in effect (meaning this test can be used) for the duration of the COVID-19 declaration under Section 564(b)(1) of the Act, 21 U.S.C. section 360bbb-3(b)(1), unless the authorization is terminated or revoked.  Performed at [facility], [place] [address] [place], [place] [address]      Radiology Studies: DG Abd Portable 1V-Small Bowel Obstruction Protocol-initial, 8 hr delay  Result Date: 07/27/2022 CLINICAL DATA:  8 hour small-bowel follow-up film EXAM: PORTABLE ABDOMEN - 1 VIEW COMPARISON:  CT from earlier in the same day. FINDINGS: Scattered large and small bowel gas is noted. Contrast material administered previously now lies predominately within the small bowel which shows dilatation similar to that seen on the prior CT. No contrast is noted within the colon. IMPRESSION: Contrast administered lies within dilated small bowel loops. No colonic contrast is seen. 24 hour follow-up film is recommended. Electronically Signed   By:  [name] M.D.   On: 07/27/2022 21:01   CT ABDOMEN PELVIS WO CONTRAST  Result Date: 07/27/2022 CLINICAL DATA:  Abdominal pain. History of renal cell carcinoma status post partial left nephrectomy. EXAM: CT ABDOMEN AND PELVIS WITHOUT CONTRAST TECHNIQUE: Multidetector CT imaging of the abdomen and pelvis was performed following the standard protocol without IV contrast. RADIATION DOSE REDUCTION: This exam was performed according to the departmental dose-optimization program which includes automated exposure control, adjustment of the mA and/or kV according to patient size and/or use of iterative reconstruction technique. COMPARISON:  CT AP 03/22/2022 and 04/07/2017. FINDINGS: Lower chest: There is a small right pleural effusion. Peripheral airspace consolidation within the right lower lobe is identified. Ground-glass attenuation and interstitial thickening concerning for mild edema. Cardiac enlargement and aortic atherosclerosis. Hepatobiliary: Cyst within right lobe of liver is unchanged measuring 0.9 cm. Gallbladder negative. No signs of bile duct dilatation. Pancreas: Unremarkable. No pancreatic ductal dilatation or surrounding inflammatory changes. Spleen: Normal in size without focal abnormality. Adrenals/Urinary Tract: Normal adrenal glands. Bilateral, benign Bosniak class 1 and 2 kidney cysts measure up to  2.4 cm. No follow-up imaging recommended. Postoperative change identified along the lateral aspect of the left kidney compatible with prior partial nephrectomy. Calcification within the left retroperitoneum adjacent to the lower pole of left kidney compatible with chronic fat necrosis. Unchanged. No nephrolithiasis or signs of hydronephrosis. There is high attenuation material within the urinary bladder which may reflect recently administered gadolinium contrast agent. No focal bladder abnormality noted. Stomach/Bowel: Stomach appears normal. Multiple dilated loops of small bowel are identified along  with small bowel air-fluid levels. The small bowel loops measure up to 4.2 cm. Transition to decreased caliber distal small bowel noted in the right lower quadrant of the abdomen, image 68/4. Terminal ileum has a normal caliber. The appendix is visualized and appears normal. Normal appearance of the colon. Vascular/Lymphatic: Extensive aortic atherosclerosis. Infrarenal abdominal aortic aneurysm is identified measuring 3.4 cm and maximum AP diameter, image 34/4. No signs of abdominopelvic adenopathy. Reproductive: Prostate is unremarkable. Other: No free fluid or fluid collections. No signs of pneumoperitoneum. Musculoskeletal: No acute or significant osseous findings. IMPRESSION: 1. Examination is positive for small bowel obstruction with transition to decreased caliber distal small bowel in the right lower quadrant of the abdomen. 2. Small right pleural effusion with peripheral airspace consolidation within the right lower lobe. Correlate for signs of right lower lobe inflammation/infection 3. Infrarenal abdominal aortic aneurysm measuring 3.4 cm and maximum AP diameter. Recommend follow-up every 3 years. Aortic aneurysm NOS (ICD10-I71.9). 4.  Aortic Atherosclerosis (ICD10-I70.0). These results will be called to the ordering clinician or representative by the Radiologist Assistant, and communication documented in the PACS or [employer]. Electronically Signed   By: Kerby Moors M.D.   On: 07/27/2022 05:46   DG Chest 1 View  Result Date: 07/26/2022 CLINICAL DATA:  Provided history: Dyspnea. EXAM: CHEST  1 VIEW COMPARISON:  Prior chest radiographs 07/25/2022 and earlier. FINDINGS: A portion of the left lateral costophrenic angle is excluded from the field of view. Unchanged position of a right-sided central venous catheter with tip terminating at the level of the upper right atrium. Cardiomegaly. Bilateral interstitial and ill-defined airspace opacities compatible with edema, similar to slightly  decreased. Persistent small right pleural effusion. No evidence of pneumothorax. IMPRESSION: A portion of the left costophrenic angle is excluded from the field of view. Pulmonary edema, similar to slightly decreased from the prior examination of 07/25/2022. Persistent small right pleural effusion. Cardiomegaly, unchanged. Electronically Signed   By: Kellie Simmering D.O.   On: 07/26/2022 16:34     Scheduled Meds:  atorvastatin  40 mg Oral QHS   bisacodyl  10 mg Oral Q1200   carvedilol  6.25 mg Oral BID WC   Chlorhexidine Gluconate Cloth  6 each Topical Q0600   Chlorhexidine Gluconate Cloth  6 each Topical Q0600   citalopram  20 mg Oral QHS   clonazePAM  0.5 mg Oral QHS   clopidogrel  75 mg Oral Q breakfast   [START ON 07/30/2022] darbepoetin (ARANESP) injection - DIALYSIS  150 mcg Subcutaneous Q Sat-1800   docusate sodium  100 mg Oral BID   feeding supplement (NEPRO CARB STEADY)  237 mL Oral TID BM   guaiFENesin  600 mg Oral BID   hydrALAZINE  25 mg Oral BID   insulin aspart  0-6 Units Subcutaneous TID AC & HS   isosorbide dinitrate  20 mg Oral BID   multivitamin  1 tablet Oral QHS   pantoprazole (PROTONIX) IV  40 mg Intravenous Q12H   polyethylene glycol  17  g Oral BID   QUEtiapine  300 mg Oral QHS   sodium chloride flush  3 mL Intravenous Q12H   Continuous Infusions:  sodium chloride     promethazine (PHENERGAN) injection (IM or IVPB) Stopped (07/23/22 1026)     LOS: 10 days    Time spent: 33mn  PDomenic Polite MD [facility]   07/28/2022, 11:02 AM

## 2022-07-29 ENCOUNTER — Ambulatory Visit: Payer: No Typology Code available for payment source | Admitting: Physician Assistant

## 2022-07-29 ENCOUNTER — Inpatient Hospital Stay (HOSPITAL_COMMUNITY): Payer: No Typology Code available for payment source

## 2022-07-29 DIAGNOSIS — R410 Disorientation, unspecified: Secondary | ICD-10-CM

## 2022-07-29 DIAGNOSIS — I5023 Acute on chronic systolic (congestive) heart failure: Secondary | ICD-10-CM | POA: Diagnosis not present

## 2022-07-29 DIAGNOSIS — K56609 Unspecified intestinal obstruction, unspecified as to partial versus complete obstruction: Secondary | ICD-10-CM | POA: Diagnosis not present

## 2022-07-29 DIAGNOSIS — Z7189 Other specified counseling: Secondary | ICD-10-CM

## 2022-07-29 DIAGNOSIS — F4321 Adjustment disorder with depressed mood: Secondary | ICD-10-CM | POA: Diagnosis present

## 2022-07-29 DIAGNOSIS — E1169 Type 2 diabetes mellitus with other specified complication: Secondary | ICD-10-CM | POA: Diagnosis not present

## 2022-07-29 DIAGNOSIS — F4310 Post-traumatic stress disorder, unspecified: Secondary | ICD-10-CM

## 2022-07-29 DIAGNOSIS — I25119 Atherosclerotic heart disease of native coronary artery with unspecified angina pectoris: Secondary | ICD-10-CM | POA: Diagnosis not present

## 2022-07-29 DIAGNOSIS — N170 Acute kidney failure with tubular necrosis: Secondary | ICD-10-CM | POA: Diagnosis not present

## 2022-07-29 LAB — CBC
HCT: 24.7 % — ABNORMAL LOW (ref 39.0–52.0)
Hemoglobin: 8.1 g/dL — ABNORMAL LOW (ref 13.0–17.0)
MCH: 29.9 pg (ref 26.0–34.0)
MCHC: 32.8 g/dL (ref 30.0–36.0)
MCV: 91.1 fL (ref 80.0–100.0)
Platelets: 204 10*3/uL (ref 150–400)
RBC: 2.71 MIL/uL — ABNORMAL LOW (ref 4.22–5.81)
RDW: 20.2 % — ABNORMAL HIGH (ref 11.5–15.5)
WBC: 15.5 10*3/uL — ABNORMAL HIGH (ref 4.0–10.5)
nRBC: 0.3 % — ABNORMAL HIGH (ref 0.0–0.2)

## 2022-07-29 LAB — BASIC METABOLIC PANEL
Anion gap: 16 — ABNORMAL HIGH (ref 5–15)
BUN: 46 mg/dL — ABNORMAL HIGH (ref 8–23)
CO2: 24 mmol/L (ref 22–32)
Calcium: 7.2 mg/dL — ABNORMAL LOW (ref 8.9–10.3)
Chloride: 95 mmol/L — ABNORMAL LOW (ref 98–111)
Creatinine, Ser: 4.1 mg/dL — ABNORMAL HIGH (ref 0.61–1.24)
GFR, Estimated: 15 mL/min — ABNORMAL LOW (ref >60)
Glucose, Bld: 96 mg/dL (ref 70–99)
Potassium: 4.5 mmol/L (ref 3.5–5.1)
Sodium: 135 mmol/L (ref 135–145)

## 2022-07-29 LAB — PROTIME-INR
INR: 1.3 — ABNORMAL HIGH (ref 0.8–1.2)
Prothrombin Time: 16.1 seconds — ABNORMAL HIGH (ref 11.4–15.2)

## 2022-07-29 LAB — GLUCOSE, CAPILLARY
Glucose-Capillary: 109 mg/dL — ABNORMAL HIGH (ref 70–99)
Glucose-Capillary: 97 mg/dL (ref 70–99)
Glucose-Capillary: 119 mg/dL — ABNORMAL HIGH (ref 70–99)

## 2022-07-29 MED ORDER — WARFARIN SODIUM 2 MG PO TABS
3.0000 mg | ORAL_TABLET | Freq: Once | ORAL | Status: AC
  Administered 2022-07-29: 3 mg via ORAL
  Filled 2022-07-29: qty 1

## 2022-07-29 MED ORDER — SODIUM ZIRCONIUM CYCLOSILICATE 10 G PO PACK
10.0000 g | PACK | Freq: Once | ORAL | Status: AC
  Administered 2022-07-29: 10 g via ORAL
  Filled 2022-07-29: qty 1

## 2022-07-29 NOTE — Progress Notes (Addendum)
Rounding Note    Patient Name: Allen Cortez Date of Encounter: 07/29/2022  [place] Cardiologist: Shelva Majestic, MD   Subjective   He has a sitter present. Denies pain. Abdomen soft  Inpatient Medications    Scheduled Meds:  atorvastatin  40 mg Oral QHS   bisacodyl  10 mg Oral Q1200   carvedilol  6.25 mg Oral BID WC   Chlorhexidine Gluconate Cloth  6 each Topical Q0600   Chlorhexidine Gluconate Cloth  6 each Topical Q0600   citalopram  20 mg Oral QHS   clonazePAM  0.5 mg Oral QHS   clopidogrel  75 mg Oral Q breakfast   [START ON 07/30/2022] darbepoetin (ARANESP) injection - DIALYSIS  150 mcg Subcutaneous Q Sat-1800   docusate sodium  100 mg Oral BID   feeding supplement (NEPRO CARB STEADY)  237 mL Oral TID BM   guaiFENesin  600 mg Oral BID   hydrALAZINE  25 mg Oral BID   insulin aspart  0-6 Units Subcutaneous TID AC & HS   isosorbide dinitrate  20 mg Oral BID   multivitamin  1 tablet Oral QHS   pantoprazole (PROTONIX) IV  40 mg Intravenous Q12H   polyethylene glycol  17 g Oral BID   QUEtiapine  300 mg Oral QHS   sodium chloride flush  3 mL Intravenous Q12H   Warfarin - Pharmacist Dosing Inpatient   Does not apply q1600   Continuous Infusions:  sodium chloride     promethazine (PHENERGAN) injection (IM or IVPB) Stopped (07/23/22 1026)   PRN Meds: sodium chloride, acetaminophen **OR** acetaminophen, albuterol, HYDROmorphone (DILAUDID) injection, LORazepam, ondansetron (ZOFRAN) IV, mouth rinse, promethazine (PHENERGAN) injection (IM or IVPB), sodium chloride flush   Vital Signs    Vitals:   07/28/22 1911 07/28/22 2210 07/28/22 2322 07/29/22 0423  BP: 113/74  90/60 111/69  Pulse: (!) 105 99 93 (!) 103  Resp: '20  18 17  '$ Temp: 98.6 F (37 C)     TempSrc: Oral     SpO2:  99% 100% 94%  Weight:      Height:        Intake/Output Summary (Last 24 hours) at 07/29/2022 0642 Last data filed at 07/28/2022 1700 Gross per 24 hour  Intake --  Output  700 ml  Net -700 ml      07/28/2022    5:22 PM 07/28/2022    1:29 PM 07/26/2022   12:14 PM  Last 3 Weights  Weight (lbs) 130 lb 15.3 oz 134 lb 7.7 oz 137 lb 2 oz  Weight (kg) 59.4 kg 61 kg 62.2 kg      Telemetry    Sinus rhythm with HR 80-90s - Personally Reviewed  ECG    No new tracings - Personally Reviewed  Physical Exam   GEN: No acute distress.   Neck: No JVD Cardiac: RRR, no murmurs, rubs, or gallops.  Respiratory: rhonchus. GI: Soft, nontender, non-distended  MS: No edema; toes dusky on left foot, weak pulse Neuro:  Nonfocal  Psych: Normal affect   Labs    High Sensitivity Troponin:   Recent Labs  Lab 07/18/22 1653 07/18/22 1845 07/18/22 2045 07/19/22 0454  TROPONINIHS 266* 271* 256* 288*     Chemistry Recent Labs  Lab 07/26/22 0244 07/27/22 0456 07/28/22 0642 07/29/22 0346  NA 132* 136 137 135  K 5.5* 4.9 5.4* 4.5  CL 91* 93* 91* 95*  CO2 '23 23 22 24  '$ GLUCOSE 107* 112* 131* 96  BUN 78* 52* 92* 46*  CREATININE 7.20* 5.11* 6.73* 4.10*  CALCIUM 8.1* 7.8* 6.8* 7.2*  PROT  --  5.6*  --   --   ALBUMIN 2.1* 1.9*  --   --   AST  --  24  --   --   ALT  --  14  --   --   ALKPHOS  --  66  --   --   BILITOT  --  1.3*  --   --   GFRNONAA 7* 11* 8* 15*  ANIONGAP 18* 20* 24* 16*    Lipids No results for input(s): "CHOL", "TRIG", "HDL", "LABVLDL", "LDLCALC", "CHOLHDL" in the last 168 hours.  Hematology Recent Labs  Lab 07/27/22 0456 07/28/22 0305 07/29/22 0346  WBC 9.9 14.1* 15.5*  RBC 2.84* 3.05* 2.71*  HGB 8.7* 9.3* 8.1*  HCT 26.2* 27.4* 24.7*  MCV 92.3 89.8 91.1  MCH 30.6 30.5 29.9  MCHC 33.2 33.9 32.8  RDW 19.9* 19.9* 20.2*  PLT 213 249 204   Thyroid No results for input(s): "TSH", "FREET4" in the last 168 hours.  BNPNo results for input(s): "BNP", "PROBNP" in the last 168 hours.  DDimer No results for input(s): "DDIMER" in the last 168 hours.   Radiology    DG Abd Portable 1V-Small Bowel Obstruction Protocol-24 hr delay  Result  Date: 07/28/2022 CLINICAL DATA:  Small-bowel obstruction. EXAM: PORTABLE ABDOMEN - 1 VIEW COMPARISON:  Abdominal radiographs dated 07/27/2022 and CT abdomen and pelvis dated 07/27/2022 FINDINGS: Enteric contrast appears to reach the hepatic flexure, suggestive of a partial small-bowel obstruction or resolving small bowel obstruction, however multiple dilated loops of small bowel are redemonstrated. IMPRESSION: Enteric contrast appears to reach the hepatic flexure, suggestive of a partial small-bowel obstruction or resolving small bowel obstruction given that multiple loops of small bowel remain dilated. Electronically Signed   By: Zerita Boers M.D.   On: 07/28/2022 13:52   DG Abd Portable 1V-Small Bowel Obstruction Protocol-initial, 8 hr delay  Result Date: 07/27/2022 CLINICAL DATA:  8 hour small-bowel follow-up film EXAM: PORTABLE ABDOMEN - 1 VIEW COMPARISON:  CT from earlier in the same day. FINDINGS: Scattered large and small bowel gas is noted. Contrast material administered previously now lies predominately within the small bowel which shows dilatation similar to that seen on the prior CT. No contrast is noted within the colon. IMPRESSION: Contrast administered lies within dilated small bowel loops. No colonic contrast is seen. 24 hour follow-up film is recommended. Electronically Signed   By: [name] M.D.   On: 07/27/2022 21:01    Cardiac Studies   Cardiac MRI on 07/15/22 IMPRESSION: 1. Subendocardial LGE consistent with prior infarct in mid anterior/anteroseptal, apical anterior/septal/inferior/lateral walls, and apex. LGE is greater than 50% transmural suggesting nonviability in apical anterior/septal/inferior/lateral walls and apex. LGE <50% transmural in mid anterior wall but wall thickness <4.29m suggests nonviability. Findings are consistent with nonviability in mid to distal LAD territory 2. Severe LV dilatation with severe systolic dysfunction (EF 284%. Thinning/akinesis of  mid to apical anterior/anteroseptal walls and apical inferior/lateral walls. Aneurysm of apex 3.  LV apical thrombus measures 155mx 56m45m.  Normal RV size and systolic function (EF 48%13%. Diffuse pulmonary opacities and bilateral pleural effusions (moderate on right, small on left), consider CT chest for further Evaluation.   Echo in 06/2022  1. There is a large apical aneurysm with a mobile lucency in the apex  measuring 1.07 x 1.72cm consistent with LV thrombus by definity  contrast.  Left ventricular ejection fraction, by estimation, is 30-35%. The left  ventricle is abnormal. The left  ventricle has focal regional wall motion abnormalities. The left  ventricular internal cavity size was mildly dilated. There is akinesis of  the left ventricular, apical septal wall, inferior wall, anterior wall and  lateral wall. There is akinesis of the  left ventricular, entire apical segment. There is severe hypokinesis of  the left ventricular, basal-mid anterior wall. A falst tendon is present  in the mid LV cavity.   Cath: 07/22/22     Prox LAD to Mid LAD lesion is 100% stenosed.   Dist Cx lesion is 80% stenosed.   A drug-eluting stent was successfully placed using a SYNERGY XD 3.0X20.   Post intervention, there is a 0% residual stenosis.   Successful PCI of the mid LCx with DES x 1     Plan: DAPT with ASA for one month, plavix for 12 months. OK to resume Coumadin tomorrow if no bleeding complications.     Diagnostic Dominance: Left  Intervention       Patient Profile     71 y.o. male with a PMHx of nonobstructive CAD 2011, AAA s/p repair 2013, HTN, HLD, T2DM, RCC s/p partial left nephrectomy 2015, OSA, GERD, RLS. who is being seen 07/18/2022 for the evaluation of acute on chronic HFrEF, NSTEMI and known CAD at the request of the primary hospitalist service.  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 now on  HD Nephrology following HD yesterday   Chronic systolic heart failure Ischemic cardiomyopathy LV thrombus Hypertension  - echo 06/12/22 with LVEF 30-35% and evidence of LV thrombus, WMA with akinesis of the anterior wall - cMRI confirmed thrombus - GDMT complicated by SBO - regimen may include coreg 6.25 mg BID, hydralazine 25 mg BID, isordil 20 mg BID - no ACEI/ARB/ARNI/SGLT2i given renal function - BP remains marginal with ST - consider transitioning coreg to toprol for more BP room to control HR   Coumadin therapy INR supratherapeutic now s/p Vit K with INR 1.4 Coumadin restarted last evening - 1 mg INR today 1.3 Caution given GI bleed   CAD  s/p DES-LCX, CTO of LAD Hyperlipidemia with LDL goal < 70 cMRI 10/13 showed no viabile myocardium Per Dr. Kipp Brood, not a candidate for CABG Given this, he proceeded with DES-LCX 07/22/22 Triple therapy with ASA, plavix, and coumadin x 30 days, then discontinued ASA 06/15/2022: Cholesterol 113; HDL 26; LDL Cholesterol 60; Triglycerides 137; VLDL 27 LP (a) 131 Medications as above: 40 mg lipitor, BB, isordil, and hydralazine   Toe discoloration Toes dusky on left foot, weak pedal pulse Consider VVS consult    Bilateral leg pain Worse with exertion and when hypoxic Reassuring LE dopplers 07/11/22   Anemia GI bleed SBO Per primary Appreciate input from palliative care team   Cardiology follow up has been arranged.      For questions or updates, please contact [place] Please consult [email or web address] for contact info under        Signed, Ledora Bottcher, PA  07/29/2022, 6:42 AM     Patient seen and examined. Agree with assessment and plan.  Patient currently sleeping.  Sitter is present.  Blood pressure sinus rhythm at 92.  Blood pressure stable.  No JVD.  Regular rhythm.  Bowel sounds positive.  Slightly mottled first and second toe of left foot.  He is back  on warfarin and Plavix.  Small bowel  obstruction resolved.  Had bowel movement last night.  Contrast noted in colon on imaging.  He had hemodialysis yesterday.   Troy Sine, MD, [facility] 07/29/2022 10:37 AM

## 2022-07-29 NOTE — Progress Notes (Signed)
ANTICOAGULATION CONSULT NOTE  Pharmacy Consult for Coumadin Indication: LV thrombus  Allergies  Allergen Reactions   Dilaudid [Hydromorphone Hcl] Other (See Comments)    hallucinations   Mirapex [Pramipexole Dihydrochloride] Other (See Comments)    Leg pain   Prednisone Other (See Comments)    irritability   Gabapentin     Other reaction(s): Tremor    Patient Measurements: Height: '5\' 10"'$  (177.8 cm) Weight: 59.4 kg (130 lb 15.3 oz) IBW/kg (Calculated) : 73  Vital Signs: Temp: 97.8 F (36.6 C) (10/27 0759) Temp Source: Axillary (10/27 0759) BP: 106/69 (10/27 0759) Pulse Rate: 97 (10/27 0759)  Labs: Recent Labs    07/27/22 0456 07/28/22 0305 07/28/22 0642 07/29/22 0346  HGB 8.7* 9.3*  --  8.1*  HCT 26.2* 27.4*  --  24.7*  PLT 213 249  --  204  LABPROT 43.8* 17.1*  --  16.1*  INR 4.7* 1.4*  --  1.3*  CREATININE 5.11*  --  6.73* 4.10*     Estimated Creatinine Clearance: 13.7 mL/min (A) (by C-G formula based on SCr of 4.1 mg/dL (H)).   Medical History: Past Medical History:  Diagnosis Date   AAA (abdominal aortic aneurysm) (HCC)    Abdominal wall mass of left lower quadrant    Basal cell carcinoma    CAD (coronary artery disease) 2011   moderate   Colon polyp    DDD (degenerative disc disease)    Depression    Diabetes mellitus type II    no meds   GERD (gastroesophageal reflux disease)    Hyperlipidemia    Neuromuscular disorder (HCC)    Osteopenia    PAD (peripheral artery disease) (HCC)    Renal cell carcinoma    Restless leg syndrome    Sleep apnea    used a cpap yr ago-does not snore or use it now.   Assessment: 71 yo male on warfarin PTA for recent LV thrombus. He is also noted with ESRD and s/p emergent HD. S/p PCI 10/20, heparin held d/t 1 episode of coffee-ground emesis, no reports of bleeding since. Pharmacy consulted to bridge heparin to warfarin.   Heparin off with elevated INR and concern for GIB. Hemoglobin down this am to 8.1, no signs  or symptoms of bleeding. INR down after vitamin k given 10/25, low at 1.3 this am. Patient cleared for po meds now. Restarted plavix and warfarin yesterday.   Will need to be cautious with warfarin dosing given poor po intake lately.    Goal of Therapy:  INR 2-3 Monitor platelets by anticoagulation protocol: Yes   Plan:  Warfarin '3mg'$  tonight Continue plavix Daily INR, watch hemoglobin  Erin Hearing PharmD., BCPS Clinical Pharmacist 07/29/2022 8:43 AM
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
## 2022-07-29 NOTE — Progress Notes (Signed)
CARDIAC REHAB PHASE I       Post stent education provided earlier in week. Will sign off for now as pt does not meet CRP1 criteria at present. Available if needs of pt change during this hospitalization.   [id number]  Vanessa Barbara, RN BSN 07/29/2022 9:11 AM

## 2022-07-29 NOTE — Progress Notes (Signed)
Mobility Specialist - Progress Note   07/29/22 0939  Mobility  Activity Refused mobility   Pt hard to arouse and not waking up. Will follow up.   Larey Seat

## 2022-07-29 NOTE — Progress Notes (Addendum)
[facility] Progress Note   Subjective:   Seen in room, wife at bedside. Has sitter. He's drowsy. Denies cp, dyspnea.  Objective Vitals:   07/29/22 0423 07/29/22 0759 07/29/22 0800 07/29/22 1122  BP: 111/69 106/69 106/69 110/73  Pulse: (!) 103 97 95 (!) 153  Resp: 17 18    Temp:  97.8 F (36.6 C)    TempSrc:  Axillary    SpO2: 94% 100% 97% 99%  Weight:      Height:       Physical Exam General: chronically ill appearing male in NAD Heart: RRR, no mrg Lungs: +rhonchi, nml WOB on 3L O2 via [place] Abdomen: soft, non-tender  Extremities: no LE edema Dialysis Access: [facility]   Filed Weights   07/26/22 1214 07/28/22 1329 07/28/22 1722  Weight: 62.2 kg 61 kg 59.4 kg    Intake/Output Summary (Last 24 hours) at 07/29/2022 1141 Last data filed at 07/28/2022 1700 Gross per 24 hour  Intake --  Output 700 ml  Net -700 ml    Additional Objective Labs: Basic Metabolic Panel: Recent Labs  Lab 07/26/22 0244 07/27/22 0456 07/28/22 0642 07/29/22 0346  NA 132* 136 137 135  K 5.5* 4.9 5.4* 4.5  CL 91* 93* 91* 95*  CO2 _0 GLUCOSE 107* 112* 131* 96  BUN 78* 52* 92* 46*  CREATININE 7.20* 5.11* 6.73* 4.10*  CALCIUM 8.1* 7.8* 6.8* 7.2*  PHOS 6.0*  --   --   --     Liver Function Tests: Recent Labs  Lab 07/26/22 0244 07/27/22 0456  AST  --  24  ALT  --  14  ALKPHOS  --  66  BILITOT  --  1.3*  PROT  --  5.6*  ALBUMIN 2.1* 1.9*     CBC: Recent Labs  Lab 07/25/22 0213 07/26/22 0244 07/27/22 0456 07/28/22 0305 07/29/22 0346  WBC 8.3 5.7 9.9 14.1* 15.5*  HGB 7.9* 8.2* 8.7* 9.3* 8.1*  HCT 23.9* 25.0* 26.2* 27.4* 24.7*  MCV 93.4 92.9 92.3 89.8 91.1  PLT 144* 196 213 249 204    CBG: Recent Labs  Lab 07/28/22 1208 07/28/22 1738 07/28/22 2207 07/29/22 0809 07/29/22 1129  GLUCAP 134* 94 91 109* 97    Studies/Results: DG Abd Portable 1V  Result Date: 07/29/2022 CLINICAL DATA:  Small-bowel obstruction in a 71 year old male. EXAM:  PORTABLE ABDOMEN - 1 VIEW COMPARISON:  July 28, 2022. FINDINGS: Central venous access device incidentally noted in the lower chest terminating in the RIGHT atrium. Visualized lung bases are clear. Persistent moderate to marked small bowel distension throughout the abdomen. Dilution or further passage of contrast media into the colon. Question slightly decreased dilation of small bowel loops in the RIGHT lower quadrant. Signs of some gas and stool in the colon with similar distribution. Rectum not visualized. On limited assessment no acute skeletal findings. IMPRESSION: Signs of persistent presumed partial small bowel obstruction. Perhaps mildly diminished dilation of small bowel loops in the RIGHT lower quadrant, bowel loops in the LEFT hemiabdomen and central abdomen shows stable moderate to marked dilation. Electronically Signed   By: Zetta Bills M.D.   On: 07/29/2022 08:26   DG Abd Portable 1V-Small Bowel Obstruction Protocol-24 hr delay  Result Date: 07/28/2022 CLINICAL DATA:  Small-bowel obstruction. EXAM: PORTABLE ABDOMEN - 1 VIEW COMPARISON:  Abdominal radiographs dated 07/27/2022 and CT abdomen and pelvis dated 07/27/2022 FINDINGS: Enteric contrast appears to reach the hepatic flexure, suggestive of a partial small-bowel obstruction or resolving  small bowel obstruction, however multiple dilated loops of small bowel are redemonstrated. IMPRESSION: Enteric contrast appears to reach the hepatic flexure, suggestive of a partial small-bowel obstruction or resolving small bowel obstruction given that multiple loops of small bowel remain dilated. Electronically Signed   By: Zerita Boers M.D.   On: 07/28/2022 13:52   DG Abd Portable 1V-Small Bowel Obstruction Protocol-initial, 8 hr delay  Result Date: 07/27/2022 CLINICAL DATA:  8 hour small-bowel follow-up film EXAM: PORTABLE ABDOMEN - 1 VIEW COMPARISON:  CT from earlier in the same day. FINDINGS: Scattered large and small bowel gas is noted.  Contrast material administered previously now lies predominately within the small bowel which shows dilatation similar to that seen on the prior CT. No contrast is noted within the colon. IMPRESSION: Contrast administered lies within dilated small bowel loops. No colonic contrast is seen. 24 hour follow-up film is recommended. Electronically Signed   By: [name] M.D.   On: 07/27/2022 21:01    Medications:  sodium chloride     promethazine (PHENERGAN) injection (IM or IVPB) Stopped (07/23/22 1026)    atorvastatin  40 mg Oral QHS   bisacodyl  10 mg Oral Q1200   carvedilol  6.25 mg Oral BID WC   Chlorhexidine Gluconate Cloth  6 each Topical Q0600   citalopram  20 mg Oral QHS   clonazePAM  0.5 mg Oral QHS   clopidogrel  75 mg Oral Q breakfast   [START ON 07/30/2022] darbepoetin (ARANESP) injection - DIALYSIS  150 mcg Subcutaneous Q Sat-1800   docusate sodium  100 mg Oral BID   feeding supplement (NEPRO CARB STEADY)  237 mL Oral TID BM   guaiFENesin  600 mg Oral BID   hydrALAZINE  25 mg Oral BID   insulin aspart  0-6 Units Subcutaneous TID AC & HS   isosorbide dinitrate  20 mg Oral BID   multivitamin  1 tablet Oral QHS   pantoprazole (PROTONIX) IV  40 mg Intravenous Q12H   polyethylene glycol  17 g Oral BID   QUEtiapine  300 mg Oral QHS   sodium chloride flush  3 mL Intravenous Q12H   Warfarin - Pharmacist Dosing Inpatient   Does not apply q1600    Dialysis Orders: [place] TTS 4h 400/A1.5x EDW 70kg 3K/2.25Ca TDC Heparin 2000 U    Assessment/Plan: Acute hypoxic resp failure - likely related to volume overload. CXR w/CHF and b/l pleural effusions.  Improved, now under dry weight.  Repeat CXR 10/23 with persistent cardiomegaly and pulmonary edema. Probable layering pleural fluid.  UF limited by cramping, continue to titrate down as tolerated.  AKI on CKD 3b - HD dependent for 6 weeks- this likely means ESRD. Continue HD TTS. Next HD Sat. Hx severe leg pain with dialysis.  At outpt HD he  actually takes Oxycodone prescribed by cardiology for the leg pain per family.  SBO - identified on CT 10/24.  Surgery consulted and patient is not good candidate for surgery or NG tube placement.  On bowel rest.  HTN - BP ok. Coreg 6.11m BID, hydralazine 218mBID, and isordil 2079mID.  d/c home lasix d/t minimal to no UOP. CAD -CTS consulted - no plans for surgery at this time, high risk.  s/p DES to circ on 10/20. Needs triple AC therapy - ASA, plavix and coumadin x 1 month, then just plavix, coumadin. Per GI ok with ASA, plavix therapy continued as long as no overt bleeding, hold coumadin if ongoing drop in Hgb,  ok to restart heparin if Hgb stable.  Per cardiology A/C systolic CHF - echo with EF 30-35%.  Meds limited by kidney function.  Volume removal with HD.  Anemia-  Hgb 8.1   3 units prbcs since admission. Tsat 19%, getting iron load.  ESA 150 qSat. Bones-  CorrCa ok. Watch Phos   Not on binders or VDRA.  Nutrition - Currently NPO  GOC - DNR. Palliative care consulted.  SI - Hx PTSD.  Psych consult ordered.     Lynnda Child PA-C [facility] 07/29/2022,11:42 AM   Seen and examined independently.  Agree with note and exam as documented above by physician extender and as noted here.  Still has a Actuary at bedside.  Palliative has met with his family  General frail chronically ill appearing elderly male in bed in no acute distress HEENT normocephalic atraumatic extraocular movements intact sclera anicteric Neck supple trachea midline Lungs clear to auscultation bilaterally normal work of breathing at rest on 2 liters oxygen  Heart S1S2 no rub Extremities no edema  Neuro drowsy; hard of hearing and speaks very softly; oriented to person and location of [facility] but not to year Access RIJ tunn catheter   ESRD - HD per TTS schedule   Anemia of CKD - on ESA 150 mcg every Sat   Small bowel obstruction - per primary team; minimal improvement on KUB   CAD - as  above.  Per cardiology. On medical therapy    HTN - acceptable   Palliative care consulted.  appreciate their assistance.  he is struggling   Claudia Desanctis, MD 07/29/2022 5:04 PM

## 2022-07-29 NOTE — Progress Notes (Signed)
Advanced Care Planning  Advance Care Planning    Advanced Directives Documents (Living Will, Power of Attorney) currently in the EHR no advanced directives documents available .  Has the patient discussed their wishes with their family/healthcare power of attorney yes. How much does the family or healthcare power of attorney know about their wishes. Allen Cortez would not want to bedbound requiring 24 hour care and unable to get up and go fishing.   What does the patient/decision maker understand about their medical condition and the natural course of their disease. He has contrast induced kidney failure requiring dialysis, complicated by significant heart failure. He has had persistent weight loss and decline in function. He may be approaching end of life.  What is the patient/decision maker's biggest fear or concern for the future pain and suffering   What is the most important goal for this patient should their health condition worsen maintenance of function.  Current   Code Status: DNR  Current code status has been reviewed/updated.  Time spent:30 Minutes and 30 Additional Minutes

## 2022-07-29 NOTE — Progress Notes (Signed)
Overnight progress note  Notified by RN that patient was sitting at the edge of the bed and nurse tech was helping him get back in bed.  Patient then started sliding off the bed but nurse tech was there with him and assisted him to the ground.  Per RN, patient did not have an actual fall and did not sustain any injuries. No change in his mental status and patient is not complaining of any pain.  -Continue to monitor closely -Fall precautions -Continue physical therapy

## 2022-07-29 NOTE — Progress Notes (Signed)
Progress Note  7 Days Post-Op  Subjective: Very drowsy this morning and mumbled speech. He is able to tell me he still has some abdominal pain. Discussed with RN and he has complained of some nausea but has not vomited. He has had bowel movements  Safety sitter bedside  Objective: Vital signs in last 24 hours: Temp:  [97.6 F (36.4 C)-98.6 F (37 C)] 97.8 F (36.6 C) (10/27 0759) Pulse Rate:  [56-118] 97 (10/27 0759) Resp:  [13-22] 18 (10/27 0759) BP: (90-122)/(56-80) 106/69 (10/27 0759) SpO2:  [88 %-100 %] 100 % (10/27 0759) Weight:  [59.4 kg-61 kg] 59.4 kg (10/26 1722) Last BM Date : 07/27/22  Intake/Output from previous day: 10/26 0701 - 10/27 0700 In: -  Out: 700  Intake/Output this shift: No intake/output data recorded.  PE: General: WD, male who is laying in bed in NAD Lungs: respiratory effort nonlabored Abd: soft, mild distension - improved from yesterday. Diffusely TTP without rebound or guarding MSK: all 4 extremities are symmetrical with no cyanosis, clubbing, or edema. Skin: warm and dry Psych: A&Ox3 with an appropriate affect.    Lab Results:  Recent Labs    07/28/22 0305 07/29/22 0346  WBC 14.1* 15.5*  HGB 9.3* 8.1*  HCT 27.4* 24.7*  PLT 249 204    BMET Recent Labs    07/28/22 0642 07/29/22 0346  NA 137 135  K 5.4* 4.5  CL 91* 95*  CO2 22 24  GLUCOSE 131* 96  BUN 92* 46*  CREATININE 6.73* 4.10*  CALCIUM 6.8* 7.2*    PT/INR Recent Labs    07/28/22 0305 07/29/22 0346  LABPROT 17.1* 16.1*  INR 1.4* 1.3*    CMP     Component Value Date/Time   NA 135 07/29/2022 0346   K 4.5 07/29/2022 0346   CL 95 (L) 07/29/2022 0346   CO2 24 07/29/2022 0346   GLUCOSE 96 07/29/2022 0346   BUN 46 (H) 07/29/2022 0346   CREATININE 4.10 (H) 07/29/2022 0346   CALCIUM 7.2 (L) 07/29/2022 0346   PROT 5.6 (L) 07/27/2022 0456   ALBUMIN 1.9 (L) 07/27/2022 0456   AST 24 07/27/2022 0456   ALT 14 07/27/2022 0456   ALKPHOS 66 07/27/2022 0456    BILITOT 1.3 (H) 07/27/2022 0456   GFRNONAA 15 (L) 07/29/2022 0346   GFRAA 76 (L) 04/09/2014 1537   Lipase  No results found for: "LIPASE"     Studies/Results: DG Abd Portable 1V-Small Bowel Obstruction Protocol-24 hr delay  Result Date: 07/28/2022 CLINICAL DATA:  Small-bowel obstruction. EXAM: PORTABLE ABDOMEN - 1 VIEW COMPARISON:  Abdominal radiographs dated 07/27/2022 and CT abdomen and pelvis dated 07/27/2022 FINDINGS: Enteric contrast appears to reach the hepatic flexure, suggestive of a partial small-bowel obstruction or resolving small bowel obstruction, however multiple dilated loops of small bowel are redemonstrated. IMPRESSION: Enteric contrast appears to reach the hepatic flexure, suggestive of a partial small-bowel obstruction or resolving small bowel obstruction given that multiple loops of small bowel remain dilated. Electronically Signed   By: Zerita Boers M.D.   On: 07/28/2022 13:52   DG Abd Portable 1V-Small Bowel Obstruction Protocol-initial, 8 hr delay  Result Date: 07/27/2022 CLINICAL DATA:  8 hour small-bowel follow-up film EXAM: PORTABLE ABDOMEN - 1 VIEW COMPARISON:  CT from earlier in the same day. FINDINGS: Scattered large and small bowel gas is noted. Contrast material administered previously now lies predominately within the small bowel which shows dilatation similar to that seen on the prior CT. No contrast is  noted within the colon. IMPRESSION: Contrast administered lies within dilated small bowel loops. No colonic contrast is seen. 24 hour follow-up film is recommended. Electronically Signed   By: [name] M.D.   On: 07/27/2022 21:01    Anti-infectives: Anti-infectives (From admission, onward)    None        Assessment/Plan Upper GI bleed Coffee ground emesis 10/20. GI consulted and did not recommend EGD given acute problems and need for plavix and anticoagulation. Hemoglobin stable s/p 3 units prbc total. GI signed off when no further signs of acute  bleeding SBO - CT scan 10/25 showing small bowel obstruction with transition to decreased caliber distal small bowel in the right lower quadrant of the abdomen.  - Likely adhesive obstruction in the setting of prior abdominal surgeries.  - SBO protocol with contrast PO completed. 8  hour film with contrast in SB. 24 hour film with contrast in colon and having Bms. Am xray still with SB dilatation but having bowel function - start CLD cognitive status permitting and advance as tolerated - We will sign off but please do not hesitate to contact us with any questions or concerns or reconsult if needed.  FEN: NPO, IVF per primary ID: none VTE: plavix, ASA, warfarin baseline (today INR 1.3)   ESRD on HD CAD  T2DM GERD PAD CHF HLD LV thrombus on warfarin  I reviewed Consultant cardiology notes, last 24 h vitals and pain scores, last 48 h intake and output, last 24 h labs and trends, and last 24 h imaging results.  .    LOS: 11 days   [facility] 07/29/2022, 8:07 AM Please see Amion for pager number during day hours 7:00am-4:30pm

## 2022-07-29 NOTE — Progress Notes (Signed)
PROGRESS NOTE    Allen Cortez  GEX:[id number] DOB: 03-11-1951 DOA: 07/18/2022 PCP: Gerome Sam, MD  71 yo male with the past medical history of CAD, systolic heart failure, LV thrombus, right renal cancer sp partial left nephrectomy, T2DM, ESRD and chronic hypoxemic respiratory failure who presented with dyspnea. At home he had progressive dyspnea and orthopnea despite hemodialysis.  Wife reports ongoing decline since starting dialysis in August. -In the ED he was noted to be volume overloaded, chest x-ray noted bilateral interstitial edema, pleural effusions 10/17 Patient had urgent HD with ultrafiltration  10/19 HD. Plan for multidisciplinary cardiovascular meeting  to decide if patient will benefit more from PCI than CABG.  10/20 PCI, followed by Coffee ground emesis and abdominal pain post procedure. 1 unit PRBC transfusion.  10/21 GI consulted with recommendations of IV pantoprazole, high risk for endoscopic procedure.  10/22 PRBC transfusion due to low hgb below 8.  10/23 no further active bleeding, resumed anticoagulation.  10/24 tolerated HD, GI signed off 10/24-25: Increasing abdominal pain, vomiting and distention, CT overnight concerning for SBO 10/25: General surgery consulted, high risk, not surgical candidate and high risk for NG tube with upper GI bleed and anticoagulation with DAPT   Subjective: -Confused overnight, sitter at bedside  Assessment and Plan:  Acute on chronic systolic CHF -Echo with drop in EF to 30-35%, akinesis of LV apical segment wall, inferior wall, antral anterior wall and lateral wall, severe hypokinesis of left ventricle basal mid anterior wall, preserved RV systolic function -Volume managed with HD, dialyzed yesterday -Continue carvedilol, hydralazine and Imdur  LV thrombus -Restarted on warfarin, INR was supratherapeutic at 4.5, yesterday, received vitamin K, now down to 1.4, warfarin resumed yesterday, INR remains  subtherapeutic -Extremely complicated scenario with ongoing bleeding, LV thrombus and high risk of thromboembolic event  Upper GI bleed Acute blood loss anemia -Prior reports of coffee-ground emesis and melena -Hemoglobin chronically low, worsened this admission -Transfused 3 units of PRBC this admission -Treated with IV PPI, gastroenterology was consulted, felt to be high risk for endoscopic evaluation -Unfortunately needs dual antiplatelet therapy in the setting of new stent and warfarin for LV thrombus, high risk of further decompensation, downtrend in hemoglobin noted  Abdominal pain SBO -10/24 evening developed increasing abdominal pain with vomiting, CT concerning for small bowel obstruction with transition in the right lower quadrant, history of prior abdominal surgeries, could be secondary to ideations -High risk scenario, held off on NG tube with recent upper GI bleed,  -Appreciate general surgery evaluation, options are limited, repeat KUB with contrast in the colon, surgery has signed off -Start clears, advance diet as tolerated -Discussed with wife by palliative care involvement given multisystem decompensations at this time  CAD with angina Elevated troponin Underwent left heart cath, proximal LAD to mid LAD was 100% stenosed, distal circumflex with 80% stenosis, successful PCI, stenting with DES to mid circumflex, recommendations to continue warfarin, aspirin for 1 month and Plavix for 6 months -Continue aspirin/Plavix/carvedilol statin -Then complicated by GI bleeding and followed by SBO  Acute kidney injury with acute tubular necrosis (ATN) (HCC) ESRD on HD. -Started hemodialysis in August -Admitted with volume overload, volume managed with HD, UF limited by cramping  Acute on chronic hypoxemic respiratory failure due to acute pulmonary edema -Wean O2 as tolerated  Type 2 diabetes mellitus with hyperlipidemia (HCC) -CBGs are stable, hold insulin therapy, oral intake  is poor  GERD (gastroesophageal reflux disease) Continue PPI  PAD (peripheral artery disease) ([place]) Continue with  statin therapy.   Severe protein calorie malnutrition -Albumin is 1.9, no history of liver disease, supplements as tolerated  Depression -Mention feeling depressed to nurse yesterday and then when asked further questions he said yes to suicidal thoughts -Now sitter at bedside, psych consulted  Goals of care: Extensive list of medical problems in 70/M who started dialysis in August and has been subsequently declining, now admitted with worsening cardiomyopathy, LV thrombus, CAD requiring PCI and stenting on dual antiplatelet therapy and warfarin complicated by GI bleed, anemia and now SBO -Prognosis appears poor, severely malnourished his albumin is 1.9, discussed prognosis with wife yesterday -Palliative consulted for goals of care, for goals of care meeting today  DVT prophylaxis: SCDs Code Status: DNR Family Communication: No family at bedside, will update wife Disposition Plan:   Consultants: Nephrology, cardiology, palliative care, general surgery   Procedures: Cardiac MRI on 07/15/22 IMPRESSION: 1. Subendocardial LGE consistent with prior infarct in mid anterior/anteroseptal, apical anterior/septal/inferior/lateral walls, and apex. LGE is greater than 50% transmural suggesting nonviability in apical anterior/septal/inferior/lateral walls and apex. LGE <50% transmural in mid anterior wall but wall thickness <4.65m suggests nonviability. Findings are consistent with nonviability in mid to distal LAD territory 2. Severe LV dilatation with severe systolic dysfunction (EF 271%. Thinning/akinesis of mid to apical anterior/anteroseptal walls and apical inferior/lateral walls. Aneurysm of apex 3.  LV apical thrombus measures 1259mx 59m64m.  Normal RV size and systolic function (EF 48%16%. Diffuse pulmonary opacities and bilateral pleural effusions (moderate on right,  small on left), consider CT chest for further Evaluation.   Echo in 06/2022  1. 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  Left ventricular ejection fraction, by estimation, is 30-35%. The left  ventricle is abnormal. The left  ventricle has focal regional wall motion abnormalities. The left  ventricular internal cavity size was mildly dilated. There is akinesis of  the left ventricular, apical septal wall, inferior wall, anterior wall and  lateral wall. There is akinesis of the  left ventricular, entire apical segment. There is severe hypokinesis of  the left ventricular, basal-mid anterior wall. A falst tendon is present  in the mid LV cavity.   Cath: 07/22/22     Prox LAD to Mid LAD lesion is 100% stenosed.   Dist Cx lesion is 80% stenosed.   A drug-eluting stent was successfully placed using a SYNERGY XD 3.0X20.   Post intervention, there is a 0% residual stenosis.   Successful PCI of the mid LCx with DES x 1     Plan: DAPT with ASA for one month, plavix for 12 months. OK to resume Coumadin tomorrow if no bleeding complications.     Antimicrobials:    Objective: Vitals:   07/29/22 0759 07/29/22 0800 07/29/22 1122 07/29/22 1144  BP: 106/69 106/69 110/73 110/73  Pulse: 97 95 (!) 153 (!) 105  Resp: 18   18  Temp: 97.8 F (36.6 C)   97.8 F (36.6 C)  TempSrc: Axillary   Axillary  SpO2: 100% 97% 99% 97%  Weight:      Height:        Intake/Output Summary (Last 24 hours) at 07/29/2022 1207 Last data filed at 07/28/2022 1700 Gross per 24 hour  Intake --  Output 700 ml  Net -700 ml    Filed Weights   07/26/22 1214 07/28/22 1329 07/28/22 1722  Weight: 62.2 kg 61  kg 59.4 kg    Examination:  General exam: Frail chronically ill male appears older than stated age, oriented to self only, more confused today HEENT: No JVD CVS: S1-S2, regular rhythm Lungs: Decreased  breath sounds at the bases Abdomen: Firm, less distended, mild tenderness, decreased bowel sounds Extremities: Some duskiness noted on left foot toes, no edema Skin: No rashes Psychiatry: Used    Data Reviewed:   CBC: Recent Labs  Lab 07/25/22 0213 07/26/22 0244 07/27/22 0456 07/28/22 0305 07/29/22 0346  WBC 8.3 5.7 9.9 14.1* 15.5*  HGB 7.9* 8.2* 8.7* 9.3* 8.1*  HCT 23.9* 25.0* 26.2* 27.4* 24.7*  MCV 93.4 92.9 92.3 89.8 91.1  PLT 144* 196 213 249 350   Basic Metabolic Panel: Recent Labs  Lab 07/23/22 0223 07/26/22 0244 07/27/22 0456 07/28/22 0642 07/29/22 0346  NA 137 132* 136 137 135  K 4.7 5.5* 4.9 5.4* 4.5  CL 91* 91* 93* 91* 95*  CO2 '27 23 23 22 24  '$ GLUCOSE 125* 107* 112* 131* 96  BUN 40* 78* 52* 92* 46*  CREATININE 5.53* 7.20* 5.11* 6.73* 4.10*  CALCIUM 8.7* 8.1* 7.8* 6.8* 7.2*  PHOS  --  6.0*  --   --   --    GFR: Estimated Creatinine Clearance: 13.7 mL/min (A) (by C-G formula based on SCr of 4.1 mg/dL (H)). Liver Function Tests: Recent Labs  Lab 07/26/22 0244 07/27/22 0456  AST  --  24  ALT  --  14  ALKPHOS  --  66  BILITOT  --  1.3*  PROT  --  5.6*  ALBUMIN 2.1* 1.9*   No results for input(s): "LIPASE", "AMYLASE" in the last 168 hours. No results for input(s): "AMMONIA" in the last 168 hours. Coagulation Profile: Recent Labs  Lab 07/25/22 0729 07/26/22 0244 07/27/22 0456 07/28/22 0305 07/29/22 0346  INR 2.7* 3.4* 4.7* 1.4* 1.3*   Cardiac Enzymes: No results for input(s): "CKTOTAL", "CKMB", "CKMBINDEX", "TROPONINI" in the last 168 hours. BNP (last 3 results) No results for input(s): "PROBNP" in the last 8760 hours. HbA1C: No results for input(s): "HGBA1C" in the last 72 hours. CBG: Recent Labs  Lab 07/28/22 1208 07/28/22 1738 07/28/22 2207 07/29/22 0809 07/29/22 1129  GLUCAP 134* 94 91 109* 97   Lipid Profile: No results for input(s): "CHOL", "HDL", "LDLCALC", "TRIG", "CHOLHDL", "LDLDIRECT" in the last 72 hours. Thyroid  Function Tests: No results for input(s): "TSH", "T4TOTAL", "FREET4", "T3FREE", "THYROIDAB" in the last 72 hours. Anemia Panel: No results for input(s): "VITAMINB12", "FOLATE", "FERRITIN", "TIBC", "IRON", "RETICCTPCT" in the last 72 hours. Urine analysis:    Component Value Date/Time   COLORURINE YELLOW 06/12/2022 1720   APPEARANCEUR CLEAR 06/12/2022 1720   LABSPEC 1.010 06/12/2022 1720   PHURINE 5.0 06/12/2022 1720   GLUCOSEU NEGATIVE 06/12/2022 1720   HGBUR SMALL (A) 06/12/2022 1720   BILIRUBINUR NEGATIVE 06/12/2022 1720   KETONESUR NEGATIVE 06/12/2022 1720   PROTEINUR 30 (A) 06/12/2022 1720   UROBILINOGEN 0.2 12/23/2009 0857   NITRITE NEGATIVE 06/12/2022 1720   LEUKOCYTESUR NEGATIVE 06/12/2022 1720   Sepsis Labs: '@LABRCNTIP'$ (procalcitonin:4,lacticidven:4)  ) No results found for this or any previous visit (from the past 240 hour(s)).    Radiology Studies: DG Abd Portable 1V  Result Date: 07/29/2022 CLINICAL DATA:  Small-bowel obstruction in a 71 year old male. EXAM: PORTABLE ABDOMEN - 1 VIEW COMPARISON:  July 28, 2022. FINDINGS: Central venous access device incidentally noted in the lower chest terminating in the RIGHT atrium. Visualized lung bases are clear. Persistent moderate to  marked small bowel distension throughout the abdomen. Dilution or further passage of contrast media into the colon. Question slightly decreased dilation of small bowel loops in the RIGHT lower quadrant. Signs of some gas and stool in the colon with similar distribution. Rectum not visualized. On limited assessment no acute skeletal findings. IMPRESSION: Signs of persistent presumed partial small bowel obstruction. Perhaps mildly diminished dilation of small bowel loops in the RIGHT lower quadrant, bowel loops in the LEFT hemiabdomen and central abdomen shows stable moderate to marked dilation. Electronically Signed   By: Zetta Bills M.D.   On: 07/29/2022 08:26   DG Abd Portable 1V-Small Bowel  Obstruction Protocol-24 hr delay  Result Date: 07/28/2022 CLINICAL DATA:  Small-bowel obstruction. EXAM: PORTABLE ABDOMEN - 1 VIEW COMPARISON:  Abdominal radiographs dated 07/27/2022 and CT abdomen and pelvis dated 07/27/2022 FINDINGS: Enteric contrast appears to reach the hepatic flexure, suggestive of a partial small-bowel obstruction or resolving small bowel obstruction, however multiple dilated loops of small bowel are redemonstrated. IMPRESSION: Enteric contrast appears to reach the hepatic flexure, suggestive of a partial small-bowel obstruction or resolving small bowel obstruction given that multiple loops of small bowel remain dilated. Electronically Signed   By: Zerita Boers M.D.   On: 07/28/2022 13:52   DG Abd Portable 1V-Small Bowel Obstruction Protocol-initial, 8 hr delay  Result Date: 07/27/2022 CLINICAL DATA:  8 hour small-bowel follow-up film EXAM: PORTABLE ABDOMEN - 1 VIEW COMPARISON:  CT from earlier in the same day. FINDINGS: Scattered large and small bowel gas is noted. Contrast material administered previously now lies predominately within the small bowel which shows dilatation similar to that seen on the prior CT. No contrast is noted within the colon. IMPRESSION: Contrast administered lies within dilated small bowel loops. No colonic contrast is seen. 24 hour follow-up film is recommended. Electronically Signed   By: [name] M.D.   On: 07/27/2022 21:01     Scheduled Meds:  atorvastatin  40 mg Oral QHS   bisacodyl  10 mg Oral Q1200   carvedilol  6.25 mg Oral BID WC   Chlorhexidine Gluconate Cloth  6 each Topical Q0600   citalopram  20 mg Oral QHS   clonazePAM  0.5 mg Oral QHS   clopidogrel  75 mg Oral Q breakfast   [START ON 07/30/2022] darbepoetin (ARANESP) injection - DIALYSIS  150 mcg Subcutaneous Q Sat-1800   docusate sodium  100 mg Oral BID   feeding supplement (NEPRO CARB STEADY)  237 mL Oral TID BM   guaiFENesin  600 mg Oral BID   hydrALAZINE  25 mg Oral BID    insulin aspart  0-6 Units Subcutaneous TID AC & HS   isosorbide dinitrate  20 mg Oral BID   multivitamin  1 tablet Oral QHS   pantoprazole (PROTONIX) IV  40 mg Intravenous Q12H   polyethylene glycol  17 g Oral BID   QUEtiapine  300 mg Oral QHS   sodium chloride flush  3 mL Intravenous Q12H   Warfarin - Pharmacist Dosing Inpatient   Does not apply q1600   Continuous Infusions:  sodium chloride     promethazine (PHENERGAN) injection (IM or IVPB) Stopped (07/23/22 1026)     LOS: 11 days    Time spent: 69mn  PDomenic Polite MD [facility]   07/29/2022, 12:07 PM

## 2022-07-29 NOTE — Consult Note (Signed)
Consultation Note Date: 07/29/2022   Patient Name: Allen Cortez  DOB: 1951/04/21  MRN: [id number]  Age / Sex: 71 y.o., male  PCP: Gerome Sam, MD Referring Physician: Domenic Polite, MD  Reason for Consultation:  goals of care  HPI/Patient Profile: 71 y.o. male  with past medical history PTSD treated with seroquel, AKI on CKD3 d/t contrast from recent cardiac cath started on HD with hopes of restoring kidney function but has been HD dependent, HTN, combined systolic and diastolic heart failure with recent cardiac MRI indicating no viable myocardium, LV thrombus, renal cell carcinoma s/p partial nephrectomy in 2016, PAD, DM admitted on 07/18/2022 with increasing SOB. Chest xray indicated interstitial markings with small bilateral pleural effusions. Has had emergent diuresis and underwent PCI intervention with drug (Plavix) eluding stent placed. This lead to development of GI bleeding. Subsequently developed SBO and deemed not to be a surgical candidate and could not have NG tube placed. He has now developed delirium and altered mental status possibly related to withdrawal from Seroquel and clonidine which is compounded by his acute illnesses and hospitalization. Palliative medicine consulted for goals of care.    Primary Decision Maker NEXT OF KIN - spouse - Kiptyn Rafuse  Discussion: I have reviewed medical records including Care Everywhere, progress notes from this and prior admissions, labs and imaging, discussed with RN.  On evaluation patient is somnolent. He is unable to Allen me his name, follow and directions, or answer any questions. He shakes his head "no" when asked if he's in pain.   I introduced Palliative Medicine as specialized medical care for people living with serious illness. It focuses on providing relief from the symptoms and stress of a serious illness. The goal is to improve  quality of life for both the patient and the family.  We discussed a brief life review of the patient. He worked as a Manufacturing systems engineer of all trades". Enjoyed being active. Enjoyed fishing off the pier at [employer].    As far as functional and nutritional status- he has had significant decline since starting dialysis. He has lost approximately 30 pounds in the last three months which is greater than 10 percent. He is sleeping more. His mobility has been limited by leg pain which had some improvement after he had a stent placed at the [place].    We discussed patient's current illness and what it means in the larger context of patient's on-going co-morbidities.  Natural disease trajectory and expectations at EOL were discussed.  I attempted to elicit values and goals of care important to the patient.    The difference between aggressive medical intervention and comfort care was considered in light of the patient's goals of care.   Advance directives, concepts specific to code status, artificial feeding and hydration, and rehospitalization were considered and discussed.  Discussed with patient/family the importance of continued conversation with family and the medical providers regarding overall plan of care and treatment options, ensuring decisions are within the context of the patient's values and GOCs.  [name] shares that patient would not want to be bedbound. Her main concern for now is restarting his psychiatric medications in effort to restore mental status in hopes that [place] discussion can be had with patient.   Questions and concerns were addressed. The family was encouraged to call with questions or concerns.   SUMMARY OF RECOMMENDATIONS -Continue current interventions -GOC are to attempt to reverse acute delirium state- discussed risk of seroquel in setting of QT prolongation and [name] accepts risk and I agree that benefit outweighs risk in his current medical state- could check magnesium and  supplement if he resumes taking po medications -Plan to meet again with [name] and their son on Monday in hopes that mental status has improved and further discussion can be held with [name]   Code Status/Advance Care Planning: DNR   Prognosis:   Unable to determine  Discharge Planning: To Be Determined  Primary Diagnoses: Present on Admission:  Type 2 diabetes mellitus with hyperlipidemia (HCC)  GERD (gastroesophageal reflux disease)  Coronary artery disease involving native coronary artery of native heart with angina pectoris ([place]): 100% mLAD, 80% m(AVG)LCx  Acute on chronic systolic congestive heart failure (HCC)  Myocardial infarction due to demand ischemia ([place])  Ischemic cardiomyopathy: EF 24% by C MRI  Neuromuscular disorder (HCC)  MDD (major depressive disorder), recurrent episode, severe ([place])  Acute kidney injury (AKI) with acute tubular necrosis (ATN) (HCC)  PAD (peripheral artery disease) (HCC)  Tobacco use disorder  Sleep apnea  Restless leg syndrome  Adjustment disorder with depressed mood  PTSD (post-traumatic stress disorder)   Review of Systems  Unable to perform ROS: Mental status change    Physical Exam Vitals and nursing note reviewed.  Constitutional:      Comments: frail  Neurological:     Mental Status: He is disoriented.     Comments: somnolent     Vital Signs: BP 110/73 (BP Location: Right Arm)   Pulse (!) 105   Temp 97.8 F (36.6 C) (Axillary)   Resp 18   Ht '5\' 10"'$  (1.778 m)   Wt 59.4 kg   SpO2 97%   BMI 18.79 kg/m  Pain Scale: 0-10 POSS *See Group Information*: S-Acceptable,Sleep, easy to arouse Pain Score: 0-No pain   SpO2: SpO2: 97 % O2 Device:SpO2: 97 % O2 Flow Rate: .O2 Flow Rate (L/min): 3 L/min  IO: Intake/output summary:  Intake/Output Summary (Last 24 hours) at 07/29/2022 1242 Last data filed at 07/28/2022 1700 Gross per 24 hour  Intake --  Output 700 ml  Net -700 ml    LBM: Last BM Date : 07/27/22 Baseline  Weight: Weight: 68.6 kg Most recent weight: Weight: 59.4 kg       Thank you for this consult. Palliative medicine will continue to follow and assist as needed.  Time Total: 90 minutes Greater than 50%  of this time was spent counseling and coordinating care related to the above assessment and plan.  Signed by: Mariana Kaufman, AGNP-C Palliative Medicine    Please contact Palliative Medicine Team phone at [phone] for questions and concerns.  For individual provider: See Shea Evans

## 2022-07-29 NOTE — Progress Notes (Signed)
Attempted to give pt Celexa and Clonopin with applesauce. Pt spit applesauce out onto floor.

## 2022-07-29 NOTE — Progress Notes (Signed)
Physical Therapy Treatment Patient Details Name: Allen Cortez MRN: [id number] DOB: 1951/02/16 Today's Date: 07/29/2022   History of Present Illness Pt is 71 yo male admitted 07/18/22 with SOB and LE edema. Workup for significant volume overload secondary to CHF, ESRD. S/p urgent HD with ultrafiltration 10/16. CT  concerning for SBO 10/24-25. PMH includes ESRD (HD TTS), CHF, CAD, AAA repair, HTN, HLD, DM2, OSA, GERD, renal cell carcinoma with partial L nephrectomy, CKD3, abdominal wall mass, depression.    PT Comments    The pt was agreeable to session after encouragement, but continues to have limited progress with mobility due to fatigue and deficits in strength and endurance at this time. He required increased assist to complete bed mobility and was again limited to bed-BSC transfers due to ongoing bowel movement and then pt fatigue after standing for ~30 seconds for cleaning. Will continue to benefit from maximal mobility and OOB attempts to progress functional strength in BLE as well as activity tolerance to return to prior level of mobility and independence.     Recommendations for follow up therapy are one component of a multi-disciplinary discharge planning process, led by the attending physician.  Recommendations may be updated based on patient status, additional functional criteria and insurance authorization.  Follow Up Recommendations  Home health PT     Assistance Recommended at Discharge Frequent or constant Supervision/Assistance  Patient can return home with the following Help with stairs or ramp for entrance;Assistance with cooking/housework;A little help with walking and/or transfers;A little help with bathing/dressing/bathroom;Direct supervision/assist for financial management;Direct supervision/assist for medications management;Assist for transportation   Equipment Recommendations  Rollator (4 wheels);BSC/3in1    Recommendations for Other Services        Precautions / Restrictions Precautions Precautions: Fall;Other (comment) Precaution Comments: fatigue with LE's; watch SpO2, soft BM Restrictions Weight Bearing Restrictions: No     Mobility  Bed Mobility Overal bed mobility: Needs Assistance Bed Mobility: Supine to Sit, Sit to Supine     Supine to sit: Min assist, HOB elevated, Mod assist Sit to supine: Min guard, HOB elevated   General bed mobility comments: minA to bring LE to EOB and modA to elevate trunk, pt able to return to bed with minA through LE and max cues    Transfers Overall transfer level: Needs assistance Equipment used: Rollator (4 wheels) Transfers: Sit to/from Stand, Bed to chair/wheelchair/BSC Sit to Stand: Min assist   Step pivot transfers: Mod assist       General transfer comment: MinA to power up to stand and steady from EOB 1x and from commode 1x, needing modA for stability and prevent LOB with stand step bed <> commode with noted knee buckling. pt needing assist with rollator movement as well, sitting impulsively    Ambulation/Gait Ambulation/Gait assistance: Min assist   Assistive device: Rollator (4 wheels) Gait Pattern/deviations: Step-through pattern, Decreased stride length, Trunk flexed, Knees buckling Gait velocity: decreased     General Gait Details: small unsteady steps from bed-BSC, declines all further mobility due to fatigue after standing for cleaning      Balance Overall balance assessment: Needs assistance Sitting-balance support: Feet supported Sitting balance-Leahy Scale: Fair Sitting balance - Comments: Min guard for static sitting balance   Standing balance support: Bilateral upper extremity supported, During functional activity Standing balance-Leahy Scale: Poor Standing balance comment: Reliant on UE support and up to modA with noted knee buckling  Cognition Arousal/Alertness: Lethargic Behavior During Therapy: Flat  affect Overall Cognitive Status: Impaired/Different from baseline Area of Impairment: Safety/judgement, Awareness, Problem solving, Following commands, Attention                   Current Attention Level: Selective   Following Commands: Follows one step commands consistently, Follows one step commands with increased time, Follows multi-step commands inconsistently Safety/Judgement: Decreased awareness of safety Awareness: Intellectual Problem Solving: Slow processing, Requires verbal cues, Requires tactile cues, Difficulty sequencing General Comments: pt lethargic but arousable upon my arrival, difficult to understand due to mumbled/garbled speech. soiled of BM but did not alert staff, poor awareness and insight to ask fo assistance or voice needs.        Exercises      General Comments General comments (skin integrity, edema, etc.): BP soft after mobility 90s/60s      Pertinent Vitals/Pain Pain Assessment Pain Assessment: Faces Pain Score: 5  Faces Pain Scale: Hurts a little bit Pain Location: generalized with mobility Pain Descriptors / Indicators: Grimacing Pain Intervention(s): Monitored during session     PT Goals (current goals can now be found in the care plan section) Acute Rehab PT Goals Patient Stated Goal: to sleep PT Goal Formulation: With patient Time For Goal Achievement: 08/02/22 Potential to Achieve Goals: Good Progress towards PT goals: Progressing toward goals    Frequency    Min 3X/week      PT Plan Current plan remains appropriate       AM-PAC PT "6 Clicks" Mobility   Outcome Measure  Help needed turning from your back to your side while in a flat bed without using bedrails?: None Help needed moving from lying on your back to sitting on the side of a flat bed without using bedrails?: A Little Help needed moving to and from a bed to a chair (including a wheelchair)?: A Lot Help needed standing up from a chair using your arms (e.g.,  wheelchair or bedside chair)?: A Little Help needed to walk in hospital room?: A Lot Help needed climbing 3-5 steps with a railing? : A Lot 6 Click Score: 16    End of Session Equipment Utilized During Treatment: Oxygen Activity Tolerance: Patient limited by fatigue Patient left: in bed;with call bell/phone within reach;with bed alarm set;with nursing/sitter in room Nurse Communication: Mobility status PT Visit Diagnosis: Pain;Difficulty in walking, not elsewhere classified (R26.2);Unsteadiness on feet (R26.81);Other abnormalities of gait and mobility (R26.89) Pain - Right/Left:  (both) Pain - part of body:  (Bil thighs)     Time: 1950-9326 PT Time Calculation (min) (ACUTE ONLY): 28 min  Charges:  $Therapeutic Exercise: 8-22 mins $Therapeutic Activity: 8-22 mins                     West Carbo, PT, DPT   Acute Rehabilitation Department   Sandra Cockayne 07/29/2022, 4:31 PM

## 2022-07-30 ENCOUNTER — Inpatient Hospital Stay (HOSPITAL_COMMUNITY): Payer: No Typology Code available for payment source

## 2022-07-30 DIAGNOSIS — I5023 Acute on chronic systolic (congestive) heart failure: Secondary | ICD-10-CM | POA: Diagnosis not present

## 2022-07-30 DIAGNOSIS — I25119 Atherosclerotic heart disease of native coronary artery with unspecified angina pectoris: Secondary | ICD-10-CM | POA: Diagnosis not present

## 2022-07-30 DIAGNOSIS — R7989 Other specified abnormal findings of blood chemistry: Secondary | ICD-10-CM | POA: Diagnosis not present

## 2022-07-30 DIAGNOSIS — E1169 Type 2 diabetes mellitus with other specified complication: Secondary | ICD-10-CM | POA: Diagnosis not present

## 2022-07-30 DIAGNOSIS — N170 Acute kidney failure with tubular necrosis: Secondary | ICD-10-CM | POA: Diagnosis not present

## 2022-07-30 LAB — CBC
HCT: 22.4 % — ABNORMAL LOW (ref 39.0–52.0)
HCT: 23.7 % — ABNORMAL LOW (ref 39.0–52.0)
Hemoglobin: 7.8 g/dL — ABNORMAL LOW (ref 13.0–17.0)
Hemoglobin: 8 g/dL — ABNORMAL LOW (ref 13.0–17.0)
MCH: 31.1 pg (ref 26.0–34.0)
MCH: 30.7 pg (ref 26.0–34.0)
MCHC: 34.8 g/dL (ref 30.0–36.0)
MCHC: 33.8 g/dL (ref 30.0–36.0)
MCV: 89.2 fL (ref 80.0–100.0)
MCV: 90.8 fL (ref 80.0–100.0)
Platelets: 172 10*3/uL (ref 150–400)
Platelets: 172 10*3/uL (ref 150–400)
RBC: 2.51 MIL/uL — ABNORMAL LOW (ref 4.22–5.81)
RBC: 2.61 MIL/uL — ABNORMAL LOW (ref 4.22–5.81)
RDW: 20.2 % — ABNORMAL HIGH (ref 11.5–15.5)
RDW: 20.7 % — ABNORMAL HIGH (ref 11.5–15.5)
WBC: 12.3 10*3/uL — ABNORMAL HIGH (ref 4.0–10.5)
WBC: 11.3 10*3/uL — ABNORMAL HIGH (ref 4.0–10.5)
nRBC: 0.3 % — ABNORMAL HIGH (ref 0.0–0.2)
nRBC: 0.4 % — ABNORMAL HIGH (ref 0.0–0.2)

## 2022-07-30 LAB — RENAL FUNCTION PANEL
Albumin: 1.8 g/dL — ABNORMAL LOW (ref 3.5–5.0)
Anion gap: 19 — ABNORMAL HIGH (ref 5–15)
BUN: 83 mg/dL — ABNORMAL HIGH (ref 8–23)
CO2: 21 mmol/L — ABNORMAL LOW (ref 22–32)
Calcium: 7.1 mg/dL — ABNORMAL LOW (ref 8.9–10.3)
Chloride: 95 mmol/L — ABNORMAL LOW (ref 98–111)
Creatinine, Ser: 5.85 mg/dL — ABNORMAL HIGH (ref 0.61–1.24)
GFR, Estimated: 10 mL/min — ABNORMAL LOW (ref >60)
Glucose, Bld: 98 mg/dL (ref 70–99)
Phosphorus: 8.2 mg/dL — ABNORMAL HIGH (ref 2.5–4.6)
Potassium: 5 mmol/L (ref 3.5–5.1)
Sodium: 135 mmol/L (ref 135–145)

## 2022-07-30 LAB — GLUCOSE, CAPILLARY
Glucose-Capillary: 100 mg/dL — ABNORMAL HIGH (ref 70–99)
Glucose-Capillary: 101 mg/dL — ABNORMAL HIGH (ref 70–99)
Glucose-Capillary: 83 mg/dL (ref 70–99)
Glucose-Capillary: 84 mg/dL (ref 70–99)

## 2022-07-30 LAB — PROTIME-INR
INR: 1.4 — ABNORMAL HIGH (ref 0.8–1.2)
Prothrombin Time: 16.9 seconds — ABNORMAL HIGH (ref 11.4–15.2)

## 2022-07-30 MED ORDER — WARFARIN SODIUM 2 MG PO TABS
3.0000 mg | ORAL_TABLET | Freq: Once | ORAL | Status: AC
  Administered 2022-07-30: 3 mg via ORAL
  Filled 2022-07-30: qty 1

## 2022-07-30 MED ORDER — ANTICOAGULANT SODIUM CITRATE 4% (200MG/5ML) IV SOLN: 5.0000 mL | Status: DC | PRN

## 2022-07-30 MED ORDER — ALTEPLASE 2 MG IJ SOLR: 2.0000 mg | Freq: Once | INTRAMUSCULAR | Status: DC | PRN

## 2022-07-30 MED ORDER — HEPARIN SODIUM (PORCINE) 1000 UNIT/ML DIALYSIS
1000.0000 [IU] | INTRAMUSCULAR | Status: DC | PRN
  Administered 2022-07-30: 1000 [IU]
  Filled 2022-07-30 (×2): qty 1

## 2022-07-30 MED ORDER — IPRATROPIUM-ALBUTEROL 0.5-2.5 (3) MG/3ML IN SOLN
3.0000 mL | Freq: Four times a day (QID) | RESPIRATORY_TRACT | Status: DC
  Administered 2022-07-30: 3 mL via RESPIRATORY_TRACT
  Filled 2022-07-30: qty 3

## 2022-07-30 MED ORDER — IPRATROPIUM-ALBUTEROL 0.5-2.5 (3) MG/3ML IN SOLN: 3.0000 mL | Freq: Four times a day (QID) | RESPIRATORY_TRACT | Status: DC

## 2022-07-30 MED ORDER — SODIUM CHLORIDE 0.9 % IV SOLN
3.0000 g | INTRAVENOUS | Status: DC
  Administered 2022-07-30 – 2022-07-31 (×2): 3 g via INTRAVENOUS
  Filled 2022-07-30 (×2): qty 8

## 2022-07-30 MED ORDER — WARFARIN SODIUM 2 MG PO TABS
3.0000 mg | ORAL_TABLET | Freq: Once | ORAL | Status: AC
  Filled 2022-07-30: qty 1

## 2022-07-30 NOTE — Progress Notes (Signed)
   07/29/22 1955  What Happened  Was fall witnessed? Yes  Who witnessed fall? Deritra Mcnairy (CMA)  Patients activity before fall other (comment) (sat at the edge of the bed)  Point of contact buttocks  Was patient injured? No  Patient found on floor (with CMA helping him to get up)  Found by Staff-comment Gardiner Coins, CMA)  Stated prior activity to/from bed, chair, or stretcher  Follow Up  MD notified Dr Ninetta Lights  Time MD notified 2030  Family notified Yes - comment  Time family notified 0130  Adult Fall Risk Assessment  Risk Factor Category (scoring not indicated) Not Applicable  Age 71  Fall History: Fall within 6 months prior to admission 0  Elimination; Bowel and/or Urine Incontinence 0  Elimination; Bowel and/or Urine Urgency/Frequency 0  Medications: includes PCA/Opiates, Anti-convulsants, Anti-hypertensives, Diuretics, Hypnotics, Laxatives, Sedatives, and Psychotropics 5  Patient Care Equipment 0  Mobility-Assistance 2  Mobility-Gait 2  Mobility-Sensory Deficit 0  Altered awareness of immediate physical environment 1  Impulsiveness 2  Lack of understanding of one's physical/cognitive limitations 4  Total Score 18  Patient Fall Risk Level High fall risk  Adult Fall Risk Interventions  Required Bundle Interventions *See Row Information* High fall risk - low, moderate, and high requirements implemented  Additional Interventions Use of appropriate toileting equipment (bedpan, BSC, etc.)  Screening for Fall Injury Risk (To be completed on HIGH fall risk patients) - Assessing Need for Floor Mats  Risk For Fall Injury- Criteria for Floor Mats Previous fall this admission  Will Implement Floor Mats Yes  Vitals  Temp 98.7 F (37.1 C)  Temp Source Oral  BP 106/78  MAP (mmHg) 87  BP Location Right Arm  BP Method Automatic  Patient Position (if appropriate) Lying  Pulse Rate (!) 102  Pulse Rate Source Monitor  ECG Heart Rate (!) 103  Oxygen Therapy  SpO2 94 %   O2 Device Nasal Cannula  O2 Flow Rate (L/min) 3 L/min  Pain Assessment  Pain Scale 0-10  Pain Score 0  Neurological  Neuro (WDL) X  Level of Consciousness Alert  Orientation Level Oriented to person;Disoriented to time;Disoriented to place;Disoriented to situation  Cognition Impulsive;Poor attention/concentration  Speech Clear  R Pupil Size (mm) 3  R Pupil Shape Round  R Pupil Reaction Brisk  L Pupil Size (mm) 3  L Pupil Shape Round  L Pupil Reaction Brisk  Motor Function/Sensation Assessment Grip  Facial Symmetry Symmetrical  R Hand Grip Strong  L Hand Grip Strong  Neuro Symptoms Forgetful  Glasgow Coma Scale  Eye Opening 4  Best Verbal Response (NON-intubated) 4  Best Motor Response 6  Glasgow Coma Scale Score 14  Musculoskeletal  Musculoskeletal (WDL) X  Assistive Device BSC  Integumentary  Integumentary (WDL) X  Skin Condition Dry  Skin Integrity Abrasion  Abrasion Location Arm  Abrasion Location Orientation Right  Skin Turgor Non-tenting

## 2022-07-30 NOTE — Plan of Care (Signed)
  Problem: Coping: Goal: Ability to adjust to condition or change in health will improve Outcome: Progressing   Problem: Health Behavior/Discharge Planning: Goal: Ability to manage health-related needs will improve Outcome: Progressing   Problem: Nutritional: Goal: Maintenance of adequate nutrition will improve Outcome: Progressing

## 2022-07-30 NOTE — Progress Notes (Addendum)
[facility] Progress Note   Subjective:   Seen in [facility] - on HD. UF goal 1L. Remains lethargic, wakes briefly.  Objective Vitals:   07/30/22 0822 07/30/22 0830 07/30/22 0900 07/30/22 0930  BP: 108/72 115/74 103/76 108/72  Pulse: 100 96 97 95  Resp: (!) 27 (!) 24 19 (!) 26  Temp: 98 F (36.7 C)     TempSrc:      SpO2: 100% 99% 96% 100%  Weight: 61.5 kg     Height:       Physical Exam General: chronically ill appearing male in NAD Heart: RRR, no mrg Lungs: +rhonchi, nml WOB on 3L O2 via [place] Abdomen: soft, non-tender  Extremities: no LE edema Dialysis Access: [facility]   Filed Weights   07/28/22 1329 07/28/22 1722 07/30/22 0822  Weight: 61 kg 59.4 kg 61.5 kg   No intake or output data in the 24 hours ending 07/30/22 1010   Additional Objective Labs: Basic Metabolic Panel: Recent Labs  Lab 07/26/22 0244 07/27/22 0456 07/28/22 0642 07/29/22 0346 07/30/22 0726  NA 132*   < > 137 135 135  K 5.5*   < > 5.4* 4.5 5.0  CL 91*   < > 91* 95* 95*  CO2 23   < > 22 24 21*  GLUCOSE 107*   < > 131* 96 98  BUN 78*   < > 92* 46* 83*  CREATININE 7.20*   < > 6.73* 4.10* 5.85*  CALCIUM 8.1*   < > 6.8* 7.2* 7.1*  PHOS 6.0*  --   --   --  8.2*   < > = values in this interval not displayed.    Liver Function Tests: Recent Labs  Lab 07/26/22 0244 07/27/22 0456 07/30/22 0726  AST  --  24  --   ALT  --  14  --   ALKPHOS  --  66  --   BILITOT  --  1.3*  --   PROT  --  5.6*  --   ALBUMIN 2.1* 1.9* 1.8*     CBC: Recent Labs  Lab 07/27/22 0456 07/28/22 0305 07/29/22 0346 07/30/22 0137 07/30/22 0726  WBC 9.9 14.1* 15.5* 12.3* 11.3*  HGB 8.7* 9.3* 8.1* 7.8* 8.0*  HCT 26.2* 27.4* 24.7* 22.4* 23.7*  MCV 92.3 89.8 91.1 89.2 90.8  PLT 213 249 204 172 172    CBG: Recent Labs  Lab 07/29/22 0809 07/29/22 1129 07/29/22 1601 07/30/22 0046 07/30/22 0659  GLUCAP 109* 97 119* 101* 100*    Studies/Results: DG Abd Portable 1V  Result Date: 07/29/2022 CLINICAL  DATA:  Small-bowel obstruction in a 71 year old male. EXAM: PORTABLE ABDOMEN - 1 VIEW COMPARISON:  July 28, 2022. FINDINGS: Central venous access device incidentally noted in the lower chest terminating in the RIGHT atrium. Visualized lung bases are clear. Persistent moderate to marked small bowel distension throughout the abdomen. Dilution or further passage of contrast media into the colon. Question slightly decreased dilation of small bowel loops in the RIGHT lower quadrant. Signs of some gas and stool in the colon with similar distribution. Rectum not visualized. On limited assessment no acute skeletal findings. IMPRESSION: Signs of persistent presumed partial small bowel obstruction. Perhaps mildly diminished dilation of small bowel loops in the RIGHT lower quadrant, bowel loops in the LEFT hemiabdomen and central abdomen shows stable moderate to marked dilation. Electronically Signed   By: Zetta Bills M.D.   On: 07/29/2022 08:26   DG Abd Portable 1V-Small Bowel Obstruction Protocol-24  hr delay  Result Date: 07/28/2022 CLINICAL DATA:  Small-bowel obstruction. EXAM: PORTABLE ABDOMEN - 1 VIEW COMPARISON:  Abdominal radiographs dated 07/27/2022 and CT abdomen and pelvis dated 07/27/2022 FINDINGS: Enteric contrast appears to reach the hepatic flexure, suggestive of a partial small-bowel obstruction or resolving small bowel obstruction, however multiple dilated loops of small bowel are redemonstrated. IMPRESSION: Enteric contrast appears to reach the hepatic flexure, suggestive of a partial small-bowel obstruction or resolving small bowel obstruction given that multiple loops of small bowel remain dilated. Electronically Signed   By: Zerita Boers M.D.   On: 07/28/2022 13:52    Medications:  sodium chloride     anticoagulant sodium citrate     promethazine (PHENERGAN) injection (IM or IVPB) Stopped (07/23/22 1026)    atorvastatin  40 mg Oral QHS   bisacodyl  10 mg Oral Q1200   carvedilol  6.25 mg  Oral BID WC   Chlorhexidine Gluconate Cloth  6 each Topical Q0600   citalopram  20 mg Oral QHS   clonazePAM  0.5 mg Oral QHS   clopidogrel  75 mg Oral Q breakfast   darbepoetin (ARANESP) injection - DIALYSIS  150 mcg Subcutaneous Q Sat-1800   docusate sodium  100 mg Oral BID   feeding supplement (NEPRO CARB STEADY)  237 mL Oral TID BM   guaiFENesin  600 mg Oral BID   hydrALAZINE  25 mg Oral BID   insulin aspart  0-6 Units Subcutaneous TID AC & HS   isosorbide dinitrate  20 mg Oral BID   multivitamin  1 tablet Oral QHS   pantoprazole (PROTONIX) IV  40 mg Intravenous Q12H   polyethylene glycol  17 g Oral BID   QUEtiapine  300 mg Oral QHS   sodium chloride flush  3 mL Intravenous Q12H   Warfarin - Pharmacist Dosing Inpatient   Does not apply q1600    Dialysis Orders: [place] TTS 4h 400/A1.5x EDW 70kg 3K/2.25Ca TDC Heparin 2000 U    Assessment/Plan: Acute hypoxic resp failure - likely related to volume overload. CXR w/CHF and b/l pleural effusions.  Improved, now under dry weight.  Repeat CXR 10/23 with persistent cardiomegaly and pulmonary edema. Probable layering pleural fluid.  UF limited by cramping, continue to titrate down as tolerated.  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 Continue HD TTS. HD today Hx severe leg pain with dialysis.  At outpt HD he actually takes Oxycodone prescribed by cardiology for the leg pain per family.  SBO - identified on CT 10/24.  Surgery consulted and patient is not good candidate for surgery or NG tube placement.  On bowel rest.  HTN - BP ok. Coreg 6.'25mg'$  BID, hydralazine '25mg'$  BID, and isordil '20mg'$  BID.  d/c home lasix d/t minimal to no UOP. Now well below OP dry weight. Minimal UF.  CAD -CTS consulted - no plans for surgery at this time, high risk.  s/p DES to circ on 10/20. Needs triple AC therapy - ASA, plavix and coumadin x 1 month, then just plavix, coumadin. Per GI ok with ASA, plavix therapy continued as long as no overt bleeding,  hold coumadin if ongoing drop in Hgb, ok to restart heparin if Hgb stable.  Per cardiology A/C systolic CHF - echo with EF 30-35%.  Meds limited by kidney function.  Volume removal with HD.  Anemia-  Hgb 8.1  s/p 3 units prbcs since admission. Tsat 19%, getting iron load.  ESA 150 qSat. Bones-  CorrCa ok.  Hyperphosphatemia --resume binders when eating.  Nutrition - On liquid diet  GOC - DNR. Palliative care consulted.  SI - Hx PTSD.  Psych consult ordered.     Lynnda Child PA-C [facility] 07/30/2022,10:10 AM   Seen and examined independently.  Agree with note and exam as documented above by physician extender and as noted here.  Seen and examined on dialysis.  Blood pressure 90/66 and HR 97.  He is out of UF after hypotension to the 50'P systolic.  His HD nurse asks if we can have a restraint order after his sitter leaves and I let her know that I was not comfortable with that.  He is sleeping now/lethargy but when more awake he is agitated and pulling at lines.  If he is agitated and if not able to be redirected we will just have to take him off the dialysis machine.  It has been more difficult to dialyze here recently in the inpatient setting and I am concerned he would not be a candidate for safe outpatient HD if his mental status does not improve.   Agree with goals of care discussion on Monday.  He is not doing well on HD  Claudia Desanctis, MD 07/30/2022  10:47 AM

## 2022-07-30 NOTE — Progress Notes (Addendum)
   07/30/22 1321  Vitals  Temp 98.4 F (36.9 C)  Temp Source Oral  BP 109/66  MAP (mmHg) 81  BP Location Right Arm  BP Method Automatic  Patient Position (if appropriate) Lying  Pulse Rate 92  Pulse Rate Source Monitor  ECG Heart Rate (!) 105  Resp (!) 21  Oxygen Therapy  SpO2 96 %  O2 Device Nasal Cannula  O2 Flow Rate (L/min) 3 L/min  Pulse Oximetry Type Continuous   Received patient in bed to unit.  Alert and oriented.  Informed consent signed and in chart.   Treatment initiated: 0830 Treatment completed: 1249  Patient did not tolerate tx well. Bp issues that required UF off for a bit and a NS bolus  Transported back to the room  Alert, without acute distress.  Hand-off given to patient's nurse.   Access used: HD cath Access issues: constant alarming high AP - 490 when pt coughs  Pt is very congested and "junky", coughs a lot. Non productive  Total UF removed: 900 ml Medication(s) given: Heparin Dwells 3200 units Post HD VS: see above Post HD weight: 60.3kg   [facility]

## 2022-07-30 NOTE — Progress Notes (Signed)
ANTICOAGULATION CONSULT NOTE  Pharmacy Consult for Coumadin Indication: LV thrombus  Allergies  Allergen Reactions   Dilaudid [Hydromorphone Hcl] Other (See Comments)    hallucinations   Mirapex [Pramipexole Dihydrochloride] Other (See Comments)    Leg pain   Prednisone Other (See Comments)    irritability   Gabapentin     Other reaction(s): Tremor    Patient Measurements: Height: '5\' 10"'$  (177.8 cm) Weight: 60.3 kg (132 lb 15 oz) IBW/kg (Calculated) : 73  Vital Signs: Temp: 98.4 F (36.9 C) (10/28 1321) Temp Source: Oral (10/28 1321) BP: 109/66 (10/28 1321) Pulse Rate: 92 (10/28 1321)  Labs: Recent Labs    07/28/22 0305 07/28/22 0642 07/29/22 0346 07/30/22 0137 07/30/22 0726  HGB 9.3*  --  8.1* 7.8* 8.0*  HCT 27.4*  --  24.7* 22.4* 23.7*  PLT 249  --  204 172 172  LABPROT 17.1*  --  16.1* 16.9*  --   INR 1.4*  --  1.3* 1.4*  --   CREATININE  --  6.73* 4.10*  --  5.85*     Estimated Creatinine Clearance: 9.7 mL/min (A) (by C-G formula based on SCr of 5.85 mg/dL (H)).   Medical History: Past Medical History:  Diagnosis Date   AAA (abdominal aortic aneurysm) (HCC)    Abdominal wall mass of left lower quadrant    Basal cell carcinoma    CAD (coronary artery disease) 2011   moderate   Colon polyp    DDD (degenerative disc disease)    Depression    Diabetes mellitus type II    no meds   GERD (gastroesophageal reflux disease)    Hyperlipidemia    Neuromuscular disorder (HCC)    Osteopenia    PAD (peripheral artery disease) (HCC)    Renal cell carcinoma    Restless leg syndrome    Sleep apnea    used a cpap yr ago-does not snore or use it now.   Assessment: 71 yo male on warfarin PTA for recent LV thrombus. He is also noted with ESRD and s/p emergent HD. S/p PCI 10/20, heparin held d/t 1 episode of coffee-ground emesis, no reports of bleeding since. Pharmacy consulted to bridge heparin to warfarin.   Heparin stopped due to elevated INR and concern for  GIB. Vit K given 10/25, INR is still subtherapeutic today at 1.4. Will be cautious with warfarin dosing given poor po intake lately and recent supratherapeutic INR. Hgb 8, plt wnl.    Goal of Therapy:  INR 2-3 Monitor platelets by anticoagulation protocol: Yes   Plan:  Give Warfarin '3mg'$  x1 Continue plavix Monitor daily INR and signs/symptoms of bleeding  [name], PharmD, [facility] PGY1 Pharmacy Resident 07/30/2022 2:03 PM

## 2022-07-30 NOTE — Progress Notes (Signed)
Pharmacy Antibiotic Note  Allen Cortez is a 71 y.o. male admitted on 07/18/2022 now with possible aspiration pneumonia.  ESRD, wbc elevated 11 with right lung opacity on CXR 10/28  .  Pharmacy has been consulted for Uasyn dosing.  Plan: Unasyn 3gm q24h - ensure give after HD on TTS- HD days   Height: '5\' 10"'$  (177.8 cm) Weight: 60.3 kg (132 lb 15 oz) IBW/kg (Calculated) : 73  Temp (24hrs), Avg:98.3 F (36.8 C), Min:98 F (36.7 C), Max:98.7 F (37.1 C)  Recent Labs  Lab 07/26/22 0244 07/27/22 0456 07/28/22 0305 07/28/22 0642 07/29/22 0346 07/30/22 0137 07/30/22 0726  WBC 5.7 9.9 14.1*  --  15.5* 12.3* 11.3*  CREATININE 7.20* 5.11*  --  6.73* 4.10*  --  5.85*    Estimated Creatinine Clearance: 9.7 mL/min (A) (by C-G formula based on SCr of 5.85 mg/dL (H)).    Allergies  Allergen Reactions   Dilaudid [Hydromorphone Hcl] Other (See Comments)    hallucinations   Mirapex [Pramipexole Dihydrochloride] Other (See Comments)    Leg pain   Prednisone Other (See Comments)    irritability   Gabapentin     Other reaction(s): Tremor    Antimicrobials this admission:  Dose adjustments this admission:   Microbiology results:   Bonnita Nasuti Pharm.D. CPP, BCPS Clinical Pharmacist [phone] 07/30/2022 3:27 PM

## 2022-07-30 NOTE — Progress Notes (Signed)
Rounding Note    Patient Name: Allen Cortez Date of Encounter: 07/30/2022  [place] Cardiologist: Shelva Majestic, MD   Subjective   In dialysis Sats fine rhythm and BP stable   Inpatient Medications    Scheduled Meds:  atorvastatin  40 mg Oral QHS   bisacodyl  10 mg Oral Q1200   carvedilol  6.25 mg Oral BID WC   Chlorhexidine Gluconate Cloth  6 each Topical Q0600   citalopram  20 mg Oral QHS   clonazePAM  0.5 mg Oral QHS   clopidogrel  75 mg Oral Q breakfast   darbepoetin (ARANESP) injection - DIALYSIS  150 mcg Subcutaneous Q Sat-1800   docusate sodium  100 mg Oral BID   feeding supplement (NEPRO CARB STEADY)  237 mL Oral TID BM   guaiFENesin  600 mg Oral BID   hydrALAZINE  25 mg Oral BID   insulin aspart  0-6 Units Subcutaneous TID AC & HS   isosorbide dinitrate  20 mg Oral BID   multivitamin  1 tablet Oral QHS   pantoprazole (PROTONIX) IV  40 mg Intravenous Q12H   polyethylene glycol  17 g Oral BID   QUEtiapine  300 mg Oral QHS   sodium chloride flush  3 mL Intravenous Q12H   Warfarin - Pharmacist Dosing Inpatient   Does not apply q1600   Continuous Infusions:  sodium chloride     anticoagulant sodium citrate     promethazine (PHENERGAN) injection (IM or IVPB) Stopped (07/23/22 1026)   PRN Meds: sodium chloride, acetaminophen **OR** acetaminophen, albuterol, alteplase, anticoagulant sodium citrate, heparin, HYDROmorphone (DILAUDID) injection, LORazepam, ondansetron (ZOFRAN) IV, mouth rinse, promethazine (PHENERGAN) injection (IM or IVPB), sodium chloride flush   Vital Signs    Vitals:   07/30/22 0822 07/30/22 0830 07/30/22 0900 07/30/22 0930  BP: 108/72 115/74 103/76 108/72  Pulse: 100 96 97 95  Resp: (!) 27 (!) 24 19 (!) 26  Temp: 98 F (36.7 C)     TempSrc:      SpO2: 100% 99% 96% 100%  Weight: 61.5 kg     Height:       No intake or output data in the 24 hours ending 07/30/22 1013     07/30/2022    8:22 AM 07/28/2022    5:22 PM  07/28/2022    1:29 PM  Last 3 Weights  Weight (lbs) 135 lb 9.3 oz 130 lb 15.3 oz 134 lb 7.7 oz  Weight (kg) 61.5 kg 59.4 kg 61 kg      Telemetry    Sinus rhythm with HR 80-90s - Personally Reviewed  ECG    No new tracings - Personally Reviewed  Physical Exam   In dialysis  Lethargic Right subclavian dialysis catheter  Rhonchi Abdomen soft  Dusky left 2/3 rd toes palpable pedal pulse   Labs    High Sensitivity Troponin:   Recent Labs  Lab 07/18/22 1653 07/18/22 1845 07/18/22 2045 07/19/22 0454  TROPONINIHS 266* 271* 256* 288*     Chemistry Recent Labs  Lab 07/26/22 0244 07/27/22 0456 07/28/22 0642 07/29/22 0346 07/30/22 0726  NA 132* 136 137 135 135  K 5.5* 4.9 5.4* 4.5 5.0  CL 91* 93* 91* 95* 95*  CO2 '23 23 22 24 '$ 21*  GLUCOSE 107* 112* 131* 96 98  BUN 78* 52* 92* 46* 83*  CREATININE 7.20* 5.11* 6.73* 4.10* 5.85*  CALCIUM 8.1* 7.8* 6.8* 7.2* 7.1*  PROT  --  5.6*  --   --   --  ALBUMIN 2.1* 1.9*  --   --  1.8*  AST  --  24  --   --   --   ALT  --  14  --   --   --   ALKPHOS  --  66  --   --   --   BILITOT  --  1.3*  --   --   --   GFRNONAA 7* 11* 8* 15* 10*  ANIONGAP 18* 20* 24* 16* 19*    Lipids No results for input(s): "CHOL", "TRIG", "HDL", "LABVLDL", "LDLCALC", "CHOLHDL" in the last 168 hours.  Hematology Recent Labs  Lab 07/29/22 0346 07/30/22 0137 07/30/22 0726  WBC 15.5* 12.3* 11.3*  RBC 2.71* 2.51* 2.61*  HGB 8.1* 7.8* 8.0*  HCT 24.7* 22.4* 23.7*  MCV 91.1 89.2 90.8  MCH 29.9 31.1 30.7  MCHC 32.8 34.8 33.8  RDW 20.2* 20.2* 20.7*  PLT 204 172 172   Thyroid No results for input(s): "TSH", "FREET4" in the last 168 hours.  BNPNo results for input(s): "BNP", "PROBNP" in the last 168 hours.  DDimer No results for input(s): "DDIMER" in the last 168 hours.   Radiology    DG Abd Portable 1V  Result Date: 07/29/2022 CLINICAL DATA:  Small-bowel obstruction in a 71 year old male. EXAM: PORTABLE ABDOMEN - 1 VIEW COMPARISON:  July 28, 2022. FINDINGS: Central venous access device incidentally noted in the lower chest terminating in the RIGHT atrium. Visualized lung bases are clear. Persistent moderate to marked small bowel distension throughout the abdomen. Dilution or further passage of contrast media into the colon. Question slightly decreased dilation of small bowel loops in the RIGHT lower quadrant. Signs of some gas and stool in the colon with similar distribution. Rectum not visualized. On limited assessment no acute skeletal findings. IMPRESSION: Signs of persistent presumed partial small bowel obstruction. Perhaps mildly diminished dilation of small bowel loops in the RIGHT lower quadrant, bowel loops in the LEFT hemiabdomen and central abdomen shows stable moderate to marked dilation. Electronically Signed   By: Zetta Bills M.D.   On: 07/29/2022 08:26   DG Abd Portable 1V-Small Bowel Obstruction Protocol-24 hr delay  Result Date: 07/28/2022 CLINICAL DATA:  Small-bowel obstruction. EXAM: PORTABLE ABDOMEN - 1 VIEW COMPARISON:  Abdominal radiographs dated 07/27/2022 and CT abdomen and pelvis dated 07/27/2022 FINDINGS: Enteric contrast appears to reach the hepatic flexure, suggestive of a partial small-bowel obstruction or resolving small bowel obstruction, however multiple dilated loops of small bowel are redemonstrated. IMPRESSION: Enteric contrast appears to reach the hepatic flexure, suggestive of a partial small-bowel obstruction or resolving small bowel obstruction given that multiple loops of small bowel remain dilated. Electronically Signed   By: Zerita Boers M.D.   On: 07/28/2022 13:52    Cardiac Studies   Cardiac MRI on 07/15/22 IMPRESSION: 1. Subendocardial LGE consistent with prior infarct in mid anterior/anteroseptal, apical anterior/septal/inferior/lateral walls, and apex. LGE is greater than 50% transmural suggesting nonviability in apical anterior/septal/inferior/lateral walls and apex. LGE <50% transmural  in mid anterior wall but wall thickness <4.75m suggests nonviability. Findings are consistent with nonviability in mid to distal LAD territory 2. Severe LV dilatation with severe systolic dysfunction (EF 238%. Thinning/akinesis of mid to apical anterior/anteroseptal walls and apical inferior/lateral walls. Aneurysm of apex 3.  LV apical thrombus measures 181mx 57m14m.  Normal RV size and systolic function (EF 48%10%. Diffuse pulmonary opacities and bilateral pleural effusions (moderate on right, small on left), consider CT chest for further Evaluation.  Echo in 06/2022  1. 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  Left ventricular ejection fraction, by estimation, is 30-35%. The left  ventricle is abnormal. The left  ventricle has focal regional wall motion abnormalities. The left  ventricular internal cavity size was mildly dilated. There is akinesis of  the left ventricular, apical septal wall, inferior wall, anterior wall and  lateral wall. There is akinesis of the  left ventricular, entire apical segment. There is severe hypokinesis of  the left ventricular, basal-mid anterior wall. A falst tendon is present  in the mid LV cavity.   Cath: 07/22/22     Prox LAD to Mid LAD lesion is 100% stenosed.   Dist Cx lesion is 80% stenosed.   A drug-eluting stent was successfully placed using a SYNERGY XD 3.0X20.   Post intervention, there is a 0% residual stenosis.   Successful PCI of the mid LCx with DES x 1     Plan: DAPT with ASA for one month, plavix for 12 months. OK to resume Coumadin tomorrow if no bleeding complications.     Diagnostic Dominance: Left  Intervention       Patient Profile     71 y.o. male with a PMHx of nonobstructive CAD 2011, AAA s/p repair 2013, HTN, HLD, T2DM, RCC s/p partial left nephrectomy 2015, OSA, GERD, RLS. who is being seen 07/18/2022 for the  evaluation of acute on chronic HFrEF, NSTEMI and known CAD at the request of the primary hospitalist service.  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 Nephrology following HD yesterday   Chronic systolic heart failure Ischemic cardiomyopathy LV thrombus Hypertension  - echo 06/12/22 with LVEF 30-35% and evidence of LV thrombus, WMA with akinesis of the anterior wall - cMRI confirmed thrombus - GDMT complicated by SBO - regimen may include coreg 6.25 mg BID, hydralazine 25 mg BID, isordil 20 mg BID - no ACEI/ARB/ARNI/SGLT2i given renal function - BP remains marginal with ST - consider transitioning coreg to toprol for more BP room to control HR   Coumadin therapy INR supratherapeutic now s/p Vit K with INR 1.4 Coumadin restarted last evening - 1 mg INR today 1.4 Caution given GI bleed   CAD  s/p DES-LCX, CTO of LAD Hyperlipidemia with LDL goal < 70 cMRI 10/13 showed no viabile myocardium Per Dr. Kipp Brood, not a candidate for CABG Given this, he proceeded with DES-LCX 07/22/22 Triple therapy with ASA, plavix, and coumadin x 30 days, then discontinued ASA 06/15/2022: Cholesterol 113; HDL 26; LDL Cholesterol 60; Triglycerides 137; VLDL 27 LP (a) 131 Medications as above: 40 mg lipitor, BB, isordil, and hydralazine   Toe discoloration Toes dusky on left foot, May have had embolic event but palpable pedal pulse  Back on anticoagulation    Bilateral leg pain Worse with exertion and when hypoxic Reassuring LE dopplers 07/11/22   Anemia GI bleed SBO Per primary Appreciate input from palliative care team   Cardiology follow up has been arranged.      For questions or updates, please contact [place] Please consult [email or web address] for contact info under        Signed, Jenkins Rouge, MD  07/30/2022, 10:13 AM

## 2022-07-30 NOTE — Progress Notes (Addendum)
PROGRESS NOTE    Allen Cortez  IOX:[id number] DOB: [birth year] DOA: 07/18/2022 PCP: Gerome Sam, MD  71 yo male with the past medical history of CAD, systolic heart failure, LV thrombus, right renal cancer sp partial left nephrectomy, T2DM, ESRD and chronic hypoxemic respiratory failure who presented with dyspnea. At home he had progressive dyspnea and orthopnea despite hemodialysis.  Wife reports ongoing decline since starting dialysis in August. -In the ED he was noted to be volume overloaded, chest x-ray noted bilateral interstitial edema, pleural effusions 10/17 Patient had urgent HD with ultrafiltration  10/19 HD. Plan for multidisciplinary cardiovascular meeting  to decide if patient will benefit more from PCI than CABG.  10/20 PCI, followed by Coffee ground emesis and abdominal pain post procedure. 1 unit PRBC transfusion.  10/21 GI consulted with recommendations of IV pantoprazole, high risk for endoscopic procedure.  10/22 PRBC transfusion due to low hgb below 8.  10/23 no further active bleeding, resumed anticoagulation.  10/24 tolerated HD, GI signed off 10/24-25: Increasing abdominal pain, vomiting and distention, CT overnight concerning for SBO 10/25: General surgery consulted, high risk, not surgical candidate and high risk for NG tube with upper GI bleed and anticoagulation with DAPT 10/26 onwards w/ confusion, also mentioned SI upon questioning by RN 10/27: seen by Psych-Signed off, not felt to be suicidal, Palliative consulting  Subjective: -seen on dialysis, remains confused, + Bm yesterday  Assessment and Plan:  Acute on chronic systolic CHF -Echo with drop in EF to 30-35%, akinesis of LV apical segment wall, inferior wall, antral anterior wall and lateral wall, severe hypokinesis of left ventricle basal mid anterior wall, preserved RV systolic function -Volume managed with HD, HD today -Continue carvedilol, hydralazine and Imdur  LV  thrombus -Restarted on warfarin, INR was supratherapeutic at 4.5, few days ago w/ GI bleeding, received vitamin K, then down to 1.4, warfarin resumed 10/26, INR remains subtherapeutic -Extremely complicated scenario with ongoing bleeding, LV thrombus and high risk of thromboembolic event  Upper GI bleed Acute blood loss anemia -Prior reports of coffee-ground emesis and melena -Hemoglobin chronically low, worsened this admission -Transfused 3 units of PRBC this admission -Treated with IV PPI, gastroenterology was consulted, felt to be high risk for endoscopic evaluation -Unfortunately needs dual antiplatelet therapy in the setting of new stent and warfarin for LV thrombus, high risk of further decompensation, hemoglobin appears stable today  Abdominal pain SBO -10/24 evening developed increasing abdominal pain with vomiting, CT concerning for small bowel obstruction with transition in the right lower quadrant, history of prior abdominal surgeries, could be secondary to ideations -High risk scenario, held off on NG tube with recent upper GI bleed,  -Appreciate general surgery evaluation, options are limited, repeat KUB with contrast in the colon, surgery has signed off -Tolerated some clears yesterday, will advance to full liquid diet  CAD with angina Elevated troponin Underwent left heart cath, proximal LAD to mid LAD was 100% stenosed, distal circumflex with 80% stenosis, successful PCI, stenting with DES to mid circumflex, recommendations to continue warfarin, aspirin for 1 month and Plavix for 6 months -Continue aspirin/Plavix/carvedilol statin -Then complicated by GI bleeding and followed by SBO  Confusion/delirium -in setting of above issues and some withdrawal w/ SBO and missing klonopin and seroquel -resumed seroquel, klonopin and celexa  Acute kidney injury with acute tubular necrosis (ATN) ([place]) ESRD on HD. -Started hemodialysis in August, followed by ongoing decline -Admitted  with volume overload, volume managed with HD, UF limited by cramping  Acute  on chronic hypoxemic respiratory failure due to acute pulmonary edema -Wean O2 as tolerated  Type 2 diabetes mellitus with hyperlipidemia (HCC) -CBGs are stable, hold insulin therapy, oral intake is poor  GERD (gastroesophageal reflux disease) Continue PPI  PAD (peripheral artery disease) ([place]) Continue with statin therapy.  -Mild duskiness of 2 toes of left foot, could have had an embolic event in the setting of LV thrombus, has palpable dorsalis pedis pulses, back on warfarin  Severe protein calorie malnutrition -Albumin is 1.9, no history of liver disease, supplements as tolerated  Depression -Mention feeling depressed to nurse yesterday and then when asked further questions he said yes to suicidal thoughts -Now sitter at bedside, psych consulted  Goals of care: Extensive list of medical problems in 70/M who started dialysis in August and has been subsequently declining, now admitted with worsening cardiomyopathy, LV thrombus, CAD requiring PCI and stenting on dual antiplatelet therapy and warfarin complicated by GI bleed, anemia and now SBO -Prognosis appears poor, severely malnourished his albumin is 1.9 -Palliative consulted for goals of care, meeting again on Monday, remains DNR and high risk of decompensation  DVT prophylaxis: warfarin Code Status: DNR Family Communication: No family at bedside, will update wife Disposition Plan: TBD, poor prognosis  Consultants: Nephrology, cardiology, palliative care, general surgery   Procedures: Cardiac MRI on 07/15/22 IMPRESSION: 1. Subendocardial LGE consistent with prior infarct in mid anterior/anteroseptal, apical anterior/septal/inferior/lateral walls, and apex. LGE is greater than 50% transmural suggesting nonviability in apical anterior/septal/inferior/lateral walls and apex. LGE <50% transmural in mid anterior wall but wall thickness <4.25m  suggests nonviability. Findings are consistent with nonviability in mid to distal LAD territory 2. Severe LV dilatation with severe systolic dysfunction (EF 277%. Thinning/akinesis of mid to apical anterior/anteroseptal walls and apical inferior/lateral walls. Aneurysm of apex 3.  LV apical thrombus measures 141mx 15m49m.  Normal RV size and systolic function (EF 48%41%. Diffuse pulmonary opacities and bilateral pleural effusions (moderate on right, small on left), consider CT chest for further Evaluation.   Echo in 06/2022  1. 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  Left ventricular ejection fraction, by estimation, is 30-35%. The left  ventricle is abnormal. The left  ventricle has focal regional wall motion abnormalities. The left  ventricular internal cavity size was mildly dilated. There is akinesis of  the left ventricular, apical septal wall, inferior wall, anterior wall and  lateral wall. There is akinesis of the  left ventricular, entire apical segment. There is severe hypokinesis of  the left ventricular, basal-mid anterior wall. A falst tendon is present  in the mid LV cavity.   Cath: 07/22/22     Prox LAD to Mid LAD lesion is 100% stenosed.   Dist Cx lesion is 80% stenosed.   A drug-eluting stent was successfully placed using a SYNERGY XD 3.0X20.   Post intervention, there is a 0% residual stenosis.   Successful PCI of the mid LCx with DES x 1     Plan: DAPT with ASA for one month, plavix for 12 months. OK to resume Coumadin tomorrow if no bleeding complications.     Antimicrobials:    Objective: Vitals:   07/30/22 0822 07/30/22 0830 07/30/22 0900 07/30/22 0930  BP: 108/72 115/74 103/76 108/72  Pulse: 100 96 97 95  Resp: (!) 27 (!) 24 19 (!) 26  Temp: 98 F (36.7 C)  TempSrc:      SpO2: 100% 99% 96% 100%  Weight: 61.5 kg     Height:       No intake  or output data in the 24 hours ending 07/30/22 1004   Filed Weights   07/28/22 1329 07/28/22 1722 07/30/22 0822  Weight: 61 kg 59.4 kg 61.5 kg    Examination:  General exam: Frail chronically ill male appears older than stated age, oriented to self only, more confused today HEENT: No JVD CVS: S1-S2, regular rhythm Lungs: Decreased breath sounds at the bases Abdomen: Firm, less distended, mild tenderness, decreased bowel sounds Extremities: Some duskiness noted on left foot toes, no edema Skin: No rashes Psychiatry: Used    Data Reviewed:   CBC: Recent Labs  Lab 07/27/22 0456 07/28/22 0305 07/29/22 0346 07/30/22 0137 07/30/22 0726  WBC 9.9 14.1* 15.5* 12.3* 11.3*  HGB 8.7* 9.3* 8.1* 7.8* 8.0*  HCT 26.2* 27.4* 24.7* 22.4* 23.7*  MCV 92.3 89.8 91.1 89.2 90.8  PLT 213 249 204 172 008   Basic Metabolic Panel: Recent Labs  Lab 07/26/22 0244 07/27/22 0456 07/28/22 0642 07/29/22 0346 07/30/22 0726  NA 132* 136 137 135 135  K 5.5* 4.9 5.4* 4.5 5.0  CL 91* 93* 91* 95* 95*  CO2 '23 23 22 24 '$ 21*  GLUCOSE 107* 112* 131* 96 98  BUN 78* 52* 92* 46* 83*  CREATININE 7.20* 5.11* 6.73* 4.10* 5.85*  CALCIUM 8.1* 7.8* 6.8* 7.2* 7.1*  PHOS 6.0*  --   --   --  8.2*   GFR: Estimated Creatinine Clearance: 9.9 mL/min (A) (by C-G formula based on SCr of 5.85 mg/dL (H)). Liver Function Tests: Recent Labs  Lab 07/26/22 0244 07/27/22 0456 07/30/22 0726  AST  --  24  --   ALT  --  14  --   ALKPHOS  --  66  --   BILITOT  --  1.3*  --   PROT  --  5.6*  --   ALBUMIN 2.1* 1.9* 1.8*   No results for input(s): "LIPASE", "AMYLASE" in the last 168 hours. No results for input(s): "AMMONIA" in the last 168 hours. Coagulation Profile: Recent Labs  Lab 07/26/22 0244 07/27/22 0456 07/28/22 0305 07/29/22 0346 07/30/22 0137  INR 3.4* 4.7* 1.4* 1.3* 1.4*   Cardiac Enzymes: No results for input(s): "CKTOTAL", "CKMB", "CKMBINDEX", "TROPONINI" in the last 168 hours. BNP (last 3  results) No results for input(s): "PROBNP" in the last 8760 hours. HbA1C: No results for input(s): "HGBA1C" in the last 72 hours. CBG: Recent Labs  Lab 07/29/22 0809 07/29/22 1129 07/29/22 1601 07/30/22 0046 07/30/22 0659  GLUCAP 109* 97 119* 101* 100*   Lipid Profile: No results for input(s): "CHOL", "HDL", "LDLCALC", "TRIG", "CHOLHDL", "LDLDIRECT" in the last 72 hours. Thyroid Function Tests: No results for input(s): "TSH", "T4TOTAL", "FREET4", "T3FREE", "THYROIDAB" in the last 72 hours. Anemia Panel: No results for input(s): "VITAMINB12", "FOLATE", "FERRITIN", "TIBC", "IRON", "RETICCTPCT" in the last 72 hours. Urine analysis:    Component Value Date/Time   COLORURINE YELLOW 06/12/2022 1720   APPEARANCEUR CLEAR 06/12/2022 1720   LABSPEC 1.010 06/12/2022 1720   PHURINE 5.0 06/12/2022 1720   GLUCOSEU NEGATIVE 06/12/2022 1720   HGBUR SMALL (A) 06/12/2022 1720   BILIRUBINUR NEGATIVE 06/12/2022 1720   KETONESUR NEGATIVE 06/12/2022 1720   PROTEINUR 30 (A) 06/12/2022 1720   UROBILINOGEN 0.2 12/23/2009 0857   NITRITE NEGATIVE 06/12/2022 1720   LEUKOCYTESUR NEGATIVE 06/12/2022 1720   Sepsis Labs: '@LABRCNTIP'$ (procalcitonin:4,lacticidven:4)  )  No results found for this or any previous visit (from the past 240 hour(s)).    Radiology Studies: DG Abd Portable 1V  Result Date: 07/29/2022 CLINICAL DATA:  Small-bowel obstruction in a 71 year old male. EXAM: PORTABLE ABDOMEN - 1 VIEW COMPARISON:  July 28, 2022. FINDINGS: Central venous access device incidentally noted in the lower chest terminating in the RIGHT atrium. Visualized lung bases are clear. Persistent moderate to marked small bowel distension throughout the abdomen. Dilution or further passage of contrast media into the colon. Question slightly decreased dilation of small bowel loops in the RIGHT lower quadrant. Signs of some gas and stool in the colon with similar distribution. Rectum not visualized. On limited assessment  no acute skeletal findings. IMPRESSION: Signs of persistent presumed partial small bowel obstruction. Perhaps mildly diminished dilation of small bowel loops in the RIGHT lower quadrant, bowel loops in the LEFT hemiabdomen and central abdomen shows stable moderate to marked dilation. Electronically Signed   By: Zetta Bills M.D.   On: 07/29/2022 08:26   DG Abd Portable 1V-Small Bowel Obstruction Protocol-24 hr delay  Result Date: 07/28/2022 CLINICAL DATA:  Small-bowel obstruction. EXAM: PORTABLE ABDOMEN - 1 VIEW COMPARISON:  Abdominal radiographs dated 07/27/2022 and CT abdomen and pelvis dated 07/27/2022 FINDINGS: Enteric contrast appears to reach the hepatic flexure, suggestive of a partial small-bowel obstruction or resolving small bowel obstruction, however multiple dilated loops of small bowel are redemonstrated. IMPRESSION: Enteric contrast appears to reach the hepatic flexure, suggestive of a partial small-bowel obstruction or resolving small bowel obstruction given that multiple loops of small bowel remain dilated. Electronically Signed   By: Zerita Boers M.D.   On: 07/28/2022 13:52     Scheduled Meds:  atorvastatin  40 mg Oral QHS   bisacodyl  10 mg Oral Q1200   carvedilol  6.25 mg Oral BID WC   Chlorhexidine Gluconate Cloth  6 each Topical Q0600   citalopram  20 mg Oral QHS   clonazePAM  0.5 mg Oral QHS   clopidogrel  75 mg Oral Q breakfast   darbepoetin (ARANESP) injection - DIALYSIS  150 mcg Subcutaneous Q Sat-1800   docusate sodium  100 mg Oral BID   feeding supplement (NEPRO CARB STEADY)  237 mL Oral TID BM   guaiFENesin  600 mg Oral BID   hydrALAZINE  25 mg Oral BID   insulin aspart  0-6 Units Subcutaneous TID AC & HS   isosorbide dinitrate  20 mg Oral BID   multivitamin  1 tablet Oral QHS   pantoprazole (PROTONIX) IV  40 mg Intravenous Q12H   polyethylene glycol  17 g Oral BID   QUEtiapine  300 mg Oral QHS   sodium chloride flush  3 mL Intravenous Q12H   Warfarin -  Pharmacist Dosing Inpatient   Does not apply q1600   Continuous Infusions:  sodium chloride     anticoagulant sodium citrate     promethazine (PHENERGAN) injection (IM or IVPB) Stopped (07/23/22 1026)     LOS: 12 days    Time spent: 8mn  PDomenic Polite MD [facility]   07/30/2022, 10:04 AM

## 2022-07-31 DIAGNOSIS — I5023 Acute on chronic systolic (congestive) heart failure: Secondary | ICD-10-CM | POA: Diagnosis not present

## 2022-07-31 DIAGNOSIS — I25119 Atherosclerotic heart disease of native coronary artery with unspecified angina pectoris: Secondary | ICD-10-CM | POA: Diagnosis not present

## 2022-07-31 DIAGNOSIS — I513 Intracardiac thrombosis, not elsewhere classified: Secondary | ICD-10-CM | POA: Diagnosis not present

## 2022-07-31 DIAGNOSIS — N170 Acute kidney failure with tubular necrosis: Secondary | ICD-10-CM | POA: Diagnosis not present

## 2022-07-31 DIAGNOSIS — E1169 Type 2 diabetes mellitus with other specified complication: Secondary | ICD-10-CM | POA: Diagnosis not present

## 2022-07-31 DIAGNOSIS — I255 Ischemic cardiomyopathy: Secondary | ICD-10-CM | POA: Diagnosis not present

## 2022-07-31 LAB — CBC
HCT: 23.8 % — ABNORMAL LOW (ref 39.0–52.0)
Hemoglobin: 8 g/dL — ABNORMAL LOW (ref 13.0–17.0)
MCH: 30.2 pg (ref 26.0–34.0)
MCHC: 33.6 g/dL (ref 30.0–36.0)
MCV: 89.8 fL (ref 80.0–100.0)
Platelets: 146 10*3/uL — ABNORMAL LOW (ref 150–400)
RBC: 2.65 MIL/uL — ABNORMAL LOW (ref 4.22–5.81)
RDW: 20.6 % — ABNORMAL HIGH (ref 11.5–15.5)
WBC: 9.8 10*3/uL (ref 4.0–10.5)
nRBC: 0.3 % — ABNORMAL HIGH (ref 0.0–0.2)

## 2022-07-31 LAB — BASIC METABOLIC PANEL
Anion gap: 15 (ref 5–15)
BUN: 46 mg/dL — ABNORMAL HIGH (ref 8–23)
CO2: 25 mmol/L (ref 22–32)
Calcium: 7.2 mg/dL — ABNORMAL LOW (ref 8.9–10.3)
Chloride: 98 mmol/L (ref 98–111)
Creatinine, Ser: 3.87 mg/dL — ABNORMAL HIGH (ref 0.61–1.24)
GFR, Estimated: 16 mL/min — ABNORMAL LOW (ref >60)
Glucose, Bld: 87 mg/dL (ref 70–99)
Potassium: 4.4 mmol/L (ref 3.5–5.1)
Sodium: 138 mmol/L (ref 135–145)

## 2022-07-31 LAB — PROTIME-INR
INR: 1.7 — ABNORMAL HIGH (ref 0.8–1.2)
Prothrombin Time: 19.9 seconds — ABNORMAL HIGH (ref 11.4–15.2)

## 2022-07-31 LAB — GLUCOSE, CAPILLARY
Glucose-Capillary: 83 mg/dL (ref 70–99)
Glucose-Capillary: 119 mg/dL — ABNORMAL HIGH (ref 70–99)
Glucose-Capillary: 131 mg/dL — ABNORMAL HIGH (ref 70–99)
Glucose-Capillary: 140 mg/dL — ABNORMAL HIGH (ref 70–99)
Glucose-Capillary: 211 mg/dL — ABNORMAL HIGH (ref 70–99)

## 2022-07-31 MED ORDER — CALCIUM ACETATE (PHOS BINDER) 667 MG PO CAPS
667.0000 mg | ORAL_CAPSULE | Freq: Three times a day (TID) | ORAL | Status: DC
  Administered 2022-08-01: 667 mg via ORAL
  Filled 2022-07-31: qty 1

## 2022-07-31 MED ORDER — WARFARIN SODIUM 2 MG PO TABS
3.0000 mg | ORAL_TABLET | Freq: Once | ORAL | Status: AC
  Administered 2022-07-31: 3 mg via ORAL
  Filled 2022-07-31: qty 1

## 2022-07-31 NOTE — Evaluation (Signed)
Clinical/Bedside Swallow Evaluation Patient Details  Name: Allen Cortez MRN: [id number] Date of Birth: June 18, 1951  Today's Date: 07/31/2022 Time: SLP Start Time (ACUTE ONLY): 1340 SLP Stop Time (ACUTE ONLY): 6073 SLP Time Calculation (min) (ACUTE ONLY): 15 min  Past Medical History:  Past Medical History:  Diagnosis Date   AAA (abdominal aortic aneurysm) ([place])    Abdominal wall mass of left lower quadrant    Basal cell carcinoma    CAD (coronary artery disease) 2011   moderate   Colon polyp    DDD (degenerative disc disease)    Depression    Diabetes mellitus type II    no meds   GERD (gastroesophageal reflux disease)    Hyperlipidemia    Neuromuscular disorder (HCC)    Osteopenia    PAD (peripheral artery disease) ([facility])    Renal cell carcinoma    Restless leg syndrome    Sleep apnea    used a cpap yr ago-does not snore or use it now.   Past Surgical History:  Past Surgical History:  Procedure Laterality Date   ABDOMINAL AORTIC ANEURYSM REPAIR     CORONARY STENT INTERVENTION N/A 07/22/2022   Procedure: CORONARY STENT INTERVENTION;  Surgeon: Martinique, Peter M, MD;  Location: [facility];  Service: Cardiovascular;  Laterality: N/A;   HEMORRHOID SURGERY     HERNIA REPAIR  03/06/12   ventral hernia repair   IR FLUORO GUIDE CV LINE RIGHT  06/20/2022   IR US GUIDE VASC ACCESS RIGHT  06/20/2022   NEPHRECTOMY     left due to renal cell carcenoma   VENTRAL HERNIA REPAIR  03/06/2012   Procedure: HERNIA REPAIR VENTRAL ADULT;  Surgeon: Joyice Faster. Cornett, MD;  Location: [facility];  Service: General;  Laterality: N/A;  excision of stitch granuloma   HPI:  Allen Cortez is a 71 y.o. male who presented to the ED with SOB and fatigue.  Prior medical history significant for end-stage renal disease on HD T/TH/Sat, CAD, systolic CHF, DM2, GERD, OSA, malnutrition, on home O2.  BSE on 10/17 with recommendations for regular solids and thin liquids.  CXR on 10/28  concerning for aspiration PNA with new orders for a bedside swallow evaluation.    Assessment / Plan / Recommendation  Clinical Impression  Pt was seen for a bedside swallow evaluation secondary to CXR on 10/28 concerning for aspiration PNA.  He presents with suspected mild oropharyngeal dysphagia with a possible esophageal component.  Pt was encountered asleep in bed with DIL present at bedside.  He roused to moderate verbal and tactile stimulation and required frequent stimulation to maintain alertness.  Pt consumed trials of thin liquid, puree, and soft solids.  He exhibited mildly prolonged mastication of soft solids likely secondary to lethargy and missing top dentures.  A delayed, weak, congested cough was observed following soft solid trial, otherwise no overt s/sx of aspiration were observed.  Of note, pt's DIL reported that the pt had been coughing in the absence of PO prior to SLP arrival.  Eructation was observed following thin liquid trials.  Recommend diet change to Dysphagia 1 (puree) solids and thin liquids with medication administered whole in puree.  SLP will f/u to monitor diet tolerance and advance as able.  SLP Visit Diagnosis: Dysphagia, oropharyngeal phase (R13.12)    Aspiration Risk  Mild aspiration risk    Diet Recommendation Dysphagia 1 (Puree);Thin liquid   Liquid Administration via: Cup;Straw Medication Administration: Whole meds with puree Supervision: Staff  to assist with self feeding;Full supervision/cueing for compensatory strategies Compensations: Minimize environmental distractions;Slow rate;Small sips/bites Postural Changes: Seated upright at 90 degrees    Other  Recommendations Oral Care Recommendations: Oral care BID    Recommendations for follow up therapy are one component of a multi-disciplinary discharge planning process, led by the attending physician.  Recommendations may be updated based on patient status, additional functional criteria and insurance  authorization.  Follow up Recommendations Skilled nursing-short term rehab (<3 hours/day)      Assistance Recommended at Discharge Frequent or constant Supervision/Assistance  Functional Status Assessment Patient has had a recent decline in their functional status and demonstrates the ability to make significant improvements in function in a reasonable and predictable amount of time.  Frequency and Duration min 2x/week  2 weeks       Prognosis Prognosis for Safe Diet Advancement: Fair      Swallow Study   General HPI: Allen Cortez is a 71 y.o. male who presented to the ED with SOB and fatigue.  Prior medical history significant for end-stage renal disease on HD T/TH/Sat, CAD, systolic CHF, DM2, GERD, OSA, malnutrition, on home O2.  BSE on 10/17 with recommendations for regular solids and thin liquids.  CXR on 10/28 concerning for aspiration PNA. Type of Study: Bedside Swallow Evaluation Previous Swallow Assessment: 10/17 Diet Prior to this Study: Dysphagia 3 (soft);Thin liquids Temperature Spikes Noted: No Respiratory Status: Nasal cannula History of Recent Intubation: No Behavior/Cognition: Cooperative;Lethargic/Drowsy;Requires cueing Oral Cavity Assessment: Within Functional Limits Oral Care Completed by SLP: No Oral Cavity - Dentition: Missing dentition (partial dentures on top) Vision: Functional for self-feeding Self-Feeding Abilities: Needs assist Patient Positioning: Upright in bed Baseline Vocal Quality: Low vocal intensity Volitional Cough: Weak;Congested Volitional Swallow: Able to elicit    Oral/Motor/Sensory Function Overall Oral Motor/Sensory Function: Within functional limits   Ice Chips Ice chips: Not tested   Thin Liquid Thin Liquid: Within functional limits    Nectar Thick Nectar Thick Liquid: Not tested   Honey Thick Honey Thick Liquid: Not tested   Puree Puree: Within functional limits   Solid     Solid: Impaired Presentation: Spoon Oral Phase  Impairments: Impaired mastication Oral Phase Functional Implications: Impaired mastication Pharyngeal Phase Impairments: Cough - Delayed     Bretta Bang, M.S., [place] Office: [phone]  [place] 07/31/2022,2:12 PM

## 2022-07-31 NOTE — Progress Notes (Signed)
PROGRESS NOTE    Allen Cortez  WUJ:[id number] DOB: May 31, 1951 DOA: 07/18/2022 PCP: Gerome Sam, MD  71 yo male with the past medical history of CAD, systolic heart failure, LV thrombus, right renal cancer sp partial left nephrectomy, T2DM, ESRD and chronic hypoxemic respiratory failure who presented with dyspnea. At home he had progressive dyspnea and orthopnea despite hemodialysis.  Wife reports ongoing decline since starting dialysis in August. -In the ED he was noted to be volume overloaded, chest x-ray noted bilateral interstitial edema, pleural effusions 10/17 Patient had urgent HD with ultrafiltration  10/19 HD. Plan for multidisciplinary cardiovascular meeting  to decide if patient will benefit more from PCI than CABG.  10/20 PCI, followed by Coffee ground emesis and abdominal pain post procedure. 1 unit PRBC transfusion.  10/21 GI consulted with recommendations of IV pantoprazole, high risk for endoscopic procedure.  10/22 PRBC transfusion due to low hgb below 8.  10/23 no further active bleeding, resumed anticoagulation.  10/24 tolerated HD, GI signed off 10/24-25: Increasing abdominal pain, vomiting and distention, CT overnight concerning for SBO 10/25: General surgery consulted, high risk, not surgical candidate and high risk for NG tube with upper GI bleed and anticoagulation with DAPT 10/26 onwards w/ confusion, also mentioned SI upon questioning by RN 10/27: seen by Psych-Signed off, not felt to be suicidal, Palliative consulting 10/28: Noted to be more rhonchorous, with congested cough, chest x-ray concerning for aspiration pneumonia-Unasyn started  Subjective: -Dialyzed yesterday, had a BM, oral intake is poor, intermittently confused, still coughing  Assessment and Plan:  Acute on chronic systolic CHF -Echo with drop in EF to 30-35%, akinesis of LV apical segment wall, inferior wall, antral anterior wall and lateral wall, severe hypokinesis of left  ventricle basal mid anterior wall, preserved RV systolic function -Volume managed with HD, HD today -Continue carvedilol, hydralazine and Imdur  LV thrombus -Restarted on warfarin, INR was supratherapeutic at 4.5, few days ago w/ GI bleeding, received vitamin K, then down to 1.4, warfarin resumed 10/26, INR remains subtherapeutic, up to 1.7 today -Extremely complicated scenario with ongoing bleeding, LV thrombus and high risk of thromboembolic event  Upper GI bleed Acute blood loss anemia -Prior reports of coffee-ground emesis and melena -Hemoglobin chronically low, worsened this admission -Transfused 3 units of PRBC this admission -Treated with IV PPI, gastroenterology was consulted, felt to be high risk for endoscopic evaluation -Unfortunately needs dual antiplatelet therapy in the setting of new stent and warfarin for LV thrombus,  -Hemoglobin stable today  Abdominal pain SBO -10/24 evening developed increasing abdominal pain with vomiting, CT concerning for small bowel obstruction with transition in the right lower quadrant, history of prior abdominal surgeries, could be secondary to ideations -High risk scenario, held off on NG tube with recent upper GI bleed,  -Appreciate general surgery evaluation, options are limited, repeat KUB with contrast in the colon, surgery has signed off -Will advance to soft diet  Aspiration pneumonia -Started antibiotics 10/28, continue Unasyn for 5 to 7 days -SLP eval, pulmonary toilet  CAD with angina Elevated troponin Underwent left heart cath, proximal LAD to mid LAD was 100% stenosed, distal circumflex with 80% stenosis, successful PCI, stenting with DES to mid circumflex, recommendations to continue warfarin, aspirin for 1 month and Plavix for 6 months -Continue aspirin/Plavix/carvedilol statin -Then complicated by GI bleeding and followed by SBO  Confusion/delirium -in setting of above issues and some withdrawal w/ SBO and missing klonopin  and seroquel -resumed seroquel, klonopin and celexa  Acute kidney  injury with acute tubular necrosis (ATN) (HCC) ESRD on HD. -Started hemodialysis in August, followed by ongoing decline -Admitted with volume overload, volume managed with HD, UF limited by cramping  Acute on chronic hypoxemic respiratory failure due to acute pulmonary edema -Wean O2 as tolerated  Type 2 diabetes mellitus with hyperlipidemia (HCC) -CBGs are stable, hold insulin therapy, oral intake is poor  GERD (gastroesophageal reflux disease) Continue PPI  PAD (peripheral artery disease) ([place]) Continue with statin therapy.  -Mild duskiness of 2 toes of left foot, could have had an embolic event in the setting of LV thrombus, has palpable dorsalis pedis pulses, back on warfarin  Severe protein calorie malnutrition -Albumin is 1.9, no history of liver disease, supplements as tolerated  Depression -Mention feeling depressed to nurse and then when asked further questions he said yes to suicidal thoughts -Now sitter at bedside, psych consulted, appreciate psych eval, not felt to be suicidal  Goals of care: Extensive list of medical problems in 70/M who started dialysis in August and has been subsequently declining, now admitted with worsening cardiomyopathy, LV thrombus, CAD requiring PCI and stenting on dual antiplatelet therapy and warfarin complicated by GI bleed, worsening anemia, followed by SBO and now aspiration pneumonia  -Prognosis appears poor, severely malnourished his albumin is 1.9 -Palliative consulted for goals of care, meeting again on Monday, remains DNR and high risk of decompensation  DVT prophylaxis: warfarin Code Status: DNR Family Communication: No family at bedside, updated wife yesterday Disposition Plan: TBD, poor prognosis  Consultants: Nephrology, cardiology, palliative care, general surgery   Procedures: Cardiac MRI on 07/15/22 IMPRESSION: 1. Subendocardial LGE consistent with prior  infarct in mid anterior/anteroseptal, apical anterior/septal/inferior/lateral walls, and apex. LGE is greater than 50% transmural suggesting nonviability in apical anterior/septal/inferior/lateral walls and apex. LGE <50% transmural in mid anterior wall but wall thickness <4.24m suggests nonviability. Findings are consistent with nonviability in mid to distal LAD territory 2. Severe LV dilatation with severe systolic dysfunction (EF 251%. Thinning/akinesis of mid to apical anterior/anteroseptal walls and apical inferior/lateral walls. Aneurysm of apex 3.  LV apical thrombus measures 158mx 39m539m.  Normal RV size and systolic function (EF 48%02%. Diffuse pulmonary opacities and bilateral pleural effusions (moderate on right, small on left), consider CT chest for further Evaluation.   Echo in 06/2022  1. 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  Left ventricular ejection fraction, by estimation, is 30-35%. The left  ventricle is abnormal. The left  ventricle has focal regional wall motion abnormalities. The left  ventricular internal cavity size was mildly dilated. There is akinesis of  the left ventricular, apical septal wall, inferior wall, anterior wall and  lateral wall. There is akinesis of the  left ventricular, entire apical segment. There is severe hypokinesis of  the left ventricular, basal-mid anterior wall. A falst tendon is present  in the mid LV cavity.   Cath: 07/22/22     Prox LAD to Mid LAD lesion is 100% stenosed.   Dist Cx lesion is 80% stenosed.   A drug-eluting stent was successfully placed using a SYNERGY XD 3.0X20.   Post intervention, there is a 0% residual stenosis.   Successful PCI of the mid LCx with DES x 1     Plan: DAPT with ASA for one month, plavix for 12 months. OK to resume Coumadin tomorrow if no bleeding complications.  Antimicrobials:     Objective: Vitals:   07/30/22 1900 07/30/22 2120 07/30/22 2216 07/31/22 0520  BP: 110/70  113/72 96/66  Pulse: (!) 101  99 96  Resp:    20  Temp:   98 F (36.7 C) 99.1 F (37.3 C)  TempSrc:   Oral Oral  SpO2: 98% 98% 93% 98%  Weight:      Height:        Intake/Output Summary (Last 24 hours) at 07/31/2022 1038 Last data filed at 07/31/2022 4235 Gross per 24 hour  Intake 472.67 ml  Output 900 ml  Net -427.33 ml     Filed Weights   07/28/22 1722 07/30/22 0822 07/30/22 1321  Weight: 59.4 kg 61.5 kg 60.3 kg    Examination:  General exam: Frail chronically ill male appears much older than stated age, oriented to self only, some confusion, less agitated HEENT: No JVD CVS: S1-S2, regular rhythm Lungs: Scattered rhonchi bilaterally, predominantly on the right Abdomen: Soft, less distended, less tender, positive bowel sounds  Extremities: Some duskiness noted on left foot toes, no edema Skin: No rashes Psychiatry: Flat affect    Data Reviewed:   CBC: Recent Labs  Lab 07/28/22 0305 07/29/22 0346 07/30/22 0137 07/30/22 0726 07/31/22 0131  WBC 14.1* 15.5* 12.3* 11.3* 9.8  HGB 9.3* 8.1* 7.8* 8.0* 8.0*  HCT 27.4* 24.7* 22.4* 23.7* 23.8*  MCV 89.8 91.1 89.2 90.8 89.8  PLT 249 204 172 172 361*   Basic Metabolic Panel: Recent Labs  Lab 07/26/22 0244 07/27/22 0456 07/28/22 0642 07/29/22 0346 07/30/22 0726 07/31/22 0131  NA 132* 136 137 135 135 138  K 5.5* 4.9 5.4* 4.5 5.0 4.4  CL 91* 93* 91* 95* 95* 98  CO2 '23 23 22 24 '$ 21* 25  GLUCOSE 107* 112* 131* 96 98 87  BUN 78* 52* 92* 46* 83* 46*  CREATININE 7.20* 5.11* 6.73* 4.10* 5.85* 3.87*  CALCIUM 8.1* 7.8* 6.8* 7.2* 7.1* 7.2*  PHOS 6.0*  --   --   --  8.2*  --    GFR: Estimated Creatinine Clearance: 14.7 mL/min (A) (by C-G formula based on SCr of 3.87 mg/dL (H)). Liver Function Tests: Recent Labs  Lab 07/26/22 0244 07/27/22 0456 07/30/22 0726  AST  --  24  --   ALT  --  14  --   ALKPHOS  --  66   --   BILITOT  --  1.3*  --   PROT  --  5.6*  --   ALBUMIN 2.1* 1.9* 1.8*   No results for input(s): "LIPASE", "AMYLASE" in the last 168 hours. No results for input(s): "AMMONIA" in the last 168 hours. Coagulation Profile: Recent Labs  Lab 07/27/22 0456 07/28/22 0305 07/29/22 0346 07/30/22 0137 07/31/22 0131  INR 4.7* 1.4* 1.3* 1.4* 1.7*   Cardiac Enzymes: No results for input(s): "CKTOTAL", "CKMB", "CKMBINDEX", "TROPONINI" in the last 168 hours. BNP (last 3 results) No results for input(s): "PROBNP" in the last 8760 hours. HbA1C: No results for input(s): "HGBA1C" in the last 72 hours. CBG: Recent Labs  Lab 07/30/22 0046 07/30/22 0659 07/30/22 1448 07/30/22 2120 07/31/22 0830  GLUCAP 101* 100* 83 84 83   Lipid Profile: No results for input(s): "CHOL", "HDL", "LDLCALC", "TRIG", "CHOLHDL", "LDLDIRECT" in the last 72 hours. Thyroid Function Tests: No results for input(s): "TSH", "T4TOTAL", "FREET4", "T3FREE", "THYROIDAB" in the last 72 hours. Anemia Panel: No results for input(s): "VITAMINB12", "FOLATE", "FERRITIN", "TIBC", "IRON", "RETICCTPCT" in the last 72 hours. Urine analysis:  Component Value Date/Time   COLORURINE YELLOW 06/12/2022 1720   APPEARANCEUR CLEAR 06/12/2022 1720   LABSPEC 1.010 06/12/2022 1720   PHURINE 5.0 06/12/2022 1720   GLUCOSEU NEGATIVE 06/12/2022 1720   HGBUR SMALL (A) 06/12/2022 1720   BILIRUBINUR NEGATIVE 06/12/2022 1720   KETONESUR NEGATIVE 06/12/2022 1720   PROTEINUR 30 (A) 06/12/2022 1720   UROBILINOGEN 0.2 12/23/2009 0857   NITRITE NEGATIVE 06/12/2022 1720   LEUKOCYTESUR NEGATIVE 06/12/2022 1720   Sepsis Labs: '@LABRCNTIP'$ (procalcitonin:4,lacticidven:4)  ) No results found for this or any previous visit (from the past 240 hour(s)).    Radiology Studies: DG CHEST PORT 1 VIEW  Result Date: 07/30/2022 CLINICAL DATA:  Hypoxia EXAM: PORTABLE CHEST 1 VIEW COMPARISON:  July 26, 2022 FINDINGS: Stable cardiomegaly. The hila and  mediastinum are unremarkable. A right central line is stable. No pneumothorax. The left lung is clear. Diffuse interstitial opacity throughout the right lung with more focal opacity in the right perihilar region. IMPRESSION: 1. Diffuse increased interstitial opacity in the right lung with more focal opacity in the right perihilar region. The more focal region of opacity is concerning for pneumonia or aspiration. Asymmetric edema is considered less likely. Recommend clinical correlation. Electronically Signed   By: Dorise Bullion III M.D.   On: 07/30/2022 14:46     Scheduled Meds:  atorvastatin  40 mg Oral QHS   bisacodyl  10 mg Oral Q1200   carvedilol  6.25 mg Oral BID WC   Chlorhexidine Gluconate Cloth  6 each Topical Q0600   citalopram  20 mg Oral QHS   clonazePAM  0.5 mg Oral QHS   clopidogrel  75 mg Oral Q breakfast   darbepoetin (ARANESP) injection - DIALYSIS  150 mcg Subcutaneous Q Sat-1800   docusate sodium  100 mg Oral BID   feeding supplement (NEPRO CARB STEADY)  237 mL Oral TID BM   guaiFENesin  600 mg Oral BID   hydrALAZINE  25 mg Oral BID   insulin aspart  0-6 Units Subcutaneous TID AC & HS   isosorbide dinitrate  20 mg Oral BID   multivitamin  1 tablet Oral QHS   pantoprazole (PROTONIX) IV  40 mg Intravenous Q12H   polyethylene glycol  17 g Oral BID   QUEtiapine  300 mg Oral QHS   sodium chloride flush  3 mL Intravenous Q12H   warfarin  3 mg Oral ONCE-1600   warfarin  3 mg Oral ONCE-1600   Warfarin - Pharmacist Dosing Inpatient   Does not apply q1600   Continuous Infusions:  sodium chloride     ampicillin-sulbactam (UNASYN) IV Stopped (07/30/22 1742)   promethazine (PHENERGAN) injection (IM or IVPB) Stopped (07/23/22 1026)     LOS: 13 days    Time spent: 70mn  PDomenic Polite MD [facility]   07/31/2022, 10:38 AM

## 2022-07-31 NOTE — Progress Notes (Signed)
ANTICOAGULATION CONSULT NOTE  Pharmacy Consult for Coumadin Indication: LV thrombus  Allergies  Allergen Reactions   Dilaudid [Hydromorphone Hcl] Other (See Comments)    hallucinations   Mirapex [Pramipexole Dihydrochloride] Other (See Comments)    Leg pain   Prednisone Other (See Comments)    irritability   Gabapentin     Other reaction(s): Tremor    Patient Measurements: Height: '5\' 10"'$  (177.8 cm) Weight: 60.3 kg (132 lb 15 oz) IBW/kg (Calculated) : 73  Vital Signs: Temp: 99.1 F (37.3 C) (10/29 0520) Temp Source: Oral (10/29 0520) BP: 96/66 (10/29 0520) Pulse Rate: 96 (10/29 0520)  Labs: Recent Labs    07/29/22 0346 07/30/22 0137 07/30/22 0726 07/31/22 0131  HGB 8.1* 7.8* 8.0* 8.0*  HCT 24.7* 22.4* 23.7* 23.8*  PLT 204 172 172 146*  LABPROT 16.1* 16.9*  --  19.9*  INR 1.3* 1.4*  --  1.7*  CREATININE 4.10*  --  5.85* 3.87*     Estimated Creatinine Clearance: 14.7 mL/min (A) (by C-G formula based on SCr of 3.87 mg/dL (H)).   Medical History: Past Medical History:  Diagnosis Date   AAA (abdominal aortic aneurysm) (HCC)    Abdominal wall mass of left lower quadrant    Basal cell carcinoma    CAD (coronary artery disease) 2011   moderate   Colon polyp    DDD (degenerative disc disease)    Depression    Diabetes mellitus type II    no meds   GERD (gastroesophageal reflux disease)    Hyperlipidemia    Neuromuscular disorder (HCC)    Osteopenia    PAD (peripheral artery disease) (HCC)    Renal cell carcinoma    Restless leg syndrome    Sleep apnea    used a cpap yr ago-does not snore or use it now.   Assessment: 71 yo male on warfarin PTA for recent LV thrombus. He is also noted with ESRD and s/p emergent HD. S/p PCI 10/20, heparin held d/t 1 episode of coffee-ground emesis, no reports of bleeding since. Pharmacy consulted to bridge heparin to warfarin.   Heparin stopped due to elevated INR and concern for GIB. Vit K given 10/25, INR is still  subtherapeutic today at 1.7. Will be cautious with warfarin dosing given poor po intake lately and recent supratherapeutic INR. Hgb stable, plt 146.    Goal of Therapy:  INR 2-3 Monitor platelets by anticoagulation protocol: Yes   Plan:  Give Warfarin '3mg'$  x1 Continue plavix Monitor daily INR and signs/symptoms of bleeding  [name], PharmD, [facility] PGY1 Pharmacy Resident 07/31/2022 8:03 AM

## 2022-07-31 NOTE — Progress Notes (Addendum)
[facility] Progress Note   Subjective:    Hypotensive during HD yesterday, agitated and needing a lot of redirection.  Alone in room today. Sleeping, gets restless when woken up.   Objective Vitals:   07/30/22 1900 07/30/22 2120 07/30/22 2216 07/31/22 0520  BP: 110/70  113/72 96/66  Pulse: (!) 101  99 96  Resp:    20  Temp:   98 F (36.7 C) 99.1 F (37.3 C)  TempSrc:   Oral Oral  SpO2: 98% 98% 93% 98%  Weight:      Height:       Physical Exam General: chronically ill appearing, on nasal O2  Heart: RRR, no mrg Lungs: clear anteriorly  Abdomen: soft, non-tender  Extremities: no LE edema Dialysis Access: [facility]   Filed Weights   07/28/22 1722 07/30/22 0822 07/30/22 1321  Weight: 59.4 kg 61.5 kg 60.3 kg    Intake/Output Summary (Last 24 hours) at 07/31/2022 1035 Last data filed at 07/31/2022 0339 Gross per 24 hour  Intake 472.67 ml  Output 900 ml  Net -427.33 ml     Additional Objective Labs: Basic Metabolic Panel: Recent Labs  Lab 07/26/22 0244 07/27/22 0456 07/29/22 0346 07/30/22 0726 07/31/22 0131  NA 132*   < > 135 135 138  K 5.5*   < > 4.5 5.0 4.4  CL 91*   < > 95* 95* 98  CO2 23   < > 24 21* 25  GLUCOSE 107*   < > 96 98 87  BUN 78*   < > 46* 83* 46*  CREATININE 7.20*   < > 4.10* 5.85* 3.87*  CALCIUM 8.1*   < > 7.2* 7.1* 7.2*  PHOS 6.0*  --   --  8.2*  --    < > = values in this interval not displayed.    Liver Function Tests: Recent Labs  Lab 07/26/22 0244 07/27/22 0456 07/30/22 0726  AST  --  24  --   ALT  --  14  --   ALKPHOS  --  66  --   BILITOT  --  1.3*  --   PROT  --  5.6*  --   ALBUMIN 2.1* 1.9* 1.8*     CBC: Recent Labs  Lab 07/28/22 0305 07/29/22 0346 07/30/22 0137 07/30/22 0726 07/31/22 0131  WBC 14.1* 15.5* 12.3* 11.3* 9.8  HGB 9.3* 8.1* 7.8* 8.0* 8.0*  HCT 27.4* 24.7* 22.4* 23.7* 23.8*  MCV 89.8 91.1 89.2 90.8 89.8  PLT 249 204 172 172 146*    CBG: Recent Labs  Lab 07/30/22 0046  07/30/22 0659 07/30/22 1448 07/30/22 2120 07/31/22 0830  GLUCAP 101* 100* 83 84 83    Studies/Results: DG CHEST PORT 1 VIEW  Result Date: 07/30/2022 CLINICAL DATA:  Hypoxia EXAM: PORTABLE CHEST 1 VIEW COMPARISON:  July 26, 2022 FINDINGS: Stable cardiomegaly. The hila and mediastinum are unremarkable. A right central line is stable. No pneumothorax. The left lung is clear. Diffuse interstitial opacity throughout the right lung with more focal opacity in the right perihilar region. IMPRESSION: 1. Diffuse increased interstitial opacity in the right lung with more focal opacity in the right perihilar region. The more focal region of opacity is concerning for pneumonia or aspiration. Asymmetric edema is considered less likely. Recommend clinical correlation. Electronically Signed   By: Dorise Bullion III M.D.   On: 07/30/2022 14:46    Medications:  sodium chloride     ampicillin-sulbactam (UNASYN) IV Stopped (07/30/22 1742)   promethazine (  PHENERGAN) injection (IM or IVPB) Stopped (07/23/22 1026)    atorvastatin  40 mg Oral QHS   bisacodyl  10 mg Oral Q1200   carvedilol  6.25 mg Oral BID WC   Chlorhexidine Gluconate Cloth  6 each Topical Q0600   citalopram  20 mg Oral QHS   clonazePAM  0.5 mg Oral QHS   clopidogrel  75 mg Oral Q breakfast   darbepoetin (ARANESP) injection - DIALYSIS  150 mcg Subcutaneous Q Sat-1800   docusate sodium  100 mg Oral BID   feeding supplement (NEPRO CARB STEADY)  237 mL Oral TID BM   guaiFENesin  600 mg Oral BID   hydrALAZINE  25 mg Oral BID   insulin aspart  0-6 Units Subcutaneous TID AC & HS   isosorbide dinitrate  20 mg Oral BID   multivitamin  1 tablet Oral QHS   pantoprazole (PROTONIX) IV  40 mg Intravenous Q12H   polyethylene glycol  17 g Oral BID   QUEtiapine  300 mg Oral QHS   sodium chloride flush  3 mL Intravenous Q12H   warfarin  3 mg Oral ONCE-1600   warfarin  3 mg Oral ONCE-1600   Warfarin - Pharmacist Dosing Inpatient   Does not apply  q1600    Dialysis Orders: [facility] 4h 400/A1.5x EDW 70kg 3K/2.25Ca TDC Heparin 2000 U    Assessment/Plan: Acute hypoxic resp failure - likely related to volume overload. CXR w/CHF and b/l pleural effusions.  Improved, now under dry weight.  Repeat CXR 10/23 with persistent cardiomegaly and pulmonary edema. Probable layering pleural fluid.  UF limited by cramping/hypotension.  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 Continue HD TTS. Agitated and needing a lot of redirection during HD Sat. SBO - identified on CT 10/24.  Surgery consulted and patient is not good candidate for surgery or NG tube placement.  On bowel rest.  HTN - BP ok. Coreg 6.'25mg'$  BID, hydralazine '25mg'$  BID, and isordil '20mg'$  BID.  d/c home lasix d/t minimal to no UOP. Now well below OP dry weight. Minimal UF.  CAD -CTS consulted - no plans for surgery at this time, high risk.  s/p DES to circ on 10/20. Needs triple AC therapy - ASA, plavix and coumadin x 1 month, then just plavix, coumadin. Per GI ok with ASA, plavix therapy continued as long as no overt bleeding, hold coumadin if ongoing drop in Hgb, ok to restart heparin if Hgb stable.  Per cardiology A/C systolic CHF - echo with EF 30-35%.  Meds limited by kidney function.  Volume removal with HD.  Anemia-  Hgb 8.1  s/p 3 units prbcs since admission. Tsat 19%, getting iron load.  ESA 150 qSat. Bones-  CorrCa ok. Hyperphosphatemia --resume binders when eating.  Nutrition - On liquid diet  SI - Hx PTSD. Psych consult, sitter  [place] - DNR. Palliative care consulted. Plans for family meeting Monday. Becoming more evident that he may no longer be suitable for outpatient dialysis.    Lynnda Child PA-C [facility] 07/31/2022,10:35 AM   Seen and examined independently.  Agree with note and exam as documented above by physician extender and as noted here.  Spoke with his daughter in law at bedside.  He has been a little more like himself today.   Was off some meds for a few days and they state he has PTSD.   General frail chronically ill appearing elderly male in bed in no acute distress  HEENT normocephalic atraumatic extraocular movements intact sclera anicteric Neck supple trachea midline Lungs clear to auscultation bilaterally normal work of breathing at rest on 2 liters oxygen  Heart S1S2 no rub Extremities no edema  Neuro hard of hearing and speaks very softly; oriented to person and location of [facility] Access RIJ tunn catheter   ESRD - HD per TTS schedule   Anemia of CKD - on ESA 150 mcg every Sat   Small bowel obstruction - per primary team.  Now has a diet order   CAD - as above.  Per cardiology. On medical therapy    HTN - acceptable   Metabolic bone disease - have started phoslo.  (Avoiding renvela with his bowel issues)  Palliative care consulted.  Note family meeting planned for 10/30.  appreciate their assistance.  he is struggling   Claudia Desanctis, MD 07/31/2022  5:23 PM

## 2022-07-31 NOTE — Progress Notes (Signed)
Rounding Note    Patient Name: Allen Cortez Date of Encounter: 07/31/2022  [place] Cardiologist: Shelva Majestic, MD   Subjective   Sats good telemetry NSR no pain  Inpatient Medications    Scheduled Meds:  atorvastatin  40 mg Oral QHS   bisacodyl  10 mg Oral Q1200   carvedilol  6.25 mg Oral BID WC   Chlorhexidine Gluconate Cloth  6 each Topical Q0600   citalopram  20 mg Oral QHS   clonazePAM  0.5 mg Oral QHS   clopidogrel  75 mg Oral Q breakfast   darbepoetin (ARANESP) injection - DIALYSIS  150 mcg Subcutaneous Q Sat-1800   docusate sodium  100 mg Oral BID   feeding supplement (NEPRO CARB STEADY)  237 mL Oral TID BM   guaiFENesin  600 mg Oral BID   hydrALAZINE  25 mg Oral BID   insulin aspart  0-6 Units Subcutaneous TID AC & HS   isosorbide dinitrate  20 mg Oral BID   multivitamin  1 tablet Oral QHS   pantoprazole (PROTONIX) IV  40 mg Intravenous Q12H   polyethylene glycol  17 g Oral BID   QUEtiapine  300 mg Oral QHS   sodium chloride flush  3 mL Intravenous Q12H   warfarin  3 mg Oral ONCE-1600   warfarin  3 mg Oral ONCE-1600   Warfarin - Pharmacist Dosing Inpatient   Does not apply q1600   Continuous Infusions:  sodium chloride     ampicillin-sulbactam (UNASYN) IV Stopped (07/30/22 1742)   promethazine (PHENERGAN) injection (IM or IVPB) Stopped (07/23/22 1026)   PRN Meds: sodium chloride, acetaminophen **OR** acetaminophen, albuterol, HYDROmorphone (DILAUDID) injection, LORazepam, ondansetron (ZOFRAN) IV, mouth rinse, promethazine (PHENERGAN) injection (IM or IVPB), sodium chloride flush   Vital Signs    Vitals:   07/30/22 1900 07/30/22 2120 07/30/22 2216 07/31/22 0520  BP: 110/70  113/72 96/66  Pulse: (!) 101  99 96  Resp:    20  Temp:   98 F (36.7 C) 99.1 F (37.3 C)  TempSrc:   Oral Oral  SpO2: 98% 98% 93% 98%  Weight:      Height:        Intake/Output Summary (Last 24 hours) at 07/31/2022 1002 Last data filed at 07/31/2022  0339 Gross per 24 hour  Intake 472.67 ml  Output 900 ml  Net -427.33 ml       07/30/2022    1:21 PM 07/30/2022    8:22 AM 07/28/2022    5:22 PM  Last 3 Weights  Weight (lbs) 132 lb 15 oz 135 lb 9.3 oz 130 lb 15.3 oz  Weight (kg) 60.3 kg 61.5 kg 59.4 kg      Telemetry    Sinus rhythm with HR 80-90s - with PVC;s   ECG    No new tracings - Personally Reviewed  Physical Exam   Lethargic Right subclavian dialysis catheter  Rhonchi Abdomen soft  Dusky left 2/3 rd toes palpable pedal pulse   Labs    High Sensitivity Troponin:   Recent Labs  Lab 07/18/22 1653 07/18/22 1845 07/18/22 2045 07/19/22 0454  TROPONINIHS 266* 271* 256* 288*     Chemistry Recent Labs  Lab 07/26/22 0244 07/27/22 0456 07/28/22 0642 07/29/22 0346 07/30/22 0726 07/31/22 0131  NA 132* 136   < > 135 135 138  K 5.5* 4.9   < > 4.5 5.0 4.4  CL 91* 93*   < > 95* 95* 98  CO2 23  23   < > 24 21* 25  GLUCOSE 107* 112*   < > 96 98 87  BUN 78* 52*   < > 46* 83* 46*  CREATININE 7.20* 5.11*   < > 4.10* 5.85* 3.87*  CALCIUM 8.1* 7.8*   < > 7.2* 7.1* 7.2*  PROT  --  5.6*  --   --   --   --   ALBUMIN 2.1* 1.9*  --   --  1.8*  --   AST  --  24  --   --   --   --   ALT  --  14  --   --   --   --   ALKPHOS  --  66  --   --   --   --   BILITOT  --  1.3*  --   --   --   --   GFRNONAA 7* 11*   < > 15* 10* 16*  ANIONGAP 18* 20*   < > 16* 19* 15   < > = values in this interval not displayed.    Lipids No results for input(s): "CHOL", "TRIG", "HDL", "LABVLDL", "LDLCALC", "CHOLHDL" in the last 168 hours.  Hematology Recent Labs  Lab 07/30/22 0137 07/30/22 0726 07/31/22 0131  WBC 12.3* 11.3* 9.8  RBC 2.51* 2.61* 2.65*  HGB 7.8* 8.0* 8.0*  HCT 22.4* 23.7* 23.8*  MCV 89.2 90.8 89.8  MCH 31.1 30.7 30.2  MCHC 34.8 33.8 33.6  RDW 20.2* 20.7* 20.6*  PLT 172 172 146*   Thyroid No results for input(s): "TSH", "FREET4" in the last 168 hours.  BNPNo results for input(s): "BNP", "PROBNP" in the last 168  hours.  DDimer No results for input(s): "DDIMER" in the last 168 hours.   Radiology    DG CHEST PORT 1 VIEW  Result Date: 07/30/2022 CLINICAL DATA:  Hypoxia EXAM: PORTABLE CHEST 1 VIEW COMPARISON:  July 26, 2022 FINDINGS: Stable cardiomegaly. The hila and mediastinum are unremarkable. A right central line is stable. No pneumothorax. The left lung is clear. Diffuse interstitial opacity throughout the right lung with more focal opacity in the right perihilar region. IMPRESSION: 1. Diffuse increased interstitial opacity in the right lung with more focal opacity in the right perihilar region. The more focal region of opacity is concerning for pneumonia or aspiration. Asymmetric edema is considered less likely. Recommend clinical correlation. Electronically Signed   By: Dorise Bullion III M.D.   On: 07/30/2022 14:46    Cardiac Studies   Cardiac MRI on 07/15/22 IMPRESSION: 1. Subendocardial LGE consistent with prior infarct in mid anterior/anteroseptal, apical anterior/septal/inferior/lateral walls, and apex. LGE is greater than 50% transmural suggesting nonviability in apical anterior/septal/inferior/lateral walls and apex. LGE <50% transmural in mid anterior wall but wall thickness <4.16m suggests nonviability. Findings are consistent with nonviability in mid to distal LAD territory 2. Severe LV dilatation with severe systolic dysfunction (EF 253%. Thinning/akinesis of mid to apical anterior/anteroseptal walls and apical inferior/lateral walls. Aneurysm of apex 3.  LV apical thrombus measures 184mx 7m56m.  Normal RV size and systolic function (EF 48%97%. Diffuse pulmonary opacities and bilateral pleural effusions (moderate on right, small on left), consider CT chest for further Evaluation.   Echo in 06/2022  1. 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  Left ventricular ejection  fraction, by estimation, is 30-35%. The left  ventricle  is abnormal. The left  ventricle has focal regional wall motion abnormalities. The left  ventricular internal cavity size was mildly dilated. There is akinesis of  the left ventricular, apical septal wall, inferior wall, anterior wall and  lateral wall. There is akinesis of the  left ventricular, entire apical segment. There is severe hypokinesis of  the left ventricular, basal-mid anterior wall. A falst tendon is present  in the mid LV cavity.   Cath: 07/22/22     Prox LAD to Mid LAD lesion is 100% stenosed.   Dist Cx lesion is 80% stenosed.   A drug-eluting stent was successfully placed using a SYNERGY XD 3.0X20.   Post intervention, there is a 0% residual stenosis.   Successful PCI of the mid LCx with DES x 1     Plan: DAPT with ASA for one month, plavix for 12 months. OK to resume Coumadin tomorrow if no bleeding complications.     Diagnostic Dominance: Left  Intervention       Patient Profile     71 y.o. male with a PMHx of nonobstructive CAD 2011, AAA s/p repair 2013, HTN, HLD, T2DM, RCC s/p partial left nephrectomy 2015, OSA, GERD, RLS. who is being seen 07/18/2022 for the evaluation of acute on chronic HFrEF, NSTEMI and known CAD at the request of the primary hospitalist service.  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 Nephrology following HD yesterday   Chronic systolic heart failure Ischemic cardiomyopathy LV thrombus Hypertension  - echo 06/12/22 with LVEF 30-35% and evidence of LV thrombus, WMA with akinesis of the anterior wall - cMRI confirmed thrombus - GDMT complicated by SBO - regimen may include coreg 6.25 mg BID, hydralazine 25 mg BID, isordil 20 mg BID - no ACEI/ARB/ARNI/SGLT2i given renal function - BP remains marginal with ST - consider transitioning coreg to toprol for more BP room to control HR   Coumadin  therapy INR supratherapeutic now s/p Vit K with INR 1.4 Coumadin restarted   INR today 1.7 3 mg coumadin ordered by pharmacy  Caution given GI bleed   CAD  s/p DES-LCX, CTO of LAD Hyperlipidemia with LDL goal < 70 cMRI 10/13 showed no viabile myocardium Per Dr. Kipp Brood, not a candidate for CABG Given this, he proceeded with DES-LCX 07/22/22 Triple therapy with ASA, plavix, and coumadin x 30 days, then discontinued ASA 06/15/2022: Cholesterol 113; HDL 26; LDL Cholesterol 60; Triglycerides 137; VLDL 27 LP (a) 131 Medications as above: 40 mg lipitor, BB, isordil, and hydralazine   Toe discoloration Toes dusky on left foot, May have had embolic event but palpable pedal pulse  Back on anticoagulation    Bilateral leg pain Worse with exertion and when hypoxic Reassuring LE dopplers 07/11/22   Anemia GI bleed SBO Per primary Appreciate input from palliative care team  DNR    Cardiology follow up has been arranged.      For questions or updates, please contact [place] Please consult [email or web address] for contact info under        Signed, Jenkins Rouge, MD  07/31/2022, 10:02 AM

## 2022-08-01 DIAGNOSIS — R627 Adult failure to thrive: Secondary | ICD-10-CM

## 2022-08-01 DIAGNOSIS — N186 End stage renal disease: Secondary | ICD-10-CM | POA: Diagnosis not present

## 2022-08-01 DIAGNOSIS — I5023 Acute on chronic systolic (congestive) heart failure: Secondary | ICD-10-CM | POA: Diagnosis not present

## 2022-08-01 DIAGNOSIS — N170 Acute kidney failure with tubular necrosis: Secondary | ICD-10-CM | POA: Diagnosis not present

## 2022-08-01 DIAGNOSIS — Z992 Dependence on renal dialysis: Secondary | ICD-10-CM

## 2022-08-01 DIAGNOSIS — J189 Pneumonia, unspecified organism: Secondary | ICD-10-CM | POA: Diagnosis not present

## 2022-08-01 DIAGNOSIS — I513 Intracardiac thrombosis, not elsewhere classified: Secondary | ICD-10-CM | POA: Diagnosis not present

## 2022-08-01 DIAGNOSIS — I255 Ischemic cardiomyopathy: Secondary | ICD-10-CM | POA: Diagnosis not present

## 2022-08-01 DIAGNOSIS — Z515 Encounter for palliative care: Secondary | ICD-10-CM

## 2022-08-01 DIAGNOSIS — I25119 Atherosclerotic heart disease of native coronary artery with unspecified angina pectoris: Secondary | ICD-10-CM | POA: Diagnosis not present

## 2022-08-01 LAB — CBC
HCT: 24.4 % — ABNORMAL LOW (ref 39.0–52.0)
Hemoglobin: 8.4 g/dL — ABNORMAL LOW (ref 13.0–17.0)
MCH: 30.5 pg (ref 26.0–34.0)
MCHC: 34.4 g/dL (ref 30.0–36.0)
MCV: 88.7 fL (ref 80.0–100.0)
Platelets: 141 10*3/uL — ABNORMAL LOW (ref 150–400)
RBC: 2.75 MIL/uL — ABNORMAL LOW (ref 4.22–5.81)
RDW: 20.9 % — ABNORMAL HIGH (ref 11.5–15.5)
WBC: 11.9 10*3/uL — ABNORMAL HIGH (ref 4.0–10.5)
nRBC: 0 % (ref 0.0–0.2)

## 2022-08-01 LAB — BASIC METABOLIC PANEL
Anion gap: 17 — ABNORMAL HIGH (ref 5–15)
BUN: 71 mg/dL — ABNORMAL HIGH (ref 8–23)
CO2: 22 mmol/L (ref 22–32)
Calcium: 7.1 mg/dL — ABNORMAL LOW (ref 8.9–10.3)
Chloride: 97 mmol/L — ABNORMAL LOW (ref 98–111)
Creatinine, Ser: 5.36 mg/dL — ABNORMAL HIGH (ref 0.61–1.24)
GFR, Estimated: 11 mL/min — ABNORMAL LOW (ref >60)
Glucose, Bld: 132 mg/dL — ABNORMAL HIGH (ref 70–99)
Potassium: 4.4 mmol/L (ref 3.5–5.1)
Sodium: 136 mmol/L (ref 135–145)

## 2022-08-01 LAB — GLUCOSE, CAPILLARY
Glucose-Capillary: 124 mg/dL — ABNORMAL HIGH (ref 70–99)
Glucose-Capillary: 132 mg/dL — ABNORMAL HIGH (ref 70–99)
Glucose-Capillary: 114 mg/dL — ABNORMAL HIGH (ref 70–99)

## 2022-08-01 LAB — PROTIME-INR
INR: 3.1 — ABNORMAL HIGH (ref 0.8–1.2)
Prothrombin Time: 31.5 seconds — ABNORMAL HIGH (ref 11.4–15.2)

## 2022-08-01 MED ORDER — POLYVINYL ALCOHOL 1.4 % OP SOLN: 1.0000 [drp] | Freq: Four times a day (QID) | OPHTHALMIC | Status: DC | PRN

## 2022-08-01 MED ORDER — OXYCODONE HCL 20 MG/ML PO CONC: 5.0000 mg | ORAL | Status: DC | PRN

## 2022-08-01 MED ORDER — LORAZEPAM 1 MG PO TABS: 1.0000 mg | ORAL_TABLET | ORAL | Status: DC | PRN

## 2022-08-01 MED ORDER — GLYCOPYRROLATE 0.2 MG/ML IJ SOLN: 0.2000 mg | INTRAMUSCULAR | Status: DC | PRN

## 2022-08-01 MED ORDER — GLYCOPYRROLATE 1 MG PO TABS: 1.0000 mg | ORAL_TABLET | ORAL | Status: DC | PRN

## 2022-08-01 MED ORDER — LORAZEPAM 2 MG/ML IJ SOLN: 1.0000 mg | INTRAMUSCULAR | Status: DC | PRN

## 2022-08-01 MED ORDER — HALOPERIDOL LACTATE 2 MG/ML PO CONC
0.5000 mg | ORAL | Status: DC | PRN
  Filled 2022-08-01: qty 5

## 2022-08-01 MED ORDER — HYDROMORPHONE HCL 1 MG/ML IJ SOLN
0.2500 mg | Freq: Four times a day (QID) | INTRAMUSCULAR | Status: DC
  Administered 2022-08-01 – 2022-08-02 (×4): 0.25 mg via INTRAVENOUS
  Filled 2022-08-01 (×4): qty 1

## 2022-08-01 MED ORDER — HALOPERIDOL 0.5 MG PO TABS: 0.5000 mg | ORAL_TABLET | ORAL | Status: DC | PRN

## 2022-08-01 MED ORDER — HALOPERIDOL LACTATE 5 MG/ML IJ SOLN: 0.5000 mg | INTRAMUSCULAR | Status: DC | PRN

## 2022-08-01 MED ORDER — LORAZEPAM 2 MG/ML PO CONC
1.0000 mg | ORAL | Status: DC | PRN
  Filled 2022-08-01: qty 0.5

## 2022-08-01 MED ORDER — HYDROMORPHONE HCL 1 MG/ML IJ SOLN
0.5000 mg | INTRAMUSCULAR | Status: DC | PRN
  Administered 2022-08-02: 0.5 mg via INTRAVENOUS
  Filled 2022-08-01: qty 1

## 2022-08-01 NOTE — Progress Notes (Signed)
PROGRESS NOTE    Allen Cortez  KDX:[id number] DOB: 09/06/1951 DOA: 07/18/2022 PCP: Gerome Sam, MD  71 yo male with the past medical history of CAD, systolic heart failure, LV thrombus, right renal cancer sp partial left nephrectomy, T2DM, ESRD -just started HD 2 months ago with ongoing FTT, and chronic hypoxemic respiratory failure who presented with dyspnea. At home he had progressive dyspnea and orthopnea despite hemodialysis.  Wife reports ongoing decline since starting dialysis in August. -In the ED he was noted to be volume overloaded, chest x-ray noted bilateral interstitial edema, pleural effusions 10/17 Patient had urgent HD with ultrafiltration  10/19 HD. 10/20 Proximal to mid LAD is 100% stenosed, distal circumflex 80% stenosed underwent successful PCI of mid circumflex with DES X1, this was followed by Coffee ground emesis and abdominal pain post procedure. 1 unit PRBC transfusion.  10/21 GI consulted with recommendations of IV pantoprazole, high risk for endoscopic procedure.  10/22 PRBC transfusion due to low hgb below 8.  10/23 anticoagulation resumed 10/24 tolerated HD, GI signed off 10/24-25: Increasing abdominal pain, vomiting and distention, CT overnight concerning for SBO 10/25: General surgery consulted, high risk, not surgical candidate and high risk for NG tube with upper GI bleed and anticoagulation with DAPT 10/26 onwards w/ confusion, also mentioned SI upon questioning by RN 10/27: seen by Psych-Signed off, not felt to be suicidal, Palliative consulting 10/28: Noted to be more rhonchorous, with congested cough, chest x-ray concerning for aspiration pneumonia-Unasyn started 10/29-10/30 improving mental status, some ischemic changes in 2 toes  Subjective: -Feels fair overall, some cough, denies any dyspnea, abdominal discomfort is better, no nausea or vomiting  Assessment and Plan:  Acute on chronic systolic CHF -Echo with drop in EF to 30-35%,  akinesis of LV apical segment wall, inferior wall, antral anterior wall and lateral wall, severe hypokinesis of left ventricle basal mid anterior wall, preserved RV systolic function -Volume managed with HD, HD today -Continue carvedilol, hydralazine and Imdur  LV thrombus -Restarted on warfarin, INR was supratherapeutic at 4.5, few days ago w/ GI bleeding, received vitamin K, then down to 1.4, warfarin resumed 10/26, INR now 3.1 -Extremely complicated scenario with recent upper GI bleeding, LV thrombus and high risk of thromboembolic event off anticoagulation  Upper GI bleed Acute blood loss anemia -Prior reports of coffee-ground emesis and melena -Hemoglobin chronically low, worsened this admission -Transfused 3 units of PRBC this admission -Treated with IV PPI, gastroenterology was consulted, felt to be high risk for endoscopic evaluation -Unfortunately needs dual antiplatelet therapy in the setting of new stent and warfarin for LV thrombus,  -Hemoglobin stable today  Abdominal pain SBO -10/24 evening developed increasing abdominal pain with vomiting, CT concerning for small bowel obstruction with transition in the right lower quadrant, history of prior abdominal surgeries, could be secondary to ideations -High risk scenario, held off on NG tube with recent upper GI bleed,  -Appreciate general surgery evaluation, options are limited, repeat KUB with contrast in the colon, surgery has signed off -Advanced to soft diet  Aspiration pneumonia -Started antibiotics 10/28, continue Unasyn for 5 to 7 days -SLP eval, pulmonary toilet -Wean O2 as tolerated  CAD with angina Elevated troponin Underwent left heart cath, proximal LAD to mid LAD was 100% stenosed, distal circumflex with 80% stenosis, successful PCI, stenting with DES to mid circumflex, recommendations to continue warfarin, aspirin for 1 month and Plavix for 6 months -Continue aspirin/Plavix/carvedilol statin -Then complicated by  GI bleeding and followed by SBO  Confusion/delirium -  in setting of above issues and some withdrawal w/ SBO and missing klonopin and seroquel -resumed seroquel, klonopin and celexa  Acute kidney injury with acute tubular necrosis (ATN) ([place]) ESRD on HD. -Started hemodialysis in August, followed by ongoing decline -Admitted with volume overload, volume managed with HD, UF limited by cramping  Acute on chronic hypoxemic respiratory failure due to acute pulmonary edema -Wean O2 as tolerated  Type 2 diabetes mellitus with hyperlipidemia (HCC) -CBGs are stable, hold insulin therapy, oral intake is poor  GERD (gastroesophageal reflux disease) Continue PPI  PAD (peripheral artery disease) (HCC) Ischemic changes Continue with statin therapy.  -Mild duskiness of 2 toes of left foot, could have had an embolic event in the setting of LV thrombus, has palpable dorsalis pedis pulses, back on warfarin-INR therapeutic at 3.1, has palliative care meeting today, await to see discussion before pursuing vascular eval, poor candidate regardless  Severe protein calorie malnutrition -Albumin is 1.9, no history of liver disease, supplements as tolerated  Depression -Mentioned feeling depressed to nurse and then when asked further questions he said yes to suicidal thoughts -Then had sitter at bedside, psych consulted, appreciate psych eval, not felt to be suicidal  Goals of care: Extensive list of medical problems in 70/M who started dialysis in August and has been subsequently declining, now admitted with worsening cardiomyopathy, LV thrombus, CAD requiring PCI and stenting on dual antiplatelet therapy and warfarin complicated by Upper GI bleed, worsening anemia, followed by SBO and now aspiration pneumonia  -Now has some ischemic changes in 2 toes of left foot -Prognosis appears poor, severely malnourished his albumin is 1.9 -Palliative consulted for goals of care, meeting again today, remains DNR and  high risk of decompensation  DVT prophylaxis: warfarin Code Status: DNR Family Communication: No family at bedside, updated wife yesterday Disposition Plan: TBD, poor prognosis  Consultants: Nephrology, cardiology, palliative care, general surgery   Procedures: Cardiac MRI on 07/15/22 IMPRESSION: 1. Subendocardial LGE consistent with prior infarct in mid anterior/anteroseptal, apical anterior/septal/inferior/lateral walls, and apex. LGE is greater than 50% transmural suggesting nonviability in apical anterior/septal/inferior/lateral walls and apex. LGE <50% transmural in mid anterior wall but wall thickness <4.42m suggests nonviability. Findings are consistent with nonviability in mid to distal LAD territory 2. Severe LV dilatation with severe systolic dysfunction (EF 238%. Thinning/akinesis of mid to apical anterior/anteroseptal walls and apical inferior/lateral walls. Aneurysm of apex 3.  LV apical thrombus measures 124mx 32m92m.  Normal RV size and systolic function (EF 48%25%. Diffuse pulmonary opacities and bilateral pleural effusions (moderate on right, small on left), consider CT chest for further Evaluation.   Echo in 06/2022  1. 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  Left ventricular ejection fraction, by estimation, is 30-35%. The left  ventricle is abnormal. The left  ventricle has focal regional wall motion abnormalities. The left  ventricular internal cavity size was mildly dilated. There is akinesis of  the left ventricular, apical septal wall, inferior wall, anterior wall and  lateral wall. There is akinesis of the  left ventricular, entire apical segment. There is severe hypokinesis of  the left ventricular, basal-mid anterior wall. A falst tendon is present  in the mid LV cavity.   Cath: 07/22/22     Prox LAD to Mid LAD lesion is 100% stenosed.   Dist Cx  lesion is 80% stenosed.   A drug-eluting stent was  successfully placed using a SYNERGY XD 3.0X20.   Post intervention, there is a 0% residual stenosis.   Successful PCI of the mid LCx with DES x 1     Plan: DAPT with ASA for one month, plavix for 12 months. OK to resume Coumadin tomorrow if no bleeding complications.     Antimicrobials:    Objective: Vitals:   07/31/22 1652 07/31/22 2011 07/31/22 2205 08/01/22 0449  BP: 108/74 112/81 110/78 122/89  Pulse: 99 100  100  Resp: '20 18  20  '$ Temp: 98.3 F (36.8 C) 99 F (37.2 C)  97.8 F (36.6 C)  TempSrc: Oral Oral  Oral  SpO2: 98% 99%  95%  Weight:      Height:        Intake/Output Summary (Last 24 hours) at 08/01/2022 1156 Last data filed at 08/01/2022 0010 Gross per 24 hour  Intake 340.06 ml  Output --  Net 340.06 ml     Filed Weights   07/28/22 1722 07/30/22 0822 07/30/22 1321  Weight: 59.4 kg 61.5 kg 60.3 kg    Examination:  General exam: Frail chronically ill male appears much older than stated age, oriented to self and place, more lucid today, no distress HEENT: No JVD CVS: S1-S2, regular rhythm Lungs: Bilateral scattered rhonchi Abdomen: Soft, less distended, mildly tender diffusely, positive bowel sounds  Extremities: Some duskiness noted on left foot toes, no edema Skin: As above Psychiatry: Flat affect    Data Reviewed:   CBC: Recent Labs  Lab 07/29/22 0346 07/30/22 0137 07/30/22 0726 07/31/22 0131 08/01/22 0137  WBC 15.5* 12.3* 11.3* 9.8 11.9*  HGB 8.1* 7.8* 8.0* 8.0* 8.4*  HCT 24.7* 22.4* 23.7* 23.8* 24.4*  MCV 91.1 89.2 90.8 89.8 88.7  PLT 204 172 172 146* 732*   Basic Metabolic Panel: Recent Labs  Lab 07/26/22 0244 07/27/22 0456 07/28/22 0642 07/29/22 0346 07/30/22 0726 07/31/22 0131 08/01/22 0137  NA 132*   < > 137 135 135 138 136  K 5.5*   < > 5.4* 4.5 5.0 4.4 4.4  CL 91*   < > 91* 95* 95* 98 97*  CO2 23   < > 22 24 21* 25 22  GLUCOSE 107*   < > 131* 96 98 87 132*  BUN  78*   < > 92* 46* 83* 46* 71*  CREATININE 7.20*   < > 6.73* 4.10* 5.85* 3.87* 5.36*  CALCIUM 8.1*   < > 6.8* 7.2* 7.1* 7.2* 7.1*  PHOS 6.0*  --   --   --  8.2*  --   --    < > = values in this interval not displayed.   GFR: Estimated Creatinine Clearance: 10.6 mL/min (A) (by C-G formula based on SCr of 5.36 mg/dL (H)). Liver Function Tests: Recent Labs  Lab 07/26/22 0244 07/27/22 0456 07/30/22 0726  AST  --  24  --   ALT  --  14  --   ALKPHOS  --  66  --   BILITOT  --  1.3*  --   PROT  --  5.6*  --   ALBUMIN 2.1* 1.9* 1.8*   No results for input(s): "LIPASE", "AMYLASE" in the last 168 hours. No results for input(s): "AMMONIA" in the last 168 hours. Coagulation Profile: Recent Labs  Lab 07/28/22 0305 07/29/22 0346 07/30/22 0137 07/31/22 0131 08/01/22 0137  INR 1.4* 1.3* 1.4* 1.7* 3.1*   Cardiac Enzymes: No results for input(s): "CKTOTAL", "CKMB", "CKMBINDEX", "TROPONINI" in the last 168  hours. BNP (last 3 results) No results for input(s): "PROBNP" in the last 8760 hours. HbA1C: No results for input(s): "HGBA1C" in the last 72 hours. CBG: Recent Labs  Lab 07/31/22 1308 07/31/22 1659 07/31/22 2159 07/31/22 2231 08/01/22 0800  GLUCAP 119* 131* 140* 211* 124*   Lipid Profile: No results for input(s): "CHOL", "HDL", "LDLCALC", "TRIG", "CHOLHDL", "LDLDIRECT" in the last 72 hours. Thyroid Function Tests: No results for input(s): "TSH", "T4TOTAL", "FREET4", "T3FREE", "THYROIDAB" in the last 72 hours. Anemia Panel: No results for input(s): "VITAMINB12", "FOLATE", "FERRITIN", "TIBC", "IRON", "RETICCTPCT" in the last 72 hours. Urine analysis:    Component Value Date/Time   COLORURINE YELLOW 06/12/2022 1720   APPEARANCEUR CLEAR 06/12/2022 1720   LABSPEC 1.010 06/12/2022 1720   PHURINE 5.0 06/12/2022 1720   GLUCOSEU NEGATIVE 06/12/2022 1720   HGBUR SMALL (A) 06/12/2022 1720   BILIRUBINUR NEGATIVE 06/12/2022 1720   KETONESUR NEGATIVE 06/12/2022 1720   PROTEINUR 30  (A) 06/12/2022 1720   UROBILINOGEN 0.2 12/23/2009 0857   NITRITE NEGATIVE 06/12/2022 1720   LEUKOCYTESUR NEGATIVE 06/12/2022 1720   Sepsis Labs: '@LABRCNTIP'$ (procalcitonin:4,lacticidven:4)  ) No results found for this or any previous visit (from the past 240 hour(s)).    Radiology Studies: DG CHEST PORT 1 VIEW  Result Date: 07/30/2022 CLINICAL DATA:  Hypoxia EXAM: PORTABLE CHEST 1 VIEW COMPARISON:  July 26, 2022 FINDINGS: Stable cardiomegaly. The hila and mediastinum are unremarkable. A right central line is stable. No pneumothorax. The left lung is clear. Diffuse interstitial opacity throughout the right lung with more focal opacity in the right perihilar region. IMPRESSION: 1. Diffuse increased interstitial opacity in the right lung with more focal opacity in the right perihilar region. The more focal region of opacity is concerning for pneumonia or aspiration. Asymmetric edema is considered less likely. Recommend clinical correlation. Electronically Signed   By: Dorise Bullion III M.D.   On: 07/30/2022 14:46     Scheduled Meds:  atorvastatin  40 mg Oral QHS   bisacodyl  10 mg Oral Q1200   calcium acetate  667 mg Oral TID WC   carvedilol  6.25 mg Oral BID WC   Chlorhexidine Gluconate Cloth  6 each Topical Q0600   citalopram  20 mg Oral QHS   clonazePAM  0.5 mg Oral QHS   clopidogrel  75 mg Oral Q breakfast   darbepoetin (ARANESP) injection - DIALYSIS  150 mcg Subcutaneous Q Sat-1800   docusate sodium  100 mg Oral BID   feeding supplement (NEPRO CARB STEADY)  237 mL Oral TID BM   guaiFENesin  600 mg Oral BID   hydrALAZINE  25 mg Oral BID   insulin aspart  0-6 Units Subcutaneous TID AC & HS   isosorbide dinitrate  20 mg Oral BID   multivitamin  1 tablet Oral QHS   pantoprazole (PROTONIX) IV  40 mg Intravenous Q12H   polyethylene glycol  17 g Oral BID   QUEtiapine  300 mg Oral QHS   sodium chloride flush  3 mL Intravenous Q12H   Warfarin - Pharmacist Dosing Inpatient   Does  not apply q1600   Continuous Infusions:  sodium chloride     ampicillin-sulbactam (UNASYN) IV Stopped (07/31/22 1932)   promethazine (PHENERGAN) injection (IM or IVPB) Stopped (07/23/22 1026)     LOS: 14 days    Time spent: 73mn  PDomenic Polite MD [facility]   08/01/2022, 11:56 AM

## 2022-08-01 NOTE — TOC Progression Note (Signed)
Transition of Care [facility]) - Progression Note    Patient Details  Name: Allen Cortez MRN: [id number] Date of Birth: 1951-04-06  Transition of Care [facility]) CM/SW Contact  Graves-Bigelow, Ocie Cornfield, RN Phone Number: 08/01/2022, 4:07 PM  Clinical Narrative:  Case Manager received a secure chat regarding home with hospice services. Case Manager spoke with family regarding disposition needs. Medicare.gov list provided and the family has chosen [facility] needs: are bedside commode, diapers, and bed pads. Referral submitted to [facility]. Family has asked Case Manager to call [facility] to see if they can assist with arranging the [facility]. Case Manager did call Joseph Art @ [phone] ext [phone]- Case Manager left voicemail and hopefully will get a return call in the morning. Case Manager will submit clinicals to [facility] PCP [place] once the patient transitions home. Case Manager will continue to follow for additional transition of care needs.    Expected Discharge Plan: Home w Hospice Care Barriers to Discharge: No Barriers Identified  Expected Discharge Plan and Services Expected Discharge Plan: [place] In-house Referral: Hospice / Palliative Care Discharge Planning Services: CM Consult Post Acute Care Choice: Hospice Living arrangements for the past 2 months: Single Family Home                 DME Arranged: Walker rolling with seat DME Agency: [employer] Date DME Agency Contacted: 07/22/22 Time DME Agency Contacted: [id number] Representative spoke with at DME Agency: parachute HH Arranged: RN [place] Agency: Hospice and [place] [facility].) Date [place]: 08/01/22 Time [place]: [place] Representative spoke with at [place]: [place]   Readmission Risk Interventions    07/19/2022   12:30 PM 06/24/2022    3:23 PM  Readmission Risk Prevention  Plan  Transportation Screening Complete Complete  PCP or Specialist Appt within 3-5 Days  Complete  HRI or [place]  Complete  Social Work Consult for [place] Planning/Counseling  Complete  Palliative Care Screening  Not Applicable  Medication Review Press photographer) Complete Complete  HRI or [place] Complete   SW Recovery Care/Counseling Consult Complete   [place] Not Applicable

## 2022-08-01 NOTE — Progress Notes (Addendum)
[place] for Coumadin Indication: LV thrombus  Allergies  Allergen Reactions   Dilaudid [Hydromorphone Hcl] Other (See Comments)    hallucinations   Mirapex [Pramipexole Dihydrochloride] Other (See Comments)    Leg pain   Prednisone Other (See Comments)    irritability   Gabapentin     Other reaction(s): Tremor    Patient Measurements: Height: '5\' 10"'$  (177.8 cm) Weight: 60.3 kg (132 lb 15 oz) IBW/kg (Calculated) : 73  Vital Signs: Temp: 97.8 F (36.6 C) (10/30 0449) Temp Source: Oral (10/30 0449) BP: 122/89 (10/30 0449) Pulse Rate: 100 (10/30 0449)  Labs: Recent Labs    07/30/22 0137 07/30/22 0726 07/31/22 0131 08/01/22 0137  HGB 7.8* 8.0* 8.0* 8.4*  HCT 22.4* 23.7* 23.8* 24.4*  PLT 172 172 146* 141*  LABPROT 16.9*  --  19.9* 31.5*  INR 1.4*  --  1.7* 3.1*  CREATININE  --  5.85* 3.87* 5.36*     Estimated Creatinine Clearance: 10.6 mL/min (A) (by C-G formula based on SCr of 5.36 mg/dL (H)).   Medical History: Past Medical History:  Diagnosis Date   AAA (abdominal aortic aneurysm) (HCC)    Abdominal wall mass of left lower quadrant    Basal cell carcinoma    CAD (coronary artery disease) 2011   moderate   Colon polyp    DDD (degenerative disc disease)    Depression    Diabetes mellitus type II    no meds   GERD (gastroesophageal reflux disease)    Hyperlipidemia    Neuromuscular disorder (HCC)    Osteopenia    PAD (peripheral artery disease) (HCC)    Renal cell carcinoma    Restless leg syndrome    Sleep apnea    used a cpap yr ago-does not snore or use it now.   Assessment: 71 yo male on warfarin PTA for recent LV thrombus. He is also noted with ESRD and s/p emergent HD. S/p PCI 10/20, heparin held d/t 1 episode of coffee-ground emesis, no reports of bleeding since. Pharmacy consulted to bridge heparin to warfarin.   Heparin stopped due to elevated INR and concern for GIB. Vit K given 10/25. INR jumped from  1.7 to 3.1 today. Hgb stable at 8.4, plt 141. Minimal oral intake documented.   PTA regimen 2.5 mg daily except 5 mg Mon  Goal of Therapy:  INR 2-3 Monitor platelets by anticoagulation protocol: Yes   Plan:  Hold warfarin tonight  Continue plavix Monitor daily INR and signs/symptoms of bleeding  Antonietta Jewel, PharmD, BCCCP Clinical Pharmacist  Phone: [phone] 08/01/2022 11:08 AM  Please check AMION for all [place] phone numbers After 10:00 PM, call [place] [phone]

## 2022-08-01 NOTE — Progress Notes (Signed)
[facility] Progress Note   Subjective: Seen in room. C/O pain in R foot, otherwise seems OK. Palliative Care Meeting today.   Objective Vitals:   07/31/22 1652 07/31/22 2011 07/31/22 2205 08/01/22 0449  BP: 108/74 112/81 110/78 122/89  Pulse: 99 100  100  Resp: '20 18  20  '$ Temp: 98.3 F (36.8 C) 99 F (37.2 C)  97.8 F (36.6 C)  TempSrc: Oral Oral  Oral  SpO2: 98% 99%  95%  Weight:      Height:       Physical Exam General: Chronically ill patient in NAD Heart: S1,S2 RRR SR on monitor Lungs: CTAB, no SOB Abdomen: Flat, NT Extremities: No LE edema Dialysis Access: RIJ TDC Drsg intact.   Additional Objective Labs: Basic Metabolic Panel: Recent Labs  Lab 07/26/22 0244 07/27/22 0456 07/30/22 0726 07/31/22 0131 08/01/22 0137  NA 132*   < > 135 138 136  K 5.5*   < > 5.0 4.4 4.4  CL 91*   < > 95* 98 97*  CO2 23   < > 21* 25 22  GLUCOSE 107*   < > 98 87 132*  BUN 78*   < > 83* 46* 71*  CREATININE 7.20*   < > 5.85* 3.87* 5.36*  CALCIUM 8.1*   < > 7.1* 7.2* 7.1*  PHOS 6.0*  --  8.2*  --   --    < > = values in this interval not displayed.   Liver Function Tests: Recent Labs  Lab 07/26/22 0244 07/27/22 0456 07/30/22 0726  AST  --  24  --   ALT  --  14  --   ALKPHOS  --  66  --   BILITOT  --  1.3*  --   PROT  --  5.6*  --   ALBUMIN 2.1* 1.9* 1.8*   No results for input(s): "LIPASE", "AMYLASE" in the last 168 hours. CBC: Recent Labs  Lab 07/29/22 0346 07/30/22 0137 07/30/22 0726 07/31/22 0131 08/01/22 0137  WBC 15.5* 12.3* 11.3* 9.8 11.9*  HGB 8.1* 7.8* 8.0* 8.0* 8.4*  HCT 24.7* 22.4* 23.7* 23.8* 24.4*  MCV 91.1 89.2 90.8 89.8 88.7  PLT 204 172 172 146* 141*   Blood Culture No results found for: "SDES", "SPECREQUEST", "CULT", "REPTSTATUS"  Cardiac Enzymes: No results for input(s): "CKTOTAL", "CKMB", "CKMBINDEX", "TROPONINI" in the last 168 hours. CBG: Recent Labs  Lab 07/31/22 1308 07/31/22 1659 07/31/22 2159 07/31/22 2231  08/01/22 0800  GLUCAP 119* 131* 140* 211* 124*   Iron Studies: No results for input(s): "IRON", "TIBC", "TRANSFERRIN", "FERRITIN" in the last 72 hours. '@lablastinr3'$ @ Studies/Results: DG CHEST PORT 1 VIEW  Result Date: 07/30/2022 CLINICAL DATA:  Hypoxia EXAM: PORTABLE CHEST 1 VIEW COMPARISON:  July 26, 2022 FINDINGS: Stable cardiomegaly. The hila and mediastinum are unremarkable. A right central line is stable. No pneumothorax. The left lung is clear. Diffuse interstitial opacity throughout the right lung with more focal opacity in the right perihilar region. IMPRESSION: 1. Diffuse increased interstitial opacity in the right lung with more focal opacity in the right perihilar region. The more focal region of opacity is concerning for pneumonia or aspiration. Asymmetric edema is considered less likely. Recommend clinical correlation. Electronically Signed   By: Dorise Bullion III M.D.   On: 07/30/2022 14:46   Medications:  sodium chloride     ampicillin-sulbactam (UNASYN) IV Stopped (07/31/22 1932)   promethazine (PHENERGAN) injection (IM or IVPB) Stopped (07/23/22 1026)    atorvastatin  40  mg Oral QHS   bisacodyl  10 mg Oral Q1200   calcium acetate  667 mg Oral TID WC   carvedilol  6.25 mg Oral BID WC   Chlorhexidine Gluconate Cloth  6 each Topical Q0600   citalopram  20 mg Oral QHS   clonazePAM  0.5 mg Oral QHS   clopidogrel  75 mg Oral Q breakfast   darbepoetin (ARANESP) injection - DIALYSIS  150 mcg Subcutaneous Q Sat-1800   docusate sodium  100 mg Oral BID   feeding supplement (NEPRO CARB STEADY)  237 mL Oral TID BM   guaiFENesin  600 mg Oral BID   hydrALAZINE  25 mg Oral BID   insulin aspart  0-6 Units Subcutaneous TID AC & HS   isosorbide dinitrate  20 mg Oral BID   multivitamin  1 tablet Oral QHS   pantoprazole (PROTONIX) IV  40 mg Intravenous Q12H   polyethylene glycol  17 g Oral BID   QUEtiapine  300 mg Oral QHS   sodium chloride flush  3 mL Intravenous Q12H   Warfarin  - Pharmacist Dosing Inpatient   Does not apply q1600     Dialysis Orders: AKI  [place] TTS 4h 400/A1.5x EDW 70kg 3K/2.25Ca TDC  - Heparin 2000 Units IV TIW    Assessment/Plan: Acute hypoxic resp failure - likely related to volume overload. CXR w/CHF and b/l pleural effusions.  Improved, now under dry weight.  Repeat CXR 10/23 with persistent cardiomegaly and pulmonary edema. Probable layering pleural fluid.  UF limited by cramping/hypotension.  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 Continue HD TTS. [place]. Hold off writing orders until [facility] meeting.  SBO - identified on CT 10/24.  Surgery consulted and patient is not good candidate for surgery or NG tube placement.  On bowel rest.  HTN - BP ok. Coreg 6.'25mg'$  BID, hydralazine '25mg'$  BID, and isordil '20mg'$  BID.  d/c home lasix d/t minimal to no UOP. Now well below OP dry weight. Minimal UF. Lower EDW on discharge if patient continues HD.  CAD -CTS consulted - no plans for surgery at this time, high risk.  s/p DES to circ on 10/20. Needs triple AC therapy - ASA, plavix and coumadin x 1 month, then just plavix, coumadin. Per GI ok with ASA, plavix therapy continued as long as no overt bleeding, hold coumadin if ongoing drop in Hgb, ok to restart heparin if Hgb stable.  Per cardiology A/C systolic HF - echo with EF 30-35%.  Meds limited by kidney function.  Volume removal with HD.  Anemia-  Hgb 8.4  s/p 3 units prbcs since admission. Tsat 19%, getting iron load.  ESA 150 qSat. Bones-  CorrCa ok. Hyperphosphatemia --resume binders when eating.  Nutrition - On liquid diet . Very low albumin. Ate protein supplements when eating.  SI - Hx PTSD. Psych consult, sitter  [place] - DNR. Palliative care consulted. Plans for family meeting Monday. Becoming more evident that he may no longer be suitable for outpatient dialysis  Toyna Erisman H. Deserae Jennings NP-C 08/01/2022, 11:14 AM  [employer] [phone]

## 2022-08-01 NOTE — Progress Notes (Signed)
Daily Progress Note   Patient Name: Allen Cortez       Date: 08/01/2022 DOB: August 19, 1951  Age: 71 y.o. MRN#: [id number] Attending Physician: Domenic Polite, MD Primary Care Physician: Gerome Sam, MD Admit Date: 07/18/2022  Reason for Consultation/Follow-up: Establishing goals of care  Patient Profile/HPI: 71 y.o. male  with past medical history PTSD treated with seroquel, AKI on CKD3 d/t contrast from recent cardiac cath started on HD with hopes of restoring kidney function but has been HD dependent, HTN, combined systolic and diastolic heart failure with recent cardiac MRI indicating no viable myocardium, LV thrombus, renal cell carcinoma s/p partial nephrectomy in 2016, PAD, DM admitted on 07/18/2022 with increasing SOB. Chest xray indicated interstitial markings with small bilateral pleural effusions. Has had emergent diuresis and underwent PCI intervention with drug (Plavix) eluding stent placed. This lead to development of GI bleeding. Subsequently developed SBO and deemed not to be a surgical candidate and could not have NG tube placed. He has now developed delirium and altered mental status possibly related to withdrawal from Seroquel and clonidine which is compounded by his acute illnesses and hospitalization. Palliative medicine consulted for goals of care.      Subjective: Met with patient, his son, daughter in law, and spouse at the bedside.  He was sleepy but able to participate in [place] discussion.  He shared that "he's tired". He acknowledges he has gone downhill since he started dialysis.  We discussed options for continued medical management vs transition to comfort measures and hospice.  Discussed transition to comfort measures only which includes stopping IV  fluids, antibiotics, labs and providing symptom management for SOB, anxiety, nausea, vomiting, and other symptoms of dying.   Tommy very easily and clearly stated his desire to focus on comfort. He doesn't want to continue dialysis or other life prolonging medications or interventions.  His family was very supportive and grateful that he was able to have his mental status restored so that he could share his wishes.  They are hopeful they can take him home with hospice support.    Review of Systems  Respiratory:  Positive for shortness of breath.   Cardiovascular:  Positive for chest pain.     Physical Exam Vitals and nursing note reviewed.  Constitutional:      Appearance: He is ill-appearing.  Pulmonary:     Comments: Increased effort Musculoskeletal:     Comments: Generalized weakness  Neurological:     General: No focal deficit present.             Vital Signs: BP 122/89 (BP Location: Right Arm)   Pulse 100   Temp 97.8 F (36.6 C) (Oral)   Resp 20   Ht _0  (1.778 m)   Wt 60.3 kg   SpO2 95%   BMI 19.07 kg/m  SpO2: SpO2: 95 % O2 Device: O2 Device: Nasal Cannula O2 Flow Rate: O2 Flow Rate (L/min): 2 L/min  Intake/output summary:  Intake/Output Summary (Last 24 hours) at 08/01/2022 1459 Last data filed at 08/01/2022 0010 Gross per 24 hour  Intake 340.06 ml  Output --  Net 340.06 ml   LBM: Last BM Date : 07/30/22 Baseline Weight: Weight: 68.6 kg Most recent weight: Weight: 60.3 kg       Palliative Assessment/Data: PPS: 20%      Patient Active Problem List   Diagnosis Date Noted   Adjustment disorder with depressed mood 07/29/2022   PTSD (post-traumatic stress disorder) 07/29/2022   Small bowel obstruction (HCC)    Upper GI bleed 07/23/2022   Elevated troponin I level    Acute on chronic systolic congestive heart failure ([place]) 07/18/2022   Chronic respiratory failure with hypoxia ([place]) 07/18/2022   Anemia 07/18/2022   Primary hypercoagulable state  ([place]) 06/29/2022   Long term (current) use of anticoagulants 06/29/2022   Myocardial infarction due to demand ischemia [facility])    Acute respiratory failure with hypoxia ([place]) 06/18/2022   Aphasia    Dysarthria    Ischemic cardiomyopathy: EF 24% by C MRI    Left ventricular apical thrombus    Acute kidney injury (AKI) with acute tubular necrosis (ATN) ([place]) 06/12/2022   Type 2 diabetes mellitus with hyperlipidemia ([place]) 06/12/2022   Coronary artery disease involving native coronary artery of native heart with angina pectoris ([place]): 100% mLAD, 80% m(AVG)LCx 05/31/2022   MDD (major depressive disorder), recurrent episode, severe ([place]) 04/09/2014   Tobacco use disorder 07/15/2013   GERD (gastroesophageal reflux disease)    Depression    Basal cell carcinoma    DDD (degenerative disc disease)    Colon polyp    Restless leg syndrome    Osteopenia    PAD (peripheral artery disease) (HCC)    Neuromuscular disorder (HCC)    Sleep apnea    AAA (abdominal aortic aneurysm) ([place]) - s/p Repair (2014)    Abdominal wall mass of left lower quadrant 01/24/2012    Palliative Care Assessment & Plan    Assessment/Recommendations/Plan  Transition to full comfort measures Hydromorphone .40m IV q6hr Hydromorphone .559mIV q30 min prn for pain or SOB Lorazepam 24m73mo, SL or IV q4 hr anxiety Haldol .5mg28m, SL or IV q4 hr PRN agitation Glycopyrrolate .2mg 14mq4hr secretions  On discharge, would recommend scripts for: - Oxycodone liquid Concentrate 20mg/6m 5mg (072mml) s16mngual every 1 hour as needed for pain or shortness of breath: Disp 30ml - L64mepam 2mg/ml co46mntrated solution: 24mg (0.5ml30mublin23ml every 4 hours as needed for anxiety: Disp 30ml - Haldo18mg/ml soluti624m 0.5mg (0.25ml) [id number] 4 hours as needed for agitation or nausea: Disp 30ml     Code St2m: DNR  Prognosis:  < 2 weeks  Discharge Planning: To Be Determined  Care plan was discussed with patient, family  and care team.   Thank you for allowing the Palliative Medicine  Team to assist in the care of this patient.  Total time:  80 minutes  Greater than 50%  of this time was spent counseling and coordinating care related to the above assessment and plan.  Mariana Kaufman, AGNP-C Palliative Medicine   Please contact Palliative Medicine Team phone at [phone] for questions and concerns.

## 2022-08-01 NOTE — Progress Notes (Addendum)
Rounding Note    Patient Name: Allen Cortez Date of Encounter: 08/01/2022  [place] Cardiologist: Shelva Majestic, MD   Subjective   No chest pain, no SOB, + Lt second toe pain  Inpatient Medications    Scheduled Meds:  atorvastatin  40 mg Oral QHS   bisacodyl  10 mg Oral Q1200   calcium acetate  667 mg Oral TID WC   carvedilol  6.25 mg Oral BID WC   Chlorhexidine Gluconate Cloth  6 each Topical Q0600   citalopram  20 mg Oral QHS   clonazePAM  0.5 mg Oral QHS   clopidogrel  75 mg Oral Q breakfast   darbepoetin (ARANESP) injection - DIALYSIS  150 mcg Subcutaneous Q Sat-1800   docusate sodium  100 mg Oral BID   feeding supplement (NEPRO CARB STEADY)  237 mL Oral TID BM   guaiFENesin  600 mg Oral BID   hydrALAZINE  25 mg Oral BID   insulin aspart  0-6 Units Subcutaneous TID AC & HS   isosorbide dinitrate  20 mg Oral BID   multivitamin  1 tablet Oral QHS   pantoprazole (PROTONIX) IV  40 mg Intravenous Q12H   polyethylene glycol  17 g Oral BID   QUEtiapine  300 mg Oral QHS   sodium chloride flush  3 mL Intravenous Q12H   Warfarin - Pharmacist Dosing Inpatient   Does not apply q1600   Continuous Infusions:  sodium chloride     ampicillin-sulbactam (UNASYN) IV Stopped (07/31/22 1932)   promethazine (PHENERGAN) injection (IM or IVPB) Stopped (07/23/22 1026)   PRN Meds: sodium chloride, acetaminophen **OR** acetaminophen, albuterol, HYDROmorphone (DILAUDID) injection, ondansetron (ZOFRAN) IV, mouth rinse, promethazine (PHENERGAN) injection (IM or IVPB), sodium chloride flush   Vital Signs    Vitals:   07/31/22 1652 07/31/22 2011 07/31/22 2205 08/01/22 0449  BP: 108/74 112/81 110/78 122/89  Pulse: 99 100  100  Resp: '20 18  20  '$ Temp: 98.3 F (36.8 C) 99 F (37.2 C)  97.8 F (36.6 C)  TempSrc: Oral Oral  Oral  SpO2: 98% 99%  95%  Weight:      Height:        Intake/Output Summary (Last 24 hours) at 08/01/2022 0906 Last data filed at 08/01/2022  0010 Gross per 24 hour  Intake 400.06 ml  Output --  Net 400.06 ml      07/30/2022    1:21 PM 07/30/2022    8:22 AM 07/28/2022    5:22 PM  Last 3 Weights  Weight (lbs) 132 lb 15 oz 135 lb 9.3 oz 130 lb 15.3 oz  Weight (kg) 60.3 kg 61.5 kg 59.4 kg      Telemetry    SR - Personally Reviewed  ECG    No new  - Personally Reviewed  Physical Exam   GEN: No acute distress.   Neck: No JVD Cardiac: RRR, no murmurs, rubs, or gallops.  Respiratory: Clear to auscultation bilaterally. GI: Soft, nontender, non-distended  MS: No edema; No deformity. Left second toe cooler than others painful to touch slower capillary refill Neuro:  Nonfocal  Psych: Normal affect   Labs    High Sensitivity Troponin:   Recent Labs  Lab 07/18/22 1653 07/18/22 1845 07/18/22 2045 07/19/22 0454  TROPONINIHS 266* 271* 256* 288*     Chemistry Recent Labs  Lab 07/26/22 0244 07/27/22 0456 07/28/22 0642 07/30/22 0726 07/31/22 0131 08/01/22 0137  NA 132* 136   < > 135 138 136  K 5.5* 4.9   < > 5.0 4.4 4.4  CL 91* 93*   < > 95* 98 97*  CO2 23 23   < > 21* 25 22  GLUCOSE 107* 112*   < > 98 87 132*  BUN 78* 52*   < > 83* 46* 71*  CREATININE 7.20* 5.11*   < > 5.85* 3.87* 5.36*  CALCIUM 8.1* 7.8*   < > 7.1* 7.2* 7.1*  PROT  --  5.6*  --   --   --   --   ALBUMIN 2.1* 1.9*  --  1.8*  --   --   AST  --  24  --   --   --   --   ALT  --  14  --   --   --   --   ALKPHOS  --  66  --   --   --   --   BILITOT  --  1.3*  --   --   --   --   GFRNONAA 7* 11*   < > 10* 16* 11*  ANIONGAP 18* 20*   < > 19* 15 17*   < > = values in this interval not displayed.    Lipids No results for input(s): "CHOL", "TRIG", "HDL", "LABVLDL", "LDLCALC", "CHOLHDL" in the last 168 hours.  Hematology Recent Labs  Lab 07/30/22 0726 07/31/22 0131 08/01/22 0137  WBC 11.3* 9.8 11.9*  RBC 2.61* 2.65* 2.75*  HGB 8.0* 8.0* 8.4*  HCT 23.7* 23.8* 24.4*  MCV 90.8 89.8 88.7  MCH 30.7 30.2 30.5  MCHC 33.8 33.6 34.4  RDW  20.7* 20.6* 20.9*  PLT 172 146* 141*   Thyroid No results for input(s): "TSH", "FREET4" in the last 168 hours.  BNPNo results for input(s): "BNP", "PROBNP" in the last 168 hours.  DDimer No results for input(s): "DDIMER" in the last 168 hours.   Radiology    DG CHEST PORT 1 VIEW  Result Date: 07/30/2022 CLINICAL DATA:  Hypoxia EXAM: PORTABLE CHEST 1 VIEW COMPARISON:  July 26, 2022 FINDINGS: Stable cardiomegaly. The hila and mediastinum are unremarkable. A right central line is stable. No pneumothorax. The left lung is clear. Diffuse interstitial opacity throughout the right lung with more focal opacity in the right perihilar region. IMPRESSION: 1. Diffuse increased interstitial opacity in the right lung with more focal opacity in the right perihilar region. The more focal region of opacity is concerning for pneumonia or aspiration. Asymmetric edema is considered less likely. Recommend clinical correlation. Electronically Signed   By: Dorise Bullion III M.D.   On: 07/30/2022 14:46    Cardiac Studies   Cardiac MRI on 07/15/22 IMPRESSION: 1. Subendocardial LGE consistent with prior infarct in mid anterior/anteroseptal, apical anterior/septal/inferior/lateral walls, and apex. LGE is greater than 50% transmural suggesting nonviability in apical anterior/septal/inferior/lateral walls and apex. LGE <50% transmural in mid anterior wall but wall thickness <4.3m suggests nonviability. Findings are consistent with nonviability in mid to distal LAD territory 2. Severe LV dilatation with severe systolic dysfunction (EF 235%. Thinning/akinesis of mid to apical anterior/anteroseptal walls and apical inferior/lateral walls. Aneurysm of apex 3.  LV apical thrombus measures 112mx 5m94m.  Normal RV size and systolic function (EF 48%70%. Diffuse pulmonary opacities and bilateral pleural effusions (moderate on right, small on left), consider CT chest for further Evaluation.   Echo in 06/2022  1.  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  Left ventricular ejection fraction, by estimation, is 30-35%. The left  ventricle is abnormal. The left  ventricle has focal regional wall motion abnormalities. The left  ventricular internal cavity size was mildly dilated. There is akinesis of  the left ventricular, apical septal wall, inferior wall, anterior wall and  lateral wall. There is akinesis of the  left ventricular, entire apical segment. There is severe hypokinesis of  the left ventricular, basal-mid anterior wall. A falst tendon is present  in the mid LV cavity.   Cath: 07/22/22     Prox LAD to Mid LAD lesion is 100% stenosed.   Dist Cx lesion is 80% stenosed.   A drug-eluting stent was successfully placed using a SYNERGY XD 3.0X20.   Post intervention, there is a 0% residual stenosis.   Successful PCI of the mid LCx with DES x 1     Plan: DAPT with ASA for one month, plavix for 12 months. OK to resume Coumadin tomorrow if no bleeding complications.     Diagnostic Dominance: Left  Intervention       Patient Profile     71 y.o. male with a PMHx of nonobstructive CAD 2011, AAA s/p repair 2013, HTN, HLD, T2DM, RCC s/p partial left nephrectomy 2015, OSA, GERD, RLS. who is being seen 07/18/2022 for the evaluation of acute on chronic HFrEF, NSTEMI and known CAD at the request of the primary hospitalist service.  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 Nephrology following HD yesterday     Chronic systolic heart failure Ischemic cardiomyopathy LV thrombus Hypertension  - echo 06/12/22 with LVEF 30-35% and evidence of LV thrombus, WMA with akinesis of the anterior wall - cMRI confirmed thrombus - GDMT complicated by SBO - regimen may include coreg 6.25 mg BID, hydralazine 25 mg BID, isordil 20 mg  BID - no ACEI/ARB/ARNI/SGLT2i given renal function - BP remains marginal with ST - consider transitioning coreg to toprol for more BP room to control HR     Coumadin therapy INR supratherapeutic now s/p Vit K with INR 1.4 Coumadin restarted   INR yesterday 1.7,  3 mg coumadin ordered by pharmacy  INR today up to 3.1 after 3 mg po  Caution given GI bleed     CAD  s/p DES-LCX, CTO of LAD Hyperlipidemia with LDL goal < 70 cMRI 10/13 showed no viabile myocardium Per Dr. Kipp Brood, not a candidate for CABG Given this, he proceeded with DES-LCX 07/22/22 Triple therapy with ASA, plavix, and coumadin x 30 days, then discontinued ASA  (no longer on ASA 07/26/22 was last dose since pt had coffee ground emesis) 06/15/2022: Cholesterol 113; HDL 26; LDL Cholesterol 60; Triglycerides 137; VLDL 27 LP (a) 131 Medications as above: 40 mg lipitor, BB, isordil, and hydralazine     Toe discoloration Toes dusky on left foot, May have had embolic event but palpable pedal pulse  Today with second Lt toe pain and cooler to touch compared to other toes.  + painful.   Normal ABIs on the 9th but with LV thrombus and embolic to small vessels.   Will need vascular to see.  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

## 2022-08-02 ENCOUNTER — Inpatient Hospital Stay (HOSPITAL_COMMUNITY): Payer: No Typology Code available for payment source

## 2022-08-02 DIAGNOSIS — I255 Ischemic cardiomyopathy: Secondary | ICD-10-CM | POA: Diagnosis not present

## 2022-08-02 DIAGNOSIS — I5023 Acute on chronic systolic (congestive) heart failure: Secondary | ICD-10-CM | POA: Diagnosis not present

## 2022-08-02 DIAGNOSIS — N170 Acute kidney failure with tubular necrosis: Secondary | ICD-10-CM | POA: Diagnosis not present

## 2022-08-02 DIAGNOSIS — I25119 Atherosclerotic heart disease of native coronary artery with unspecified angina pectoris: Secondary | ICD-10-CM | POA: Diagnosis not present

## 2022-08-02 DIAGNOSIS — F332 Major depressive disorder, recurrent severe without psychotic features: Secondary | ICD-10-CM | POA: Diagnosis not present

## 2022-08-02 HISTORY — PX: IR REMOVAL TUN CV CATH W/O FL: IMG2289

## 2022-08-02 MED ORDER — OXYCODONE HCL 20 MG/ML PO CONC: 5.0000 mg | ORAL | 0 refills | Status: AC | PRN

## 2022-08-02 MED ORDER — LORAZEPAM 1 MG PO TABS: 1.0000 mg | ORAL_TABLET | ORAL | 0 refills | Status: DC | PRN

## 2022-08-02 MED ORDER — CHLORHEXIDINE GLUCONATE 4 % EX LIQD
CUTANEOUS | Status: AC
  Filled 2022-08-02: qty 15

## 2022-08-02 MED ORDER — LORAZEPAM 1 MG PO TABS: 1.0000 mg | ORAL_TABLET | ORAL | 0 refills | Status: AC | PRN

## 2022-08-02 MED ORDER — OXYCODONE HCL 20 MG/ML PO CONC: 5.0000 mg | ORAL | 0 refills | Status: DC | PRN

## 2022-08-02 MED ORDER — LIDOCAINE HCL 1 % IJ SOLN
INTRAMUSCULAR | Status: AC
  Administered 2022-08-02: 10 mL
  Filled 2022-08-02: qty 20

## 2022-08-02 NOTE — TOC Transition Note (Addendum)
Transition of Care [facility]) - CM/SW Discharge Note   Patient Details  Name: Allen Cortez MRN: [id number] Date of Birth: 10-Jun-1951  [facility] CM/SW Contact:  Bethena Roys, RN Phone Number: 08/02/2022, 11:00 AM   Clinical Narrative: Case Manager received notification from the [facility] regarding personal care aides. [facility] called [facility] and they should be able to service the patient by Monday. Patient will transition home today via ambulance transport. DME HB, Oxygen, and wheelchair in the home. Spouse is on the way to hospital now. Case Manager will call for PTAR transport.   1203 08-02-22 Case Manager called for PTAR and ETA is between 2-3 pm. Staff and family is aware. No further needs identified at this time.   Final next level of care: Home w Hospice Care Barriers to Discharge: No Barriers Identified   Patient Goals and CMS Choice Patient states their goals for this hospitalization and ongoing recovery are:: [place] has decided to go home with Hospice. CMS Medicare.gov Compare Post Acute Care list provided to:: Patient Choice offered to / list presented to : Patient, Spouse  Discharge Plan and Services In-house Referral: Hospice / Palliative Care Discharge Planning Services: CM Consult Post Acute Care Choice: Hospice          DME Arranged: Walker rolling with seat DME Agency: [employer] Date DME Agency Contacted: 07/22/22 Time DME Agency Contacted: [id number] Representative spoke with at DME Agency: parachute HH Arranged: RN [place] Agency: Hospice and [facility] [facility].) Date [place]: 08/01/22 Time [place]: [place] Representative spoke with at [place]: [place] Readmission Risk Interventions    07/19/2022   12:30 PM 06/24/2022    3:23 PM  Readmission Risk Prevention Plan  Transportation Screening Complete Complete  PCP or Specialist Appt within 3-5 Days  Complete  HRI or [place]  Complete  Social Work Consult for [place] Planning/Counseling  Complete  Palliative Care Screening  Not Applicable  Medication Review Press photographer) Complete Complete  HRI or [place] Complete   SW Recovery Care/Counseling Consult Complete   [place] Not Applicable

## 2022-08-02 NOTE — Progress Notes (Signed)
Rounding Note    Patient Name: Allen Cortez Date of Encounter: 08/02/2022  [place] Cardiologist: Shelva Majestic, MD   Subjective   Having abd pain, his pain meds are not helping much.  No recent BM per pt  Inpatient Medications    Scheduled Meds:  bisacodyl  10 mg Oral Q1200   carvedilol  6.25 mg Oral BID WC   Chlorhexidine Gluconate Cloth  6 each Topical Q0600   citalopram  20 mg Oral QHS   clonazePAM  0.5 mg Oral QHS   clopidogrel  75 mg Oral Q breakfast   docusate sodium  100 mg Oral BID   feeding supplement (NEPRO CARB STEADY)  237 mL Oral TID BM   guaiFENesin  600 mg Oral BID   hydrALAZINE  25 mg Oral BID    HYDROmorphone (DILAUDID) injection  0.25 mg Intravenous Q6H   isosorbide dinitrate  20 mg Oral BID   pantoprazole (PROTONIX) IV  40 mg Intravenous Q12H   polyethylene glycol  17 g Oral BID   QUEtiapine  300 mg Oral QHS   Continuous Infusions:  promethazine (PHENERGAN) injection (IM or IVPB) Stopped (07/23/22 1026)   PRN Meds: acetaminophen **OR** acetaminophen, albuterol, glycopyrrolate **OR** glycopyrrolate **OR** glycopyrrolate, haloperidol **OR** haloperidol **OR** haloperidol lactate, HYDROmorphone (DILAUDID) injection, LORazepam **OR** LORazepam **OR** LORazepam, ondansetron (ZOFRAN) IV, mouth rinse, oxyCODONE **OR** oxyCODONE, polyvinyl alcohol, promethazine (PHENERGAN) injection (IM or IVPB)   Vital Signs    Vitals:   07/31/22 2205 08/01/22 0449 08/01/22 2016 08/01/22 2100  BP: 110/78 122/89 109/84   Pulse:  100 99 98  Resp:  20  18  Temp:  97.8 F (36.6 C) 98.3 F (36.8 C) 97.7 F (36.5 C)  TempSrc:  Oral Oral Oral  SpO2:  95% 92% 94%  Weight:      Height:        Intake/Output Summary (Last 24 hours) at 08/02/2022 0810 Last data filed at 08/01/2022 2109 Gross per 24 hour  Intake 240 ml  Output --  Net 240 ml      07/30/2022    1:21 PM 07/30/2022    8:22 AM 07/28/2022    5:22 PM  Last 3 Weights  Weight (lbs) 132  lb 15 oz 135 lb 9.3 oz 130 lb 15.3 oz  Weight (kg) 60.3 kg 61.5 kg 59.4 kg      Telemetry    SR - Personally Reviewed  ECG    No new - Personally Reviewed  Physical Exam   GEN: No acute distress.   Neck: No JVD Cardiac: RRR, no murmurs, rubs, or gallops.  Respiratory: Clear to auscultation bilaterally.  Ant position GI: Soft, nontender, non-distended  MS: No edema; No deformity.  Dusky toes on left Neuro:  Nonfocal  Psych: Normal affect   Labs    High Sensitivity Troponin:   Recent Labs  Lab 07/18/22 1653 07/18/22 1845 07/18/22 2045 07/19/22 0454  TROPONINIHS 266* 271* 256* 288*     Chemistry Recent Labs  Lab 07/27/22 0456 07/28/22 0642 07/30/22 0726 07/31/22 0131 08/01/22 0137  NA 136   < > 135 138 136  K 4.9   < > 5.0 4.4 4.4  CL 93*   < > 95* 98 97*  CO2 23   < > 21* 25 22  GLUCOSE 112*   < > 98 87 132*  BUN 52*   < > 83* 46* 71*  CREATININE 5.11*   < > 5.85* 3.87* 5.36*  CALCIUM 7.8*   < >  7.1* 7.2* 7.1*  PROT 5.6*  --   --   --   --   ALBUMIN 1.9*  --  1.8*  --   --   AST 24  --   --   --   --   ALT 14  --   --   --   --   ALKPHOS 66  --   --   --   --   BILITOT 1.3*  --   --   --   --   GFRNONAA 11*   < > 10* 16* 11*  ANIONGAP 20*   < > 19* 15 17*   < > = values in this interval not displayed.    Lipids No results for input(s): "CHOL", "TRIG", "HDL", "LABVLDL", "LDLCALC", "CHOLHDL" in the last 168 hours.  Hematology Recent Labs  Lab 07/30/22 0726 07/31/22 0131 08/01/22 0137  WBC 11.3* 9.8 11.9*  RBC 2.61* 2.65* 2.75*  HGB 8.0* 8.0* 8.4*  HCT 23.7* 23.8* 24.4*  MCV 90.8 89.8 88.7  MCH 30.7 30.2 30.5  MCHC 33.8 33.6 34.4  RDW 20.7* 20.6* 20.9*  PLT 172 146* 141*   Thyroid No results for input(s): "TSH", "FREET4" in the last 168 hours.  BNPNo results for input(s): "BNP", "PROBNP" in the last 168 hours.  DDimer No results for input(s): "DDIMER" in the last 168 hours.   Radiology    No results found.  Cardiac Studies   Cardiac  MRI on 07/15/22 IMPRESSION: 1. Subendocardial LGE consistent with prior infarct in mid anterior/anteroseptal, apical anterior/septal/inferior/lateral walls, and apex. LGE is greater than 50% transmural suggesting nonviability in apical anterior/septal/inferior/lateral walls and apex. LGE <50% transmural in mid anterior wall but wall thickness <4.22m suggests nonviability. Findings are consistent with nonviability in mid to distal LAD territory 2. Severe LV dilatation with severe systolic dysfunction (EF 269%. Thinning/akinesis of mid to apical anterior/anteroseptal walls and apical inferior/lateral walls. Aneurysm of apex 3.  LV apical thrombus measures 172mx 39m79m.  Normal RV size and systolic function (EF 48%45%. Diffuse pulmonary opacities and bilateral pleural effusions (moderate on right, small on left), consider CT chest for further Evaluation.   Echo in 06/2022  1. 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  Left ventricular ejection fraction, by estimation, is 30-35%. The left  ventricle is abnormal. The left  ventricle has focal regional wall motion abnormalities. The left  ventricular internal cavity size was mildly dilated. There is akinesis of  the left ventricular, apical septal wall, inferior wall, anterior wall and  lateral wall. There is akinesis of the  left ventricular, entire apical segment. There is severe hypokinesis of  the left ventricular, basal-mid anterior wall. A falst tendon is present  in the mid LV cavity.   Cath: 07/22/22     Prox LAD to Mid LAD lesion is 100% stenosed.   Dist Cx lesion is 80% stenosed.   A drug-eluting stent was successfully placed using a SYNERGY XD 3.0X20.   Post intervention, there is a 0% residual stenosis.   Successful PCI of the mid LCx with DES x 1     Plan: DAPT with ASA for one month, plavix for 12 months. OK to resume  Coumadin tomorrow if no bleeding complications.     Diagnostic Dominance: Left  Intervention       Patient Profile     70 17o.  male with a PMHx of nonobstructive CAD 2011, AAA s/p repair 2013, HTN, HLD, T2DM, RCC s/p partial left nephrectomy 2015, OSA, GERD, RLS. who is being seen 07/18/2022 for the evaluation of acute on chronic HFrEF, NSTEMI and known CAD at the request of the primary hospitalist service.  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 Nephrology following HD has been stopped  Palliative Care has seen Pt is DNR with  comfort care     Chronic systolic heart failure Ischemic cardiomyopathy LV thrombus Hypertension  - echo 06/12/22 with LVEF 30-35% and evidence of LV thrombus, WMA with akinesis of the anterior wall - cMRI confirmed thrombus - GDMT complicated by SBO - regimen may include coreg 6.25 mg BID, hydralazine 25 mg BID, isordil 20 mg BID - no ACEI/ARB/ARNI/SGLT2i given renal function - BP remains marginal with ST - consider transitioning coreg to toprol for more BP room to control HR Coumadin stopped     Coumadin therapy INR supratherapeutic now s/p Vit K with INR 1.4 Coumadin restarted   INR yesterday 1.7,  3 mg coumadin ordered by pharmacy  INR today up to 3.1 after 3 mg po  Caution given GI bleed  coumadin stopped     CAD  s/p DES-LCX, CTO of LAD Hyperlipidemia with LDL goal < 70 cMRI 10/13 showed no viabile myocardium Per Dr. Kipp Brood, not a candidate for CABG Given this, he proceeded with DES-LCX 07/22/22 Triple therapy with ASA, plavix, and coumadin x 30 days, then discontinued ASA  (no longer on ASA 07/26/22 was last dose since pt had coffee ground emesis) 06/15/2022: Cholesterol 113; HDL 26; LDL Cholesterol 60; Triglycerides 137; VLDL 27 LP (a) 131 Medications as above: 40 mg lipitor, BB, isordil, and hydralazine     Toe discoloration Toes dusky on left foot, May  have had embolic event but palpable pedal pulse  Today with second Lt toe pain and cooler to touch compared to other toes.  + painful.   Normal ABIs on the 9th but with LV thrombus and embolic to small vessels.   at this time will hold off vascular pt is comfort care and on pain meds.  Anticoagulation has been stopped  -thought could be to use  eliquis and prevent lab draws though palliative care has discussed stopping anticoagulation       Bilateral leg pain Worse with exertion and when hypoxic Reassuring LE dopplers 07/11/22  Pt does not wish to continue dialysis, or other life prolonging medications or interventions.  Transitioning to full comfort measures   Warfarin stopped,   Would continue plavix to keep stent open        For questions or updates, please contact [place] Please consult [email or web address] for contact info under        Signed, Cecilie Kicks, NP  08/02/2022, 8:10 AM

## 2022-08-02 NOTE — Progress Notes (Signed)
Manufacturing engineer [facility]) Hospital Liaison Note  Referral received for patient/family interest in hospice at home. [place] liaison spoke with patient's wife [name] to confirm interest. Interest confirmed.   Hospice eligibility pending.   DME in the home: hospital bed, wheelchair and oxygen.   DME needs: AV nurse to assess on admission visit.   Plan is to discharge home today via ambulance.   Please send comfort medications/prescriptions with patient at discharge.   Please call with any questions or concerns. Thank you  Roselee Nova, [place] Hospital Liaison [phone]

## 2022-08-02 NOTE — Progress Notes (Signed)
Daily Progress Note   Patient Name: Allen Cortez       Date: 08/02/2022 DOB: 1951-08-21  Age: 71 y.o. MRN#: [id number] Attending Physician: Allen Polite, MD Primary Care Physician: Allen Sam, MD Admit Date: 07/18/2022  Reason for Consultation/Follow-up: Establishing goals of care  Patient Profile/HPI: 70 y.o. male  with past medical history PTSD treated with seroquel, AKI on CKD3 d/t contrast from recent cardiac cath started on HD with hopes of restoring kidney function but has been HD dependent, HTN, combined systolic and diastolic heart failure with recent cardiac MRI indicating no viable myocardium, LV thrombus, renal cell carcinoma s/p partial nephrectomy in 2016, PAD, DM admitted on 07/18/2022 with increasing SOB. Chest xray indicated interstitial markings with small bilateral pleural effusions. Has had emergent diuresis and underwent PCI intervention with drug (Plavix) eluding stent placed. This lead to development of GI bleeding. Subsequently developed SBO and deemed not to be a surgical candidate and could not have NG tube placed. He has now developed delirium and altered mental status possibly related to withdrawal from Seroquel and clonidine which is compounded by his acute illnesses and hospitalization. Palliative medicine consulted for goals of care.      Subjective: Allen Cortez is awake, but sleepy. Visiting with a friend. Requesting pain medication.    Review of Systems  Gastrointestinal:  Positive for abdominal pain.     Physical Exam Vitals and nursing note reviewed.  Constitutional:      Appearance: He is ill-appearing.  Pulmonary:     Comments: Increased effort Musculoskeletal:     Comments: Generalized weakness  Neurological:     General: No focal  deficit present.             Vital Signs: BP 124/89   Pulse 100   Temp 97.7 F (36.5 C) (Oral)   Resp 18   Ht '5\' 10"'$  (1.778 m)   Wt 60.3 kg   SpO2 97%   BMI 19.07 kg/m  SpO2: SpO2: 97 % O2 Device: O2 Device: Nasal Cannula O2 Flow Rate: O2 Flow Rate (L/min): 2 L/min  Intake/output summary:  Intake/Output Summary (Last 24 hours) at 08/02/2022 1103 Last data filed at 08/01/2022 2109 Gross per 24 hour  Intake 240 ml  Output --  Net 240 ml    LBM: Last BM Date : 07/30/22 Baseline  Weight: Weight: 68.6 kg Most recent weight: Weight: 60.3 kg       Palliative Assessment/Data: PPS: 20%      Patient Active Problem List   Diagnosis Date Noted   Adjustment disorder with depressed mood 07/29/2022   PTSD (post-traumatic stress disorder) 07/29/2022   Small bowel obstruction (HCC)    Upper GI bleed 07/23/2022   Elevated troponin I level    Acute on chronic systolic congestive heart failure ([place]) 07/18/2022   Chronic respiratory failure with hypoxia ([place]) 07/18/2022   Anemia 07/18/2022   Primary hypercoagulable state ([place]) 06/29/2022   Long term (current) use of anticoagulants 06/29/2022   Myocardial infarction due to demand ischemia [facility])    Acute respiratory failure with hypoxia ([place]) 06/18/2022   Aphasia    Dysarthria    Ischemic cardiomyopathy: EF 24% by C MRI    Left ventricular apical thrombus    Acute kidney injury (AKI) with acute tubular necrosis (ATN) ([place]) 06/12/2022   Type 2 diabetes mellitus with hyperlipidemia ([place]) 06/12/2022   Coronary artery disease involving native coronary artery of native heart with angina pectoris ([place]): 100% mLAD, 80% m(AVG)LCx 05/31/2022   MDD (major depressive disorder), recurrent episode, severe ([place]) 04/09/2014   Tobacco use disorder 07/15/2013   GERD (gastroesophageal reflux disease)    Depression    Basal cell carcinoma    DDD (degenerative disc disease)    Colon polyp    Restless leg syndrome    Osteopenia    PAD  (peripheral artery disease) ([place])    Neuromuscular disorder ([place])    Sleep apnea    AAA (abdominal aortic aneurysm) ([place]) - s/p Repair (2014)    Abdominal wall mass of left lower quadrant 01/24/2012    Palliative Care Assessment & Plan    Assessment/Recommendations/Plan  Continue comfort interventions Plan to d/c home today with hospice    Code Status: DNR  Prognosis:  < 2 weeks  Discharge Planning: To Be Determined  Care plan was discussed with patient, family and care team.   Thank you for allowing the Palliative Medicine Team to assist in the care of this patient.    Greater than 50%  of this time was spent counseling and coordinating care related to the above assessment and plan.  Allen Cortez, AGNP-C Palliative Medicine   Please contact Palliative Medicine Team phone at [phone] for questions and concerns.

## 2022-08-02 NOTE — Discharge Summary (Signed)
Physician Discharge Summary  Allen Cortez JSE:[id number] DOB: 12/08/1950 DOA: 07/18/2022  PCP: Gerome Sam, MD  Admit date: 07/18/2022 Discharge date: 08/02/2022  Time spent: 45 minutes  Recommendations for Outpatient Follow-up:  Home with hospice for comfort focused care   Discharge Diagnoses:  Active Problems:   Acute kidney injury (AKI) with acute tubular necrosis (ATN) (HCC)   Acute on chronic systolic congestive heart failure (HCC)   Coronary artery disease involving native coronary artery of native heart with angina pectoris ([place]): 100% mLAD, 80% m(AVG)LCx   Type 2 diabetes mellitus with hyperlipidemia (HCC)   GERD (gastroesophageal reflux disease)   Sleep apnea   Left ventricular apical thrombus   PAD (peripheral artery disease) (HCC)   Tobacco use disorder   Upper GI bleed   Restless leg syndrome   Neuromuscular disorder (HCC)   MDD (major depressive disorder), recurrent episode, severe ([place])   Ischemic cardiomyopathy: EF 24% by C MRI   Myocardial infarction due to demand ischemia ([place])   Long term (current) use of anticoagulants   Small bowel obstruction (HCC)   Adjustment disorder with depressed mood   PTSD (post-traumatic stress disorder) Delirium  Discharge Condition: Guarded   Diet recommendation: Comfort feeds  Filed Weights   07/28/22 1722 07/30/22 0822 07/30/22 1321  Weight: 59.4 kg 61.5 kg 60.3 kg    History of present illness:  71 yo male with the past medical history of CAD, systolic heart failure, LV thrombus, right renal cancer sp partial left nephrectomy, T2DM, ESRD -just started HD 2 months ago with ongoing FTT, and chronic hypoxemic respiratory failure who presented with dyspnea. At home he had progressive dyspnea and orthopnea despite hemodialysis.  Wife reports ongoing decline since starting dialysis in August. -In the ED he was noted to be volume overloaded, chest x-ray noted bilateral interstitial edema, pleural  effusions 10/17 Patient had urgent HD with ultrafiltration  10/19 HD. 10/20 Proximal to mid LAD is 100% stenosed, distal circumflex 80% stenosed underwent successful PCI of mid circumflex with DES X1, this was followed by Coffee ground emesis and abdominal pain post procedure. 1 unit PRBC transfusion.  10/21 GI consulted with recommendations of IV pantoprazole, high risk for endoscopic procedure.  10/22 PRBC transfusion due to low hgb below 8.  10/23 anticoagulation resumed 10/24 tolerated HD, GI signed off 10/24-25: Increasing abdominal pain, vomiting and distention, CT overnight concerning for SBO 10/25: General surgery consulted, high risk, not surgical candidate and high risk for NG tube with upper GI bleed and anticoagulation with DAPT 10/26 onwards w/ confusion, also mentioned SI upon questioning by RN 10/27: seen by Psych-Signed off, not felt to be suicidal, Palliative consulting 10/28: Noted to be more rhonchorous, with congested cough, chest x-ray concerning for aspiration pneumonia-Unasyn started 10/29-10/30 improving mental status, some ischemic changes in 2 toes  Hospital Course:   Acute on chronic systolic CHF -Echo with drop in EF to 30-35%, akinesis of LV apical segment wall, inferior wall, antral anterior wall and lateral wall, severe hypokinesis of left ventricle basal mid anterior wall, preserved RV systolic function -Volume managed with HD, -Status post palliative care meeting, now comfort care, discharged home with residential hospice   LV thrombus -Restarted on warfarin,  -Extremely complicated scenario with recent upper GI bleeding, LV thrombus and high risk of thromboembolic event off anticoagulation -Now comfort care   Upper GI bleed Acute blood loss anemia -Prior reports of coffee-ground emesis and melena -Hemoglobin chronically low, worsened this admission -Transfused 3 units of PRBC this  admission -Treated with IV PPI, gastroenterology was consulted, felt to  be high risk for endoscopic evaluation -Now comfort care   Abdominal pain SBO -10/24 evening developed increasing abdominal pain with vomiting, CT concerning for small bowel obstruction with transition in the right lower quadrant, history of prior abdominal surgeries, could be secondary to ideations -High risk scenario, held off on NG tube with recent upper GI bleed,  -Appreciate general surgery evaluation, options are limited, repeat KUB with contrast in the colon, surgery has signed off -Comfort care   Aspiration pneumonia -Started antibiotics 10/28,  -SLP eval, pulmonary toilet -Wean O2 as tolerated   CAD with angina Elevated troponin Underwent left heart cath, proximal LAD to mid LAD was 100% stenosed, distal circumflex with 80% stenosis, successful PCI, stenting with DES to mid circumflex, recommendations to continue warfarin, aspirin for 1 month and Plavix for 6 months -Treated with aspirin/Plavix/carvedilol statin -Then complicated by GI bleeding and followed by SBO   Confusion/delirium -in setting of above issues and some withdrawal w/ SBO and missing klonopin and seroquel -resumed seroquel, klonopin and celexa   Acute kidney injury with acute tubular necrosis (ATN) ([place]) ESRD on HD. -Started hemodialysis in August, followed by ongoing decline -Admitted with volume overload, volume managed with HD, UF limited by cramping -HD discontinued, now comfort care   Acute on chronic hypoxemic respiratory failure due to acute pulmonary edema -Wean O2 as tolerated   Type 2 diabetes mellitus    GERD (gastroesophageal reflux disease) Continue PPI   PAD (peripheral artery disease) ([place]) Ischemic changes Continue with statin therapy.  -Mild duskiness of 2 toes of left foot, could have had an embolic event in the setting of LV thrombus, has palpable dorsalis pedis pulses, back on warfarin-INR therapeutic   Severe protein calorie malnutrition -Albumin is 1.9, no history of liver  disease, supplements as tolerated   Depression -Mentioned feeling depressed to nurse and then when asked further questions he said yes to suicidal thoughts -Then had sitter at bedside, psych consulted, appreciate psych eval, not felt to be suicidal   Goals of care: Extensive list of medical problems in 70/M who started dialysis in August and has been subsequently declining, now admitted with worsening cardiomyopathy, LV thrombus, CAD requiring PCI and stenting on dual antiplatelet therapy and warfarin complicated by Upper GI bleed, worsening anemia, followed by SBO and now aspiration pneumonia  -Now has some ischemic changes in 2 toes of left foot -Prognosis felt to be poor, severely malnourished his albumin is 1.9 -Palliative consulted for goals of care, completed palliative care meeting yesterday, now comfort care, discharged home with hospice services   Procedures: Procedures: Cardiac MRI on 07/15/22 IMPRESSION: 1. Subendocardial LGE consistent with prior infarct in mid anterior/anteroseptal, apical anterior/septal/inferior/lateral walls, and apex. LGE is greater than 50% transmural suggesting nonviability in apical anterior/septal/inferior/lateral walls and apex. LGE <50% transmural in mid anterior wall but wall thickness <4.42m suggests nonviability. Findings are consistent with nonviability in mid to distal LAD territory 2. Severe LV dilatation with severe systolic dysfunction (EF 263%. Thinning/akinesis of mid to apical anterior/anteroseptal walls and apical inferior/lateral walls. Aneurysm of apex 3.  LV apical thrombus measures 114mx 49m12m.  Normal RV size and systolic function (EF 48%01%. Diffuse pulmonary opacities and bilateral pleural effusions (moderate on right, small on left), consider CT chest for further Evaluation.   Echo in 06/2022  1. There is a large apical aneurysm with a mobile lucency in the apex  measuring 1.07 x 1.72cm  consistent with LV thrombus by  definity contrast.  Left ventricular ejection fraction, by estimation, is 30-35%. The left  ventricle is abnormal. The left  ventricle has focal regional wall motion abnormalities. The left  ventricular internal cavity size was mildly dilated. There is akinesis of  the left ventricular, apical septal wall, inferior wall, anterior wall and  lateral wall. There is akinesis of the  left ventricular, entire apical segment. There is severe hypokinesis of  the left ventricular, basal-mid anterior wall. A falst tendon is present  in the mid LV cavity.   Cath: 07/22/22     Prox LAD to Mid LAD lesion is 100% stenosed.   Dist Cx lesion is 80% stenosed.   A drug-eluting stent was successfully placed using a SYNERGY XD 3.0X20.   Post intervention, there is a 0% residual stenosis.   Successful PCI of the mid LCx with DES x 1     Plan: DAPT with ASA for one month, plavix for 12 months. OK to resume Coumadin tomorrow if no bleeding complications.     Discharge Exam: Vitals:   08/01/22 2016 08/01/22 2100  BP: 109/84   Pulse: 99 98  Resp:  18  Temp: 98.3 F (36.8 C) 97.7 F (36.5 C)  SpO2: 92% 94%    Chronically ill male laying in bed, awake alert oriented to self and partly to place CVS: S1-S2, regular rhythm Lungs: Decreased breath sounds to bases, abdomen: Soft, less distended, mildly tender Extremities: No edema  Discharge Instructions   Discharge Instructions     Diet - low sodium heart healthy   Complete by: As directed    Diet - low sodium heart healthy   Complete by: As directed    Increase activity slowly   Complete by: As directed    Increase activity slowly   Complete by: As directed       Allergies as of 08/02/2022       Reactions   Dilaudid [hydromorphone Hcl] Other (See Comments)   hallucinations   Mirapex [pramipexole Dihydrochloride] Other (See Comments)   Leg pain   Prednisone Other (See Comments)   irritability   Gabapentin    Other reaction(s):  Tremor        Medication List     STOP taking these medications    aspirin EC 81 MG tablet   atorvastatin 40 MG tablet Commonly known as: LIPITOR   carvedilol 6.25 MG tablet Commonly known as: COREG   Dialyvite 800-Zinc 15 0.8 MG Tabs   feeding supplement Liqd   furosemide 40 MG tablet Commonly known as: LASIX   hydrALAZINE 25 MG tablet Commonly known as: APRESOLINE   isosorbide mononitrate 30 MG 24 hr tablet Commonly known as: IMDUR   lansoprazole 15 MG capsule Commonly known as: PREVACID   multivitamin Tabs tablet   Nepro Liqd   warfarin 5 MG tablet Commonly known as: COUMADIN       TAKE these medications    acetaminophen 325 MG tablet Commonly known as: TYLENOL Take 2 tablets (650 mg total) by mouth every 4 (four) hours as needed for headache or mild pain.   citalopram 40 MG tablet Commonly known as: CELEXA Take 40 mg by mouth every evening.   clonazePAM 0.5 MG tablet Commonly known as: KLONOPIN Take 0.5 mg by mouth at bedtime.   LORazepam 1 MG tablet Commonly known as: ATIVAN Take 1 tablet (1 mg total) by mouth every 4 (four) hours as needed for anxiety.   oxyCODONE 20  MG/ML concentrated solution Commonly known as: ROXICODONE INTENSOL Place 0.3 mLs (6 mg total) under the tongue every 4 (four) hours as needed for moderate pain (or dyspnea).   QUEtiapine 300 MG tablet Commonly known as: SEROQUEL Take 300 mg by mouth at bedtime.               Durable Medical Equipment  (From admission, onward)           Start     Ordered   07/22/22 0937  For home use only DME 4 wheeled rolling walker with seat  Once       Question:  Patient needs a walker to treat with the following condition  Answer:  Abnormal gait   07/22/22 0937           Allergies  Allergen Reactions   Dilaudid [Hydromorphone Hcl] Other (See Comments)    hallucinations   Mirapex [Pramipexole Dihydrochloride] Other (See Comments)    Leg pain   Prednisone Other  (See Comments)    irritability   Gabapentin     Other reaction(s): Tremor    Follow-up Information     [facility] Follow up.   Why: Rollator to be delivered to the home. Contact information: [place] [address] [phone]         [facility] Follow up.   Why: Hospice Services to call with visit times Contact information: [place] [address] [phone]                  The results of significant diagnostics from this hospitalization (including imaging, microbiology, ancillary and laboratory) are listed below for reference.    Significant Diagnostic Studies: DG CHEST PORT 1 VIEW  Result Date: 07/30/2022 CLINICAL DATA:  Hypoxia EXAM: PORTABLE CHEST 1 VIEW COMPARISON:  July 26, 2022 FINDINGS: Stable cardiomegaly. The hila and mediastinum are unremarkable. A right central line is stable. No pneumothorax. The left lung is clear. Diffuse interstitial opacity throughout the right lung with more focal opacity in the right perihilar region. IMPRESSION: 1. Diffuse increased interstitial opacity in the right lung with more focal opacity in the right perihilar region. The more focal region of opacity is concerning for pneumonia or aspiration. Asymmetric edema is considered less likely. Recommend clinical correlation. Electronically Signed   By: Dorise Bullion III M.D.   On: 07/30/2022 14:46   DG Abd Portable 1V  Result Date: 07/29/2022 CLINICAL DATA:  Small-bowel obstruction in a 71 year old male. EXAM: PORTABLE ABDOMEN - 1 VIEW COMPARISON:  July 28, 2022. FINDINGS: Central venous access device incidentally noted in the lower chest terminating in the RIGHT atrium. Visualized lung bases are clear. Persistent moderate to marked small bowel distension throughout the abdomen. Dilution or further passage of contrast media into the colon. Question slightly decreased dilation of small bowel loops in the RIGHT lower  quadrant. Signs of some gas and stool in the colon with similar distribution. Rectum not visualized. On limited assessment no acute skeletal findings. IMPRESSION: Signs of persistent presumed partial small bowel obstruction. Perhaps mildly diminished dilation of small bowel loops in the RIGHT lower quadrant, bowel loops in the LEFT hemiabdomen and central abdomen shows stable moderate to marked dilation. Electronically Signed   By: Zetta Bills M.D.   On: 07/29/2022 08:26   DG Abd Portable 1V-Small Bowel Obstruction Protocol-24 hr delay  Result Date: 07/28/2022 CLINICAL DATA:  Small-bowel obstruction. EXAM: PORTABLE ABDOMEN - 1 VIEW COMPARISON:  Abdominal radiographs dated 07/27/2022  and CT abdomen and pelvis dated 07/27/2022 FINDINGS: Enteric contrast appears to reach the hepatic flexure, suggestive of a partial small-bowel obstruction or resolving small bowel obstruction, however multiple dilated loops of small bowel are redemonstrated. IMPRESSION: Enteric contrast appears to reach the hepatic flexure, suggestive of a partial small-bowel obstruction or resolving small bowel obstruction given that multiple loops of small bowel remain dilated. Electronically Signed   By: Zerita Boers M.D.   On: 07/28/2022 13:52   DG Abd Portable 1V-Small Bowel Obstruction Protocol-initial, 8 hr delay  Result Date: 07/27/2022 CLINICAL DATA:  8 hour small-bowel follow-up film EXAM: PORTABLE ABDOMEN - 1 VIEW COMPARISON:  CT from earlier in the same day. FINDINGS: Scattered large and small bowel gas is noted. Contrast material administered previously now lies predominately within the small bowel which shows dilatation similar to that seen on the prior CT. No contrast is noted within the colon. IMPRESSION: Contrast administered lies within dilated small bowel loops. No colonic contrast is seen. 24 hour follow-up film is recommended. Electronically Signed   By: [name] M.D.   On: 07/27/2022 21:01   CT ABDOMEN PELVIS WO  CONTRAST  Result Date: 07/27/2022 CLINICAL DATA:  Abdominal pain. History of renal cell carcinoma status post partial left nephrectomy. EXAM: CT ABDOMEN AND PELVIS WITHOUT CONTRAST TECHNIQUE: Multidetector CT imaging of the abdomen and pelvis was performed following the standard protocol without IV contrast. RADIATION DOSE REDUCTION: This exam was performed according to the departmental dose-optimization program which includes automated exposure control, adjustment of the mA and/or kV according to patient size and/or use of iterative reconstruction technique. COMPARISON:  CT AP 03/22/2022 and 04/07/2017. FINDINGS: Lower chest: There is a small right pleural effusion. Peripheral airspace consolidation within the right lower lobe is identified. Ground-glass attenuation and interstitial thickening concerning for mild edema. Cardiac enlargement and aortic atherosclerosis. Hepatobiliary: Cyst within right lobe of liver is unchanged measuring 0.9 cm. Gallbladder negative. No signs of bile duct dilatation. Pancreas: Unremarkable. No pancreatic ductal dilatation or surrounding inflammatory changes. Spleen: Normal in size without focal abnormality. Adrenals/Urinary Tract: Normal adrenal glands. Bilateral, benign Bosniak class 1 and 2 kidney cysts measure up to 2.4 cm. No follow-up imaging recommended. Postoperative change identified along the lateral aspect of the left kidney compatible with prior partial nephrectomy. Calcification within the left retroperitoneum adjacent to the lower pole of left kidney compatible with chronic fat necrosis. Unchanged. No nephrolithiasis or signs of hydronephrosis. There is high attenuation material within the urinary bladder which may reflect recently administered gadolinium contrast agent. No focal bladder abnormality noted. Stomach/Bowel: Stomach appears normal. Multiple dilated loops of small bowel are identified along with small bowel air-fluid levels. The small bowel loops measure  up to 4.2 cm. Transition to decreased caliber distal small bowel noted in the right lower quadrant of the abdomen, image 68/4. Terminal ileum has a normal caliber. The appendix is visualized and appears normal. Normal appearance of the colon. Vascular/Lymphatic: Extensive aortic atherosclerosis. Infrarenal abdominal aortic aneurysm is identified measuring 3.4 cm and maximum AP diameter, image 34/4. No signs of abdominopelvic adenopathy. Reproductive: Prostate is unremarkable. Other: No free fluid or fluid collections. No signs of pneumoperitoneum. Musculoskeletal: No acute or significant osseous findings. IMPRESSION: 1. Examination is positive for small bowel obstruction with transition to decreased caliber distal small bowel in the right lower quadrant of the abdomen. 2. Small right pleural effusion with peripheral airspace consolidation within the right lower lobe. Correlate for signs of right lower lobe inflammation/infection 3. Infrarenal abdominal  aortic aneurysm measuring 3.4 cm and maximum AP diameter. Recommend follow-up every 3 years. Aortic aneurysm NOS (ICD10-I71.9). 4.  Aortic Atherosclerosis (ICD10-I70.0). These results will be called to the ordering clinician or representative by the Radiologist Assistant, and communication documented in the PACS or [employer]. Electronically Signed   By: Kerby Moors M.D.   On: 07/27/2022 05:46   DG Chest 1 View  Result Date: 07/26/2022 CLINICAL DATA:  Provided history: Dyspnea. EXAM: CHEST  1 VIEW COMPARISON:  Prior chest radiographs 07/25/2022 and earlier. FINDINGS: A portion of the left lateral costophrenic angle is excluded from the field of view. Unchanged position of a right-sided central venous catheter with tip terminating at the level of the upper right atrium. Cardiomegaly. Bilateral interstitial and ill-defined airspace opacities compatible with edema, similar to slightly decreased. Persistent small right pleural effusion. No evidence of  pneumothorax. IMPRESSION: A portion of the left costophrenic angle is excluded from the field of view. Pulmonary edema, similar to slightly decreased from the prior examination of 07/25/2022. Persistent small right pleural effusion. Cardiomegaly, unchanged. Electronically Signed   By: Kellie Simmering D.O.   On: 07/26/2022 16:34   DG CHEST PORT 1 VIEW  Result Date: 07/25/2022 CLINICAL DATA:  Shortness of breath EXAM: PORTABLE CHEST 1 VIEW COMPARISON:  07/18/2022 FINDINGS: Central line remains at the [facility] RA junction. Cardiomegaly persists. Pulmonary edema persists, similar to the previous study. Layering pleural fluid is probably present. No new finding. IMPRESSION: No significant change. Persistent cardiomegaly and pulmonary edema. Probable layering pleural fluid. Electronically Signed   By: Nelson Chimes M.D.   On: 07/25/2022 06:53   CARDIAC CATHETERIZATION  Result Date: 07/22/2022   Prox LAD to Mid LAD lesion is 100% stenosed.   Dist Cx lesion is 80% stenosed.   A drug-eluting stent was successfully placed using a SYNERGY XD 3.0X20.   Post intervention, there is a 0% residual stenosis. Successful PCI of the mid LCx with DES x 1 Plan: DAPT with ASA for one month, plavix for 12 months. OK to resume Coumadin tomorrow if no bleeding complications.   DG Chest 2 View  Result Date: 07/18/2022 CLINICAL DATA:  Shortness of breath. Bilateral leg swelling for several days. EXAM: CHEST - 2 VIEW COMPARISON:  07/08/2022 FINDINGS: Mildly enlarged cardiac silhouette with an interval increase in size. Interval increased prominence of the interstitial markings and increased patchy densities in both lungs. Small bilateral pleural effusions. Stable right jugular double-lumen catheter with its tip in the upper right atrium. Unremarkable bones. IMPRESSION: 1. Worsening changes of congestive heart failure. 2. Small bilateral pleural effusions. 3. Mild cardiomegaly. Electronically Signed   By: Claudie Revering M.D.   On: 07/18/2022  17:45   MR CARDIAC MORPHOLOGY W WO CONTRAST  Result Date: 07/17/2022 CLINICAL DATA:  89M with CAD, ICM, ESRD, LV thrombus, AAA, RCC s/p partial nephrectomy, T2DM. Cath at [facility] showed occluded mid LAD and severe disease in mid LCX. Echo shows EF 30-35%, apical thrombus. CMR to assess viability EXAM: CARDIAC MRI TECHNIQUE: The patient was scanned on a 1.5 Tesla Siemens magnet. A dedicated cardiac coil was used. Functional imaging was done using Fiesta sequences. 2,3, and 4 chamber views were done to assess for RWMA's. Modified Simpson's rule using a short axis stack was used to calculate an ejection fraction on a dedicated work Conservation officer, nature. The patient received 10 cc of Gadavist. After 10 minutes inversion recovery sequences were used to assess for infiltration and scar tissue. CONTRAST:  10 cc  of Gadavist FINDINGS: Left ventricle: -Severe dilatation -Severe systolic dysfunction. Thinning/akinesis of mid to apical anterior/anteroseptal walls, apical inferior/lateral wall. Aneurysm of apex -LV apical thrombus measures 47m x 780m-Mild ECV elevation (30%) -Normal T2 values -Subendocardial LGE consistent with prior infarct in mid anterior/anteroseptal, apical anterior/septal/inferior/lateral walls, and apex. LGE is greater than 50% transmural suggesting nonviability in apical anterior/septal/inferior/lateral walls and apex. LGE <50% transmural in mid anterior wall but wall thickness <4.50m26muggests nonviability. Findings are consistent with nonviability in mid to distal LAD territory LV EF: 24% (Normal 56-78%) Absolute volumes: LV EDV: 346m10mormal 77-195 mL) LV ESV: 263mL106mrmal 19-72 mL) LV SV: 83mL 250mmal 51-133 mL) CO: 6.3L/min (Normal 2.8-8.8 L/min) Indexed volumes: LV EDV: 1850mL/s41m(Normal 47-92 mL/sq-m) LV ESV: 140mL/sq14mNormal 13-30 mL/sq-m) LV SV: 44mL/sq-58mormal 32-62 mL/sq-m) CI: 3.4L/min/sq-m (Normal 1.7-4.2 L/min/sq-m) Right ventricle: Normal size and systolic function RV EF:   48% (Normal 47-74%) Absolute volumes: RV EDV: 173mL (Nor7m88-227 mL) RV ESV: 89mL (Norm66m3-103 mL) RV SV: 84mL (Norma63m-138 mL) CO: 6.4L/min (Normal 2.8-8.8 L/min) Indexed volumes: RV EDV: 92mL/sq-m (N350ml 55-105 mL/sq-m) RV ESV: 48mL/sq-m (No24m 15-43 mL/sq-m) RV SV: 450mL/sq-m (Nor88m32-64 mL/sq-m) CI: 3.4L/min/sq-m (Normal 1.7-4.2 L/min/sq-m) Left atrium: Mild enlargement Right atrium: Normal size Mitral valve: Mild regurgitation Aortic valve: Tricuspid.  Mild regurgitation Tricuspid valve: Mild regurgitation Pulmonic valve: No regurgitation Aorta: Normal proximal ascending aorta Pericardium: Normal Extracardiac structures: Diffuse pulmonary opacities and bilateral pleural effusions (moderate on right, small on left), consider CT chest for further evaluation IMPRESSION: 1. Subendocardial LGE consistent with prior infarct in mid anterior/anteroseptal, apical anterior/septal/inferior/lateral walls, and apex. LGE is greater than 50% transmural suggesting nonviability in apical anterior/septal/inferior/lateral walls and apex. LGE <50% transmural in mid anterior wall but wall thickness <4.50mm suggests no28mability. Findings are consistent with nonviability in mid to distal LAD territory 2. Severe LV dilatation with severe systolic dysfunction (EF 24%). Thinning/a00%esis of mid to apical anterior/anteroseptal walls and apical inferior/lateral walls. Aneurysm of apex 3.  LV apical thrombus measures 11mm x 7mm 4.  N49ml RV3mze and systolic function (EF 48%) 5. Diffuse pu92%nary opacities and bilateral pleural effusions (moderate on right, small on left), consider CT chest for further evaluation Electronically Signed   By: Christopher  SchumOswaldo Milian2023 15:09   VAS US LOWER EXT ART [place] MULTI (SEGMENTALS & LE RAYNAUDS)  Result Date: 07/11/2022  LOWER EXTREMITY DOPPLER STUDY Patient Name:  Deanthony T Hendershott  KODEY XUE10/06/2024 Medical Rec #: [id number]         [id number] #:    [phone] Date  [id number]/28/1952        1952/05/01nder: M Patient Age:   70 years Exam Loca74on:  [facility] Procedure:      VAS US LOWER EXT ART [place] MULTI (SEGMENTALS & LE RAYNAUDS) Referring Phys: HAO MENG --------------------------------------------------------------------------------  Indications: Patient describes severe bilateral leg pain from the upper thighs              all the way down to the foot. Occasional mottling and bluish              discoloration. The leg pain started mainly in August after [facility]              [facility]. [place]: Hyperlipidemia, Diabetes, past history of smoking, prior MI,                    coronary  artery disease.  Performing Technologist: Mariane Masters RVT  Examination Guidelines: A complete evaluation includes at minimum, Doppler waveform signals and systolic blood pressure reading at the level of bilateral brachial, anterior tibial, and posterior tibial arteries, when vessel segments are accessible. Bilateral testing is considered an integral part of a complete examination. Photoelectric Plethysmograph (PPG) waveforms and toe systolic pressure readings are included as required and additional duplex testing as needed. Limited examinations for reoccurring indications may be performed as noted.  ABI Findings: +---------+------------------+-----+---------+--------+ Right    Rt Pressure (mmHg)IndexWaveform Comment  +---------+------------------+-----+---------+--------+ Brachial 146                                      +---------+------------------+-----+---------+--------+ CFA                             triphasic         +---------+------------------+-----+---------+--------+ Popliteal                       triphasic         +---------+------------------+-----+---------+--------+ PTA      171               1.17 triphasic         +---------+------------------+-----+---------+--------+ PERO     179               1.23 biphasic           +---------+------------------+-----+---------+--------+ DP       179               1.23 triphasic         +---------+------------------+-----+---------+--------+ Great Toe111               0.76 Normal            +---------+------------------+-----+---------+--------+ +---------+------------------+-----+---------+------------------------+ Left     Lt Pressure (mmHg)IndexWaveform Comment                  +---------+------------------+-----+---------+------------------------+ Brachial 145                                                      +---------+------------------+-----+---------+------------------------+ CFA                             triphasic                         +---------+------------------+-----+---------+------------------------+ Popliteal                       triphasic                         +---------+------------------+-----+---------+------------------------+ PTA      183               1.25 triphasic                         +---------+------------------+-----+---------+------------------------+ PERO  biphasic                          +---------+------------------+-----+---------+------------------------+ DP       181               1.24 biphasic                          +---------+------------------+-----+---------+------------------------+ Great Toe106               0.73 Normal   diminished PPG amplitude +---------+------------------+-----+---------+------------------------+ +-------+-----------+-----------+------------+------------+ ABI/TBIToday's ABIToday's TBIPrevious ABIPrevious TBI +-------+-----------+-----------+------------+------------+ Right  1.23       0.76                                +-------+-----------+-----------+------------+------------+ Left   1.25       0.73                                +-------+-----------+-----------+------------+------------+  Summary: Right: Resting  right ankle-brachial index is within normal range. The right toe-brachial index is normal. Left: Resting left ankle-brachial index is within normal range. The left toe-brachial index is normal. *See table(s) above for measurements and observations.  Electronically signed by Jenkins Rouge MD on 07/11/2022 at 2:14:42 PM.    Final    DG Chest 2 View  Result Date: 07/09/2022 CLINICAL DATA:  Shortness of breath for 1 week.  Dialysis patient. EXAM: CHEST - 2 VIEW COMPARISON:  06/19/2022 FINDINGS: Heart size remains within normal limits. New right jugular central venous dialysis catheter is seen, with tip overlying the proximal right atrium. No evidence of pneumothorax. Mixed interstitial and airspace disease is again seen bilaterally, without significant change, and most likely due to pulmonary edema. Small right pleural effusion is also stable. IMPRESSION: New right jugular central venous dialysis catheter in appropriate position. No evidence of pneumothorax. No significant change in diffuse interstitial and airspace disease, likely due to pulmonary edema. Stable small right pleural effusion. Electronically Signed   By: Marlaine Hind M.D.   On: 07/09/2022 12:34    Microbiology: No results found for this or any previous visit (from the past 240 hour(s)).   Labs: Basic Metabolic Panel: Recent Labs  Lab 07/28/22 0642 07/29/22 0346 07/30/22 0726 07/31/22 0131 08/01/22 0137  NA 137 135 135 138 136  K 5.4* 4.5 5.0 4.4 4.4  CL 91* 95* 95* 98 97*  CO2 22 24 21* 25 22  GLUCOSE 131* 96 98 87 132*  BUN 92* 46* 83* 46* 71*  CREATININE 6.73* 4.10* 5.85* 3.87* 5.36*  CALCIUM 6.8* 7.2* 7.1* 7.2* 7.1*  PHOS  --   --  8.2*  --   --    Liver Function Tests: Recent Labs  Lab 07/27/22 0456 07/30/22 0726  AST 24  --   ALT 14  --   ALKPHOS 66  --   BILITOT 1.3*  --   PROT 5.6*  --   ALBUMIN 1.9* 1.8*   No results for input(s): "LIPASE", "AMYLASE" in the last 168 hours. No results for input(s): "AMMONIA"  in the last 168 hours. CBC: Recent Labs  Lab 07/29/22 0346 07/30/22 0137 07/30/22 0726 07/31/22 0131 08/01/22 0137  WBC 15.5* 12.3* 11.3* 9.8 11.9*  HGB 8.1* 7.8* 8.0* 8.0* 8.4*  HCT 24.7* 22.4* 23.7* 23.8* 24.4*  MCV 91.1  89.2 90.8 89.8 88.7  PLT 204 172 172 146* 141*   Cardiac Enzymes: No results for input(s): "CKTOTAL", "CKMB", "CKMBINDEX", "TROPONINI" in the last 168 hours. BNP: BNP (last 3 results) Recent Labs    06/19/22 0301 07/18/22 1653  BNP 1,453.6* 2,398.5*    ProBNP (last 3 results) No results for input(s): "PROBNP" in the last 8760 hours.  CBG: Recent Labs  Lab 07/31/22 2159 07/31/22 2231 08/01/22 0800 08/01/22 1247 08/01/22 1552  GLUCAP 140* 211* 124* 132* 114*       Signed:  Domenic Polite MD.  [facility] 08/02/2022, 10:13 AM

## 2022-08-02 NOTE — Progress Notes (Signed)
Advised by renal NP that pt will d/c to home with hospice today. Contacted [facility] to advise clinic of above information.   Melven Sartorius Renal Navigator [phone]

## 2022-08-02 NOTE — Procedures (Signed)
Interventional Radiology Procedure Note  PROCEDURE SUMMARY:  Successful removal of tunneled catheter.  No complications.   EBL = trace  Please see full dictation in imaging section of Epic for procedure details.    Narda Rutherford, AGNP-BC 08/02/2022, 1:12 PM

## 2022-08-17 ENCOUNTER — Ambulatory Visit: Payer: No Typology Code available for payment source | Admitting: General Practice

## 2022-09-02 DEATH — deceased

## 2022-11-02 ENCOUNTER — Ambulatory Visit: Payer: No Typology Code available for payment source | Admitting: Cardiovascular Disease
